# Patient Record
Sex: Female | Born: 1935 | Race: Black or African American | Hispanic: No | Smoking: Never smoker
Health system: Southern US, Community
[De-identification: ages and names within clinical notes are randomized; demographics above are authoritative.]

## PROBLEM LIST (undated history)

## (undated) DIAGNOSIS — R05 Cough: Secondary | ICD-10-CM

## (undated) DIAGNOSIS — R053 Chronic cough: Secondary | ICD-10-CM

## (undated) DIAGNOSIS — G459 Transient cerebral ischemic attack, unspecified: Secondary | ICD-10-CM

## (undated) DIAGNOSIS — I10 Essential (primary) hypertension: Secondary | ICD-10-CM

## (undated) DIAGNOSIS — E785 Hyperlipidemia, unspecified: Secondary | ICD-10-CM

## (undated) DIAGNOSIS — I4510 Unspecified right bundle-branch block: Secondary | ICD-10-CM

## (undated) DIAGNOSIS — N185 Chronic kidney disease, stage 5: Secondary | ICD-10-CM

## (undated) DIAGNOSIS — D649 Anemia, unspecified: Secondary | ICD-10-CM

## (undated) DIAGNOSIS — I480 Paroxysmal atrial fibrillation: Secondary | ICD-10-CM

## (undated) DIAGNOSIS — N186 End stage renal disease: Secondary | ICD-10-CM

## (undated) DIAGNOSIS — M1990 Unspecified osteoarthritis, unspecified site: Secondary | ICD-10-CM

## (undated) DIAGNOSIS — I272 Pulmonary hypertension, unspecified: Secondary | ICD-10-CM

## (undated) DIAGNOSIS — D303 Benign neoplasm of bladder: Secondary | ICD-10-CM

## (undated) DIAGNOSIS — I951 Orthostatic hypotension: Secondary | ICD-10-CM

## (undated) DIAGNOSIS — G4733 Obstructive sleep apnea (adult) (pediatric): Secondary | ICD-10-CM

## (undated) DIAGNOSIS — J189 Pneumonia, unspecified organism: Secondary | ICD-10-CM

## (undated) DIAGNOSIS — I4891 Unspecified atrial fibrillation: Secondary | ICD-10-CM

## (undated) DIAGNOSIS — Z992 Dependence on renal dialysis: Secondary | ICD-10-CM

## (undated) DIAGNOSIS — I639 Cerebral infarction, unspecified: Secondary | ICD-10-CM

## (undated) DIAGNOSIS — N3289 Other specified disorders of bladder: Secondary | ICD-10-CM

## (undated) DIAGNOSIS — I350 Nonrheumatic aortic (valve) stenosis: Secondary | ICD-10-CM

## (undated) HISTORY — DX: Hyperlipidemia, unspecified: E78.5

## (undated) HISTORY — PX: TOTAL ABDOMINAL HYSTERECTOMY: SHX209

## (undated) HISTORY — DX: Chronic cough: R05.3

## (undated) HISTORY — DX: Pulmonary hypertension, unspecified: I27.20

## (undated) HISTORY — DX: Other specified disorders of bladder: N32.89

## (undated) HISTORY — PX: NEPHRECTOMY: SHX65

## (undated) HISTORY — DX: Transient cerebral ischemic attack, unspecified: G45.9

## (undated) HISTORY — DX: Chronic kidney disease, stage 5: N18.5

## (undated) HISTORY — DX: End stage renal disease: N18.6

## (undated) HISTORY — DX: Essential (primary) hypertension: I10

## (undated) HISTORY — DX: Nonrheumatic aortic (valve) stenosis: I35.0

## (undated) HISTORY — DX: Unspecified atrial fibrillation: I48.91

## (undated) HISTORY — DX: Paroxysmal atrial fibrillation: I48.0

## (undated) HISTORY — DX: Obstructive sleep apnea (adult) (pediatric): G47.33

## (undated) HISTORY — DX: Anemia, unspecified: D64.9

## (undated) HISTORY — PX: OTHER SURGICAL HISTORY: SHX169

## (undated) HISTORY — DX: Unspecified right bundle-branch block: I45.10

## (undated) HISTORY — DX: Cough: R05

## (undated) HISTORY — DX: End stage renal disease: Z99.2

## (undated) HISTORY — DX: Cerebral infarction, unspecified: I63.9

## (undated) HISTORY — DX: Benign neoplasm of bladder: D30.3

## (undated) HISTORY — PX: BONE MARROW BIOPSY: SHX199

## (undated) HISTORY — DX: Unspecified osteoarthritis, unspecified site: M19.90

## (undated) HISTORY — PX: COLONOSCOPY W/ BIOPSIES AND POLYPECTOMY: SHX1376

## (undated) HISTORY — PX: CATARACT EXTRACTION W/ INTRAOCULAR LENS  IMPLANT, BILATERAL: SHX1307

---

## 1998-03-22 ENCOUNTER — Encounter: Payer: Self-pay | Admitting: Emergency Medicine

## 1998-03-22 ENCOUNTER — Emergency Department (HOSPITAL_COMMUNITY): Admission: EM | Admit: 1998-03-22 | Discharge: 1998-03-22 | Payer: Self-pay | Admitting: Emergency Medicine

## 1998-04-12 ENCOUNTER — Encounter: Payer: Self-pay | Admitting: Internal Medicine

## 1998-04-12 ENCOUNTER — Inpatient Hospital Stay (HOSPITAL_COMMUNITY): Admission: EM | Admit: 1998-04-12 | Discharge: 1998-04-17 | Payer: Self-pay | Admitting: Emergency Medicine

## 1998-04-13 ENCOUNTER — Encounter: Payer: Self-pay | Admitting: Emergency Medicine

## 1998-04-13 ENCOUNTER — Encounter: Payer: Self-pay | Admitting: Internal Medicine

## 1998-04-15 ENCOUNTER — Encounter: Payer: Self-pay | Admitting: Urology

## 1998-04-16 ENCOUNTER — Encounter: Payer: Self-pay | Admitting: Internal Medicine

## 1999-04-13 ENCOUNTER — Other Ambulatory Visit: Admission: RE | Admit: 1999-04-13 | Discharge: 1999-04-13 | Payer: Self-pay | Admitting: Internal Medicine

## 1999-10-01 ENCOUNTER — Encounter: Payer: Self-pay | Admitting: Internal Medicine

## 1999-10-01 ENCOUNTER — Encounter: Admission: RE | Admit: 1999-10-01 | Discharge: 1999-10-01 | Payer: Self-pay | Admitting: Internal Medicine

## 2000-11-01 ENCOUNTER — Encounter: Payer: Self-pay | Admitting: Internal Medicine

## 2000-11-01 ENCOUNTER — Encounter: Admission: RE | Admit: 2000-11-01 | Discharge: 2000-11-01 | Payer: Self-pay | Admitting: Internal Medicine

## 2001-04-16 ENCOUNTER — Emergency Department (HOSPITAL_COMMUNITY): Admission: EM | Admit: 2001-04-16 | Discharge: 2001-04-16 | Payer: Self-pay

## 2001-11-02 ENCOUNTER — Encounter: Payer: Self-pay | Admitting: Internal Medicine

## 2001-11-02 ENCOUNTER — Encounter: Admission: RE | Admit: 2001-11-02 | Discharge: 2001-11-02 | Payer: Self-pay | Admitting: Internal Medicine

## 2002-01-16 ENCOUNTER — Encounter: Admission: RE | Admit: 2002-01-16 | Discharge: 2002-01-16 | Payer: Self-pay | Admitting: Internal Medicine

## 2002-01-16 ENCOUNTER — Encounter: Payer: Self-pay | Admitting: Internal Medicine

## 2002-04-29 ENCOUNTER — Other Ambulatory Visit: Admission: RE | Admit: 2002-04-29 | Discharge: 2002-04-29 | Payer: Self-pay | Admitting: Internal Medicine

## 2002-05-16 ENCOUNTER — Encounter: Payer: Self-pay | Admitting: Internal Medicine

## 2002-05-16 ENCOUNTER — Encounter: Admission: RE | Admit: 2002-05-16 | Discharge: 2002-05-16 | Payer: Self-pay | Admitting: Internal Medicine

## 2002-07-03 ENCOUNTER — Ambulatory Visit (HOSPITAL_COMMUNITY): Admission: RE | Admit: 2002-07-03 | Discharge: 2002-07-03 | Payer: Self-pay | Admitting: Urology

## 2002-07-03 ENCOUNTER — Encounter: Payer: Self-pay | Admitting: Urology

## 2002-07-11 ENCOUNTER — Encounter (INDEPENDENT_AMBULATORY_CARE_PROVIDER_SITE_OTHER): Payer: Self-pay | Admitting: Specialist

## 2002-07-11 ENCOUNTER — Ambulatory Visit (HOSPITAL_BASED_OUTPATIENT_CLINIC_OR_DEPARTMENT_OTHER): Admission: RE | Admit: 2002-07-11 | Discharge: 2002-07-11 | Payer: Self-pay | Admitting: Urology

## 2003-08-12 ENCOUNTER — Encounter: Admission: RE | Admit: 2003-08-12 | Discharge: 2003-08-12 | Payer: Self-pay | Admitting: Internal Medicine

## 2004-12-13 ENCOUNTER — Encounter: Admission: RE | Admit: 2004-12-13 | Discharge: 2004-12-13 | Payer: Self-pay | Admitting: Internal Medicine

## 2004-12-13 ENCOUNTER — Ambulatory Visit: Payer: Self-pay

## 2004-12-15 ENCOUNTER — Encounter (INDEPENDENT_AMBULATORY_CARE_PROVIDER_SITE_OTHER): Payer: Self-pay | Admitting: *Deleted

## 2004-12-15 ENCOUNTER — Inpatient Hospital Stay (HOSPITAL_COMMUNITY): Admission: EM | Admit: 2004-12-15 | Discharge: 2004-12-25 | Payer: Self-pay | Admitting: Emergency Medicine

## 2004-12-23 ENCOUNTER — Ambulatory Visit: Payer: Self-pay | Admitting: Oncology

## 2004-12-23 ENCOUNTER — Encounter: Payer: Self-pay | Admitting: Cardiology

## 2004-12-23 ENCOUNTER — Ambulatory Visit: Payer: Self-pay | Admitting: Cardiology

## 2005-02-04 ENCOUNTER — Encounter (INDEPENDENT_AMBULATORY_CARE_PROVIDER_SITE_OTHER): Payer: Self-pay | Admitting: Specialist

## 2005-02-04 ENCOUNTER — Ambulatory Visit: Payer: Self-pay | Admitting: Oncology

## 2005-02-04 ENCOUNTER — Ambulatory Visit (HOSPITAL_COMMUNITY): Admission: RE | Admit: 2005-02-04 | Discharge: 2005-02-04 | Payer: Self-pay | Admitting: Oncology

## 2005-02-11 ENCOUNTER — Ambulatory Visit: Payer: Self-pay | Admitting: Oncology

## 2005-02-11 ENCOUNTER — Ambulatory Visit (HOSPITAL_COMMUNITY): Admission: RE | Admit: 2005-02-11 | Discharge: 2005-02-11 | Payer: Self-pay | Admitting: Vascular Surgery

## 2005-03-14 ENCOUNTER — Ambulatory Visit: Payer: Self-pay | Admitting: Cardiology

## 2005-03-28 ENCOUNTER — Ambulatory Visit: Payer: Self-pay

## 2005-04-04 ENCOUNTER — Ambulatory Visit: Payer: Self-pay

## 2005-05-11 ENCOUNTER — Ambulatory Visit: Payer: Self-pay | Admitting: Oncology

## 2005-05-23 ENCOUNTER — Ambulatory Visit (HOSPITAL_COMMUNITY): Admission: RE | Admit: 2005-05-23 | Discharge: 2005-05-23 | Payer: Self-pay | Admitting: Nephrology

## 2005-06-10 ENCOUNTER — Ambulatory Visit (HOSPITAL_COMMUNITY): Admission: RE | Admit: 2005-06-10 | Discharge: 2005-06-10 | Payer: Self-pay | Admitting: Vascular Surgery

## 2005-08-05 ENCOUNTER — Ambulatory Visit: Payer: Self-pay | Admitting: Oncology

## 2005-09-07 ENCOUNTER — Encounter: Payer: Self-pay | Admitting: Internal Medicine

## 2005-09-19 ENCOUNTER — Ambulatory Visit (HOSPITAL_COMMUNITY): Admission: RE | Admit: 2005-09-19 | Discharge: 2005-09-19 | Payer: Self-pay | Admitting: Nephrology

## 2005-10-10 ENCOUNTER — Ambulatory Visit (HOSPITAL_COMMUNITY): Admission: RE | Admit: 2005-10-10 | Discharge: 2005-10-10 | Payer: Self-pay | Admitting: Vascular Surgery

## 2005-11-02 ENCOUNTER — Ambulatory Visit: Payer: Self-pay | Admitting: Oncology

## 2006-01-23 ENCOUNTER — Encounter: Admission: RE | Admit: 2006-01-23 | Discharge: 2006-01-23 | Payer: Self-pay | Admitting: Nephrology

## 2006-03-03 ENCOUNTER — Ambulatory Visit: Payer: Self-pay | Admitting: Pulmonary Disease

## 2006-03-06 ENCOUNTER — Ambulatory Visit: Payer: Self-pay | Admitting: Pulmonary Disease

## 2006-04-03 ENCOUNTER — Ambulatory Visit: Payer: Self-pay | Admitting: Pulmonary Disease

## 2006-10-11 ENCOUNTER — Emergency Department (HOSPITAL_COMMUNITY): Admission: EM | Admit: 2006-10-11 | Discharge: 2006-10-11 | Payer: Self-pay | Admitting: Emergency Medicine

## 2006-10-13 ENCOUNTER — Ambulatory Visit: Payer: Self-pay | Admitting: Cardiology

## 2006-10-16 ENCOUNTER — Ambulatory Visit (HOSPITAL_COMMUNITY): Admission: RE | Admit: 2006-10-16 | Discharge: 2006-10-16 | Payer: Self-pay | Admitting: Nephrology

## 2006-10-31 ENCOUNTER — Encounter: Payer: Self-pay | Admitting: Cardiology

## 2006-10-31 ENCOUNTER — Ambulatory Visit: Payer: Self-pay

## 2006-10-31 ENCOUNTER — Ambulatory Visit: Payer: Self-pay | Admitting: Cardiology

## 2006-11-03 ENCOUNTER — Ambulatory Visit (HOSPITAL_COMMUNITY): Admission: RE | Admit: 2006-11-03 | Discharge: 2006-11-03 | Payer: Self-pay | Admitting: *Deleted

## 2006-11-03 ENCOUNTER — Ambulatory Visit: Payer: Self-pay | Admitting: *Deleted

## 2006-11-29 ENCOUNTER — Ambulatory Visit (HOSPITAL_COMMUNITY): Admission: RE | Admit: 2006-11-29 | Discharge: 2006-11-29 | Payer: Self-pay | Admitting: Nephrology

## 2006-12-05 ENCOUNTER — Emergency Department (HOSPITAL_COMMUNITY): Admission: EM | Admit: 2006-12-05 | Discharge: 2006-12-05 | Payer: Self-pay | Admitting: Emergency Medicine

## 2006-12-13 ENCOUNTER — Ambulatory Visit: Payer: Self-pay | Admitting: Vascular Surgery

## 2006-12-13 ENCOUNTER — Ambulatory Visit (HOSPITAL_COMMUNITY): Admission: RE | Admit: 2006-12-13 | Discharge: 2006-12-13 | Payer: Self-pay | Admitting: Vascular Surgery

## 2007-03-02 ENCOUNTER — Ambulatory Visit (HOSPITAL_COMMUNITY): Admission: RE | Admit: 2007-03-02 | Discharge: 2007-03-02 | Payer: Self-pay | Admitting: Nephrology

## 2007-03-23 ENCOUNTER — Encounter: Admission: RE | Admit: 2007-03-23 | Discharge: 2007-03-23 | Payer: Self-pay | Admitting: Internal Medicine

## 2007-07-21 ENCOUNTER — Emergency Department (HOSPITAL_COMMUNITY): Admission: EM | Admit: 2007-07-21 | Discharge: 2007-07-21 | Payer: Self-pay | Admitting: Family Medicine

## 2007-09-10 ENCOUNTER — Ambulatory Visit (HOSPITAL_COMMUNITY): Admission: RE | Admit: 2007-09-10 | Discharge: 2007-09-10 | Payer: Self-pay | Admitting: Nephrology

## 2007-10-06 ENCOUNTER — Ambulatory Visit (HOSPITAL_COMMUNITY): Admission: RE | Admit: 2007-10-06 | Discharge: 2007-10-06 | Payer: Self-pay | Admitting: Vascular Surgery

## 2007-10-06 ENCOUNTER — Ambulatory Visit: Payer: Self-pay | Admitting: Vascular Surgery

## 2007-10-17 ENCOUNTER — Encounter: Admission: RE | Admit: 2007-10-17 | Discharge: 2007-10-17 | Payer: Self-pay | Admitting: Nephrology

## 2008-02-06 ENCOUNTER — Ambulatory Visit: Payer: Self-pay | Admitting: Pulmonary Disease

## 2008-02-06 DIAGNOSIS — R011 Cardiac murmur, unspecified: Secondary | ICD-10-CM

## 2008-02-06 DIAGNOSIS — R05 Cough: Secondary | ICD-10-CM

## 2008-02-07 DIAGNOSIS — G459 Transient cerebral ischemic attack, unspecified: Secondary | ICD-10-CM | POA: Insufficient documentation

## 2008-02-07 DIAGNOSIS — D638 Anemia in other chronic diseases classified elsewhere: Secondary | ICD-10-CM

## 2008-02-07 DIAGNOSIS — E785 Hyperlipidemia, unspecified: Secondary | ICD-10-CM

## 2008-02-07 DIAGNOSIS — N189 Chronic kidney disease, unspecified: Secondary | ICD-10-CM | POA: Insufficient documentation

## 2008-02-15 ENCOUNTER — Encounter: Payer: Self-pay | Admitting: Pulmonary Disease

## 2008-02-15 ENCOUNTER — Ambulatory Visit: Payer: Self-pay

## 2008-02-20 ENCOUNTER — Ambulatory Visit: Payer: Self-pay | Admitting: Pulmonary Disease

## 2008-02-20 ENCOUNTER — Telehealth (INDEPENDENT_AMBULATORY_CARE_PROVIDER_SITE_OTHER): Payer: Self-pay

## 2008-02-25 ENCOUNTER — Encounter: Payer: Self-pay | Admitting: Pulmonary Disease

## 2008-03-02 ENCOUNTER — Encounter: Payer: Self-pay | Admitting: Pulmonary Disease

## 2008-03-02 ENCOUNTER — Ambulatory Visit (HOSPITAL_BASED_OUTPATIENT_CLINIC_OR_DEPARTMENT_OTHER): Admission: RE | Admit: 2008-03-02 | Discharge: 2008-03-02 | Payer: Self-pay | Admitting: Pulmonary Disease

## 2008-03-05 ENCOUNTER — Ambulatory Visit: Payer: Self-pay | Admitting: Pulmonary Disease

## 2008-03-13 ENCOUNTER — Ambulatory Visit: Payer: Self-pay | Admitting: Pulmonary Disease

## 2008-03-19 ENCOUNTER — Ambulatory Visit: Payer: Self-pay | Admitting: Pulmonary Disease

## 2008-03-19 DIAGNOSIS — G4733 Obstructive sleep apnea (adult) (pediatric): Secondary | ICD-10-CM

## 2008-03-24 ENCOUNTER — Encounter: Admission: RE | Admit: 2008-03-24 | Discharge: 2008-03-24 | Payer: Self-pay | Admitting: Internal Medicine

## 2008-03-26 ENCOUNTER — Ambulatory Visit (HOSPITAL_COMMUNITY): Admission: RE | Admit: 2008-03-26 | Discharge: 2008-03-26 | Payer: Self-pay | Admitting: Nephrology

## 2008-03-30 ENCOUNTER — Ambulatory Visit: Payer: Self-pay | Admitting: Surgery

## 2008-03-30 ENCOUNTER — Emergency Department (HOSPITAL_COMMUNITY): Admission: EM | Admit: 2008-03-30 | Discharge: 2008-03-30 | Payer: Self-pay | Admitting: Emergency Medicine

## 2008-04-21 ENCOUNTER — Ambulatory Visit: Payer: Self-pay | Admitting: Surgery

## 2008-04-25 ENCOUNTER — Ambulatory Visit (HOSPITAL_COMMUNITY): Admission: RE | Admit: 2008-04-25 | Discharge: 2008-04-25 | Payer: Self-pay | Admitting: Surgery

## 2008-06-09 ENCOUNTER — Ambulatory Visit: Payer: Self-pay | Admitting: Surgery

## 2008-07-14 ENCOUNTER — Ambulatory Visit: Payer: Self-pay | Admitting: Surgery

## 2008-07-16 ENCOUNTER — Ambulatory Visit (HOSPITAL_COMMUNITY): Admission: RE | Admit: 2008-07-16 | Discharge: 2008-07-16 | Payer: Self-pay | Admitting: Surgery

## 2008-07-16 ENCOUNTER — Ambulatory Visit: Payer: Self-pay | Admitting: Surgery

## 2008-08-18 ENCOUNTER — Ambulatory Visit: Payer: Self-pay | Admitting: Surgery

## 2008-08-22 ENCOUNTER — Ambulatory Visit: Payer: Self-pay | Admitting: Surgery

## 2008-08-22 ENCOUNTER — Ambulatory Visit (HOSPITAL_COMMUNITY): Admission: RE | Admit: 2008-08-22 | Discharge: 2008-08-22 | Payer: Self-pay | Admitting: Surgery

## 2008-09-15 ENCOUNTER — Ambulatory Visit: Payer: Self-pay | Admitting: Surgery

## 2008-10-13 ENCOUNTER — Ambulatory Visit: Payer: Self-pay | Admitting: Surgery

## 2008-10-27 IMAGING — CT CT HEAD W/O CM
1 series · 16 of 30 positions shown, 20 images · IV contrast (agent unspecified)
Comparison: None.

CLINICAL DATA: Left frontal headache status post fall yesterday.
 HEAD CT WITHOUT CONTRAST:
TECHNIQUE: Contiguous axial images were obtained from the base of the skull through the vertex according to standard protocol without contrast.

[Series 2: head_seq 4.5 h45s st · axial · 0.43mm/px · z∈[-147,-21]mm · 16 of 32 slices shown, 20 images]
[im 2/32  brain]
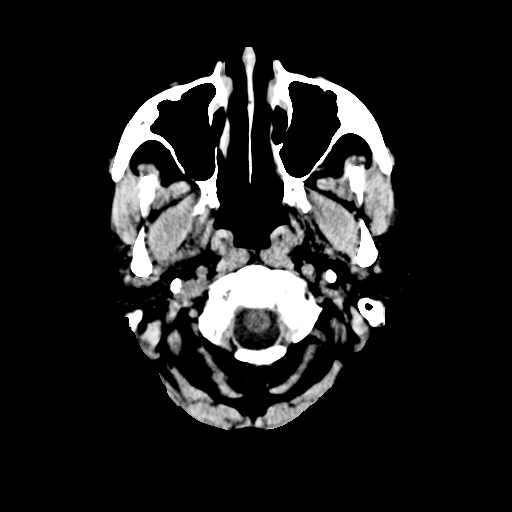
[im 2/32  bone]
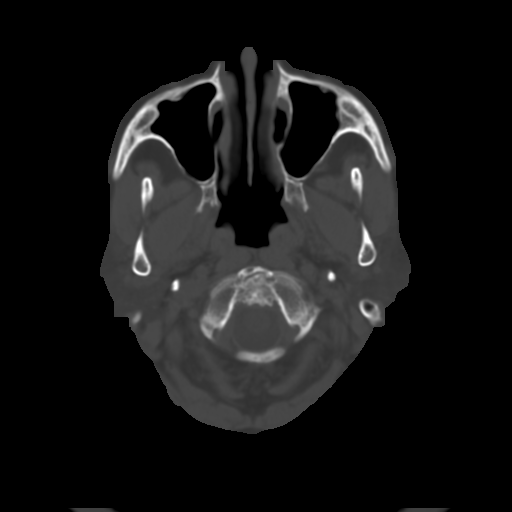
[im 4/32  brain]
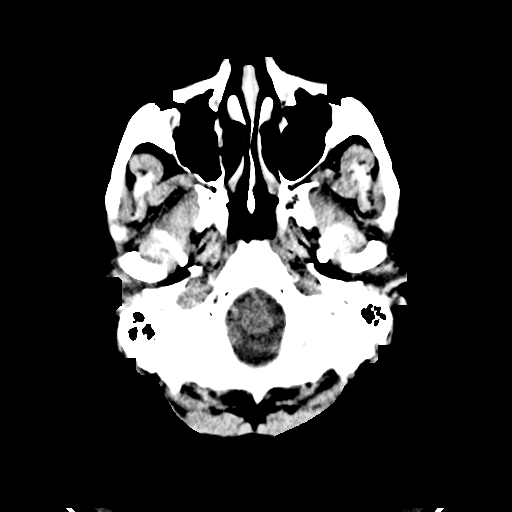
[im 6/32  brain]
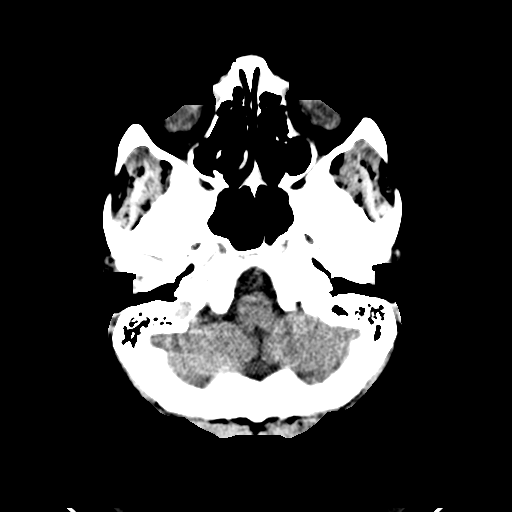
[im 8/32  brain]
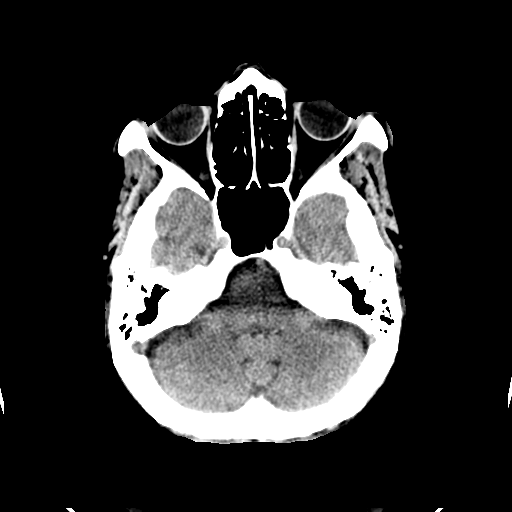
[im 9/32  brain]
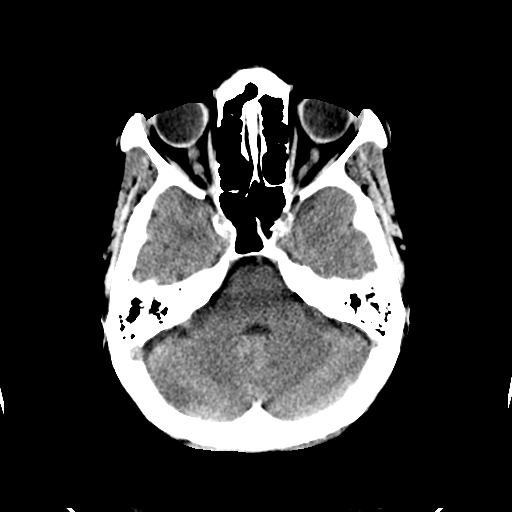
[im 9/32  bone]
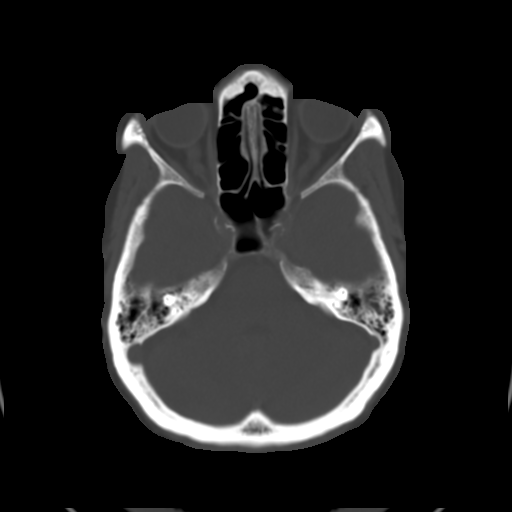
[im 11/32  brain]
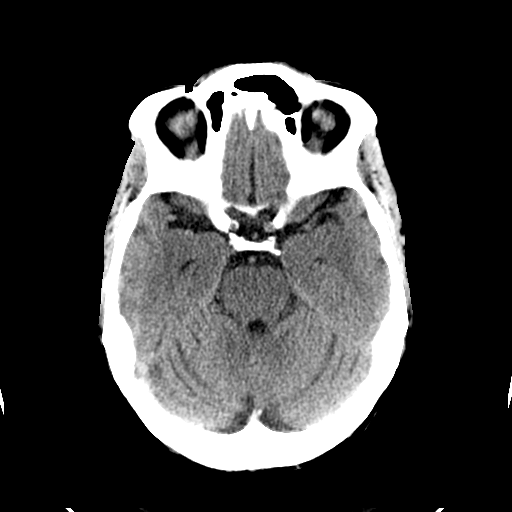
[im 13/32  brain]
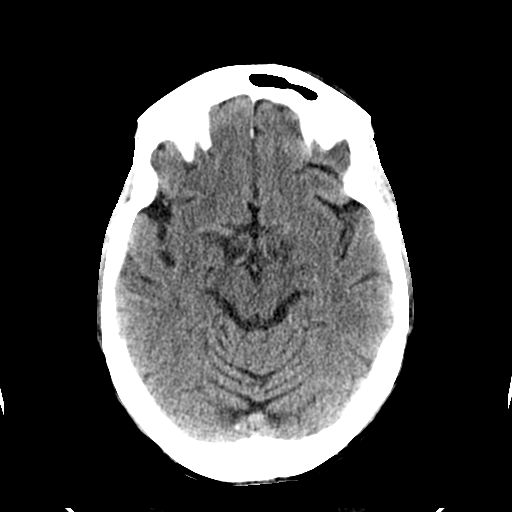
[im 15/32  brain]
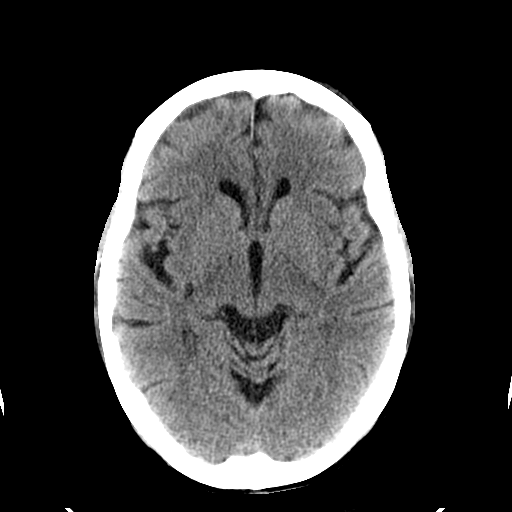
[im 17/32  brain]
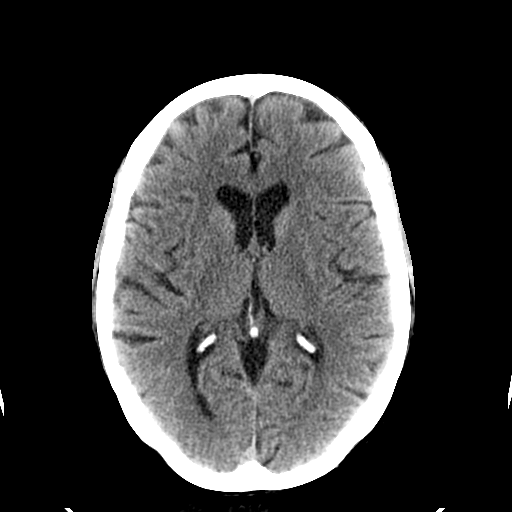
[im 17/32  bone]
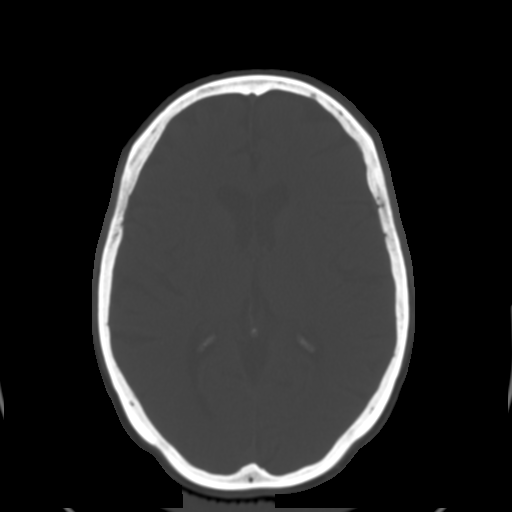
[im 19/32  brain]
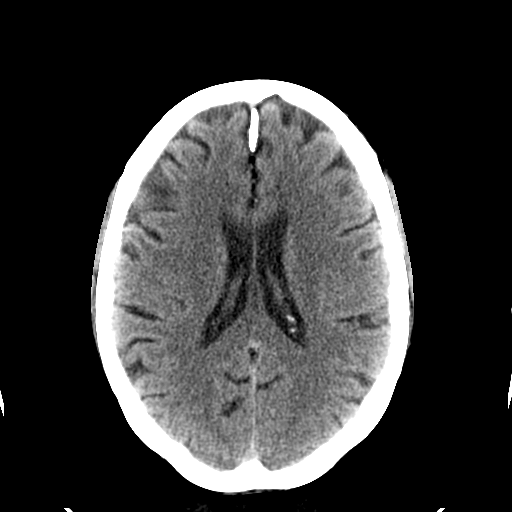
[im 21/32  brain]
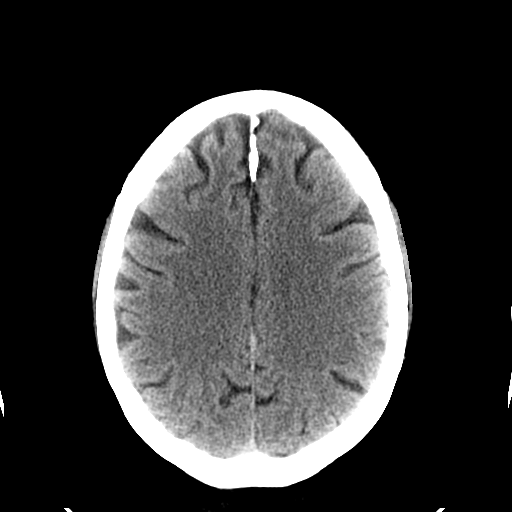
[im 23/32  brain]
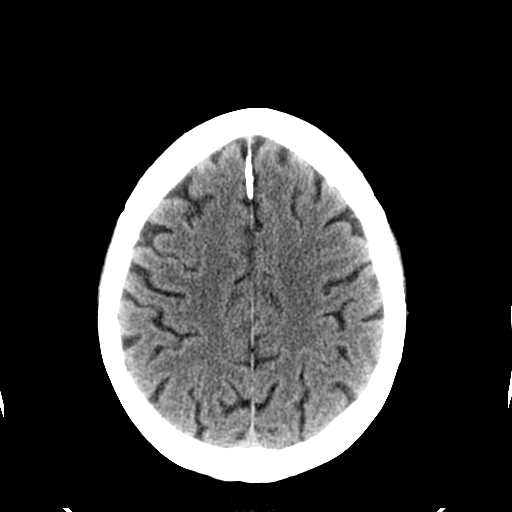
[im 24/32  brain]
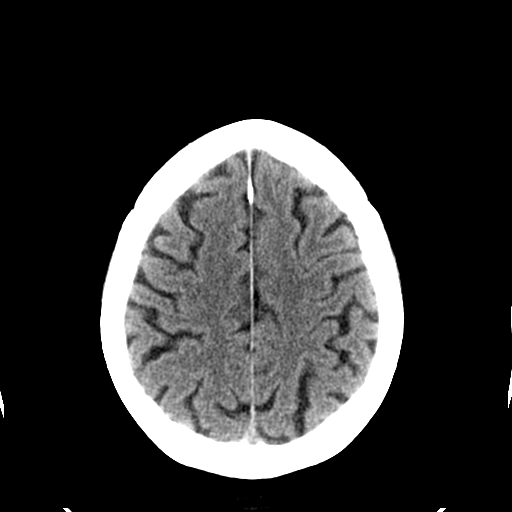
[im 24/32  bone]
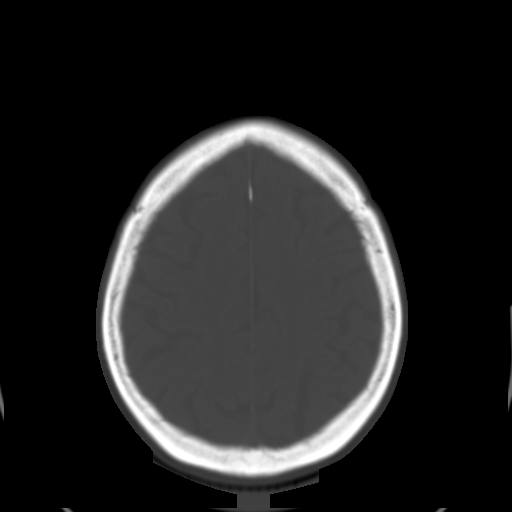
[im 26/32  brain]
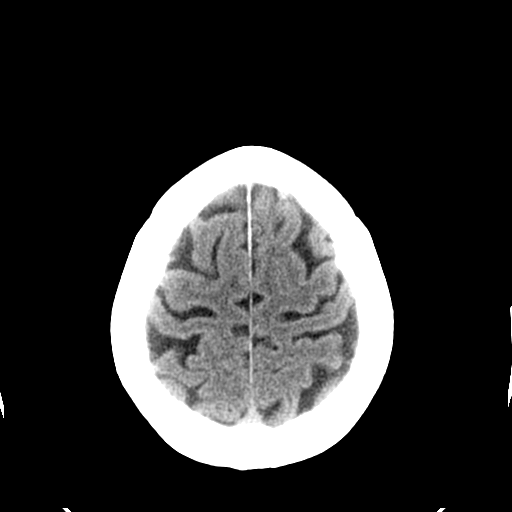
[im 28/32  brain]
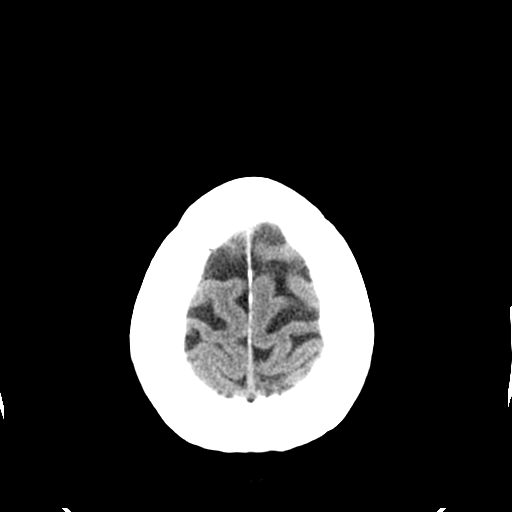
[im 30/32  brain]
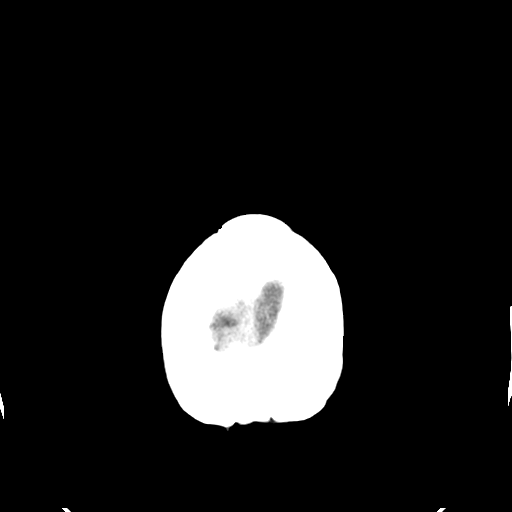

[16 of 30 positions shown; findings below may reference images not displayed]

FINDINGS: There appears to be some soft tissue swelling in the left frontotemporal scalp.  No acute intracranial hemorrhage, mass effect, or extraaxial fluid collections present.  There is mild generalized atrophy.  There is a possible small lacune or perivascular space in the right external capsule. No cortical stroke is demonstrated.  The calvarium is intact and the visualized paranasal sinuses are clear.
IMPRESSION: 1.  No evidence of acute intracranial or calvarial injury.
 2.  Left scalp soft tissue swelling.

## 2008-11-01 IMAGING — XA IR VENTRICULOPERITONEALSHUNT EVAL/INJ
1 series · 13 of 24 positions shown · non-contrast
Comparison: none

CLINICAL DATA: Decreased access flows.
LEFT UPPER EXTREMITY FISTULOGRAM:
PTA PROCEDURE:
Medications:  None. 
Procedure:  Informed consent was obtained.  The left upper extremity fistula was found to be patent by palpation.  It was accessed using 18-gauge angiocatheter towards the central venous structures.  Venogram images were obtained.

[Series 1: run · 13 of 57 slices shown]
[im 1/57]
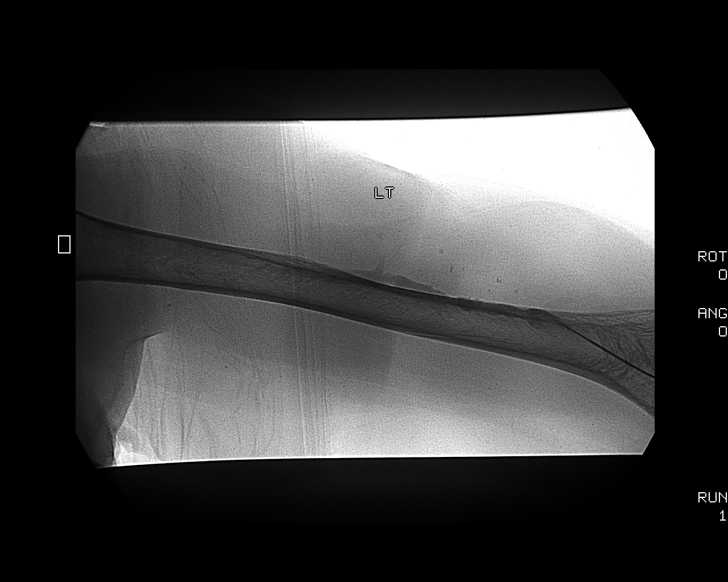
[im 5/57]
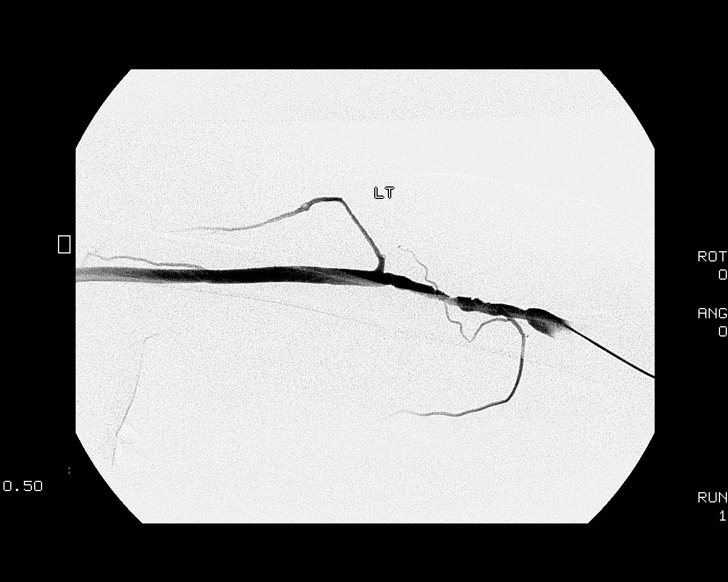
[im 10/57]
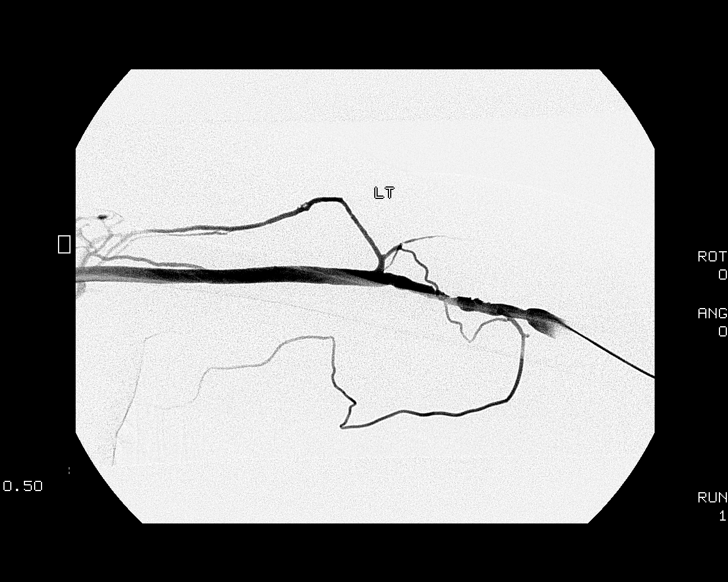
[im 15/57]
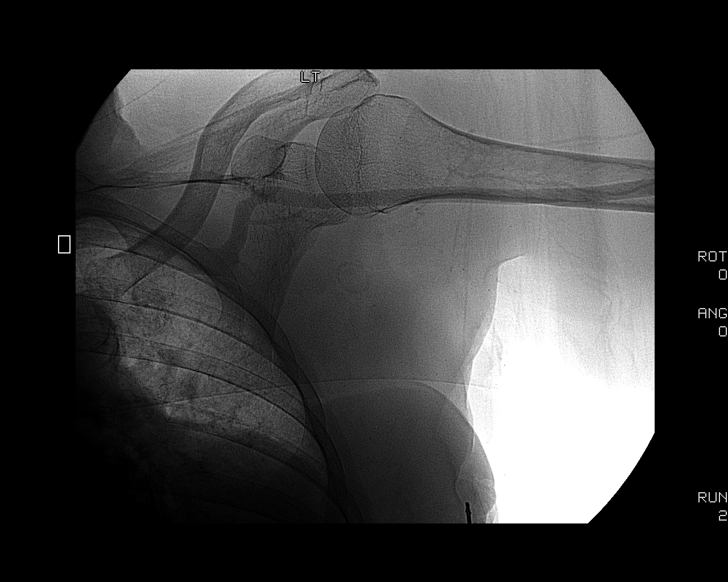
[im 20/57]
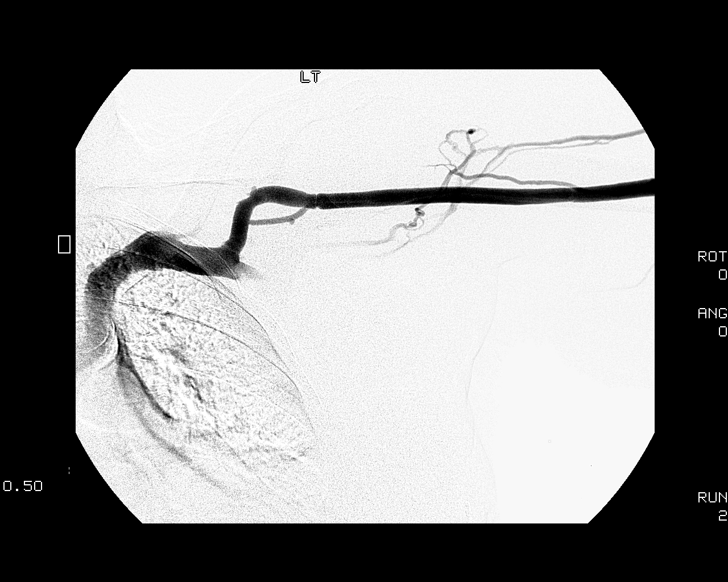
[im 25/57]
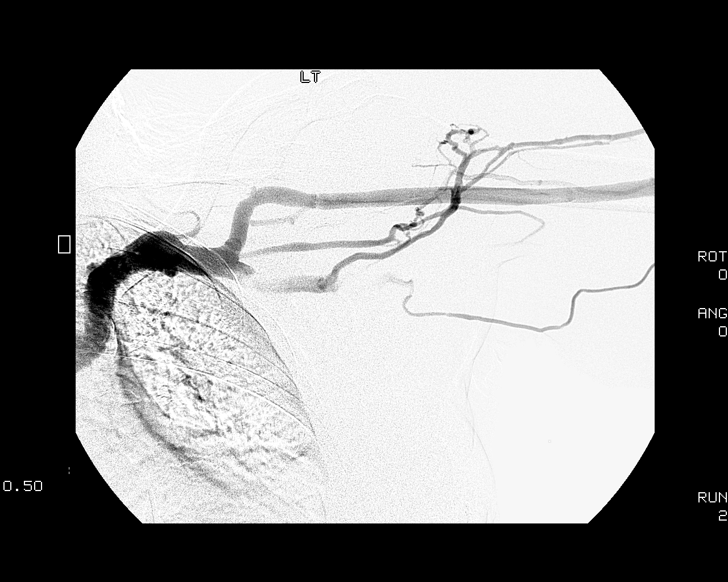
[im 30/57]
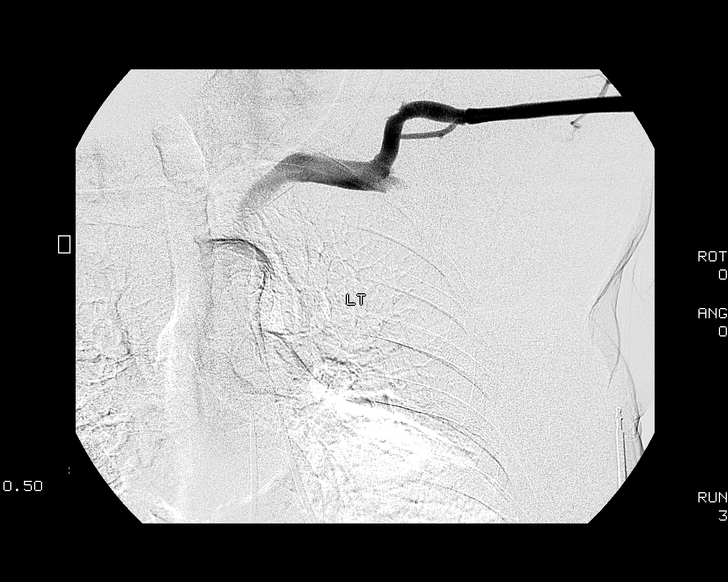
[im 32/57]
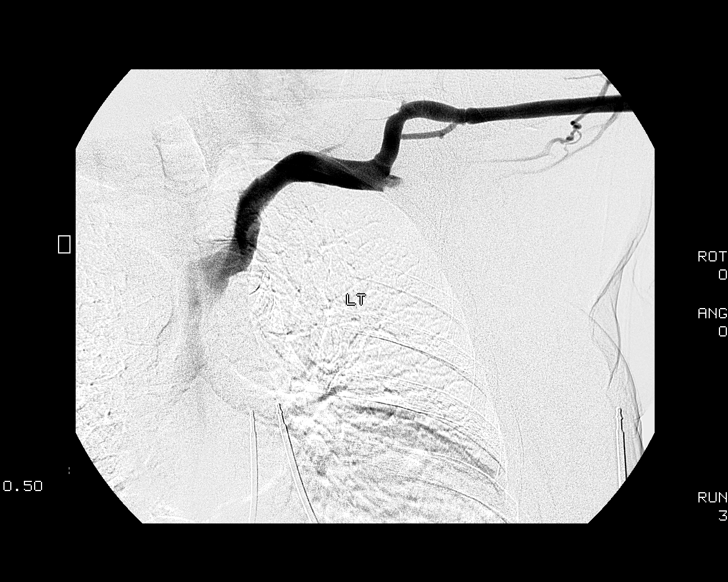
[im 37/57]
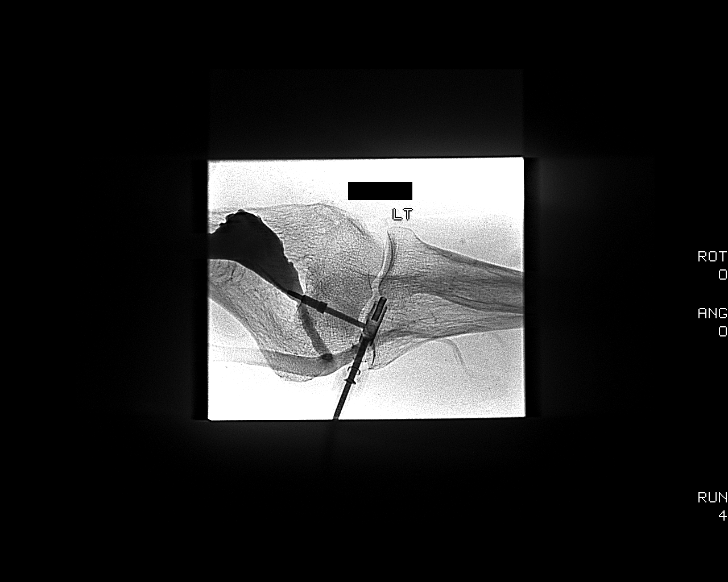
[im 42/57]
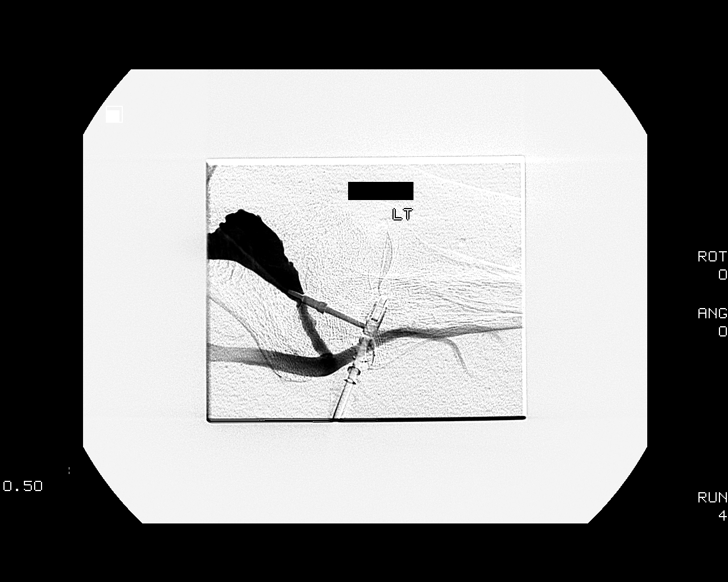
[im 47/57]
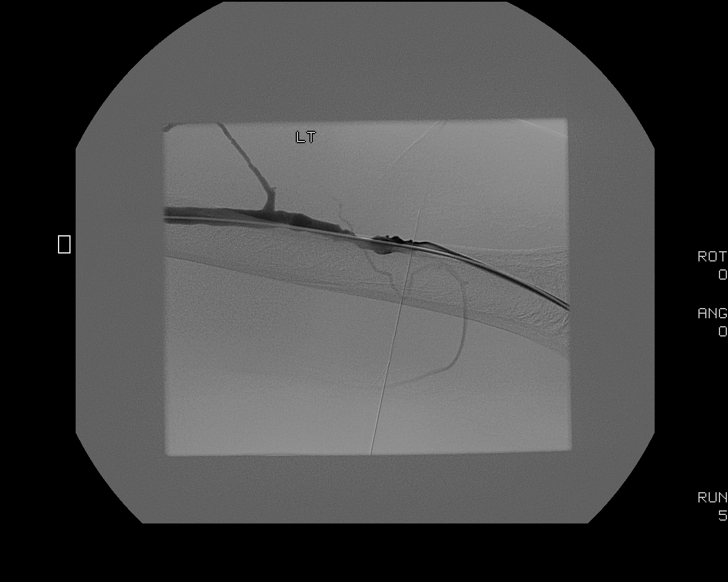
[im 52/57]
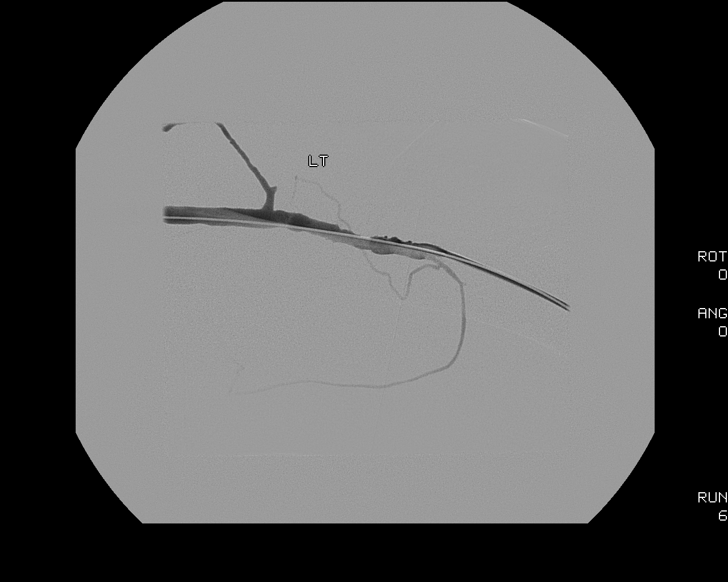
[im 57/57]
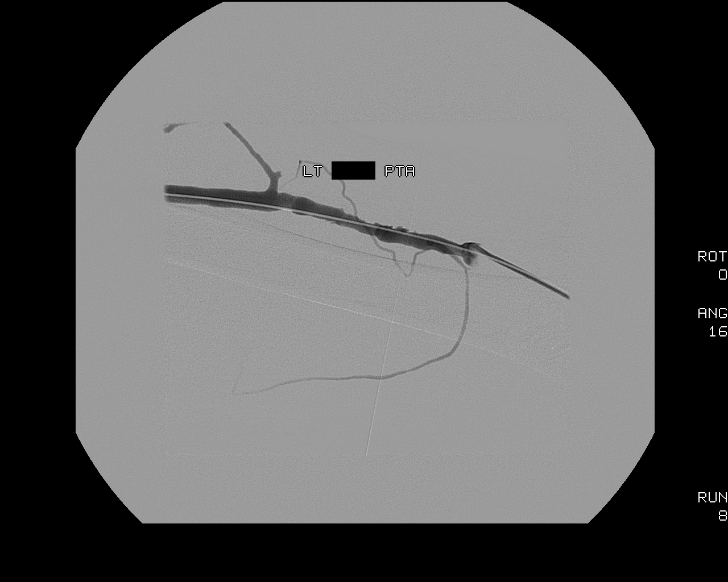

[13 of 24 positions shown; findings below may reference images not displayed]

FINDINGS: The left cephalic vein was patent, but there was an area of irregularity in the mid arm.  This was thought to be a mild to moderate stenosis, but due to the patient's poor access flow, this was thought to be a source of the dialysis problems.  The central venous structures were otherwise patent.  The fistula appeared to be widely patent.  There was a small aneurysmal formation a few centimeters beyond the anastomosis.  The initial access was upsized to a 5-French vascular sheath, and the area of irregularity and narrowing was angioplastied using a 5-mm Dorado balloon.  Follow-up images demonstrated improved flow through this vein.  The vascular sheath was removed using manual compression and a V+Pad.
IMPRESSION: Moderate narrowing and irregularity involving the cephalic vein in the mid arm.  This area was successfully treated with a 5-mm angioplasty balloon, and there was improved flow at the end of the procedure.

## 2008-11-26 ENCOUNTER — Ambulatory Visit: Payer: Self-pay | Admitting: Vascular Surgery

## 2008-12-05 ENCOUNTER — Ambulatory Visit (HOSPITAL_COMMUNITY): Admission: RE | Admit: 2008-12-05 | Discharge: 2008-12-05 | Payer: Self-pay | Admitting: Vascular Surgery

## 2008-12-05 ENCOUNTER — Ambulatory Visit: Payer: Self-pay | Admitting: Vascular Surgery

## 2008-12-22 ENCOUNTER — Ambulatory Visit (HOSPITAL_COMMUNITY): Admission: RE | Admit: 2008-12-22 | Discharge: 2008-12-22 | Payer: Self-pay | Admitting: Vascular Surgery

## 2009-02-02 ENCOUNTER — Ambulatory Visit: Payer: Self-pay | Admitting: Vascular Surgery

## 2009-02-02 ENCOUNTER — Ambulatory Visit (HOSPITAL_COMMUNITY): Admission: RE | Admit: 2009-02-02 | Discharge: 2009-02-02 | Payer: Self-pay | Admitting: Vascular Surgery

## 2009-02-10 ENCOUNTER — Ambulatory Visit (HOSPITAL_COMMUNITY): Admission: RE | Admit: 2009-02-10 | Discharge: 2009-02-10 | Payer: Self-pay | Admitting: Vascular Surgery

## 2009-02-11 ENCOUNTER — Ambulatory Visit (HOSPITAL_COMMUNITY): Admission: RE | Admit: 2009-02-11 | Discharge: 2009-02-11 | Payer: Self-pay | Admitting: Vascular Surgery

## 2009-04-20 ENCOUNTER — Encounter: Admission: RE | Admit: 2009-04-20 | Discharge: 2009-04-20 | Payer: Self-pay | Admitting: Internal Medicine

## 2009-05-04 ENCOUNTER — Encounter: Admission: RE | Admit: 2009-05-04 | Discharge: 2009-05-04 | Payer: Self-pay | Admitting: Internal Medicine

## 2010-02-17 ENCOUNTER — Ambulatory Visit (HOSPITAL_COMMUNITY): Admission: RE | Admit: 2010-02-17 | Discharge: 2010-02-17 | Payer: Self-pay | Admitting: Nephrology

## 2010-03-05 ENCOUNTER — Ambulatory Visit: Payer: Self-pay | Admitting: Internal Medicine

## 2010-03-26 ENCOUNTER — Ambulatory Visit: Payer: Self-pay | Admitting: Pulmonary Disease

## 2010-03-26 ENCOUNTER — Encounter: Payer: Self-pay | Admitting: Pulmonary Disease

## 2010-03-30 ENCOUNTER — Encounter: Payer: Self-pay | Admitting: Pulmonary Disease

## 2010-05-30 ENCOUNTER — Encounter: Payer: Self-pay | Admitting: Nephrology

## 2010-05-30 ENCOUNTER — Encounter: Payer: Self-pay | Admitting: Internal Medicine

## 2010-06-08 NOTE — Assessment & Plan Note (Signed)
Summary: rov/ mbw   Copy to:  Dr. Donato Heinz Primary Provider/Referring Provider:  Dr. [name]  CC:  followup on pfts, pt c/o dry cough no production  , no sob, and Back Pain.  History of Present Illness: 77 yo female with chronic cough.  She has persistence of her cough.  She tried flovent, but stopped.  She did not feel like this helped.    She feels the cough comes from her throat.  She does not have much sputum, nad denies hemoptysis.  She denies wheeze, fever, globus, sinus congestion, or GERD.  She does get some mucus in her throat at night.  PFT 03/26/10: FEV1 1.28(65%), FEV1% 71, positive bronchodilator response, TLC 3.79 (69%), DLCO 63%.  CXR  Procedure date:  03/05/2010  Findings:      CHEST - 2 VIEW   Comparison: [facility] [address] x-ray 10/17/2007, [facility] portable chest x-ray 03/30/2008 and two-view chest x-ray 04/25/2008.   Findings: Interval dialysis catheter removed from right IJ.  Stable slight cardiomegaly with left ventricular predominant configuration is seen.  No change in lateral left basilar linear scarring.  Lungs are otherwise clear.  Calcified thoracic aorta of normal caliber stable.  No change in degenerative findings dorsal spine. Mediastinum, hila, pleura are stable and unremarkable.   IMPRESSION:   1.  Interval dialysis catheter removal from right IJ since 04/25/2008. 2.  Stable slight cardiomegaly with left ventricular predominant configuration. 3.  No active cardiopulmonary disease.    Current Medications (verified): 1)  Lipitor 10 Mg Tabs (Atorvastatin Calcium) .... One Tablet At Bedtime 2)  Adult Aspirin Ec Low Strength 81 Mg Tbec (Aspirin) .... Take One Tablet By Mouth Daily. 3)  Proair Hfa 108 (90 Base) Mcg/act  Aers (Albuterol Sulfate) .Marland Kitchen.. 1-2 Puffs Every 4-6 Hours As Needed 4)  Flovent Hfa 110 Mcg/act Aero (Fluticasone Propionate  Hfa) .... Two Puffs Two Times A Day 5)  Multivitamins   Tabs (Multiple Vitamin) .... Take 1 Tablet By Mouth Once A Day 6)  Phoslo 667 Mg Caps (Calcium Acetate (Phos Binder)) .... Take 2  Tablet By Mouth Two Times A Day  Allergies: 1)  ! Sulfa  Past History:  Past Medical History: HTN ESRD on HD Mild Aortic Stenosis Mild Mitral Regurgitation Dyslipidemia TIA Gout Benign bladder mass DJD Anemia Mild OSA, PSG 03/02/08 AHI 6.4 Chronic cough      - PFT 03/26/10: FEV1 1.28 (65%), FEV1% 71, TLC 3.79 (69%), DLCO 63%  Past Surgical History: Reviewed history from 02/06/2008 and no changes required. left nephrectomy TAH Left arm dialysis graft Bone marrow biopsy  Vital Signs:  Patient profile:   77 year old female Height:      68 inches Weight:      158 pounds BMI:     24.11 O2 Sat:      95 % on Room air Temp:     98.3 degrees F oral Pulse rate:   82 / minute BP sitting:   140 / 56  (right arm) Cuff size:   regular  Vitals Entered By: [place] (March 26, 2010 3:56 PM)  O2 Flow:  Room air CC: followup on pfts, pt c/o dry cough no production  , no sob, Back Pain Comments med reviewed  pharmacy verfied    Physical Exam  General:  normal appearance and thin.   Nose:  clear nasal discharge, prominent turbinates Mouth:  Melampatti Class IV, scalloped tongue borders, decreased AP diameter of oropharynx.  Neck:  no masses, thyromegaly, or abnormal cervical nodes Lungs:  clear bilaterally to auscultation and percussion Heart:  regular rhythm, normal rate,  with 2/6 systolic Extremities:  no clubbing, cyanosis, edema, or deformity noted Neurologic:  normal CN II-XII and strength normal.   Cervical Nodes:  no significant adenopathy Psych:  alert and cooperative; normal mood and affect; normal attention span and concentration   Impression & Recommendations:  Problem # 1:  COUGH (ICD-786.2) She has persistent cough.  She has restrictive defect on pulmonary function test, but her recent chest xray was unremarkable for  interstitial lung disease.  She has pulmonary function test findings consistent with airflow obstruction and bronchodilator response.  I will try her on symbicort and continue as needed albuterol.  I have advised her to try using salt water gargles.  She can also try tramadol as a cough suppressant as needed.  Medications Added to Medication List This Visit: 1)  Phoslo 667 Mg Caps (Calcium acetate (phos binder)) .... Take 2  tablet by mouth two times a day 2)  Symbicort 160-4.5 Mcg/act Aero (Budesonide-formoterol fumarate) .... Two puffs two times a day 3)  Tramadol Hcl 50 Mg Tabs (Tramadol hcl) .... One by mouth two times a day as needed  Complete Medication List: 1)  Lipitor 10 Mg Tabs (Atorvastatin calcium) .... One tablet at bedtime 2)  Adult Aspirin Ec Low Strength 81 Mg Tbec (Aspirin) .... Take one tablet by mouth daily. 3)  Proair Hfa 108 (90 Base) Mcg/act Aers (Albuterol sulfate) .Marland Kitchen.. 1-2 puffs every 4-6 hours as needed 4)  Multivitamins Tabs (Multiple vitamin) .... Take 1 tablet by mouth once a day 5)  Phoslo 667 Mg Caps (Calcium acetate (phos binder)) .... Take 2  tablet by mouth two times a day 6)  Symbicort 160-4.5 Mcg/act Aero (Budesonide-formoterol fumarate) .... Two puffs two times a day 7)  Tramadol Hcl 50 Mg Tabs (Tramadol hcl) .... One by mouth two times a day as needed  Other Orders: Est. Patient Level IV [id number])  Patient Instructions: 1)  Stop flovent 2)  Symbicort two puffs two times a day, rinse mouth after using 3)  Proair two puffs up to four times per day as needed 4)  Tramadol 50 mg two times a day as needed for cough 5)  Salt water gargle two times a day as needed  6)  Follow up in 3 to 4 weeks Prescriptions: TRAMADOL HCL 50 MG TABS (TRAMADOL HCL) one by mouth two times a day as needed  #60 x 1   Entered and Authorized by:   Chesley Mires MD   Signed by:   Chesley Mires MD on 03/26/2010   Method used:   Electronically to        [facility] (retail)        [address], [place]  [id number]       Ph: [phone]       Fax: [phone]   RxID:   [id number] SYMBICORT 160-4.5 MCG/ACT AERO (BUDESONIDE-FORMOTEROL FUMARATE) two puffs two times a day  #1 x 3   Entered and Authorized by:   Chesley Mires MD   Signed by:   Chesley Mires MD on 03/26/2010   Method used:   Electronically to        [facility] (retail)       [address], [place]  [id number]  Ph: [phone]       Fax: [phone]   RxIDXB:[id number]

## 2010-06-08 NOTE — Assessment & Plan Note (Signed)
Summary: cough///kp   Visit Type:  Follow-up Copy to:  [name] Primary Provider/Referring Provider:  Dr. Zada Porter  CC:  [name] pt and her for acute visit for dry cough that has gotten worse over last few  months. .  History of Present Illness:   77 year old female - ESRD on Dialysis but still very functional (driving).  Per records, last seen [name] Nov 2009 foru cough that was considered due to asthma and postnasal drainage (see pft Oct 2009 in [facility] - ? restriction iwht mild reduction in DLCO). Started her on flovent and nasonex. After that she has nto returned here for followup for 2 years for no clear reason. States that she has been taking inhalers on and off only but past 1 month taking flovent daily. Here today becuse still having cough. Past 3 months increased cough intensity and duration. Cough is dry during day. She feels it is coming from throat. At night brings out white mucus. Unclear what precipiates cough. Not related to dialysis.  No relieving factors. Rates cough as severe.  Occ assoicated with wheeze. No associated hemoptysis. Denies associated dyspnea, chest pain, edema, fever, weight loss, chills or baselinechange in sleeping in recliner.   Preventive Screening-Counseling & Management  Alcohol-Tobacco     Smoking Status: never  Current Medications (verified): 1)  Lipitor 10 Mg Tabs (Atorvastatin Calcium) .... One Tablet At Bedtime 2)  Adult Aspirin Ec Low Strength 81 Mg Tbec (Aspirin) .... Take One Tablet By Mouth Daily. 3)  Proair Hfa 108 (90 Base) Mcg/act  Aers (Albuterol Sulfate) .Marland Kitchen.. 1-2 Puffs Every 4-6 Hours As Needed 4)  Flovent Hfa 110 Mcg/act Aero (Fluticasone Propionate  Hfa) .... Two Puffs Two Times A Day 5)  Multivitamins  Tabs (Multiple Vitamin) .... Take 1 Tablet By Mouth Once A Day 6)  Phoslo 667 Mg Caps (Calcium Acetate (Phos Binder)) .... Take 1 Tablet By Mouth Once A Day  Allergies (verified): 1)  ! Sulfa  Past History:  Past  medical, surgical, family and social histories (including risk factors) reviewed, and no changes noted (except as noted below).  Past Medical History: HTN ESRD on HD Mild Aortic Stenosis Mild Mitral Regurgitation Dyslipidemia TIA Gout Benign bladder mass DJD Anemia Mild OSA, PSG 03/02/08 AHI 6.4 PFTs - Fev1 1.43L/71%, Ratio 77, TLC 93%, DLCO 73%  Past Surgical History: Reviewed history from 02/06/2008 and no changes required. left nephrectomy TAH Left arm dialysis graft Bone marrow biopsy  Family History: Reviewed history from 02/06/2008 and no changes required. Mother - HTN, DM  Social History: Reviewed history from 02/06/2008 and no changes required. Widowed  Retired Chartered certified accountant. Patient never smoked.   Review of Systems       The patient complains of non-productive cough and joint stiffness or pain.  The patient denies shortness of breath with activity, shortness of breath at rest, productive cough, coughing up blood, chest pain, irregular heartbeats, acid heartburn, indigestion, loss of appetite, weight change, abdominal pain, difficulty swallowing, sore throat, tooth/dental problems, headaches, nasal congestion/difficulty breathing through nose, sneezing, itching, ear ache, anxiety, depression, hand/feet swelling, rash, change in color of mucus, and fever.    Vital Signs:  Patient profile:   77 year old female Height:      68 inches Weight:      159 pounds BMI:     24.26 O2 Sat:      94 % on Room air Temp:     98.1 degrees F oral Pulse  rate:   72 / minute BP sitting:   118 / 70  (right arm) Cuff size:   regular  Vitals Entered By: [name] (March 05, 2010 2:14 PM)  O2 Flow:  Room air CC: [name] pt, her for acute visit for dry cough that has gotten worse over last few  months.  Comments Medications reviewed with patient [name]  March 05, 2010 2:22 PM Daytime phone number verified with patient.    Physical  Exam  General:  normal appearance and thin.   Eyes:  PERRLA and EOMI.   Ears:  canal impacted w/cerumen.   Nose:  clear nasal discharge, prominent turbinates Mouth:  Melampatti Class IV, scalloped tongue borders, decreased AP diameter of oropharynx. Neck:  no masses, thyromegaly, or abnormal cervical nodes Lungs:  clear bilaterally to auscultation and percussion Heart:  regular rhythm, normal rate,  with 2/6 systolic Abdomen:  bowel sounds positive; abdomen soft and non-tender without masses, or organomegaly Pulses:  pulses normal Extremities:  no clubbing, cyanosis, edema, or deformity noted Neurologic:  normal strength, no focal deficits   CXR  Procedure date:  04/25/2008  Findings:      looks clear  Impression & Recommendations:  Problem # 1:  COUGH (ICD-786.2) Assessment Deteriorated unclear if she has really asthma. PFTs ? restriction. Also, no improvement with flovent. In fact worse though compliance is questionable. Unclear what true etioogy is. Denies post nasal drainage or GERD. Will get CXR and PFT and have her return and see [name]. She wants a quick fix. Explained that she/we need to be patient in approach to managment of cough Orders: T-2 View CXR ([id number]) Pulmonary Referral (Pulmonary) Est. Patient Level IV [id number])  Medications Added to Medication List This Visit: 1)  Multivitamins Tabs (Multiple vitamin) .... Take 1 tablet by mouth once a day 2)  Phoslo 667 Mg Caps (Calcium acetate (phos binder)) .... Take 1 tablet by mouth once a day  Patient Instructions: 1)  please hvae cxr 2)  please do full pft 3)  after that return to see [name]   Immunization History:  Influenza Immunization History:    Influenza:  historical (02/09/2009)  Pneumovax Immunization History:    Pneumovax:  historical (02/11/2008)

## 2010-06-08 NOTE — Miscellaneous (Signed)
Summary: Orders Update pft charges  Clinical Lists Changes  Orders: Added new Service order of Carbon Monoxide diffusing w/capacity (94720) - Signed Added new Service order of Lung Volumes (94240) - Signed Added new Service order of Spirometry (Pre & Post) (94060) - Signed 

## 2010-06-08 NOTE — Miscellaneous (Signed)
Summary: Pulmonary function test   Pulmonary Function Test Date: 03/26/2010 Height (in.): 68 Gender: Female  Pre-Spirometry FVC    Value: 1.55 L/min   Pred: 2.90 L/min     % Pred: 53 % FEV1    Value: 1.14 L     Pred: 1.97 L     % Pred: 58 % FEV1/FVC  Value: 74 %     Pred: 68 %     % Pred: . % FEF 25-75  Value: 0.78 L/min   Pred: 2.05 L/min     % Pred: 38 %  Post-Spirometry FVC    Value: 1.79 L/min   Pred: 2.90 L/min     % Pred: 62 % FEV1    Value: 1.28 L     Pred: 1.97 L     % Pred: 65 % FEV1/FVC  Value: 71 %     Pred: 68 %     % Pred: . % FEF 25-75  Value: 0.55 L/min   Pred: 2.05 L/min     % Pred: 27 %  Lung Volumes TLC    Value: 3.79 L   % Pred: 69 % RV    Value: 2.21 L   % Pred: 92 % DLCO    Value: 12.0 %   % Pred: 63 % DLCO/VA  Value: 5.14 %   % Pred: 162 %  Comments: Mild obstruction.  Positive bronchodilator response.  Moderate restriction.  Moderate diffusion defect. Clinical Lists Changes  Observations: Added new observation of PFT COMMENTS: Mild obstruction.  Positive bronchodilator response.  Moderate restriction.  Moderate diffusion defect. (03/26/2010 9:07) Added new observation of DLCO/VA%EXP: 162 % (03/26/2010 9:07) Added new observation of DLCO/VA: 5.14 % (03/26/2010 9:07) Added new observation of DLCO % EXPEC: 63 % (03/26/2010 9:07) Added new observation of DLCO: 12.0 % (03/26/2010 9:07) Added new observation of RV % EXPECT: 92 % (03/26/2010 9:07) Added new observation of RV: 2.21 L (03/26/2010 9:07) Added new observation of TLC % EXPECT: 69 % (03/26/2010 9:07) Added new observation of TLC: 3.79 L (03/26/2010 9:07) Added new observation of FEF2575%EXPS: 27 % (03/26/2010 9:07) Added new observation of PSTFEF25/75P: 2.05  (03/26/2010 9:07) Added new observation of PSTFEF25/75%: 0.55 L/min (03/26/2010 9:07) Added new observation of PSTFEV1/FCV%: . % (03/26/2010 9:07) Added new observation of FEV1FVCPRDPS: 68 % (03/26/2010 9:07) Added new observation of  PSTFEV1/FVC: 71 % (03/26/2010 9:07) Added new observation of POSTFEV1%PRD: 65 % (03/26/2010 9:07) Added new observation of FEV1PRDPST: 1.97 L (03/26/2010 9:07) Added new observation of POST FEV1: 1.28 L/min (03/26/2010 9:07) Added new observation of POST FVC%EXP: 62 % (03/26/2010 9:07) Added new observation of FVCPRDPST: 2.90 L/min (03/26/2010 9:07) Added new observation of POST FVC: 1.79 L (03/26/2010 9:07) Added new observation of FEF % EXPEC: 38 % (03/26/2010 9:07) Added new observation of FEF25-75%PRE: 2.05 L/min (03/26/2010 9:07) Added new observation of FEF 25-75%: 0.78 L/min (03/26/2010 9:07) Added new observation of FEV1/FVC%EXP: . % (03/26/2010 9:07) Added new observation of FEV1/FVC PRE: 68 % (03/26/2010 9:07) Added new observation of FEV1/FVC: 74 % (03/26/2010 9:07) Added new observation of FEV1 % EXP: 58 % (03/26/2010 9:07) Added new observation of FEV1 PREDICT: 1.97 L (03/26/2010 9:07) Added new observation of FEV1: 1.14 L (03/26/2010 9:07) Added new observation of FVC % EXPECT: 53 % (03/26/2010 9:07) Added new observation of FVC PREDICT: 2.90 L (03/26/2010 9:07) Added new observation of FVC: 1.55 L (03/26/2010 9:07) Added new observation of PFT HEIGHT: 68  (03/26/2010 9:07) Added new observation of PFT DATE:  03/26/2010  (03/26/2010 9:07) 

## 2010-07-21 ENCOUNTER — Encounter: Payer: Self-pay | Admitting: Pulmonary Disease

## 2010-07-28 ENCOUNTER — Ambulatory Visit: Payer: Self-pay | Admitting: Internal Medicine

## 2010-07-30 ENCOUNTER — Other Ambulatory Visit: Payer: Medicare Other

## 2010-07-30 ENCOUNTER — Other Ambulatory Visit: Payer: Self-pay | Admitting: Pulmonary Disease

## 2010-07-30 ENCOUNTER — Encounter: Payer: Self-pay | Admitting: Pulmonary Disease

## 2010-07-30 ENCOUNTER — Ambulatory Visit (INDEPENDENT_AMBULATORY_CARE_PROVIDER_SITE_OTHER): Payer: Medicare Other | Admitting: Pulmonary Disease

## 2010-07-30 DIAGNOSIS — J310 Chronic rhinitis: Secondary | ICD-10-CM

## 2010-07-30 DIAGNOSIS — R05 Cough: Secondary | ICD-10-CM

## 2010-07-30 DIAGNOSIS — R059 Cough, unspecified: Secondary | ICD-10-CM

## 2010-07-30 MED ORDER — BENZONATATE 100 MG PO CAPS: ORAL_CAPSULE | ORAL | Status: DC

## 2010-07-30 MED ORDER — FLUTICASONE PROPIONATE 50 MCG/ACT NA SUSP: 2.0000 | Freq: Every day | NASAL | Status: DC

## 2010-07-30 NOTE — Progress Notes (Signed)
Subjective:    Patient ID: [name], female    DOB: 02/26/1922, 77 y.o.   MRN: [id number]  HPI 77 yo female with chronic cough.  She continues to have cough with clear sputum.  She has post-nasal drip, and thinks she has allergies also.  She denies fever, wheeze, chest tightness, or palpitations.    Past Medical History  Diagnosis Date  . HTN (hypertension)   . ESRD on hemodialysis   . Mild aortic stenosis   . Dyslipidemia   . TIA (transient ischemic attack)   . Gout   . Benign bladder mass   . DJD (degenerative joint disease)   . Anemia   . Obstructive sleep apnea     mild  . Chronic cough      Family History  Problem Relation Age of Onset  . Hypertension Mother   . Diabetes Mother      History   Social History  . Marital Status: Widowed    Spouse Name: N/A    Number of Children: N/A  . Years of Education: N/A   Occupational History  . Retired     Secretary/administrator   Social History Main Topics  . Smoking status: Never Smoker   . Smokeless tobacco: Not on file  . Alcohol Use: Not on file  . Drug Use: Not on file  . Sexually Active: Not on file   Other Topics Concern  . Not on file   Social History Narrative  . No narrative on file     Allergies  Allergen Reactions  . Sulfa Antibiotics   . Sulfonamide Derivatives      Outpatient Prescriptions Prior to Visit  Medication Sig Dispense Refill  . albuterol (PROAIR HFA) 108 (90 BASE) MCG/ACT inhaler 1-2 puffs every 4-6 hours as needed       . aspirin 81 MG tablet Take 81 mg by mouth daily.        Marland Kitchen atorvastatin (LIPITOR) 10 MG tablet Take 10 mg by mouth at bedtime.        . budesonide-formoterol (SYMBICORT) 160-4.5 MCG/ACT inhaler Inhale 2 puffs into the lungs 2 (two) times daily.        . calcium acetate (PHOSLO) 667 MG capsule Take 2 capsules by mouth two times a day       . Multiple Vitamin (MULTIVITAMIN) tablet Take 1 tablet by mouth daily.        . traMADol (ULTRAM) 50 MG tablet One by mouth two  times a day as needed             Review of Systems     Objective:   Physical Exam  Constitutional: She is oriented to person, place, and time. She appears well-developed and well-nourished.  HENT:  Mouth/Throat: Oropharynx is clear and moist. No oropharyngeal exudate.       Clear nasal discharge  Eyes: Left eye exhibits no discharge. No scleral icterus.  Neck: No JVD present.  Cardiovascular: Normal rate and regular rhythm.   No murmur heard. Pulmonary/Chest: Effort normal and breath sounds normal. She has no wheezes. She has no rales.  Musculoskeletal: She exhibits no edema.  Lymphadenopathy:    She has no cervical adenopathy.  Neurological: She is alert and oriented to person, place, and time. No cranial nerve deficit.  Skin: Skin is warm and dry.          Assessment & Plan:   Cough Likely secondary to asthma and rhinitis with post-nasal drip.  She is  to continue symbicort, and advised she can use proair more often.  Rhinitis Will have her use flonase two sprays daily and check RAST and IgE.    Updated Medication List Outpatient Encounter Prescriptions as of 07/30/2010  Medication Sig Dispense Refill  . albuterol (PROAIR HFA) 108 (90 BASE) MCG/ACT inhaler 1-2 puffs every 4-6 hours as needed       . aspirin 81 MG tablet Take 81 mg by mouth daily.        Marland Kitchen atorvastatin (LIPITOR) 10 MG tablet Take 10 mg by mouth at bedtime.        . budesonide-formoterol (SYMBICORT) 160-4.5 MCG/ACT inhaler Inhale 2 puffs into the lungs 2 (two) times daily.        . calcium acetate (PHOSLO) 667 MG capsule Take 2 capsules by mouth two times a day       . Multiple Vitamin (MULTIVITAMIN) tablet Take 1 tablet by mouth daily.        . benzonatate (TESSALON) 100 MG capsule One twice per day as needed for cough  30 capsule  1  . fluticasone (FLONASE) 50 MCG/ACT nasal spray 2 sprays by Nasal route daily.  16 g  12  . traMADol (ULTRAM) 50 MG tablet One by mouth two times a day as needed

## 2010-07-30 NOTE — Assessment & Plan Note (Signed)
Likely secondary to asthma and rhinitis with post-nasal drip.  She is to continue symbicort, and advised she can use proair more often.

## 2010-07-30 NOTE — Patient Instructions (Signed)
Flonase two sprays daily Symbicort two puffs twice per day Proair two puffs four times per day as needed for cough, wheeze, congestion, or shortness of breath Benzonatate 100 mg twice per day as needed for cough Lab test today

## 2010-07-30 NOTE — Assessment & Plan Note (Signed)
Will have her use flonase two sprays daily and check RAST and IgE.

## 2010-08-02 ENCOUNTER — Other Ambulatory Visit: Payer: Self-pay | Admitting: Internal Medicine

## 2010-08-04 ENCOUNTER — Other Ambulatory Visit: Payer: Self-pay | Admitting: Internal Medicine

## 2010-08-04 DIAGNOSIS — Z1231 Encounter for screening mammogram for malignant neoplasm of breast: Secondary | ICD-10-CM

## 2010-08-08 LAB — ALLERGY FULL PROFILE
Allergen, D pternoyssinus,d7: <0.35 kU/L (ref <0.35)
Allergen,Goose feathers, e70: <0.35 kU/L (ref <0.35)
Bahia Grass: <0.35 kU/L (ref <0.35)
Box Elder IgE: <0.35 kU/L (ref <0.35)
Common Ragweed: <0.35 kU/L (ref <0.35)
G005 Rye, Perennial: <0.35 kU/L (ref <0.35)
House Dust Hollister: <0.35 kU/L (ref <0.35)
IgE (Immunoglobulin E), Serum: 13.9 IU/mL (ref 0.0–180.0)
Oak: <0.35 kU/L (ref <0.35)
Stemphylium Botryosum: <0.35 kU/L (ref <0.35)
Timothy Grass: <0.35 kU/L (ref <0.35)

## 2010-08-12 LAB — POCT I-STAT 4, (NA,K, GLUC, HGB,HCT)
Glucose, Bld: 80 mg/dL (ref 70–99)
Glucose, Bld: 85 mg/dL (ref 70–99)
HCT: 43 % (ref 36.0–46.0)
Hemoglobin: 13.3 g/dL (ref 12.0–15.0)
Potassium: 6.1 mEq/L — ABNORMAL HIGH (ref 3.5–5.1)
Potassium: 6.1 mEq/L — ABNORMAL HIGH (ref 3.5–5.1)
Sodium: 135 mEq/L (ref 135–145)
Sodium: 135 mEq/L (ref 135–145)

## 2010-08-12 LAB — RENAL FUNCTION PANEL
Albumin: 3.8 g/dL (ref 3.5–5.2)
CO2: 24 mEq/L (ref 19–32)
Chloride: 101 mEq/L (ref 96–112)
GFR calc Af Amer: 4 mL/min — ABNORMAL LOW (ref >60)
GFR calc non Af Amer: 3 mL/min — ABNORMAL LOW (ref >60)
Sodium: 140 mEq/L (ref 135–145)

## 2010-08-14 LAB — POCT I-STAT 4, (NA,K, GLUC, HGB,HCT)
Glucose, Bld: 85 mg/dL (ref 70–99)
HCT: 42 % (ref 36.0–46.0)
Sodium: 135 mEq/L (ref 135–145)

## 2010-08-15 LAB — POCT I-STAT, CHEM 8
Calcium, Ion: 1.11 mmol/L — ABNORMAL LOW (ref 1.12–1.32)
Chloride: 102 mEq/L (ref 96–112)
Creatinine, Ser: 7 mg/dL — ABNORMAL HIGH (ref 0.4–1.2)
Glucose, Bld: 91 mg/dL (ref 70–99)
HCT: 42 % (ref 36.0–46.0)

## 2010-08-18 ENCOUNTER — Ambulatory Visit
Admission: RE | Admit: 2010-08-18 | Discharge: 2010-08-18 | Disposition: A | Discharge status: Home / Self Care | Payer: Medicare Other | Source: Ambulatory Visit | Attending: Internal Medicine | Admitting: Internal Medicine

## 2010-08-18 DIAGNOSIS — Z1231 Encounter for screening mammogram for malignant neoplasm of breast: Secondary | ICD-10-CM

## 2010-08-18 LAB — POCT I-STAT 4, (NA,K, GLUC, HGB,HCT): HCT: 41 % (ref 36.0–46.0)

## 2010-08-19 LAB — POCT I-STAT, CHEM 8
Chloride: 103 mEq/L (ref 96–112)
Glucose, Bld: 84 mg/dL (ref 70–99)
HCT: 38 % (ref 36.0–46.0)
Hemoglobin: 12.9 g/dL (ref 12.0–15.0)
Potassium: 4.5 mEq/L (ref 3.5–5.1)
Sodium: 140 mEq/L (ref 135–145)

## 2010-09-10 ENCOUNTER — Other Ambulatory Visit: Payer: Self-pay | Admitting: Internal Medicine

## 2010-09-10 DIAGNOSIS — Z1231 Encounter for screening mammogram for malignant neoplasm of breast: Secondary | ICD-10-CM

## 2010-09-21 ENCOUNTER — Encounter: Payer: Self-pay | Admitting: Pulmonary Disease

## 2010-09-21 NOTE — Op Note (Signed)
NAME:  [name], [name]             ACCOUNT NO.:  [id number]   MEDICAL RECORD NO.:  [id number]          PATIENT TYPE:  AMB   LOCATION:  SDS                          FACILITY:  [facility]   PHYSICIAN:  Rosetta Posner, M.D.    DATE OF BIRTH:  Oct 09, 1921   DATE OF PROCEDURE:  DATE OF DISCHARGE:                               OPERATIVE REPORT   PREOPERATIVE DIAGNOSIS:  End-stage renal disease with occluded left  upper arm arteriovenous fistula.   POSTOPERATIVE DIAGNOSIS:  End-stage renal disease with occluded left  upper arm arteriovenous fistula.   PROCEDURE:  Thrombectomy and revision with a small interposition graft  at the arteriovenous anastomosis of her left upper arm AV fistula.   SURGEON:  Rosetta Posner, M.D.   ASSISTANT:  Nurse.   ANESTHESIA:  MAC.   COMPLICATIONS:  None.   DISPOSITION:  To recovery room, stable.   PROCEDURE IN DETAIL:  The patient was taken to the operating room,  placed in supine position.  The area of the left arm was prepped and  draped in a sterile fashion.  Incision was made over the antecubital  space, carried down to isolate the arteriovenous anastomosis.  The  patient had had a recent shuntogram on Sep 15, 2007, and this revealed  widely patent fistula with a stenotic vein at the arteriovenous  anastomosis.  The vein was exposed further proximally and was of good  caliber.  The vein was opened through the sclerotic segment and was  thrombectomized.  The sclerotic area was excised.  The vein itself was  quite good above this with a good backbleeding.  This was flushed with  heparinized saline and re-occluded.  Brachial artery was occluded  proximally and distally with an 11-blade sewn with Potts scissors.  The  old arteriovenous anastomosis was excised.  A new interposition graft of  6-mm thin wall graft was brought onto the field, was spatulated and sewn  end-to-end to the vein with a running 6-0 Prolene suture and end-to-side  to the brachial artery  through running 6-0 Prolene suture.  Clamps  removed and an excellent thrill was noted.  The wounds were irrigated  with saline, hemostasis achieved, and electrocautery wounds were closed  with 3-0 Vicryl in the subcutaneous subcuticular tissue.  Benzoin and  Steri-Strips were applied.      Rosetta Posner, M.D.  Electronically Signed     TFE/MEDQ  D:  10/06/2007  T:  10/07/2007  Job:  [id number]

## 2010-09-21 NOTE — Procedures (Signed)
CEPHALIC VEIN MAPPING   INDICATION:  Preop exam for AV fistula placement.   HISTORY:  End-stage renal disease.   EXAM:  The right cephalic vein is compressible.   Diameter measurements range from 0.28 to 0.36 cm.   See attached worksheet for all measurements.   IMPRESSION:  Patent right cephalic vein with diameter measurements as  described above and on the attached worksheet.   ___________________________________________  V. Leia Alf, MD   CH/MEDQ  D:  04/21/2008  T:  04/21/2008  Job:  [id number]

## 2010-09-21 NOTE — Assessment & Plan Note (Signed)
[facility]                            CARDIOLOGY OFFICE NOTE   CHARNY, HASLIP                    MRN:          [id number]  DATE:10/13/2006                            DOB:          [birth year]    REFERRING PHYSICIAN:  Lance Muss, M.D.   REASON FOR PRESENTATION:  Evaluate patient with syncope.   HISTORY OF PRESENT ILLNESS:  Patient is now an 77 year old African  American female.  I saw her in November 2006 in the hospital with some  atypical chest discomfort.  She had negative stress perfusion study, and  was not scheduled for followup.  She had done relatively well.  She is  on dialysis.  She has had problems with hypotension.  She says that  particularly after dialysis, when she gets home, she has been  lightheaded.  She says she has had 3 syncopal episodes.  She has had  Norvasc discontinued, and is currently not on any antihypertensives.  Her most recent syncopal episode was this Tuesday.  She got home.  She  went to the bathroom.  She was walking back from the bathroom when she  felt lightheaded, and things going gray.  She then collapsed to the  floor.  She did have loss of bowel.  She broke her arm, and injured her  eye.  When she woke up, she knew exactly where she was.  She did go to  the emergency room.  EKG and cardiac enzymes were negative.  Patient is  now referred for further evaluation.  She is not describing orthostatic  symptoms.  She says her blood pressure is particularly low the days  after dialysis.  She sometimes has them stop dialysis when the systolic  goes below [id number], because she knows she will get lightheaded.  She is not  feeling any palpitations.  She is not having any chest discomfort, neck  discomfort, arm discomfort, activity-induced nausea, vomiting, or  excessive diaphoresis.  She could not wear compression stockings  recently when these were prescribed.   PAST MEDICAL HISTORY:  1. Mild aortic sclerosis  on echocardiogram 2006.  2. Chronic renal insufficiency, the patient believes secondary to      hypertension, dialysis dependent.  3. Hyperlipidemia.  4. History of TIA.  5. History of gout.  6. Benign bladder mass.  7. 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  8. Anemia of chronic disease.   PAST SURGICAL HISTORY:  1. Left nephrectomy in the 1960s.  2. Partial hysterectomy.   ALLERGIES:  SULFA.   MEDICATIONS:  1. Lipitor 10 mg daily.  2. PhosLo.  3. Aspirin 81 mg daily.  4. Stool softener.  5. Dialysis.  6. Vitamin.  7. Hectorol.   SOCIAL HISTORY:  The patient lives alone.  She has 3 children.  She is a  retired Secretary/administrator.  She has several grandchildren and great  grandchildren.   FAMILY HISTORY:  Contributory for her mother dying from complications of  hypertension and diabetes at age 72.   REVIEW OF SYSTEMS:  As stated in the HPI, and otherwise negative for all  other systems.  PHYSICAL EXAMINATION:  The patient is in no acute distress.  Blood pressure 110/64.  Heart rate 95 and regular.  No orthostatic blood  pressure drop.  HEENT:  Eyes unremarkable.  Pupils equal, round, and reactive to light.  Fundi not visualized secondary to small pupils on lens opacification.  Oral mucosa unremarkable.  NECK:  No jugular venous distention at 45 degrees.  Carotid upstroke  brisk and symmetric.  No bruits.  No thyromegaly.  LYMPHATICS:  No cervical, axillary, or inguinal adenopathy.  LUNGS:  Clear to auscultation bilaterally.  BACK:  No costovertebral angle tenderness.  CHEST:  Unremarkable.  HEART:  PMI not displaced or sustained.  S1 and S2 within normal limits.  No S3.  No S4.  No clicks.  No rubs.  No murmurs.  ABDOMEN:  Flat.  Positive bowel sounds.  Normal in frequency and pitch.  No bruits.  No guarding.  No midline pulsatile mass.  No hepatomegaly.  No splenomegaly.  SKIN:  No rashes.  No nodules.  EXTREMITIES:  Two plus pulses.  No cyanosis or clubbing.   No edema.  NEUROLOGIC:  Oriented to person, place, and time.  Cranial nerves 2  through 12 grossly intact.  Motor grossly intact throughout.   EKG:  Sinus rhythm, rate 92, axis within normal limits, poor anterior R  wave progression suggestive of an old anteroseptal myocardial  infarction, no acute ST-T wave changes.   ASSESSMENT AND PLAN:  1. Syncope.  The patient's syncope is almost definitely related to      drop in blood pressure.  I think there is a small possibility of an      arrhythmia.  She had some mild septal hypertrophy and aortic      sclerosis in the past.  I do not hear overt murmurs consistent with      worsening of this.  However, the next step in cardiac evaluation      will be a 48-hour Holter monitor that she will need to wear on a      dialysis day.  In addition, I will get an echocardiogram.      Unfortunately, she cannot wear compression stockings.  She is      already off all of her blood pressure medicines.  I can discuss      with Dr. Marval Regal any change to her dialysis to try to avoid      this.  Finally, we could consider adding fludrocortisone going      forward.  2. Followup.  I would like to see her back in about a month, or      sooner, based on the results of the above.     Minus Breeding, MD, [facility]  Electronically Signed    JH/MedQ  DD: 10/13/2006  DT: 10/13/2006  Job #: [id number]   cc:   Donato Heinz, M.D.

## 2010-09-21 NOTE — Assessment & Plan Note (Signed)
OFFICE VISIT   [name], [name]  DOB:  [birth year]                                       06/09/2008  [id number]   REASON FOR VISIT:  Followup.   HISTORY:  This is an 77 year old female with history of left upper arm  AV fistula which recently thrombosed requiring placement of Diatek  catheter.  She underwent right wrist Cimino fistula placement on 12/18.  She comes in today for evaluation.   On examination there is a good thrill within her fistula up to the  antecubital crease.  Her vein appears to be of adequate size.  I think  this will likely be able to serve as a good conduit for her to receive  dialysis.  I would probably delay accessing it for other month.   Eldridge Abrahams, MD  Electronically Signed   VWB/MEDQ  D:  06/09/2008  T:  06/11/2008  Job:  [id number]   cc:   Donato Heinz, M.D.

## 2010-09-21 NOTE — Op Note (Signed)
[name], [name]             ACCOUNT NO.:  [id number]   MEDICAL RECORD NO.:  [id number]          PATIENT TYPE:  AMB   LOCATION:  SDS                          FACILITY:  [facility]   PHYSICIAN:  Jessy Oto. Fields, MD  DATE OF BIRTH:  August 02, 1921   DATE OF PROCEDURE:  12/05/2008  DATE OF DISCHARGE:  12/05/2008                               OPERATIVE REPORT   PROCEDURE:  Right upper extremity fistulogram.   PREOPERATIVE DIAGNOSIS:  Non-maturing right upper arm arteriovenous  fistula.   POSTOPERATIVE DIAGNOSIS:  Non-maturing right upper arm arteriovenous  fistula.   ANESTHESIA:  Local.   OPERATIVE DETAIL:  After obtaining informed consent, the patient was  taken to the [facility] lab.  The patient was placed in supine position on the  angio table.  The patient's entire right upper extremity was prepped and  draped in usual sterile fashion.  Local anesthesia was infiltrated over  a preexisting right brachiocephalic AV fistula.  A micropuncture set was  used to cannulate the right brachiocephalic fistula approximately 5 cm  from the anastomosis.  The micropuncture wire was placed within the  fistula without difficulty.  The sheath and dilator were then placed  over the guidewire and the dilator and guidewire removed.  A fistulogram  was then obtained.  This showed nearly occlusive inflow from the sheath  itself.  The outflow of the fistula showed a tandem lesion in the  midupper arm of approximately 50% stenosis, most likely consistent with  a hypertrophied valve.  The remainder of the cephalic vein was normal in  appearance as well as the insertion of the cephalic vein into the  subclavian vein.  The right subclavian vein and innominate vein are  patent.  There is some collateralization of the cephalic vein with  branches in the midarm, eventually communicating to the right subclavian  vein.   It was decided to intervene on the tandem stenosis in the upper arm.  At  this point, a 0.035  Versacore wire was placed through the micropuncture  sheath.  This was exchanged for a 5-French short sheath.  However, at  this point, it was noticed that the 5-French short sheath was totally  occlusive of the fistula and I was unable to aspirate blood back through  this.  The patient was given 2000 units of heparin prior to the sheath  exchange.  At this point, I decided that the best option would be to  remove the sheath completely and obtain hemostasis since this was  totally occlusive.  After hemostasis had been obtained with direct  pressure for approximately 20 minutes, the fistula was again cannulated  closer to the anastomosis approximately 3 cm below the original stick  site.  At this point, the fistula was found to be diffusely small, less  than 1 mm in diameter.  She was decided at this point that the fistula  was not going to mature due to the small proximal portion of the vein  and attempts to angioplasty of the fistula were abandoned.  The  micropuncture sheath was removed and hemostasis again obtained with  direct  pressure.  The patient tolerated the procedure well and there  were no complications.  There was still flow in AV fistula at the  conclusion of the procedure.  Findings were discussed with the patient  and she will be scheduled on December 22, 2008, for placement of a right  arm AV graft.   OPERATIVE FINDINGS:  Diffuse narrowing of proximal aspect of right  brachiocephalic AV fistula with tandem stenosis of cephalic vein and  upper third of right arm and non-salvageable AV fistula which will need  revision to an AV graft.      Jessy Oto. Fields, MD  Electronically Signed     CEF/MEDQ  D:  12/08/2008  T:  12/09/2008  Job:  [id number]

## 2010-09-21 NOTE — Op Note (Signed)
[name], [name]             ACCOUNT NO.:  [id number]   MEDICAL RECORD NO.:  [id number]          PATIENT TYPE:  AMB   LOCATION:  SDS                          FACILITY:  [facility]   PHYSICIAN:  Theotis Burrow IV, MDDATE OF BIRTH:  1921/10/24   DATE OF PROCEDURE:  07/16/2008  DATE OF DISCHARGE:  07/16/2008                               OPERATIVE REPORT   PREOPERATIVE DIAGNOSIS:  Non-maturing right arm fistula.   POSTOPERATIVE DIAGNOSIS:  Non-maturing right arm fistula.   PROCEDURE PERFORMED:  1. Ultrasound access of right arm fistula.  2. Right arm fistulogram.   INDICATIONS:  This is an 77 year old gentleman status post right  radiocephalic fistula that is non-maturing.  Ultrasound found a stenosis  proximally.  She comes in for fistulogram with a possible intervention.   PROCEDURE:  The patient was identified in the holding and taken to room  8 and placed supine on the table.  The right arm was prepped and draped  in standard sterile fashion.  Time-out was called.  Ultrasound was used  to evaluate the cephalic vein which was patent and compressible.  A  micropuncture needle was used to access the cephalic vein under  ultrasound guidance.  Over an 0.018 mandrill wire, a micropuncture  sheath was placed.  The sheath contrast injections were performed.   FINDINGS:  The arteriovenous anastomosis is widely patent.  The vein is  sclerotic and stenotic just proximal to the anastomosis.  The vein then  dilates for approximately 3-4 cm and then becomes stenotic and sclerotic  again.  The cephalic vein is patent throughout the upper arm; however,  it is small in caliber.  There is no evidence of central stenosis.   After the above images were obtained, decision was made not to intervene  as this did not appear to be a salvageable fistula.  The patient will be  scheduled for a left-sided graft as she is right handed.   IMPRESSION:  Sclerotic and stenotic cephalic vein, not amendable  to  fistula maturation.           ______________________________  V. Leia Alf, MD  Electronically Signed     VWB/MEDQ  D:  07/16/2008  T:  07/16/2008  Job:  [id number]

## 2010-09-21 NOTE — Assessment & Plan Note (Signed)
OFFICE VISIT   Barbara, Porter  DOB:  1921/08/01                                       04/21/2008  [id number]   REASON FOR VISIT:  Evaluate for dialysis access.   HISTORY:  This is an 77 year old female who had been dialyzing through a  left upper arm AV fistula that has now thrombosed.  She recently had a  Diatek catheter placed.  She comes in today for evaluation of additional  access.  She is right-handed.   PHYSICAL EXAMINATION:  She is afebrile.  Blood pressure is 168/76 with  heart rate 77.   She has a palpable right radial pulse.  She is in no distress.  The left  upper arm graft is thrombosed.   DIAGNOSTIC STUDIES:  Vein mapping was performed today which shows her to  have adequate cephalic vein at the wrist.   ASSESSMENT/PLAN:  End-stage renal disease needing permanent access.   PLAN:  The patient will be scheduled for a right wrist Cimino fistula  this Friday, December 18th.  The risks and benefits were discussed with  the patient, including risk of non-maturity and risk of steal syndrome.  Again, she will be scheduled for a right wrist fistula this Friday.   Eldridge Abrahams, MD  Electronically Signed   VWB/MEDQ  D:  04/21/2008  T:  04/22/2008  Job:  [id number]   cc:   Donato Heinz, M.D.

## 2010-09-21 NOTE — Assessment & Plan Note (Signed)
OFFICE VISIT   Barbara, Porter  DOB:  [birth year]                                       08/18/2008  [id number]   REASON FOR VISIT:  Non-maturing fistula.   HISTORY:  This is an 77 year old female who underwent right Cimino  fistula that did not mature.  Fistulogram was performed which showed  sclerotic diminutive vein.  She was not a candidate to have this  revised.  She comes back in for further discussions on dialysis access.   After reviewing the patient's fistulogram, I feel it is reasonable to  try to proceed with a right upper arm AV fistula.  In the event that I  feel her vein is not adequate, I would proceed with a left forearm AV  Gore-Tex graft in the same setting.  The patient understands this.  Her  operation has been scheduled for August 22, 2008.   Eldridge Abrahams, MD  Electronically Signed   VWB/MEDQ  D:  08/18/2008  T:  08/19/2008  Job:  [id number]

## 2010-09-21 NOTE — Procedures (Signed)
VASCULAR LAB EXAM   INDICATION:  Evaluate poorly functioning right wrist AV fistula  originally placed on April 25, 2008, by Dr. Trula Slade.   HISTORY:  Diabetes:  No.  Cardiac:  No.  Hypertension:  Yes, smoking.   EXAM:  Duplex of right cephalic vein fistula.   IMPRESSION:  1. The inflow artery, the right radial artery is patent.  2. The cephalic vein fistula is patent.  3. Proximal to the fistula 2.25 cm, in the cephalic vein, the peak      systolic velocity increases from 150 cm/s to greater than 500 cm/s.      The cephalic vein is 4.1 cm in diameter proximal and distal to the      stenosis.  The stenosis is approximately 2.8 cm long.   ___________________________________________  V. Leia Alf, MD   MC/MEDQ  D:  07/14/2008  T:  07/15/2008  Job:  [id number]

## 2010-09-21 NOTE — Procedures (Signed)
NAME:  [name], [name] NO.:  [id number]   MEDICAL RECORD NO.:  [id number]          PATIENT TYPE:  OUT   LOCATION:  SLEEP CENTER                 FACILITY:  [facility]   PHYSICIAN:  Chesley Mires, MD        DATE OF BIRTH:  09/22/1921   DATE OF STUDY:  03/02/2008                            NOCTURNAL POLYSOMNOGRAM   REFERRING PHYSICIAN:   INDICATION FOR STUDY:  [name] is an 77 year old female who has a  history of cardiovascular disease and end-stage renal disease as well as  chronic cough.  She also complains of sleep disruption and excessive  daytime sleepiness.  She is referred to the sleep lab for evaluation of  sleep disordered breathing.   Height is 5 feet 9 inches.  Weight is 163 pounds.  BMI is 24.  Neck size  is 14 inches.  Blood pressure is 142/72.   EPWORTH SLEEPINESS SCORE:  Epworth score is 3.   MEDICATIONS:  Aspirin, lisinopril, and Mucinex.   SLEEP ARCHITECTURE:  Total recording time was 3:90 minutes.  Total sleep  time was 188 minutes.  Sleep efficiency was 48%.  Sleep latency is 130  minutes.  REM latency is 64 minutes.  The study was not looked for lack  of slow-wave sleep, and the patient slept exclusively in the nonsupine  position.   RESPIRATORY DATA:  The average respiratory rate was 12.  Moderate  snoring was noted by the technician.  The overall apnea-hypopnea index  was 6.4.  The events were exclusively obstructive in nature.  The REM  apnea-hypopnea index was 28.  The non REM apnea-hypopnea index was 0.4.   OXYGEN DATA:  The baseline oxygenation was 92%.  The oxygen saturation  nadir was 85%.  The patient spent a total of 1 minute with an oxygen  saturation less than 90% and 0.1 minute with an oxygen saturation less  than 88%.   CARDIAC DATA:  The average heart rate was 70 and the rhythm strip showed  no sinus rhythm with occasional PACs and PVCs.   MOVEMENT-PARASOMNIA:  The periodic limb movement index was 0.  The  patient had 0  rest room trip.   IMPRESSIONS-RECOMMENDATIONS:  This study shows evidence for mild  obstructive sleep apnea as her apnea-hypopnea index was 6.4 with an  oxygen saturation of 85%.  She did have a significant REM predominant  sleep disordered breathing.  Of note, is that she did not sleep in the  supine position and therefore, the severity of the sleep apnea may  actually be underestimated.  In addition to diet, exercise, and weight  reduction, further therapeutic  options could include CPAP therapy, oral appliance, or surgical  intervention.  Clinical correlation would be necessary to determine what  are the additional therapies may be needed.      Chesley Mires, MD  Diplomat, American Board of Sleep Medicine  Electronically Signed     VS/MEDQ  D:  03/13/2008 07:44:36  T:  03/13/2008 21:31:49  Job:  [id number]

## 2010-09-21 NOTE — Op Note (Signed)
[name], [name]             ACCOUNT NO.:  [id number]   MEDICAL RECORD NO.:  [id number]          PATIENT TYPE:  AMB   LOCATION:  SDS                          FACILITY:  [facility]   PHYSICIAN:  Jessy Oto. Fields, MD  DATE OF BIRTH:  09-14-1921   DATE OF PROCEDURE:  12/22/2008  DATE OF DISCHARGE:  12/22/2008                               OPERATIVE REPORT   PROCEDURE:  Right forearm arteriovenous graft.   PREOPERATIVE DIAGNOSES:  1. Nonmaturing arteriovenous fistula.  2. End-stage renal disease.   POSTOPERATIVE DIAGNOSES:  1. Nonmaturing arteriovenous fistula.  2. End-stage renal disease.   ANESTHESIA:  Local with IV sedation.   SURGEON:  Jessy Oto. Fields, MD   ASSISTANT:  Jacinta Shoe, PA-C   OPERATIVE FINDINGS:  A 4-7-mm tapered PTFE graft to cephalic vein.   OPERATIVE DETAILS:  After obtaining informed consent, the patient was  taken to the operating room.  The patient was placed in supine position  on the operating table.  After adequate sedation, the patient's entire  right upper extremity was prepped and draped in the usual sterile  fashion.  Local anesthesia was infiltrated at a preexisting scar near  the antecubital crease.  Incision was carried down through the  subcutaneous tissues down the level of preexisting brachiocephalic AV  fistula.  The vein was dissected free circumferentially, it was  approximately 5 mm in diameter.  The brachial artery was then dissected  free proximal and distal to the level of the anastomosis.  A  subcutaneous tunnel was then created in a loop configuration down to the  forearm and a 4-7-mm PTFE graft was brought through the subcutaneous  tunnel with 4-mm end of the ulnar aspect of the arm.  A transverse  incision was made in the distal forearm for assistance in tunneling.  The patient was then given 5000 units of intravenous heparin.  Vessel  loops were used to control the artery proximally and distally.  A fine  bulldog clamp  was used to control the vein.  The previous brachial to  cephalic vein anastomosis was taken down.  The cephalic vein was probed  and found to be easily except three coronary dilators for its full  length.  A #4 Fogarty catheter was passed up the vein and also I was  able to advance this all the way into the central veins without  difficulty.  There was good backbleeding from the vein.   At this point, the old anastomosis to the artery was debrided away.  A 4-  mm end of the graft was slightly beveled.  This was then sewn end of  graft to the side of artery using a running 6-0 Prolene suture.  Just  prior to completion of the anastomosis, it was forebled, backbled, and  thoroughly flushed.  Anastomosis was secured.  Clamps were released.  There was pulsatile flow in the graft immediately.  The graft was then  clamped on its venous end, trimmed to length and then beveled.  The  cephalic vein was slightly spatulated.  Vein was then sewn end-to-end to  the vein using  a running 6-0 Prolene suture.  Just prior to completion  of the anastomosis, it was forebled, backbled, and thoroughly flushed.  Anastomosis was secured.  Clamps were released.  There was palpable  thrill in the graft immediately.  Next, hemostasis was obtained by  administering 50 mg of protamine as well as thrombin Gelfoam.  After  hemostasis had been obtained, both incisions were closed with a running  3-0 Vicryl subcutaneous stitch.  Skin was closed with a 4-0 Vicryl  subcuticular stitch.  Dermabond was applied to both skin wounds.  The  patient tolerated the procedure well and there were no complications.  Instrument, sponge, and needle counts were correct at the end of the  case.  The patient was taken to the recovery room in stable condition.  The patient had an audible radial Doppler signal with minimal change in  augmentation with compression of the graft.      Jessy Oto. Fields, MD  Electronically Signed      CEF/MEDQ  D:  12/22/2008  T:  12/23/2008  Job:  [id number]

## 2010-09-21 NOTE — Assessment & Plan Note (Signed)
OFFICE VISIT   [name], [name]  DOB:  02-20-1922                                       10/13/2008  [id number]   REASON FOR VISIT:  Followup.   HISTORY:  This is an 77 year old female who underwent a right upper arm  AV fistula done on 08/22/2008.  She comes back for followup.  There is  excellent thrill within her fistula, throughout her upper arm.  I would  recommend waiting another month before accessing this fistula but at  that point I think it will provide good access for her.  I am not going  to schedule her to come back to see me.  Please contact me if there are  any issues with access.   Eldridge Abrahams, MD  Electronically Signed   VWB/MEDQ  D:  10/13/2008  T:  10/15/2008  Job:  [id number]   cc:   Donato Heinz, M.D.

## 2010-09-21 NOTE — Op Note (Signed)
[name], [name]             ACCOUNT NO.:  [id number]   MEDICAL RECORD NO.:  [id number]          PATIENT TYPE:  AMB   LOCATION:  SDS                          FACILITY:  [facility]   PHYSICIAN:  Dorothea Glassman, M.D.    DATE OF BIRTH:  1922/04/10   DATE OF PROCEDURE:  11/03/2006  DATE OF DISCHARGE:                               OPERATIVE REPORT   SURGEON:  Dorothea Glassman, M.D.   ANESTHESIA:  Local with MAC.   ANESTHESIOLOGIST:  Ala Dach, M.D.   PREOPERATIVE DIAGNOSIS:  1. End stage renal failure.  2. Infiltration left upper arm arteriovenous fistula.   POSTOPERATIVE DIAGNOSIS:  1. End stage renal failure.  2. Infiltration left upper arm arteriovenous fistula.   PROCEDURE:  Ultrasound guided right internal jugular Diatek catheter.   OPERATIVE PROCEDURE:  The patient was brought to the operating room in  stable condition.  She was placed in a supine position.  Ultrasound of  the right neck was carried out.  A large right internal jugular vein  identified.  This appeared widely patent with normal compressibility and  respiratory variation.  The right neck and chest were prepped and draped  in a sterile fashion.   The skin and subcutaneous tissues were instilled with 1% Xylocaine.  A  needle was introduced into the right internal jugular vein.  A 0.035 J-  wire was passed through the needle into the superior vena cava.  The  site opened with an 11 blade.  12, 14, and 16 dilators advanced over the  guidewire.  A 16 dilator and tearaway sheath were advanced over the  guidewire.  Te dilator and guidewire were removed.  The catheter was  placed through the sheath to the superior vena cava right atrial  junction.  The tearaway sheath was removed.  A subcutaneous tunnel was  created, the catheter brought through the tunnel.  The divided hub  mechanism assembled.  Flushed with heparin saline solution and capped  with heparin.  Insertion site closed with interrupted 3-0 nylon  suture.  The catheter affixed to the skin with interrupted 2-0 silk suture.  A  sterile dressing was applied.   The patient tolerated the procedure well.  There were no apparent  complications.  She was transferred to the recovery room in stable  condition.     Dorothea Glassman, M.D.  Electronically Signed    PGH/MEDQ  D:  11/03/2006  T:  11/04/2006  Job:  [id number]

## 2010-09-21 NOTE — Op Note (Signed)
[name], [name]             ACCOUNT NO.:  [id number]   MEDICAL RECORD NO.:  [id number]          PATIENT TYPE:  AMB   LOCATION:  SDS                          FACILITY:  [facility]   PHYSICIAN:  Theotis Burrow IV, MDDATE OF BIRTH:  06/06/1921   DATE OF PROCEDURE:  08/22/2008  DATE OF DISCHARGE:  08/22/2008                               OPERATIVE REPORT   PREOPERATIVE DIAGNOSIS:  End-stage renal disease.   POSTOPERATIVE DIAGNOSIS:  End-stage renal disease.   PROCEDURE PERFORMED:  Right upper arm AV fistula.   SURGEON:  1. Annamarie Major IV, MD.   ANESTHESIA:  General.   COMPLICATIONS:  None.   BLOOD LOSS:  Minimal.   FINDINGS:  Vein accepted a 4.5 dilator.   INDICATIONS:  This is an 77 year old female with an end-stage renal  disease.  She has had multiple access procedures on the left arm.  She  recently had a right radiocephalic fistula placed, which did not mature.  Fistulogram was performed, which showed occlusion of the cephalic vein  on the lower arm.  There was a marginal cephalic vein of the upper arm.  She presents today for exploration of possible fistula and possible left  arm graft.   PROCEDURE:  The patient was identified in the holding area and taken to  room 8.  She was placed supine on the table.  General endotracheal  anesthesia was administered.  The patient was prepped and draped in  standard sterile fashion.  Time-out was called.  Antibiotics were given.  Ultrasound was used to identify the course of the cephalic vein of the  upper arm.  Transverse incision was made at the level of antecubital  crease.  The cephalic vein was identified within the incision and  circled at the vessel loop.  The vein appeared to be of adequate  diameter.  It was mobilized proximally and distally.  The incision was  then extended and the brachial artery was identified.  It was mobilized  proximally and distally.  At this point in time, the patient was given  systemic  heparinization.  The vein was marked for orientation and then  ligated at the distal end.  The vein was then flushed with heparinized  saline.  It easily accepted a 4.5-mm coronary dilator.  Next, the  brachial artery was occluded with vascular clamps.  A #11 blade was used  to make an arteriotomy.  This was extended with Potts scissors.  The  vein was then spatulated.  An end-to-side anastomosis was created with a  running 6-0 Prolene.  Prior to completion anastomosis, a graft of the  artery was flushed in an antegrade and retrograde fashion and  anastomosis was then secured.  I then confirmed that there were no kinks  within the course of the cephalic vein.  There was good flow within the  fistula.  The patient's  heparin was reversed with 25 mg of protamine.  The deep tissue was  reapproximated with 3-0 Vicryl and subcutaneous tissue was closed with 4-  0 Vicryl.  The patient tolerated the procedure well.  She had a palpable  radial pulse.      Eldridge Abrahams, MD  Electronically Signed     VWB/MEDQ  D:  08/22/2008  T:  08/23/2008  Job:  [id number]

## 2010-09-21 NOTE — Assessment & Plan Note (Signed)
OFFICE VISIT   PIA, NANEZ  DOB:  Nov 27, 1921                                       07/14/2008  [id number]   REASON FOR VISIT:  Nonmaturing fistula.   HISTORY:  This is an 77 year old female who underwent right wrist Cimino  fistula on 04/25/2008.  This has not matured as we would like.  She  comes in today for further evaluation.  She is currently dialyzing  through a catheter.   Blood pressure 161/74, pulse 69.  There is a thrill proximally however  this dissipates as you go up the forearm.   DIAGNOSTIC STUDIES:  Ultrasound reveals patent inflow.  However, there  is a stenosis approximately 2.5 cm proximal to the fistula.  Velocities  increase from 150 to greater than 500 in this region which is about 2 cm  long.  Cephalic vein measures approximately 4.1 cm in diameter proximal  and distal to the stenosis.   ASSESSMENT:  Nonmaturing fistula.   PLAN:  The patient will be scheduled for a right arm fistulogram.  I  will plan on accessing from the antecubital crease down towards the  arterial anastomosis and potentially intervening on the stenosis.  Again, this is scheduled for Wednesday, March 10.   Eldridge Abrahams, MD  Electronically Signed   VWB/MEDQ  D:  07/14/2008  T:  07/16/2008  Job:  [id number]   cc:   Donato Heinz, M.D.

## 2010-09-21 NOTE — Op Note (Signed)
[name], STRAKA             ACCOUNT NO.:  [id number]   MEDICAL RECORD NO.:  [id number]          PATIENT TYPE:  AMB   LOCATION:  SDS                          FACILITY:  [facility]   PHYSICIAN:  Theotis Burrow IV, MDDATE OF BIRTH:  04-Sep-1921   DATE OF PROCEDURE:  04/25/2008  DATE OF DISCHARGE:  04/25/2008                               OPERATIVE REPORT   PREOPERATIVE DIAGNOSES:  End-stage renal disease.   POSTOPERATIVE DIAGNOSIS:  End-stage renal disease.   PROCEDURE PERFORMED:  Right wrist (Cimino) fistula.   TYPE OF ANESTHESIA:  MAC.   COMPLICATIONS:  None.   FINDINGS:  Small artery - 2-mm vein dilated up to a 4.5 dilator.   PROCEDURE IN DETAIL:  The patient was identified in the holding area and  taken to room 6.  She was placed supine on the table.  The right arm was  prepped and draped in standard sterile fashion.  Time-out was called.  Antibiotics were given.  Lidocaine 1% was used for local anesthesia.  The course of cephalic vein was mapped with ultrasound.  The vein  appeared to be of adequate diameter throughout its course.  A transverse  incision was made over the anticipated course of the cephalic vein and  the radial artery.  Cephalic vein was visualized first.  It was  circumferentially dissected free and encircled with a vessel loop.  It  was then mobilized proximally and distally and side branches were  ligated with 3-0 ties.  The vein was marked with an ink pen to ensure  proper orientation.  Next, the radial artery was identified within the  wound.  It was mobilized for approximately 2 cm.  The radial artery was  very small, approximately 1.5 mm.  At this point, I elected to proceed  with a wrist fistula.  The patient was given systemic heparinization.  The distal end of the vein was ligated.  The cut end was tied off with a  2-0 silk tie.  Next, the vein was dilated with coronary dilators  beginning with a 1.5 up to a 4.5 dilator.  Vein accommodated the  dilators without significant resistance.  Next, the radial artery was  occluded with bulldog clamps.  A longitudinal arteriotomy was made with  #11 blade and Potts scissors were used to open the artery.  Next, the  vein was cut to the appropriate length and then spatulated to fit the  size of the arteriotomy.  An end-to-side anastomosis was created using a  running 7-0 Prolene.  Prior to completion of anastomosis, the artery was  flushed in antegrade and retrograde fashion.  Next, the anastomosis was  secured.  Clamps were released.  There was a good flow by Doppler up to  the elbow.  There was a signal in the radial artery distal.  I inspected  to make sure that there were no kinks within the cephalic vein.  At this  point, I elected to close.  The patient was given 25 mg of protamine.  The deep tissue was closed with 3-0 Vicryl and skin was closed with 4-0  Vicryl.  Dermabond was placed.  The patient tolerated the procedure well  and was taken to the recovery room in stable condition.          ______________________________  V. Leia Alf, MD  Electronically Signed    VWB/MEDQ  D:  04/25/2008  T:  04/26/2008  Job:  [id number]

## 2010-09-21 NOTE — Procedures (Signed)
VASCULAR LAB EXAM   INDICATION:  Right upper arm AV fistula created on 08/22/2008 has failed  to mature.  The patient currently dialyzes via right subclavian  catheter.   HISTORY:  Diabetes:  No.  Cardiac:  No.  Hypertension:  Yes.   EXAM:  Duplex of nonmaturing right upper arm AV fistula.   IMPRESSION:  1. Velocities suggest greater than 75% stenosis of the right cephalic      vein at the axilla level.  Peak systolic velocities increase from      153 cm/s at the proximal upper arm to 567 cm/s at the area of      questionable stenosis.  2. The diameter of the right cephalic vein at the level of      questionable stenosis is 0.56 cm.  3. Patent right arm AV fistula.   ___________________________________________  Jessy Oto. Fields, MD   MC/MEDQ  D:  11/26/2008  T:  11/26/2008  Job:  [id number]

## 2010-09-21 NOTE — Assessment & Plan Note (Signed)
OFFICE VISIT   [name], [name]  DOB:  Apr 03, 1922                                       11/26/2008  [id number]   The patient is an 77 year old female referred for a non-maturing AV  fistula.  Right brachiocephalic AV fistula was placed by Dr. Trula Slade in  April 2010.  They have been unable to cannulate the fistula secondary to  non-maturation.  She currently dialyzes on Tuesday, Thursday and  Saturday.   On physical exam today, blood pressure is 148/67 in the left arm, pulse  is 74 and regular.  On exam, she has a palpable thrill in the fistula in  her right arm.  However, this is fairly small in the mid upper arm.  She  had a duplex ultrasound today which suggested a high-grade stenosis of  the distal right cephalic vein.  Of note, the diameter of the vein at  this level was approximately 5-mm.  Fistula was patent.   I believe the best option for the patient at this point would be a  fistulogram, possible angioplasty of this area if it is, indeed,  narrowed.  Hopefully, this will assist with fistula maturation.  Risks,  benefits, possible complications and procedure details were explained to  the patient today including, but not limited to, bleeding, infection,  vessel injury.  She understands and agrees to proceed.  Her fistulogram  is scheduled for December 05, 2008.   Jessy Oto. Fields, MD  Electronically Signed   CEF/MEDQ  D:  11/27/2008  T:  11/27/2008  Job:  [id number]   cc:   Donato Heinz, M.D.  [place]

## 2010-09-21 NOTE — Op Note (Signed)
[name], [name]             ACCOUNT NO.:  [id number]   MEDICAL RECORD NO.:  [id number]          PATIENT TYPE:  OBV   LOCATION:  2550                         FACILITY:  [facility]   PHYSICIAN:  Theotis Burrow IV, MDDATE OF BIRTH:  December 01, 1921   DATE OF PROCEDURE:  03/30/2008  DATE OF DISCHARGE:                               OPERATIVE REPORT   PREOPERATIVE DIAGNOSIS:  End-stage renal disease.   POSTOPERATIVE DIAGNOSIS:  End-stage renal disease.   PROCEDURE PERFORMED:  Diatek catheter placement with ultrasound  guidance.   ANESTHESIA:  MAC.   BLOOD LOSS:  Minimal.   COMPLICATIONS:  None.   PROCEDURE:  The patient was identified in the holding area and taken to  the room 6.  She was placed supine on table.  She was prepped and draped  in a standard sterile fashion.  A time-out was called.  Antibiotics were  given.  The right internal jugular vein was evaluated, found to be  easily compressible and widely patent.  After 1% lidocaine was used for  local anesthesia, the vein was accessed under ultrasound guidance with  an 18-gauge needle.  A 3.5 wire was advanced in the inferior vena cava  under fluoroscopic visualization.  The track was sequentially dilated  and peel-away sheath was placed.  A 28-cm Diatek catheter was advanced  through the peel-away sheath, which was then removed.  The site was  selected for the skin exit site.  A #11 blade was used to make a skin  incision.  The subcutaneous tunnel was created and then dilated.  The  catheters pulled through the tunnel.  Cuff was placed through the skin  exit site.  Fluoroscopy was used to confirm the catheter to the  cavoatrial junction.  Both ports flushed and aspirated without  difficulty.  The catheter was sewn in place with 3-0 nylon.  Skin  incision and the neck was closed with a 4-0 Vicryl.  Dermabond was  placed.  Catheter was filled with proper volumes of heparin.  The  patient tolerated the procedure well.        ______________________________  V. Leia Alf, MD  Electronically Signed     VWB/MEDQ  D:  03/30/2008  T:  03/30/2008  Job:  [id number]

## 2010-09-21 NOTE — Assessment & Plan Note (Signed)
OFFICE VISIT   RAYVN, STARK  DOB:  11-19-1921                                       09/15/2008  [id number]   REASON FOR VISIT:  Postop followup.   This is an 77 year old female who underwent a right wrist fistula that  did not mature.  Fistulogram showed a sclerotic, diminutive vein.  This  was not revised.  She then underwent a right upper arm fistula.  This  was performed on August 22, 2008.  She comes back in today for  evaluation.  There is a good thrill within her fistula.  I am optimistic  this will mature,  I am going to see her back in 1 month for a repeat  evaluation.   Eldridge Abrahams, MD  Electronically Signed   VWB/MEDQ  D:  09/15/2008  T:  09/16/2008  Job:  [id number]   cc:   Donato Heinz, M.D.

## 2010-09-24 NOTE — Op Note (Signed)
   NAME:  [name], [name]                       ACCOUNT NO.:  [id number]   MEDICAL RECORD NO.:  [id number]                   PATIENT TYPE:  AMB   LOCATION:  [facility]                                 FACILITY:  [facility]   PHYSICIAN:  Sigmund I. Gaynelle Arabian, M.D.         DATE OF BIRTH:  [birth year]   DATE OF PROCEDURE:  07/11/2002  DATE OF DISCHARGE:                                 OPERATIVE REPORT   PREPARATION:  After appropriate preanesthesia, the patient was brought to  the operating room, placed on the operating table in the dorsal supine  position where general LMA anesthesia was introduced. She was replaced in  dorsal lithotomy position and the pubis was prepped with Betadine solution  and draped in the usual fashion.   HISTORY:  The patient is many years status post left nephrectomy. Cystoscopy  has revealed that she has a left ureterocele, which may be obstructing. CT  shows a left posterior bladder mass, but there was no mucosal mass on  cystoscopy. She is now for unroofing of ureterocele. She has chronic  recurrent urinary tract infections.   DESCRIPTION OF PROCEDURE:  Cystoscopy was accomplished, and showed the left  ureterocele. The ureterocele was incised with the Collings knife, but is a  very thick mass. This was therefore resected in two bites, yielding a very  large and obstructed left ureteral orifice, which drained whitish mucosa,  and multiple black grains of stone material. The bladder was irrigated, and  25 cm areas of apparent inflammatory nodules are biopsied and sent to the  laboratory. No bleeding was noted. It was elected not to leave a Foley  catheter.   It is proven that the patient had obstructing ureterocele, and has a history  of nephrectomy, and probably has a longstanding infected ureteral segment.                                               Sigmund I. Gaynelle Arabian, M.D.    SIT/MEDQ  D:  07/11/2002  T:  07/11/2002  Job:  [id number]

## 2010-09-24 NOTE — Consult Note (Signed)
[name], [name] NO.:  [id number]   MEDICAL RECORD NO.:  [id number]          PATIENT TYPE:  INP   LOCATION:  5524                         FACILITY:  [facility]   PHYSICIAN:  Donato Heinz, M.D.DATE OF BIRTH:  [birth year]   DATE OF CONSULTATION:  12/15/2004  DATE OF DISCHARGE:                                   CONSULTATION   REASON FOR CONSULTATION:  Renal failure.   CONSULTING PHYSICIAN:  Cyril Mourning, D.O.   HISTORY OF PRESENT ILLNESS:  Ms. Kritzer is an 77 year old female with a  past medical history significant for solitary kidney status post left  nephrectomy in the 1970s according to the patient due to blood pressure as  well as longstanding hypertension and chronic kidney disease with a  creatinine of 3.7 in 1/06.  She was admitted due a rising creatinine over  the last several months as well as a three-month history of feeling weak,  tired, noticing an increased lower extremity edema over the last three  weeks, and an episode of nausea and vomiting on Sunday.  The trend in her  BUN and creatinine are as follows:  In 1/06, her BUN was 44, creatinine 3.7.  In 3/06, BUN 46, creatinine 4.  On 12/07/04, BUN 84, creatinine 10.8, December 13, 2004, BUN 85, creatinine 11.  Of note, her estimated creatinine clearance  with a serum creatinine of 4.7 was only 12 ml per minute.  With a creatinine  of 4, it was 11 mm per minute; and with 10 and above, it was 4 mm per  minute.  The patient was instructed to stop her Diovan on 12/11/04 with a  repeat yesterday and the above results.  She was then instructed to come to  the [facility] for further evaluation and treatment.  She denies  any nonsteroidal anti-inflammatory drug use or any recent antibiotics,  rashes, or new over-the-counter medications.  She does report feeling weak  and tired but no anorexia or change in her appetite.  She did have one  episode of nausea and vomiting on Sunday.   ALLERGIES:  Sulfa  causes hives.   PAST MEDICAL HISTORY:  1.  Hypertension for 40 years.  2. Status post left nephrectomy in the      19 70s, unclear reason.  3. Chronic kidney disease, stage IV, with a      baseline creatinine of 3.7 to 4 according to old written records.  4.      Hypercholesterolemia.  5. History of a TIA in 1990s.  6. History of      colon polyps.  7. Gout.   MEDICATIONS:  The patient does not know all of her medications and does not  have a list according to records available, and included:  1. Caduet  5/unknown dose of Lipitor.  2. Atenolol 100 mg at bedtime.  3. Cardura 4 mg  a day.  3. Imdur 30 mg a day.  4. Lasix 40 mg a day.   FAMILY HISTORY:  Mother died at age 66 from hypertension.  Father died at  age 56 from unknown causes.  She has four sisters.  All have hypertension.   SOCIAL HISTORY:  She is divorced, has three children, two sons, and one  daughter all in good health.  No tobacco and no alcohol.  She used to work  as a Secretary/administrator and she is originally from [place].   REVIEW OF SYSTEMS:  In general, she has had malaise and weakness for the  last three months.  Ophthalmic:  No blurred vision, photophobia.  Cardiac:  No chest pain, palpitations, orthopnea, PND.  Pulmonary:  No shortness of  breath, hemoptysis, productive cough.  GI:  She had one episode of nausea  and vomiting on Sunday, no hematochezia, melena, or bright red blood per  rectum.  GU:  No dysuria, pyuria, hematuria, urgency, frequency, or  retention, but does have a nocturia x 4.  She also notes some increasing  lower extremity edema over the last three weeks.  Rheumatologic:  No  arthralgias or myalgias.  Dermatologic:  No rashes, lumps, or bumps.  Hematologic:  No focal or asymmetric ataxia, weakness, and no loss of  consciousness.  All other systems negative.   PHYSICAL EXAMINATION:  This is a well-developed elderly female in no  apparent distress.  Blood pressure was 143/69, pulse 66, respiratory  rate  20, temperature 98.4.  HEENT:  Normocephalic and atraumatic.  Head:  Extraocular muscles intact.  No icterus.  Oropharynx was without lesions.  Neck was supple with full range of motion.  Lungs were clear to auscultation  with decreased breath sounds at the bases.  Cardiac exam regular rate and  rhythm __________. Abdominal exam:  Normal active bowel sounds, soft,  nontender, nondistended.  No guarding, no rebound, but she did have an  epigastric bruit.  Extremities:  She has 1+ pretibial edema bilaterally.  She had 1+ pedal pulses bilaterally.  No embolic changes.  No cyanosis.  Labs were pending.   ASSESSMENT AND PLAN:  1.  Acute-on-chronic renal failure.  Chronic kidney disease versus an acute      worsening from another etiology such as renal artery stenosis, acute      interstitial nephritis or glomerulonephritis.  Will check urinalysis,      urine sodium, creatinine, and urine eosinophils.  Will also check an      SPEP and UPEP to rule out myeloma given her anemia and progressive loss      of renal function.  May also want to proceed with an MRA to rule out      renal artery stenosis in the solitary kidney.  I discussed the severity      of her chronic kidney disease and informed her that, even with a      creatinine of 3.7 back in January, her GFR was less than 15 ml per      minute at that time consistent with severe advanced chronic kidney      disease.  __________ will have patient watch educational videos about      different modalities of renal replacement therapy and will continue with      our workup.  Will also save her left arm for a vascular access if she      decides to proceed with hemodialysis.  2. Hypertension.  Blood pressure      is stable.  Continue with her current regimen and continue to hold the      Diovan.  3. Anemia.  Her hemoglobin was 8.4.  will initiate Aranesp     therapy, check  iron studies, and guaiac stools.  4. Health care      maintenance.   Will screen for other complications of chronic kidney      disease including secondary hyperparathyroidism.  Will continue to      follow closely.  Thank you for this consultation.       JC/MEDQ  D:  12/15/2004  T:  12/16/2004  Job:  [id number]

## 2010-09-24 NOTE — Consult Note (Signed)
[name], [name] NO.:  [id number]   MEDICAL RECORD NO.:  [id number]          PATIENT TYPE:  INP   LOCATION:  [id number]                         FACILITY:  [facility]   PHYSICIAN:  Minus Breeding, M.D.   DATE OF BIRTH:  Feb 20, 1922   DATE OF CONSULTATION:  12/23/2004  DATE OF DISCHARGE:                                   CONSULTATION   CONSULTING SERVICE:  [facility].   PRIMARY CARE PHYSICIAN:  Dr. Levin Erp.   REASON FOR CONSULTATION:  Evaluate patient with chest pain and slightly  elevated cardiac enzymes.   HISTORY OF PRESENT ILLNESS:  The patient is a lovely 76 year old African  American female without prior cardiac history.  She has had chronic renal  insufficiency.  Recently, she has had lower extremity swelling.  She has  been found to have progressive renal failure, now requiring dialysis.  She  did have an outpatient echocardiogram which she reports is negative for any  abnormalities.  She has never had any other cardiac workup or history.   In the hospital, she was started on dialysis.  She was found to have  monoclonal IgA kappa paraprotein in the serum suspicious for IgA myeloma.  She is being followed by Dr. Jana Hakim now and is planned to have an  outpatient bone marrow biopsy.  She has tolerated dialysis well with  resolution of her lower extremity edema.   However, the patient did have left upper epigastric discomfort yesterday.  She points under her left lower ribs.  She said it was sharp.  It lasted for  a couple of minutes.  She said it was 6/10 in intensity.  There was no  radiation.  There was no associated nausea, vomiting, diaphoresis or  shortness of breath.  She got Maalox with resolution.  She has had no  recurrence of this.  She has never had symptoms similar to this.   The patient prior to this hospitalization was active.  She does all of her  chores of daily living including vacuuming.  With this, she does not get any  chest  discomfort, neck discomfort, arm discomfort.  She has no baseline  shortness of breath and denies PND or orthopnea.  She has no palpitations,  presyncope or syncope.   PAST MEDICAL HISTORY:  1.  Chronic renal insufficiency, now dialysis dependent (left nephrectomy in      the 1960s).  2.  Hypertension.  3.  Hyperlipidemia.  4.  History of TIA.  5.  History of gout.  6.  Benign bladder mass.  7.  Degenerative joint disease in the C-spine.  8.  Anemia of chronic disease.   PAST SURGICAL HISTORY:  Partial hysterectomy.   ALLERGIES:  SULFA.   MEDICATIONS:  1.  Aspirin 81 mg daily.  2.  Senna.  3.  Imdur 30 mg daily.  4.  Calcium carbonate.  5.  Nephro-Vite.  6.  Enoxaparin 30 mg q.24 Porter.  7.  Aranesp 100 mcg.  8.  Hectorol 4 mcg Monday, Wednesday and Friday.  9.  Norvasc 5 mg daily.  10. Atenolol 100 mg  daily.  11. Colchicine 0.6 mg b.i.d.  12. Indocin 50 mg b.i.d.  13. Protonix 40 mg daily.  14. IV iron.  15. Imdur 30 mg daily.   SOCIAL HISTORY:  The patient lives alone.  She has 3 children.  She is a  retired Secretary/administrator.  She has several grandchildren and great-grandchildren.   FAMILY HISTORY:  Family history is contributory for her mother dying with  complication of hypertension and diabetes at age 62.   REVIEW OF SYSTEMS:  Review of systems positive for fevers, positive for  recent nausea and vomiting, positive for weight loss with decreased  appetite.  Negative for other systems.   PHYSICAL EXAMINATION:  GENERAL:  The patient is pleasant and in no distress.  VITAL SIGNS:  Heart rate 58 and regular, respiratory rate 18, blood pressure  144/73, temperature 97.9, saturation 94% on room air.  HEENT:  Eyes unremarkable, pupils are equal, round and reactive to light,  fundi are visualized; oral mucosa unremarkable.  NECK:  No jugular venous distention, wave form within normal limits, carotid  upstroke brisk and symmetric, no bruits, no thyromegaly.  LYMPHATICS:  No  cervical, axillary or inguinal adenopathy.  LUNGS:  Clear to auscultation bilaterally.  BACK:  No costovertebral angle tenderness.  CHEST:  Unremarkable.  HEART:  PMI not displaced or sustained, S1 and S2 within normal limits, no  S3, no S4, no murmurs.  ABDOMEN:  Flat; positive bowel sounds, normal in frequency and pitch; no  bruits, no rebound, no guarding, no midline pulsatile mass, no hepatomegaly,  no splenomegaly.  SKIN:  No rashes, no nodules.  EXTREMITIES:  Pulses 2+ throughout, no edema, no cyanosis, no clubbing.  NEUROLOGIC:  Oriented to person, place and time; cranial nerves II-XII  grossly intact; motor grossly intact.   LABORATORY AND ACCESSORY CLINICAL DATA:  EKG:  Sinus arrhythmia, axis within  normal limits, intervals within normal limits, poor anterior R wave  progression, no acute ST-T wave changes, nonspecific inferior T wave  flattening.   CK-MB negative x3, troponin 0.05, 0.05, 0.06.  BUN 22, creatinine 5.0,  sodium 140, potassium 4.2; hemoglobin 9.8, WBC 4.5, platelets 179,000.   ASSESSMENT AND PLAN:  1.  The patient's chest discomfort is very atypical for angina.  She has no      history consistent with this.  She had no diagnostic EKG changes.  She      has nonspecific troponin elevation and complicated (made less specific)      by the presence of her renal failure.  At this time, the pretest      probability of obstructive coronary disease is low.  I do not think      catheterization is warranted.  We could consider an outpatient perfusion      study, but will defer to the oncologist and let them complete their      workup.  At this point, I would plan to see her back in the office.  She      can continue the medications currently listed, as they are good      antihypertensives and antianginals.  2.  Hypertension:  As above.  3.  Followup:  I will see her in the office after her discharge and make     further plans for any cardiology workup as  needed.           ______________________________  Minus Breeding, M.D.     JH/MEDQ  D:  12/23/2004  T:  12/24/2004  Job:  [id number]   cc:   Lance Muss, M.D.  [address] [place]  [place] [address]  Fax: [phone]

## 2010-09-24 NOTE — Op Note (Signed)
[name], MEREDITH NO.:  [id number]   MEDICAL RECORD NO.:  [id number]          PATIENT TYPE:  INP   LOCATION:  5524                         FACILITY:  [facility]   PHYSICIAN:  Rosetta Posner, M.D.    DATE OF BIRTH:  05/23/1921   DATE OF PROCEDURE:  12/17/2004  DATE OF DISCHARGE:                                 OPERATIVE REPORT   PREOPERATIVE DIAGNOSIS:  End-stage renal disease.   POSTOPERATIVE DIAGNOSIS:  End-stage renal disease.   PROCEDURE:  1.  Left wrist Cimino arteriovenous fistula.  2.  Right internal jugular Diatek placement with ultrasound guidance.   SURGEON:  Rosetta Posner, M.D.   ASSISTANT:  Doroteo Bradford, PA-C.   ANESTHESIA:  MAC.   COMPLICATIONS:  None.   DISPOSITION:  To PACU, stable.   PROCEDURE IN DETAIL:  The patient was taken the operating room and placed in  supine position, where the area of the left arm and left wrist were prepped  and draped in usual sterile fashion.  Using local anesthesia, an incision  was made from the level of the cephalic vein to the radial artery at the  wrist.  The cephalic vein was mobilized and was ligated and divided  distally.  This was mobilized the level of the radial artery.  The radial  artery was occluded proximally and distally and was opened with an 11 blade  and extended longitudinally with Potts scissors.  The vein was cut to the  appropriate, was spatulated and sewn end-to-side to the artery with a  running 6-0 Prolene suture.  Clamps were removed from the artery and  excellent thrill was noted.  The wound was irrigated with saline and  hemostased with electrocautery.  Wounds were closed with 3-0 Vicryl in the  subcutaneous and subcuticular tissue and Benzoin and Steri-Strips were  applied.  Next, the left and right neck were imaged with ultrasound,  revealing widely patent jugular veins bilaterally.  The patient was placed  into Trendelenburg position.  Using local anesthesia and a finder  needle,  the right internal jugular vein identified.  Next, using Seldinger  technique, guidewire was passed down to the level of the right atrium.  A  dilator and peel-away sheath were passed over the guidewire and the dilator  and guidewire were removed.  The 28-cm Diatek catheter was positioned down  to the level of the right atrium.  The peel-away sheath was then removed.  A  separate stab incision was made at the level of the clavicle and the  catheter was brought through a subcutaneous tunnel.  The 2 lumen ports were  attached and both lumens flushed and aspirated  easily ___________ mL heparin.  The catheter was secured to the skin with a  3-0 nylon stitch and the entry site was closed with a 4-0 subcuticular  Vicryl stitch.  A sterile dressing was applied and the patient was taken to  the recovery room, where chest x-ray was obtained.      Rosetta Posner, M.D.  Electronically Signed     TFE/MEDQ  D:  12/17/2004  T:  12/18/2004  Job:  [id number]

## 2010-09-24 NOTE — Discharge Summary (Signed)
[name], [name]             ACCOUNT NO.:  [id number]   MEDICAL RECORD NO.:  [id number]          PATIENT TYPE:  INP   LOCATION:  5524                         FACILITY:  [facility]   PHYSICIAN:  Mobolaji B. Bakare, M.D.DATE OF BIRTH:  1921-10-12   DATE OF ADMISSION:  12/15/2004  DATE OF DISCHARGE:  12/25/2004                                 DISCHARGE SUMMARY   PRIMARY CARE PHYSICIAN:  Lance Muss, M.D.   CONSULTS:  1.  Nephrology consult, Donato Heinz, M.D.  2.  Cardiology consult, Minus Breeding, M.D.  3.  Hematology consult, Virgie Dad. Magrinat, M.D.  4.  Surgical consult, Rosetta Posner, M.D.   PROCEDURES:  1.  Left wrist arteriovenous fistula.  2.  Right internal jugular Diatek catheter placement by Rosetta Posner, M.D.  3.  2-D echocardiogram showed normal left ventricular systolic function with      ejection fraction of 55-65%, features consistent with diastolic      dysfunction, mild MR.  Left atrium was mildly to moderately dilated.      Small circumferential pericardial effusion.  4.  Ultrasound of the kidneys showed markedly echogenic right kidney      compatible with advanced medicorenal disease.  There was no      hydronephrosis.  She is status post left nephrectomy.  5.  Radiological bone survey.  Negative plain film for multiple myeloma.      Left atrial enlargement.  Severe degenerative disease of the cervical      spine.  6.  X-ray of the left foot showed no bony abnormality.  There was soft      tissue swelling.  7.  Chest x-ray showed cardiomegaly without pulmonary edema, small left      pleural effusion.   FINAL DIAGNOSES:  1.  End-stage renal disease.  She was started on hemodialysis.  2.  Secondary hyperparathyroidism.  3.  Monoclonal IgA paraproteinemia.  4.  Chest pain.  5.  Gout.  6.  Normocytic anemia.   SECONDARY DIAGNOSIS:  1.  Hypertension.  2.  Hyperlipidemia.  3.  Status post left nephrectomy.   BRIEF HISTORY:  Ms. Marhefka is a  pleasant 77 year old African-American  female who is status post left nephrectomy in the 1970's.  There is  longstanding hypertension and chronic kidney disease.  She was admitted on  December 15, 2004, secondary to grossly elevated creatinine of 11.  This was  done as an outpatient, and the patient was sent to the hospital for further  evaluation.  BUN was 88.  Potassium was 4.8.  She does have chronic  longstanding hypertension.  In addition, the patient has been on Diovan for  renal protection.  She was hence admitted for further evaluation.   PERTINENT PHYSICAL FINDINGS:  VITAL SIGNS:  Initial vitals on admission were  blood pressure 156/77, temperature 98.3, heart rate 70, respiratory rate 18,  O2 saturation 94%.  GENERAL:  She was oriented to time, place, and person.  NECK:  Supple without any mass or thyromegaly.  LUNGS:  Some crackles in the left base.  There was no wheeze or rhonchi.  CARDIAC:  S1 and S2 with occasional ectopic beat.  ABDOMEN:  Unremarkable.  EXTREMITIES:  Bipedal edema.  Pulses were symmetrically palpable.  NEUROLOGIC:  There were no neurological deficits.   HOSPITAL COURSE:  End-stage renal disease.  In January, 2006, the patient's  creatinine was 3.7.  She was seen in consultation by Donato Heinz, M.D.  Appropriate investigations were ordered, and discussion was made with the  patient regarding options.  She agreed to hemodialysis.  Renal ultrasound  did not show any hydronephrosis.  The patient received sessions of  hemodialysis after a Diatek catheter was placed, and at the same time, she  had left wrist A-V fistula placed.   Workup revealed presence of monoclonal IgA kappa protein on serum  immunofixation;  hence, hematology consult was obtained.  She was seen by  Virgie Dad. Magrinat, M.D.  The patient had a bone survey which was negative;  however, the thought is that she probably has IgA myeloma.  She is scheduled  for an outpatient bone marrow  biopsy.  It was also felt that even if the  paraproteinemia was treated and a good control was achieved, there may not  be any significant improvement in the renal function or anemia.  The patient  will need to continue with hemodialysis.  She was started on Aranesp for  anemia.  She did receive one unit of packed red blood cells for a hemoglobin  of 7.4.  She had severe secondary hyperparathyroidism with a PTH of 1107,  phosphate of 6.7, and she was started on Tums and Hectorol.   Gout.  She developed gout during the course of hospitalization, and this  responded to Colchicine and Indomethacin.  Colchicine was discontinued when  the patient developed nausea and vomiting, and these symptoms subsided on  discontinuation of the Colchicine.  Hence, she was placed on allopurinol.  Acute gout resolved prior to discharge.   Chest pain.  She developed atypical chest pain during the course of  hemodialysis; however, given the history and risk factor, cardiology consult  was obtained for a possible stress test.  She was seen by Minus Breeding,  M.D.  The patient had a slightly elevated troponin, and decision was made to  follow up with Dr. Percival Spanish as an outpatient for possible stress test.   Hypertension.  This was fairly controlled during the hospitalization.  At  the time of discharge, blood pressure was in the range of 110-120/50-65.   DISCHARGE MEDICATIONS:  1.  Lipitor 10 mg p.o. daily.  2.  Allopurinol 100 mg once a day.  3.  Norvasc 5 mg once at bedtime.  4.  Atenolol 50 mg once a day.  5.  Aspirin 81 mg once a day.  6.  Calcium carbonate 2 tablets each meal three times a day.  7.  Hectorol 4 mcg IV on Tuesday, Thursday, and Saturday.  8.  InFed 100 mg IV once a week during hemodialysis.  9.  Imdur 30 mg once at bedtime.  10. Nephrovite 1 tablet p.o. daily.  11. The patient was instructed to stop Lasix, Diovan, and Cardura.   FOLLOW UP: 1.  Dialysis on Tuesday, Thursday, and  Saturday at [facility] at noon ([phone]      [phone]).  2.  Virgie Dad. Magrinat, M.D. at [facility] for bone marrow biopsy      on January 06, 2005 at 7 a.m.  3.  Minus Breeding, M.D., [facility], on January 20, 2005 at 45  a.m.  4.  Appointment with Virgie Dad. Magrinat, M.D. on February 01, 2005 at 9:30      a.m.  5.  Lance Muss, M.D. in 1-2 weeks.   WOUND CARE:  Catheter dressing to be changed at each dialysis visit.   PERTINENT LABORATORY FINDINGS:  Renal function on discharge:  Sodium 134,  potassium 3.8, chloride 98, CO2 25, glucose 163, BUN 55, creatinine 0.4,  albumin 2.7, chloride 8.7, phosphorus 3.6.  Fasting lipid profile:  Cholesterol 171, triglyceride 137, HDL 34, LDL 110.  Cardiac panel:  Troponin 0.06 x2 with normal CK-MB and CK.  Magnesium was 2.7, LDH 243  normal, uric acid 4.4.  Urine protein electrophoresis:  Total protein 3670.  There was high free kappa light chain with free kappa antibody of 80.5.  Free lambda light chain was elevated at 3.11, reference range of 0.082-1.01.  Urine immunofixation shows monoclonal IgA heavy chain with associated kappa  light chain and excess monoclonal free kappa light chains.  Serum IgA 539,  normal range 68-378.  Serum IgG immunoglobulin 557, low.  IgM 39, low.  Serum immunofixation showed monoclonal IgA kappa protein present.  Ferritin  142, iron 43, total iron binding capacity 202, saturation 21.  Urinalysis  showed protein greater than 300, small blood, trace leukocyte.  Microscopy  was unremarkable.      Mobolaji B. Maia Petties, M.D.  Electronically Signed     MBB/MEDQ  D:  01/02/2005  T:  01/02/2005  Job:  [id number]   cc:   Donato Heinz, M.D.  [address]  [place]  [place] [address]  Fax: [phone]   Virgie Dad. Magrinat, M.D.  [address] [place] [address]  Fax: [phone]   Lance Muss, M.D.  [address]  [place]  [place] [address]  Fax: [phone]   Minus Breeding, M.D.   [address]  [address]  [place]  [place] [address]   Rosetta Posner, M.D.  [address]  [place]  [place] [address]

## 2010-09-24 NOTE — Consult Note (Signed)
[name], [name]             ACCOUNT NO.:  [id number]   MEDICAL RECORD NO.:  [id number]          PATIENT TYPE:  INP   LOCATION:  5524                         FACILITY:  [facility]   PHYSICIAN:  Virgie Dad. Magrinat, M.D.DATE OF BIRTH:  Dec 27, 1921   DATE OF CONSULTATION:  12/21/2004  DATE OF DISCHARGE:                                   CONSULTATION   [name] is an 77 year old [place] woman with a history of remote  left nephrectomy now with worsening renal function requiring hemodialysis.  As part of her workup for her worsening renal function, she was found to  have a monoclonal IgA kappa paraprotein in the serum. The total IgA was 539,  the total IgG 557 and the total IgM of 39. She also had a urine paraprotein  with a kappa light chain accounting for 80 mg daily of the 3.7 grams of  protein excretion daily. We were consulted for further evaluation and  treatment of possible myeloma.   The patient has already had a bone survey which was negative. Her other  medical problems in a full data base are dictated separately. I believe Ms.  Boyan very likely does have IgA myeloma despite the negative bone survey  but she will need a bone marrow biopsy for definitive diagnosis. This is  going to be performed in patient this hospitalization or as an outpatient if  her discharge is imminent. Once the results of that biopsy are available,  then we can consider therapy probably to at least include thalidomide plus  or minus other drugs.   I do not believe even if we can achieve good paraprotein control in this  patient she will have significant improvement in her renal function or  anemia and therefore I agree with the renal plan for hemodialysis, Aranesp  and  of course their electrolyte evaluation and management as already planned. I  will arrange for the patient's bone marrow biopsy and outpatient follow-up  as far as the myeloma is concerned. All this has been discussed with  the  patient this evening.      Virgie Dad. Magrinat, M.D.  Electronically Signed     GCM/MEDQ  D:  12/21/2004  T:  12/21/2004  Job:  [id number]   cc:   [facility]   Sherril Croon, M.D.  [place]  [place] [address]  Fax: [phone]   Lance Muss, M.D.  [address] [place]  [place] [address]  Fax: [phone]

## 2010-09-24 NOTE — H&P (Signed)
NAMETAMITRA, BRAY NO.:  [id number]   MEDICAL RECORD NO.:  [id number]          PATIENT TYPE:  INP   LOCATION:  5524                         FACILITY:  [facility]   PHYSICIAN:  Cyril Mourning, D.O.    DATE OF BIRTH:  1921/05/30   DATE OF ADMISSION:  12/15/2004  DATE OF DISCHARGE:                                HISTORY & PHYSICAL   PRIMARY CARE PHYSICIAN:  Lance Muss, M.D.   CHIEF COMPLAINT:  Abnormal labs.   HISTORY OF PRESENTING ILLNESS:  [name] is a pleasant,  independently-living, 77 year old, African American female with longstanding  history of chronic renal insufficiency, status post nephrectomy back in the  1960s for reasons I am not absolutely certain of and the family is unable to  provide me full details; however, records from primary care physician's  office, Dr. Levin Porter, reveal that she has had creatinine levels as high  as 3.7 back in January 2006.  She has developed some edema over the past  week, and she has undergone attempts at outpatient diuresis and she is noted  to have on lab work today, through [facility], a creatinine of 11.0.  Her  potassium was 4.8, and she is noted to be hemodynamically stable and  currently in no acute respiratory distress at this time.  She is being  admitted for further evaluation management and nephrology consultation as  per primary M.D. request.   PAST MEDICAL HISTORY:  1.  Longstanding hypertension.  2.  Chronic renal insufficiency.  3.  Status post left nephrectomy.  4.  She has had a recent ultrasound.  These results are available at the end      of this dictation.  5.  History of TIA.  6.  History of gout.  7.  Hypercholesterolemia.  8.  She has had colonoscopy, in May 2004, with the next colonoscopy      scheduled for May 2009.  9.  She had a benign bladder mass in July 2004.  10. She has undergone evaluation by [facility] recently and      according to the patient, she was  reported no problems.  I do not have      the documentation from the visit with Dr. Dannielle Porter.  However, she has seen      him in the past.   SURGICAL HISTORY:  1.  She retains her gallbladder.  2.  She states that she has had a nephrectomy.  3.  She has also had a partial hysterectomy.  4.  Otherwise she reports no other surgical procedures.   MEDICATIONS:  As obtained by calling her [facility] on [address].  They reported that she was taking:  1.  Lipitor 10 mg daily.  2.  Diovan 160 mg daily.  3.  Lasix 40 mg daily.  4.  Cardura 8 mg daily.  5.  Atenolol 100 mg daily.  6.  Norvasc 5 mg daily.   ALLERGIES:  She reports an allergy to SULFA.   SOCIAL HISTORY:  She formerly worked in [employer].  She is separated  from  her husband.  She currently lives alone and her son is quite attentive to  her needs and checks in on her from time to time.  His name is [name].  He is reachable at phone number [phone].  She denied any alcohol  or tobacco.  She has three children.   FAMILY HISTORY:  Her mother died at age 22 with hypertension and diabetes.  Father died at age 40, she is unable to describe the cause.   REVIEW OF SYSTEMS:  She has had some nausea, vomiting.  No hematemesis or  hematochezia.  She is anorexic.  She has felt unwell and has been having  swelling of her lower extremities and generalized edema that has been  refractory to outpatient management.  She denies any chest pain.  Otherwise  she reports some loose stools though further review of systems is  unremarkable.   PHYSICAL EXAMINATION:  VITAL SIGNS:  In the emergency department she was  evaluated and she had an initial blood pressure of 156/77, temperature 98.3,  heart rate 70, respirations 18, O2 saturation 94%.  GENERAL:  The patient is alert.  She is oriented, though she is quite a poor  historian.  She is answering questions appropriately.  HEENT:  Head is normocephalic/atraumatic.   Extraocular muscles intact.  NECK:  Supple, nontender.  No palpable thyromegaly or mass.  CARDIOVASCULAR:  Reveals a normal S1 S2 with occasional ectopic beat.  PULMONARY:  Reveals slight crackles at the left base.  She exhibits normal  respiratory effort.  There is no dullness to percussion.  ABDOMEN:  Soft, nontender.  There is no rebound.  No suprapubic or  costovertebral angle tenderness.  EXTREMITIES:  Lower extremities reveal bipedal edema.  Peripheral pulses are  symmetrical and palpable throughout.  NEUROLOGIC:  As noted above, the patient is euthymic.   LABORATORY DATA:  These are all provided from primary care physician's  office.  There are no lab data available for review from [facility]  system at this time.  Her hemoglobin was 8.3, WBC was 4.1, platelet count  was 169.  Sodium was 137, potassium 4.8, chloride 103, CO2 20, BUN 88,  creatinine 11.0, calcium 7.3.  She did have a renal ultrasound that revealed  markedly echogenic right kidney compatible with advanced medical renal  disease.  There is no hydronephrosis and there is status post left  nephrectomy evident.   ASSESSMENT:  1.  Acute on chronic renal insufficiency with a recent discontinuation of      Diovan without improvement.  2.  Status post left nephrectomy.  3.  Hypertension.  4.  Anasarca.  5.  Hypercholesterolemia.   PLAN:  1.  At this time we are going to admit [name] to medical/surgical      floor.  2.  Follow her course clinically.  3.  Attempt gentle IV diuresis.  4.  Follow her renal function with avoidance of nephrotoxic agents.  5.  We will ask [facility] to see her as well.      Cyril Mourning, D.O.  Electronically Signed     ESS/MEDQ  D:  12/15/2004  T:  12/15/2004  Job:  [id number]   cc:   Lance Muss, M.D.  [address] [place]  [place] [address]  Fax: [phone]

## 2010-09-24 NOTE — Consult Note (Signed)
[name], [name]             ACCOUNT NO.:  [id number]   MEDICAL RECORD NO.:  [id number]          PATIENT TYPE:  INP   LOCATION:  5524                         FACILITY:  [facility]   PHYSICIAN:  Judeth Cornfield. Scot Dock, M.D.DATE OF BIRTH:  [birth year]   DATE OF CONSULTATION:  12/16/2004  DATE OF DISCHARGE:                                   CONSULTATION   REASON FOR CONSULTATION:  Need for hemodialysis access.   HISTORY:  This is a pleasant 77 year old right-handed woman with a long  history of chronic renal insufficiency. She was admitted with abnormal labs  and was felt that she would need dialysis during this admission and vascular  surgery was consulted for hemodialysis access. She was otherwise  asymptomatic with exception having some edema over the last few weeks.   PAST MEDICAL HISTORY:  1.  Significant for chronic renal insufficiency.  2.  Hypertension.  3.  Gout.  4.  Hypercholesterolemia.   PAST SURGICAL HISTORY:  1.  Significant for status post left nephrectomy.  2.  Status post colonoscopy.  3.  Status post partial hysterectomy.   Medications and review of systems are documented on admission history and  physical. On social history, she does not use tobacco. She has three  children.   PHYSICAL EXAMINATION:  Blood pressure is 145/78, temperature is 98.2, heart  rate 67. Lungs are clear bilaterally to auscultation. She does not have a  pacemaker or clavicle fractures. She has a palpable brachial, radial pulse  bilaterally. She appears to have a usable forearm cephalic vein.   I have recommend we place a Diatek catheter so dialysis can be begun  tomorrow. We will also place a AV fistula or AV graft. I have explained the  forearm vein is not adequate. She could potentially have an upper arm  fistula if needed or adequately we would place an AV graft. I have discussed  the indications for surgery and the potential complications including but  not limited to graft  thrombosis, graft infection, steel syndrome, wound  healing problems, and edema. All her questions were answered and she is  agreeable to proceed. Her surgery has been scheduled for tomorrow.      CSD/MEDQ  D:  12/16/2004  T:  12/17/2004  Job:  [id number]

## 2010-09-24 NOTE — Assessment & Plan Note (Signed)
[facility]                               PULMONARY OFFICE NOTE   NAME:BURNETTMarim, Porter                    MRN:          [id number]  DATE:04/03/2006                            DOB:          1921/10/29    I saw [name] in followup today for her chronic cough.  Since her last  visit, she had undergone pulmonary function tests on October 29th, which  showed suggestion of a mild restricted defect on her spirometry, no  bronchodilator response and mild decrease in her diffusion capacity.  She  says that she had started using Prilosec and feels that this has improved  her symptoms to some degree, although she is still having episodes of  coughing but not nearly as bad as before.  She said she will usually get a  cough sometime in the morning around 9 or 9:30 and then again in the evening  at about 6:30 or 7:00.  Interestingly enough, she says that she usually eats  breakfast at about 9:00 and dinner at about 6 or 6:30.  Therefore, I am  concerned if there is maybe relationship to the timing of her meals and her  cough symptoms.  She says that she is also taking her Prilosec in the  evening time.  She has not had any other significant changes in her health  and her medications since her last visit.   PHYSICAL EXAMINATION:  VITAL SIGNS:  Temperature 97.9, blood pressure  146/80, heart rate 71, oxygen saturation 95% on room air.  HEENT:  There is no sinus tenderness.  No nasal discharge.  No oral lesions.  NECK:  No lymphadenopathy.  HEART:  S1 and S2.  Regular rhythm.  CHEST:  Clear to auscultation.  ABDOMEN:  Soft, nontender, positive bowel sounds.  EXTREMITIES:  No edema.   IMPRESSION:  Chronic cough, which may be related to reflux disease.   At this time, I have advised her that she should use her Prilosec, about 30-  45 minutes prior to her first meal of the day.  She is to continue on her  dietary modifications.  I will then plan on following  up with her in  approximately 2-3 months.  At that time, if she is still having persistent  symptoms of cough, then consideration may need to be given to having her  evaluated by gastroenterology to determine if further interventions will  need to be done with regards to her reflux symptoms.     Chesley Mires, MD  Electronically Signed    VS/MedQ  DD: 04/03/2006  DT: 04/03/2006  Job #: [id number]   cc:   [name], M.D.

## 2010-09-24 NOTE — Consult Note (Signed)
[name], [name]             ACCOUNT NO.:  [id number]   MEDICAL RECORD NO.:  [id number]          PATIENT TYPE:  INP   LOCATION:  5524                         FACILITY:  [facility]   PHYSICIAN:  Virgie Dad. Magrinat, M.D.DATE OF BIRTH:  [birth year]   DATE OF CONSULTATION:  12/21/2004  DATE OF DISCHARGE:                                   CONSULTATION   CONSULTING PHYSICIAN:  Dr. Jana Hakim   REASON FOR CONSULTATION:  Monocular abnormality.   REFERRING PHYSICIAN:  Dr. Marval Regal   HISTORY OF PRESENT ILLNESS:  Ms. Onorato is a pleasant 77 year old  [place] woman with a history of ESRD status post nephrectomy in 1960s  admitted on December 15, 2004 with elevated creatinine levels from Dr. Zella Ball office for evaluation and management.  Her renal ultrasound was  positive for advanced renal disease but no hydronephrosis was seen.  She  then underwent a renal evaluation, began dialysis December 20, 2004.  UA was  noticed to have elevated protein count, 300.  She also was found to have a  monocular IgA kappa paraprotein in the serum.  The total IgA was 539 and the  total IgG was 557 with a total IgM of 39.  She also had a urine paraprotein  with kappa light chain accounting for 80 mg daily of the 3.7 g protein  excretion daily.  We were consulted for further evaluation and treatment,  rule out myeloma.  Of note, she underwent a bone survey today which is  negative for any myeloma findings.   PAST MEDICAL HISTORY:  1.  ESRD status post left nephrectomy as above.  2.  Hypertension.  3.  History of TIA.  4.  History of gout.  5.  Hypercholesterolemia.  6.  Benign bladder mass since July of 2004.  7.  Anemia of chronic disease.  8.  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   SURGERY:  1.  Status post left wrist AVF/right IJ Diatek, Dr. Donnetta Hutching December 17, 2004.  2.  Status post left nephrectomy.  3.  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 Gaynelle Arabian 2004.  4.  Status post  partial hysterectomy, remote.   ALLERGIES:  SULFA.   CURRENT MEDICATIONS:  1.  Norvasc 5 mg q.h.s.  2.  Aspirin 81 mg daily.  3.  Atenolol 100 mg q.h.s.  4.  Calcium carbonate two p.o. t.i.d.  5.  Colchicine 0.6 mg b.i.d.  6.  Imdur 30 mg daily.  7.  Aranesp 100 mcg every Friday, first dose on December 17, 2004.  8.  Cardura 4 mg every night.  9.  Hectorol 4 mcg every Monday, Wednesday, and Friday.  10. Lovenox 30 mg subcutaneous q.24h.  11. Indocin 50 mg b.i.d. and 50 mg t.i.d.  12. InFeD 100 mg as directed during the hospitalization.  13. Senokot p.r.n.  14. Protonix 40 mg daily.  15. Nephro-Vite daily.  16. Zofran p.r.n.  17. Percocet p.r.n.   REVIEW OF SYSTEMS:  Remarkable for acute fever, fatigue, weight loss.  She  cannot state how much weight she did lose.  No headaches or shortness  of  breath.  No dyspnea on exertion.  She does have some yellow sputum during  cough.  Mild nausea.  No vomiting.  She had loose stools during the  admission.  No dysuria.  She has noticed edema over the last few months.  She also experienced head trauma on August 11 at the dialysis center with no  neurologic deficits.   FAMILY HISTORY:  Mother died with hypertension and diabetes at 15.  Father  died at 63, unknown cause.  She has three sisters in their 16s with no  cancer, alive and well.   SOCIAL HISTORY:  The patient is divorced.  She has one son, Truddie Crumble, main  caretaker as well as two other children in good health.  She is a retired  Secretary/administrator.  She lives in [place].  No alcohol or tobacco history.  She  has high school education.  She lived in [place] most of her life.  Her  last colonoscopy was in May of 2004, screening negative.  Her last mammogram  was in April 2005, negative.  Last transfusion on December 16, 2004 for  hemoglobin of 7.4.   PHYSICAL EXAMINATION:  GENERAL:  This is an 77 year old African-American  female in no acute distress, alert and oriented x3.  VITAL  SIGNS:  Blood pressure 124/48, pulse 61, respirations 18, temperature  98.8, pulse oximetry 96% room air, weight 152 pounds, height 71 inches.  HEENT:  Remarkable for a right forehead area of trauma, raised which  occurred on August 11 at the dialysis center.  No thrush.  No lesions in her  oral cavity.  NECK:  Supple.  No cervical or supraclavicular masses.  LUNGS:  Trace of crackles bilaterally, otherwise essentially unremarkable.  No axillary masses.  CARDIOVASCULAR:  Regular rate and rhythm without murmurs, rubs, or gallops.  ABDOMEN:  Soft, nontender.  Bowel sounds x4.  No palpable spleen or liver.  GENITOURINARY:  Deferred.  RECTAL:  Deferred.  EXTREMITIES:  No clubbing.  No cyanosis.  Mild edema at the right ankle and  the left knee.  Right IJ catheter is seen with no visible infection around.  NEUROLOGIC:  Essentially nonfocal.   LABORATORIES:  Hemoglobin 11.1, hematocrit 32.7.  On admission it was 8.3,  her hemoglobin.  White count 4.4, platelets 170, MCV 87.3.  PT 15.1, PTT 38,  INR 1.2.  Sodium 140, potassium 4, BUN 25, on admission was 88, creatinine  5.5, was 11 on admission.  No LFTs are available at this time.  Calcium 8.7,  magnesium 2.1, phosphate 2.4.  Hepatitis B surface negative.  PTH 1107.3.   ASSESSMENT/PLAN:  Dr. Jana Hakim has seen and evaluated the patient and the  chart has been reviewed.  According to Dr. Jana Hakim, this is very likely  that despite a negative bone survey she may have IgA myeloma but she will  need a bone marrow biopsy for definite diagnosis.  Once the results of the  biopsy are available therapy may be considered including thalidomide plus or  minus other drugs.  Please refer to Dr. Virgie Dad note on December 21, 2004  for further details on her plan after she is discharged.  Thank you very  much for allowing [place] the opportunity to participate in Ms. Hemenway's care.      Sharene Butters, P.A.     Virgie Dad. Magrinat, M.D.  Electronically  Signed    SW/MEDQ  D:  12/22/2004  T:  12/22/2004  Job:  [id number]   cc:  Lance Muss, M.D.  [address] [place]  [place] [address]  Fax: [phone]   Sherril Croon, M.D.  [place]  [place] [address]  Fax: [phone]   Rosetta Posner, M.D.  [place]  [place] [address]   Pierre Bali I. Gaynelle Arabian, M.D.  [place]. [address] [place]  [place] [address]  Fax: [phone]

## 2010-09-24 NOTE — Assessment & Plan Note (Signed)
[facility]                               PULMONARY OFFICE NOTE   RALPHINE, CAMMER                    MRN:          [id number]  DATE:03/03/2006                            DOB:          [birth year]    I met Ms. Knab today for evaluation of her cough. She says that she has  had this for approximately the last two months. She does not recall having  anything happen to her two months that would have initiated it.  Specifically, she denied having any exposures, or any infections at that  time. She says that she will get a cough during the day, as well as night.  It is occasionally productive of clear sputum, although she said two days  ago in dialysis she had some slight streaking of blood in the sputum. She  denies having any headaches, fevers, chills, sweats or weight loss. She  denies having any symptoms of allergies, although she is on Clarinex. She  also denies having symptoms of nasal congestion or postnasal drip. She says  she does occasionally get a bitter taste in her mouth, and she was told by  one of the dialysis nurses that she may have had some wheezing at one point.  She did not have a globus sensation and she denies having the need to clear  her throat. She is not currently taking any medicines for her stomach. She  does not have any difficulty as far as getting short of breath with exertion  and she denies any history of eczema.  She apparently was on a blood  pressure medicine approximately two years ago which caused her to have a  cough, but that was discontinued at that time. She has tried several cough  medicines, as well as antihistamines without much relief of her symptoms of  coughing. She does not have any significant exposures to animals or birds.  She does not have any tobacco exposure. She used to work as a Secretary/administrator,  but does not have any recent any occupational exposures.  She had a chest x-  ray done on January 23, 2006, which was essentially normal.  She has not  had pulmonary function tests done previously.   PAST MEDICAL HISTORY:  Significant for hypertension, and end-stage renal  disease for which she is on dialysis   PAST SURGICAL HISTORY:  Significant for a left nephrectomy and hysterectomy.   MEDICATIONS:  1. Lipitor 10 mg daily.  2. Aspirin 81 mg daily.  3. Dialyvite 800 mg daily.  4. A stool softener once daily.  5. PhosLo 667 mg t.i.d.  6. Robitussin.  7. Benzonatate.  8. Clarinex.  9. Benadryl.  10.Tylenol.  11.Endocet.   ALLERGIES:  SULFA which causes her to develop hives and a rash.   SOCIAL HISTORY:  She is divorced. She is a retired Secretary/administrator. There is no  history of tobacco or alcohol use. She currently lives alone.   FAMILY HISTORY:  Noncontributory.   REVIEW OF SYSTEMS:  Essentially negative except for stated above.   PHYSICAL EXAMINATION:  VITAL SIGNS: Height 5'4, weight  159, temperature  98.2, blood pressure 130/72, heart rate 75, oxygen saturation 94%.  HEENT: Pupils reactive.  Extraocular muscles are intact.  There is no sinus  tenderness.  She has mildly prominent nasal turbinates  but no nasal  discharge. She has a Mallampati 2 airway with scalloped borders of her  tongue, and a slight overbite.  NECK: There is no lymphadenopathy or thyromegaly.  HEART: S1, S2.  CHEST: Clear at auscultation.  ABDOMEN: Soft, nontender, positive bowel sounds.  EXTREMITIES: No edema cyanosis or clubbing.  NEUROLOGIC: No focal deficits were appreciated.   IMPRESSION:  Chronic cough.  She has an essentially normal chest x-ray and  no history of tobacco exposure.  In this setting, the three likely  possibilities would be postnasal drip, asthma or reflux.  She does not have  much along the symptoms of postnasal drip, although again, she is on  Clarinex but does not seem to have gained any benefit from this. She did  apparently have some episodes of wheezing in addition to  her coughing  previously and therefore, the possibility of asthma could be a concern.  Additionally, she did describe having a bitter taste in her mouth and this  could be significant for reflux disease. She also did relate that she was  having difficulty with her sleep and she does have physical findings in her  upper airway anatomy which would be consistent with possible sleep apnea as  well. This would be particularly germane as it relates to the possibility of  reflux disease.   PLAN:  At this point, what I will do is make arrangements for her to undergo  pulmonary function tests to further evaluate the possibility of asthma. I  will also start her on Prilosec 20 mg daily to see if this can help with  regards to possible reflux symptoms. I have also discussed with her dietary  modifications, and I have also discussed that she may at some point need to  undergo further evaluation for possible obstructive sleep apnea. Depending  on her response to this, I would determine if she would need to have any  further treatment interventions.  In the meantime, I have also discussed  with her the use of using a sugarless candy to keep her mouth moistened, as  well as changing her cough suppressant to Delsym for the next few days as  well.  I will have her followup with me in about three to four weeks to  assess her response to the above measures.     Chesley Mires, MD    VS/MedQ  DD: 03/03/2006  DT: 03/06/2006  Job #: [id number]   cc:   Donato Heinz, M.D.

## 2010-09-24 NOTE — Op Note (Signed)
[name], [name]             ACCOUNT NO.:  [id number]   MEDICAL RECORD NO.:  [id number]          PATIENT TYPE:  AMB   LOCATION:  SDS                          FACILITY:  [facility]   PHYSICIAN:  Rosetta Posner, M.D.    DATE OF BIRTH:  1921-08-11   DATE OF PROCEDURE:  02/11/2005  DATE OF DISCHARGE:                                 OPERATIVE REPORT   PREOPERATIVE DIAGNOSIS:  End-stage renal disease.   POSTOPERATIVE DIAGNOSIS:  End-stage renal disease.   PROCEDURE:  Creation of left upper arm arteriovenous fistula.   SURGEON:  Rosetta Posner, MD   ASSISTANT:  Jadene Pierini, PA-C.   ANESTHESIA:  MAC.   COMPLICATIONS:  None.   DISPOSITION:  To recovery room, stable.   PROCEDURE IN DETAIL:  The patient was taken to the operating room and placed  in supine position, where the area of the left arm was prepped and draped in  usual sterile fashion.  Using local anesthesia, an incision was made over  the antecubital space and carried down to isolate the cephalic vein and the  brachial artery.  The cephalic vein was of excellent caliber when it was  dilated up with heparinized saline.  The vein was ligated distally and  divided, and was mobilized to the level of the brachial artery.  The  brachial artery was occluded proximally and distally and was opened with an  11 blade and extended longitudinally with Potts scissors.  The vein was cut  to the appropriate length and was spatulated and sewn end-to-side to the  artery with a running 6-0 Prolene suture.  Clamps were removed and a good  thrill was noted.  The wound was irrigated with saline and hemostased with  electrocautery.  The wounds were closed with 3-0 Vicryl in the subcutaneous  and subcuticular tissue and Benzoin and Steri-Strips were applied.      Rosetta Posner, M.D.  Electronically Signed     TFE/MEDQ  D:  02/11/2005  T:  02/11/2005  Job:  [id number]

## 2010-09-24 NOTE — Op Note (Signed)
[name], [name]             ACCOUNT NO.:  [id number]   MEDICAL RECORD NO.:  [id number]          PATIENT TYPE:  AMB   LOCATION:  SDS                          FACILITY:  [facility]   PHYSICIAN:  Judeth Cornfield. Scot Dock, M.D.DATE OF BIRTH:  [birth year]   DATE OF PROCEDURE:  06/10/2005  DATE OF DISCHARGE:                                 OPERATIVE REPORT   PREOPERATIVE DIAGNOSIS:  Poorly functioning left upper arm AV fistula.   POSTOPERATIVE DIAGNOSIS:  Poorly functioning left upper arm AV fistula.   PROCEDURE:  Revision of left AV fistula by ligating a competing branch.   SURGEON:  Judeth Cornfield. Scot Dock, M.D.   ASSISTANT:  Faylene Million Dominick, PA.   ANESTHESIA:  Local with sedation.   TECHNIQUE:  The patient was taken to the operating room and sedated by  anesthesia. The left upper extremity was prepped and draped in the usual  sterile fashion. After the skin was anesthetized with 1% lidocaine, a small  transverse incision was made over the branch which could be easily  identified on exam. This was dissected out and divided between two 2-0 silk  ties. Hemostasis was obtained in the wound and the wound was closed with a  deep layer of 3-0 Vicryl, the skin closed with 4-0 Vicryl. A sterile  dressing was applied and the patient tolerated the procedure well and was  transferred to the recovery room in satisfactory condition. All needle and  sponge counts were correct.      Judeth Cornfield. Scot Dock, M.D.  Electronically Signed     CSD/MEDQ  D:  06/10/2005  T:  06/10/2005  Job:  [id number]

## 2010-09-29 ENCOUNTER — Encounter: Payer: Self-pay | Admitting: Pulmonary Disease

## 2010-09-29 ENCOUNTER — Ambulatory Visit (INDEPENDENT_AMBULATORY_CARE_PROVIDER_SITE_OTHER): Payer: Medicare Other | Admitting: Pulmonary Disease

## 2010-09-29 ENCOUNTER — Ambulatory Visit
Admission: RE | Admit: 2010-09-29 | Discharge: 2010-09-29 | Disposition: A | Discharge status: Home / Self Care | Payer: Medicare Other | Source: Ambulatory Visit | Attending: Internal Medicine | Admitting: Internal Medicine

## 2010-09-29 VITALS — BP 126/66 | HR 80 | Temp 98.2°F | Ht 66.0 in | Wt 154.8 lb

## 2010-09-29 DIAGNOSIS — Z1231 Encounter for screening mammogram for malignant neoplasm of breast: Secondary | ICD-10-CM

## 2010-09-29 DIAGNOSIS — R05 Cough: Secondary | ICD-10-CM

## 2010-09-29 MED ORDER — BENZONATATE 100 MG PO CAPS: ORAL_CAPSULE | ORAL | Status: DC

## 2010-09-29 NOTE — Assessment & Plan Note (Addendum)
She has not responded to aggressive asthma therapy, sinus therapy, or anti-reflux therapy.  She is quite distress about what is causing her cough.  To further assess will arrange for CT sinus and chest.  Depending on results she may need bronchoscopy.  She may also need methacholine challenge to further assess asthma.  In meantime will have her stop symbicort and flonase.  She can continue proair and benzonatate prn.

## 2010-09-29 NOTE — Progress Notes (Signed)
Subjective:    Patient ID: [name], female    DOB: 1922/02/26, 77 y.o.   MRN: [id number]  HPI [id number] Coladonato, [name]  77 yo female never smoker with chronic cough.  Her cough is the same.  She has a dry cough usually.  She will get a cough during the night with some sputum, that is clear.  She is not having sinus congestion or reflux symptoms.  She is not wheezing.  She does not think that symbicort or flonase have helped.  Her RAST from March was negative.  She does feel like benzonatate helps some.  Past Medical History  Diagnosis Date  . HTN (hypertension)   . ESRD on hemodialysis   . Mild aortic stenosis   . Dyslipidemia   . TIA (transient ischemic attack)   . Gout   . Benign bladder mass   . DJD (degenerative joint disease)   . Anemia   . Obstructive sleep apnea     mild  . Chronic cough      Family History  Problem Relation Age of Onset  . Hypertension Mother   . Diabetes Mother      History   Social History  . Marital Status: Widowed    Spouse Name: N/A    Number of Children: N/A  . Years of Education: N/A   Occupational History  . Retired     Secretary/administrator   Social History Main Topics  . Smoking status: Never Smoker   . Smokeless tobacco: Never Used  . Alcohol Use: Not on file  . Drug Use: Not on file  . Sexually Active: Not on file   Other Topics Concern  . Not on file   Social History Narrative  . No narrative on file     Allergies  Allergen Reactions  . Sulfa Antibiotics   . Sulfonamide Derivatives      Outpatient Prescriptions Prior to Visit  Medication Sig Dispense Refill  . albuterol (PROAIR HFA) 108 (90 BASE) MCG/ACT inhaler 1-2 puffs every 4-6 hours as needed       . aspirin 81 MG tablet Take 81 mg by mouth daily.        Marland Kitchen atorvastatin (LIPITOR) 10 MG tablet Take 10 mg by mouth at bedtime.        . benzonatate (TESSALON) 100 MG capsule One twice per day as needed for cough  30 capsule  1  . budesonide-formoterol  (SYMBICORT) 160-4.5 MCG/ACT inhaler Inhale 2 puffs into the lungs 2 (two) times daily.        . calcium acetate (PHOSLO) 667 MG capsule 4 capsules daily      . fluticasone (FLONASE) 50 MCG/ACT nasal spray 2 sprays by Nasal route daily.  16 g  12  . Multiple Vitamin (MULTIVITAMIN) tablet Take 1 tablet by mouth daily.        . traMADol (ULTRAM) 50 MG tablet One by mouth two times a day as needed        Review of Systems    Objective:   Physical Exam Filed Vitals:   09/29/10 1438  BP: 126/66  Pulse: 80  Temp: 98.2 F (36.8 C)  TempSrc: Oral  Height: 5\' 6"  (1.676 m)  Weight: 154 lb 12.8 oz (70.217 kg)  SpO2: 96%   Constitutional: She is oriented to person, place, and time. She appears well-developed and well-nourished.  Mouth/Throat: Oropharynx is clear and moist. No oropharyngeal exudate.  Clear nasal discharge Eyes: Left eye exhibits no discharge.  No scleral icterus.  Neck: No JVD present.  Cardiovascular: Normal rate and regular rhythm. No murmur heard.  Pulmonary/Chest: Effort normal and breath sounds normal. She has no wheezes. She has no rales.  Musculoskeletal: She exhibits no edema.  Lymphadenopathy: She has no cervical adenopathy.  Neurological: She is alert and oriented to person, place, and time. No cranial nerve deficit.  Skin: Skin is warm and dry.     Assessment & Plan:   Cough She has not responded to aggressive asthma therapy, sinus therapy, or anti-reflux therapy.  She is quite distress about what is causing her cough.  To further assess will arrange for CT sinus and chest.  Depending on results she may need bronchoscopy.  She may also need methacholine challenge to further assess asthma.  In meantime will have her stop symbicort and flonase.  She can continue proair and benzonatate prn.    Updated Medication List Outpatient Encounter Prescriptions as of 09/29/2010  Medication Sig Dispense Refill  . albuterol (PROAIR HFA) 108 (90 BASE) MCG/ACT inhaler 1-2 puffs  every 4-6 hours as needed       . aspirin 81 MG tablet Take 81 mg by mouth daily.        Marland Kitchen atorvastatin (LIPITOR) 10 MG tablet Take 10 mg by mouth at bedtime.        . benzonatate (TESSALON) 100 MG capsule One twice per day as needed for cough  30 capsule  2  . calcium acetate (PHOSLO) 667 MG capsule 4 capsules daily      . Multiple Vitamin (MULTIVITAMIN) tablet Take 1 tablet by mouth daily.        . traMADol (ULTRAM) 50 MG tablet One by mouth two times a day as needed       . DISCONTD: benzonatate (TESSALON) 100 MG capsule One twice per day as needed for cough  30 capsule  1  . DISCONTD: budesonide-formoterol (SYMBICORT) 160-4.5 MCG/ACT inhaler Inhale 2 puffs into the lungs 2 (two) times daily.        Marland Kitchen DISCONTD: fluticasone (FLONASE) 50 MCG/ACT nasal spray 2 sprays by Nasal route daily.  16 g  12

## 2010-09-29 NOTE — Patient Instructions (Signed)
Stop flonase and symbicort Continue proair two puffs as needed Will schedule CT sinus and chest Follow up in 4 weeks

## 2010-10-06 ENCOUNTER — Ambulatory Visit (INDEPENDENT_AMBULATORY_CARE_PROVIDER_SITE_OTHER)
Admission: RE | Admit: 2010-10-06 | Discharge: 2010-10-06 | Disposition: A | Discharge status: Home / Self Care | Payer: Medicare Other | Source: Ambulatory Visit | Attending: Pulmonary Disease | Admitting: Pulmonary Disease

## 2010-10-06 DIAGNOSIS — R05 Cough: Secondary | ICD-10-CM

## 2010-10-14 ENCOUNTER — Telehealth: Payer: Self-pay | Admitting: Pulmonary Disease

## 2010-10-14 NOTE — Telephone Encounter (Signed)
Attempted to call pt, but no answer.  Will have my nurse inform pt that CT chest and sinus were unremarkable.  Specifically, nothing seen to explain her cough.  Also allergy test was normal.  Will discuss further at next visit, but no change to current treatment plan.

## 2010-10-15 NOTE — Telephone Encounter (Signed)
[id number] x1 

## 2010-10-25 ENCOUNTER — Other Ambulatory Visit (HOSPITAL_COMMUNITY): Payer: Self-pay | Admitting: Nephrology

## 2010-10-25 DIAGNOSIS — N186 End stage renal disease: Secondary | ICD-10-CM

## 2010-10-29 ENCOUNTER — Other Ambulatory Visit (HOSPITAL_COMMUNITY): Payer: Self-pay | Admitting: Nephrology

## 2010-10-29 ENCOUNTER — Ambulatory Visit (HOSPITAL_COMMUNITY)
Admission: RE | Admit: 2010-10-29 | Discharge: 2010-10-29 | Disposition: A | Discharge status: Home / Self Care | Payer: Medicare Other | Source: Ambulatory Visit | Attending: Nephrology | Admitting: Nephrology

## 2010-10-29 DIAGNOSIS — N186 End stage renal disease: Secondary | ICD-10-CM

## 2010-10-29 DIAGNOSIS — T82898A Other specified complication of vascular prosthetic devices, implants and grafts, initial encounter: Secondary | ICD-10-CM | POA: Insufficient documentation

## 2010-10-29 DIAGNOSIS — Y832 Surgical operation with anastomosis, bypass or graft as the cause of abnormal reaction of the patient, or of later complication, without mention of misadventure at the time of the procedure: Secondary | ICD-10-CM | POA: Insufficient documentation

## 2010-10-29 MED ORDER — IOHEXOL 300 MG/ML  SOLN
100.0000 mL | Freq: Once | INTRAMUSCULAR | Status: AC | PRN
  Administered 2010-10-29: 50 mL via INTRAVENOUS

## 2010-11-01 NOTE — Telephone Encounter (Signed)
Pt aware of CT chest and sinus results per Dr. Halford Chessman.

## 2010-12-22 ENCOUNTER — Encounter: Payer: Self-pay | Admitting: Pulmonary Disease

## 2010-12-22 ENCOUNTER — Ambulatory Visit (INDEPENDENT_AMBULATORY_CARE_PROVIDER_SITE_OTHER): Payer: Medicare Other | Admitting: Pulmonary Disease

## 2010-12-22 VITALS — BP 132/64 | HR 83 | Temp 98.0°F | Ht 65.0 in | Wt 155.2 lb

## 2010-12-22 DIAGNOSIS — R05 Cough: Secondary | ICD-10-CM

## 2010-12-22 MED ORDER — CHLORPHENIRAMINE MALEATE 4 MG PO TABS: ORAL_TABLET | ORAL | Status: DC

## 2010-12-22 NOTE — Assessment & Plan Note (Signed)
Improved.  She can continue her current regimen.  Can also use chlorpheniramine as needed at night if her cough gets worse.

## 2010-12-22 NOTE — Progress Notes (Signed)
  Subjective:    Patient ID: [name], female    DOB: Oct 02, 1921, 77 y.o.   MRN: [id number]  HPI [id number] Coladonato  77 yo female never smoker with chronic cough.  Better cough since June, still has sputum clear, more at night. Denies fever, chest pain/tightness, wheeze.  Past Medical History  Diagnosis Date  . HTN (hypertension)   . ESRD on hemodialysis   . Mild aortic stenosis   . Dyslipidemia   . TIA (transient ischemic attack)   . Gout   . Benign bladder mass   . DJD (degenerative joint disease)   . Anemia   . Obstructive sleep apnea     mild  . Chronic cough     Review of Systems     Objective:   Physical Exam BP 132/64  Pulse 83  Temp(Src) 98 F (36.7 C) (Oral)  Ht 5\' 5"  (1.651 m)  Wt 155 lb 3.2 oz (70.398 kg)  BMI 25.83 kg/m2  SpO2 95%  Constitutional: She is oriented to person, place, and time. She appears well-developed and well-nourished.  Mouth/Throat: Oropharynx is clear and moist. No oropharyngeal exudate. Clear nasal discharge  Eyes: Left eye exhibits no discharge. No scleral icterus.  Neck: No JVD present.  Cardiovascular: Normal rate and regular rhythm. No murmur heard.  Pulmonary/Chest: Effort normal and breath sounds normal. She has no wheezes. She has no rales.  Musculoskeletal: She exhibits no edema.  Lymphadenopathy: She has no cervical adenopathy.  Neurological: She is alert and oriented to person, place, and time. No cranial nerve deficit.  Skin: Skin is warm and dry.      Assessment & Plan:   Cough Improved.  She can continue her current regimen.  Can also use chlorpheniramine as needed at night if her cough gets worse.    Updated Medication List Outpatient Encounter Prescriptions as of 12/22/2010  Medication Sig Dispense Refill  . albuterol (PROAIR HFA) 108 (90 BASE) MCG/ACT inhaler 1-2 puffs every 4-6 hours as needed       . aspirin 81 MG tablet Take 81 mg by mouth daily.        Marland Kitchen atorvastatin (LIPITOR) 10 MG tablet Take  10 mg by mouth at bedtime.        . benzonatate (TESSALON) 100 MG capsule One twice per day as needed for cough  30 capsule  2  . calcium acetate (PHOSLO) 667 MG capsule 4 capsules daily      . Multiple Vitamin (MULTIVITAMIN) tablet Take 1 tablet by mouth daily.        . traMADol (ULTRAM) 50 MG tablet One by mouth two times a day as needed       . chlorpheniramine (CHLOR-TRIMETON) 4 MG tablet One pill at night as needed for cough  30 tablet  0

## 2010-12-22 NOTE — Patient Instructions (Signed)
Can use chlorpheniramine 4 mg as needed at night if cough gets worse Follow up in 6 months

## 2010-12-27 ENCOUNTER — Ambulatory Visit
Admission: RE | Admit: 2010-12-27 | Discharge: 2010-12-27 | Disposition: A | Discharge status: Home / Self Care | Payer: Medicare Other | Source: Ambulatory Visit | Attending: Nephrology | Admitting: Nephrology

## 2010-12-27 ENCOUNTER — Other Ambulatory Visit: Payer: Self-pay | Admitting: Nephrology

## 2010-12-27 DIAGNOSIS — M79603 Pain in arm, unspecified: Secondary | ICD-10-CM

## 2010-12-27 DIAGNOSIS — M542 Cervicalgia: Secondary | ICD-10-CM

## 2011-02-02 LAB — POCT I-STAT 4, (NA,K, GLUC, HGB,HCT)
Glucose, Bld: 91
Operator id: 136761
Potassium: 5
Sodium: 132 — ABNORMAL LOW

## 2011-02-09 LAB — POCT I-STAT, CHEM 8
BUN: 68 — ABNORMAL HIGH
Calcium, Ion: 1.01 — ABNORMAL LOW
Chloride: 107
Creatinine, Ser: 12.5 — ABNORMAL HIGH
Glucose, Bld: 86
TCO2: 25

## 2011-02-09 LAB — POTASSIUM: Potassium: 5.2 — ABNORMAL HIGH

## 2011-02-11 LAB — POCT I-STAT 4, (NA,K, GLUC, HGB,HCT)
HCT: 43 % (ref 36.0–46.0)
Hemoglobin: 14.6 g/dL (ref 12.0–15.0)

## 2011-02-21 LAB — ABO/RH: ABO/RH(D): A POS

## 2011-02-21 LAB — TYPE AND SCREEN

## 2011-02-21 LAB — CBC
HCT: 34 — ABNORMAL LOW
Hemoglobin: 11.1 — ABNORMAL LOW
RBC: 3.48 — ABNORMAL LOW
RDW: 15.9 — ABNORMAL HIGH
WBC: 5.3

## 2011-02-21 LAB — APTT: aPTT: >200

## 2011-02-23 LAB — POCT I-STAT 4, (NA,K, GLUC, HGB,HCT)
Glucose, Bld: 88
Operator id: 274481
Potassium: 4.2

## 2011-02-24 LAB — BASIC METABOLIC PANEL
CO2: 31
Chloride: 95 — ABNORMAL LOW
Creatinine, Ser: 8.19 — ABNORMAL HIGH
GFR calc Af Amer: 6 — ABNORMAL LOW
Glucose, Bld: 87

## 2011-02-24 LAB — DIFFERENTIAL
Basophils Absolute: 0
Basophils Relative: 1
Eosinophils Absolute: 0
Monocytes Absolute: 0.4
Monocytes Relative: 9

## 2011-02-24 LAB — CBC
Hemoglobin: 12.3
MCHC: 32.8
MCV: 96.3
RBC: 3.91
RDW: 15.6 — ABNORMAL HIGH

## 2011-02-28 ENCOUNTER — Ambulatory Visit
Admission: RE | Admit: 2011-02-28 | Discharge: 2011-02-28 | Disposition: A | Discharge status: Home / Self Care | Payer: Medicare Other | Source: Ambulatory Visit | Attending: Nephrology | Admitting: Nephrology

## 2011-02-28 ENCOUNTER — Other Ambulatory Visit: Payer: Self-pay | Admitting: Nephrology

## 2011-02-28 DIAGNOSIS — R05 Cough: Secondary | ICD-10-CM

## 2011-02-28 DIAGNOSIS — R509 Fever, unspecified: Secondary | ICD-10-CM

## 2011-05-04 ENCOUNTER — Other Ambulatory Visit (HOSPITAL_COMMUNITY): Payer: Self-pay | Admitting: Nephrology

## 2011-05-04 DIAGNOSIS — Z992 Dependence on renal dialysis: Secondary | ICD-10-CM

## 2011-05-11 ENCOUNTER — Other Ambulatory Visit (HOSPITAL_COMMUNITY): Payer: Self-pay | Admitting: Nephrology

## 2011-05-11 ENCOUNTER — Ambulatory Visit (HOSPITAL_COMMUNITY)
Admission: RE | Admit: 2011-05-11 | Discharge: 2011-05-11 | Disposition: A | Discharge status: Home / Self Care | Payer: Medicare Other | Source: Ambulatory Visit | Attending: Nephrology | Admitting: Nephrology

## 2011-05-11 DIAGNOSIS — N186 End stage renal disease: Secondary | ICD-10-CM | POA: Insufficient documentation

## 2011-05-11 DIAGNOSIS — G4733 Obstructive sleep apnea (adult) (pediatric): Secondary | ICD-10-CM | POA: Insufficient documentation

## 2011-05-11 DIAGNOSIS — T82898A Other specified complication of vascular prosthetic devices, implants and grafts, initial encounter: Secondary | ICD-10-CM | POA: Insufficient documentation

## 2011-05-11 DIAGNOSIS — I120 Hypertensive chronic kidney disease with stage 5 chronic kidney disease or end stage renal disease: Secondary | ICD-10-CM | POA: Insufficient documentation

## 2011-05-11 DIAGNOSIS — Y832 Surgical operation with anastomosis, bypass or graft as the cause of abnormal reaction of the patient, or of later complication, without mention of misadventure at the time of the procedure: Secondary | ICD-10-CM | POA: Insufficient documentation

## 2011-05-11 DIAGNOSIS — I871 Compression of vein: Secondary | ICD-10-CM | POA: Insufficient documentation

## 2011-05-11 MED ORDER — MIDAZOLAM HCL 2 MG/2ML IJ SOLN
INTRAMUSCULAR | Status: AC
  Filled 2011-05-11: qty 4

## 2011-05-11 MED ORDER — IOHEXOL 300 MG/ML  SOLN
100.0000 mL | Freq: Once | INTRAMUSCULAR | Status: AC | PRN
  Administered 2011-05-11: 50 mL via INTRAVENOUS

## 2011-05-11 MED ORDER — MIDAZOLAM HCL 5 MG/5ML IJ SOLN
INTRAMUSCULAR | Status: AC | PRN
  Administered 2011-05-11 (×2): 1 mg via INTRAVENOUS

## 2011-05-11 MED ORDER — FENTANYL CITRATE 0.05 MG/ML IJ SOLN
INTRAMUSCULAR | Status: AC
  Filled 2011-05-11: qty 4

## 2011-05-11 MED ORDER — FENTANYL CITRATE 0.05 MG/ML IJ SOLN
INTRAMUSCULAR | Status: AC | PRN
  Administered 2011-05-11: 25 ug via INTRAVENOUS
  Administered 2011-05-11: 50 ug via INTRAVENOUS

## 2011-05-11 NOTE — Procedures (Signed)
Right arm shuntogram demonstrated severe outflow stenosis in right upper arm.  Successful balloon angioplasty of outflow stenosis without immediate complication.

## 2011-05-11 NOTE — ED Notes (Signed)
Pt's son here as caregiver.  Discharge instructions reviewed with both the pt and her son.

## 2011-05-11 NOTE — H&P (Signed)
[name] is an 78 y.o. female.   Chief Complaint: decreased access flows in right arm dialysis graft [id number] with ESRD and stenotic right arm AVGG presents today for angioplasty/possible stenting of AVGG.  Past Medical History  Diagnosis Date  . HTN (hypertension)   . ESRD on hemodialysis   . Mild aortic stenosis   . Dyslipidemia   . TIA (transient ischemic attack)   . Gout   . Benign bladder mass   . DJD (degenerative joint disease)   . Anemia   . Obstructive sleep apnea     mild  . Chronic cough     Past Surgical History  Procedure Date  . Nephrectomy     left  . Total abdominal hysterectomy   . Bone marrow biopsy   . Left arm dialysis graft     Family History  Problem Relation Age of Onset  . Hypertension Mother   . Diabetes Mother    Social History:  reports that she has never smoked. She has never used smokeless tobacco. Her alcohol and drug histories not on file.  Allergies:  Allergies  Allergen Reactions  . Sulfa Antibiotics   . Sulfonamide Derivatives   HYPAFIX TAPE  Medications Prior to Admission  Medication Sig Dispense Refill  . albuterol (PROAIR HFA) 108 (90 BASE) MCG/ACT inhaler 1-2 puffs every 4-6 hours as needed       . aspirin 81 MG tablet Take 81 mg by mouth daily.        Marland Kitchen atorvastatin (LIPITOR) 10 MG tablet Take 10 mg by mouth at bedtime.        . benzonatate (TESSALON) 100 MG capsule One twice per day as needed for cough  30 capsule  2  . calcium acetate (PHOSLO) 667 MG capsule 4 capsules daily      . chlorpheniramine (CHLOR-TRIMETON) 4 MG tablet One pill at night as needed for cough  30 tablet  0  . Multiple Vitamin (MULTIVITAMIN) tablet Take 1 tablet by mouth daily.        . traMADol (ULTRAM) 50 MG tablet One by mouth two times a day as needed        No current facility-administered medications on file as of 05/11/2011.    No results found for this or any previous visit (from the past 48 hour(s)). No results found.  Review  of Systems  Constitutional: Negative for fever and chills.  Respiratory: Negative for cough and shortness of breath.   Cardiovascular: Negative for chest pain.  Gastrointestinal: Negative for nausea, vomiting and abdominal pain.  Neurological: Negative for headaches.  Endo/Heme/Allergies: Does not bruise/bleed easily.   VITALS: BP 153/65  HR 67  R 15  O2 SATS 100% 2 LITERS N/C Physical Exam  Constitutional: She is oriented to person, place, and time. She appears well-developed and well-nourished.  Cardiovascular: Normal rate and regular rhythm.   Respiratory: Effort normal and breath sounds normal.  GI: Soft. Bowel sounds are normal. She exhibits no distension. There is no tenderness.  Musculoskeletal: Normal range of motion.  Neurological: She is alert and oriented to person, place, and time.     Assessment/Plan Patient with ESRD and stenotic right arm AVGG; plan is for angioplasty/possible stenting of AVGG.  Coury Grieger,D KEVIN 05/11/2011, 2:33 PM

## 2011-05-13 NOTE — H&P (Signed)
Agree with PA note. 

## 2011-07-20 ENCOUNTER — Encounter: Payer: Self-pay | Admitting: Pulmonary Disease

## 2011-07-20 ENCOUNTER — Ambulatory Visit (INDEPENDENT_AMBULATORY_CARE_PROVIDER_SITE_OTHER): Payer: Medicare Other | Admitting: Pulmonary Disease

## 2011-07-20 VITALS — BP 142/64 | HR 84 | Temp 98.4°F | Ht 65.0 in | Wt 156.4 lb

## 2011-07-20 DIAGNOSIS — R059 Cough, unspecified: Secondary | ICD-10-CM

## 2011-07-20 DIAGNOSIS — R05 Cough: Secondary | ICD-10-CM

## 2011-07-20 DIAGNOSIS — J310 Chronic rhinitis: Secondary | ICD-10-CM

## 2011-07-20 NOTE — Patient Instructions (Signed)
Stop flovent Continue symbicort two puffs twice per day Proair two puffs as needed for cough, wheeze, or chest congestion Follow up in 6 months

## 2011-07-20 NOTE — Assessment & Plan Note (Signed)
Not currently an active issue.

## 2011-07-20 NOTE — Progress Notes (Signed)
Chief Complaint  Patient presents with  . Follow-up    6 month followup. Patient states "much better" since last visit. Cough is better. Denies sob, chest tightness, chest pain, and wheezing.    [id number] Coladonato  History of Present Illness: Bernardette LANIESHA Porter is a 78 y.o. female never smoker with chronic cough.  Her cough is much better.  She does not have wheezing anymore.  She is not having any trouble with her sinuses.  She has symbicort and flovent.  She has not needed to use proair much.  She is not using benzonatate much anymore.   Past Medical History  Diagnosis Date  . HTN (hypertension)   . ESRD on hemodialysis   . Mild aortic stenosis   . Dyslipidemia   . TIA (transient ischemic attack)   . Gout   . Benign bladder mass   . DJD (degenerative joint disease)   . Anemia   . Obstructive sleep apnea     mild  . Chronic cough     Past Surgical History  Procedure Date  . Nephrectomy     left  . Total abdominal hysterectomy   . Bone marrow biopsy   . Left arm dialysis graft     Allergies  Allergen Reactions  . Sulfa Antibiotics   . Sulfonamide Derivatives     Physical Exam:  Blood pressure 142/64, pulse 84, temperature 98.4 F (36.9 C), temperature source Oral, height 5\' 5"  (1.651 m), weight 156 lb 6.4 oz (70.943 kg), SpO2 95.00%. Body mass index is 26.03 kg/(m^2). Wt Readings from Last 2 Encounters:  07/20/11 156 lb 6.4 oz (70.943 kg)  12/22/10 155 lb 3.2 oz (70.398 kg)    General - no distress  HEENT - no sinus tenderness, no oral exudate, no LAN Cardiac - s1s2 regular Chest - no wheeze/rales Abdomen - soft, nontender Extremities - no edema Skin - no rashes Neurologic - normal strength Psychiatric - normal mood, behavior   Assessment/Plan:  Outpatient Encounter Prescriptions as of 07/20/2011  Medication Sig Dispense Refill  . aspirin 81 MG tablet Take 81 mg by mouth daily.        Marland Kitchen atorvastatin (LIPITOR) 10 MG tablet Take 10 mg by mouth at  bedtime.        . benzonatate (TESSALON) 100 MG capsule One twice per day as needed for cough  30 capsule  2  . calcium acetate (PHOSLO) 667 MG capsule 4 capsules daily      . Multiple Vitamin (MULTIVITAMIN) tablet Take 1 tablet by mouth daily.        Marland Kitchen albuterol (PROAIR HFA) 108 (90 BASE) MCG/ACT inhaler 1-2 puffs every 4-6 hours as needed       . budesonide-formoterol (SYMBICORT) 160-4.5 MCG/ACT inhaler Inhale 2 puffs into the lungs 2 (two) times daily.      . fluticasone (FLOVENT HFA) 110 MCG/ACT inhaler Inhale 1 puff into the lungs 2 (two) times daily.      Marland Kitchen DISCONTD: chlorpheniramine (CHLOR-TRIMETON) 4 MG tablet One pill at night as needed for cough  30 tablet  0  . DISCONTD: SENSIPAR 30 MG tablet       . DISCONTD: traMADol (ULTRAM) 50 MG tablet One by mouth two times a day as needed         Ohanna Gassert Pager:  [phone] 07/20/2011, 2:13 PM

## 2011-07-20 NOTE — Assessment & Plan Note (Signed)
Likely related to asthma and post-nasal drip.  She has been doing well with symbicort and prn proair.  She can continue benzonatate as needed.  Advised her not to use flovent while using symbicort.

## 2011-09-22 ENCOUNTER — Other Ambulatory Visit (HOSPITAL_COMMUNITY): Payer: Self-pay | Admitting: Nephrology

## 2011-09-22 DIAGNOSIS — N186 End stage renal disease: Secondary | ICD-10-CM

## 2011-09-26 ENCOUNTER — Ambulatory Visit (HOSPITAL_COMMUNITY): Admission: RE | Admit: 2011-09-26 | Payer: Medicare Other | Source: Ambulatory Visit

## 2011-09-28 ENCOUNTER — Telehealth (HOSPITAL_COMMUNITY): Payer: Self-pay | Admitting: *Deleted

## 2011-10-03 ENCOUNTER — Ambulatory Visit (HOSPITAL_COMMUNITY): Payer: Medicare Other

## 2011-10-07 ENCOUNTER — Ambulatory Visit (HOSPITAL_COMMUNITY)
Admission: RE | Admit: 2011-10-07 | Discharge: 2011-10-07 | Disposition: A | Discharge status: Home / Self Care | Payer: Medicare Other | Source: Ambulatory Visit | Attending: Nephrology | Admitting: Nephrology

## 2011-10-07 ENCOUNTER — Other Ambulatory Visit (HOSPITAL_COMMUNITY): Payer: Self-pay | Admitting: Nephrology

## 2011-10-07 VITALS — BP 147/50 | HR 64 | Resp 20

## 2011-10-07 DIAGNOSIS — T82898A Other specified complication of vascular prosthetic devices, implants and grafts, initial encounter: Secondary | ICD-10-CM | POA: Insufficient documentation

## 2011-10-07 DIAGNOSIS — Z992 Dependence on renal dialysis: Secondary | ICD-10-CM | POA: Insufficient documentation

## 2011-10-07 DIAGNOSIS — N186 End stage renal disease: Secondary | ICD-10-CM | POA: Insufficient documentation

## 2011-10-07 DIAGNOSIS — I359 Nonrheumatic aortic valve disorder, unspecified: Secondary | ICD-10-CM | POA: Insufficient documentation

## 2011-10-07 DIAGNOSIS — I120 Hypertensive chronic kidney disease with stage 5 chronic kidney disease or end stage renal disease: Secondary | ICD-10-CM | POA: Insufficient documentation

## 2011-10-07 DIAGNOSIS — Y849 Medical procedure, unspecified as the cause of abnormal reaction of the patient, or of later complication, without mention of misadventure at the time of the procedure: Secondary | ICD-10-CM | POA: Insufficient documentation

## 2011-10-07 MED ORDER — IOHEXOL 300 MG/ML  SOLN
100.0000 mL | Freq: Once | INTRAMUSCULAR | Status: AC | PRN
  Administered 2011-10-07: 50 mL via INTRAVENOUS

## 2011-10-07 MED ORDER — FENTANYL CITRATE 0.05 MG/ML IJ SOLN
INTRAMUSCULAR | Status: AC
  Filled 2011-10-07: qty 4

## 2011-10-07 MED ORDER — MIDAZOLAM HCL 5 MG/5ML IJ SOLN
INTRAMUSCULAR | Status: AC | PRN
  Administered 2011-10-07: 1 mg via INTRAVENOUS

## 2011-10-07 MED ORDER — MIDAZOLAM HCL 2 MG/2ML IJ SOLN
INTRAMUSCULAR | Status: AC
  Filled 2011-10-07: qty 4

## 2011-10-07 MED ORDER — FENTANYL CITRATE 0.05 MG/ML IJ SOLN
INTRAMUSCULAR | Status: AC | PRN
  Administered 2011-10-07: 50 ug via INTRAVENOUS

## 2011-10-07 NOTE — Discharge Instructions (Signed)
Moderate Sedation, Adult Moderate sedation is given to help you relax or even sleep through a procedure. You may remain sleepy, be clumsy, or have poor balance for several hours following this procedure. Arrange for a responsible adult, family member, or friend to take you home. A responsible adult should stay with you for at least 24 hours or until the medicines have worn off.  Do not participate in any activities where you could become injured for the next 24 hours, or until you feel normal again. Do not:   Drive.   Swim.   Ride a bicycle.   Operate heavy machinery.   Cook.   Use power tools.   Climb ladders.   Work at heights.   Do not make important decisions or sign legal documents until you are improved.   Vomiting may occur if you eat too soon. When you can drink without vomiting, try water, juice, or soup. Try solid foods if you feel little or no nausea.   Only take over-the-counter or prescription medications for pain, discomfort, or fever as directed by your caregiver.If pain medications have been prescribed for you, ask your caregiver how soon it is safe to take them.   Make sure you and your family fully understands everything about the medication given to you. Make sure you understand what side effects may occur.   You should not drink alcohol, take sleeping pills, or medications that cause drowsiness for at least 24 hours.   If you smoke, do not smoke alone.   If you are feeling better, you may resume normal activities 24 hours after receiving sedation.   Keep all appointments as scheduled. Follow all instructions.   Ask questions if you do not understand.  SEEK MEDICAL CARE IF:   Your skin is pale or bluish in color.   You continue to feel sick to your stomach (nauseous) or throw up (vomit).   Your pain is getting worse and not helped by medication.   You have bleeding or swelling.   You are still sleepy or feeling clumsy after 24 hours.  SEEK IMMEDIATE  MEDICAL CARE IF:   You develop a rash.   You have difficulty breathing.   You develop any type of allergic problem.   You have a fever.  Document Released: 01/18/2001 Document Revised: 04/14/2011 Document Reviewed: 06/11/2007 [employer] Patient Information 2012 [employer]. 

## 2011-10-07 NOTE — H&P (Signed)
[name] is an 78 y.o. female.   Chief Complaint: here for shunt evaluation HPI: esrd, decreased access flow rates  Past Medical History  Diagnosis Date  . HTN (hypertension)   . ESRD on hemodialysis   . Mild aortic stenosis   . Dyslipidemia   . TIA (transient ischemic attack)   . Gout   . Benign bladder mass   . DJD (degenerative joint disease)   . Anemia   . Obstructive sleep apnea     mild  . Chronic cough     Past Surgical History  Procedure Date  . Nephrectomy     left  . Total abdominal hysterectomy   . Bone marrow biopsy   . Left arm dialysis graft     Family History  Problem Relation Age of Onset  . Hypertension Mother   . Diabetes Mother    Social History:  reports that she has never smoked. She has never used smokeless tobacco. Her alcohol and drug histories not on file.  Allergies:  Allergies  Allergen Reactions  . Sulfa Antibiotics   . Sulfonamide Derivatives      (Not in a hospital admission)  No results found for this or any previous visit (from the past 48 hour(s)). No results found.  Review of Systems  Constitutional: Negative for fever and chills.  Respiratory: Negative for cough.   Cardiovascular: Negative for chest pain and palpitations.  Gastrointestinal: Negative for heartburn and nausea.  Neurological: Negative for headaches.    There were no vitals taken for this visit. Physical Exam  Constitutional: She is oriented to person, place, and time. She appears well-developed and well-nourished. No distress.  Cardiovascular: Normal rate and regular rhythm.   Respiratory: Effort normal and breath sounds normal. No respiratory distress.  GI: Soft. Bowel sounds are normal.  Neurological: She is alert and oriented to person, place, and time.  Skin: Skin is warm and dry.  Psychiatric: She has a normal mood and affect.     Assessment/Plan shuntogram with angioplasty and or stent placement  Monico Sudduth T. 10/07/2011, 8:39  AM

## 2011-10-07 NOTE — Procedures (Signed)
Successful RUE AVG shunt 5 and 39mm pta No comp Stable

## 2011-10-10 ENCOUNTER — Telehealth (HOSPITAL_COMMUNITY): Payer: Self-pay | Admitting: *Deleted

## 2011-10-10 NOTE — Telephone Encounter (Signed)
Radiology post procedure call. Patient states doing fine, no questions or concerns.

## 2012-01-20 ENCOUNTER — Ambulatory Visit (INDEPENDENT_AMBULATORY_CARE_PROVIDER_SITE_OTHER): Payer: Medicare Other | Admitting: Pulmonary Disease

## 2012-01-20 ENCOUNTER — Ambulatory Visit: Payer: Medicare Other | Admitting: Pulmonary Disease

## 2012-01-20 ENCOUNTER — Encounter: Payer: Self-pay | Admitting: Pulmonary Disease

## 2012-01-20 ENCOUNTER — Ambulatory Visit (INDEPENDENT_AMBULATORY_CARE_PROVIDER_SITE_OTHER)
Admission: RE | Admit: 2012-01-20 | Discharge: 2012-01-20 | Disposition: A | Discharge status: Home / Self Care | Payer: Medicare Other | Source: Ambulatory Visit | Attending: Pulmonary Disease | Admitting: Pulmonary Disease

## 2012-01-20 VITALS — BP 130/70 | HR 70 | Temp 98.0°F | Ht 64.0 in | Wt 157.8 lb

## 2012-01-20 DIAGNOSIS — R05 Cough: Secondary | ICD-10-CM

## 2012-01-20 DIAGNOSIS — R059 Cough, unspecified: Secondary | ICD-10-CM

## 2012-01-20 DIAGNOSIS — R042 Hemoptysis: Secondary | ICD-10-CM

## 2012-01-20 MED ORDER — PREDNISONE 5 MG PO TABS: ORAL_TABLET | ORAL | Status: DC

## 2012-01-20 MED ORDER — AZITHROMYCIN 250 MG PO TABS: ORAL_TABLET | ORAL | Status: AC

## 2012-01-20 MED ORDER — BENZONATATE 100 MG PO CAPS: ORAL_CAPSULE | ORAL | Status: DC

## 2012-01-20 NOTE — Progress Notes (Signed)
Chief Complaint  Patient presents with  . Follow-up    c/o cough w/ white phlem-worse from last visit-occasionally her nurse will hear her wheezing. breathing has been fine.   . Medication Refill    tessalon pearls    [id number] [place]  History of Present Illness: [name] is a 78 y.o. female never smoker with chronic cough.  She has noticed her cough getting worse over the past few months.  She is bringing up clear to yellow green sputum.  She has noticed some episodes of blood in her sputum.  She was told that she gets wheezing by her dialysis nurse.  She has been using symbicort.  She uses proair several times per week, but is not sure how much this helps.  She ran out of her tessalon.  She denies sinus congestion, post-nasal drip, epistaxis, fever, nausea, vomiting, abdominal pain, or reflux.   Past Medical History  Diagnosis Date  . HTN (hypertension)   . ESRD on hemodialysis   . Mild aortic stenosis   . Dyslipidemia   . TIA (transient ischemic attack)   . Gout   . Benign bladder mass   . DJD (degenerative joint disease)   . Anemia   . Obstructive sleep apnea     mild  . Chronic cough     Past Surgical History  Procedure Date  . Nephrectomy     left  . Total abdominal hysterectomy   . Bone marrow biopsy   . Left arm dialysis graft     Allergies  Allergen Reactions  . Sulfa Antibiotics   . Sulfonamide Derivatives     Physical Exam:  Blood pressure 130/70, pulse 70, temperature 98 F (36.7 C), temperature source Oral, height 5\' 4"  (1.626 m), weight 157 lb 12.8 oz (71.578 kg), SpO2 96.00%.  Body mass index is 27.09 kg/(m^2). Wt Readings from Last 2 Encounters:  01/20/12 157 lb 12.8 oz (71.578 kg)  07/20/11 156 lb 6.4 oz (70.943 kg)    General - no distress  HEENT - no sinus tenderness, no oral exudate, no LAN Cardiac - s1s2 regular Chest - no wheeze/rales Abdomen - soft, nontender Extremities - no edema Skin - no rashes Neurologic -  normal strength Psychiatric - normal mood, behavior   Dg Chest 2 View  01/20/2012  *RADIOLOGY REPORT*  Clinical Data: Chronic cough, hemoptysis  CHEST - 2 VIEW  Comparison: 02/18/2011; 03/05/2010; chest CT - 10/06/2010  Findings: Grossly unchanged enlarged cardiac silhouette and mediastinal contours with mild tortuosity of the descending thoracic aorta.  The lungs remain mildly hyperexpanded.  There is persistent mild elevation of the right hemidiaphragm.  Grossly unchanged bibasilar linear heterogeneous opacities favored to represent atelectasis or scar.  No new focal airspace opacity.  No pleural effusion or pneumothorax.  Unchanged bones including multilevel thoracic spine degenerative change with slightly accentuated thoracic kyphosis.  IMPRESSION: Unchanged mildly hyperexpanded lungs without acute cardiopulmonary disease.   Original Report Authenticated By: Rachel Moulds, M.D.     Exhaled nitric oxide 01/20/12>>18 ppb  Spirometry 01/20/12>>1.23 (83%), FEV1% 73, poor test effort  Assessment/Plan:  Outpatient Encounter Prescriptions as of 01/20/2012  Medication Sig Dispense Refill  . albuterol (PROAIR HFA) 108 (90 BASE) MCG/ACT inhaler 1-2 puffs every 4-6 hours as needed       . aspirin 81 MG tablet Take 81 mg by mouth daily.        Marland Kitchen atorvastatin (LIPITOR) 10 MG tablet Take 10 mg by mouth at bedtime.        Marland Kitchen  budesonide-formoterol (SYMBICORT) 160-4.5 MCG/ACT inhaler Inhale 2 puffs into the lungs 2 (two) times daily.      . calcium acetate (PHOSLO) 667 MG capsule 4 capsules daily      . Multiple Vitamin (MULTIVITAMIN) tablet Take 1 tablet by mouth daily.        Marland Kitchen azithromycin (ZITHROMAX Z-PAK) 250 MG tablet Take 2 tablets (500 mg) on  Day 1,  followed by 1 tablet (250 mg) once daily on Days 2 through 5.  6 each  0  . benzonatate (TESSALON) 100 MG capsule One twice per day as needed for cough  30 capsule  2  . predniSONE (DELTASONE) 5 MG tablet 3 pills for 2 days, then 2 pills for 2 days,  then 1 pill for 2 days  12 tablet  0  . DISCONTD: benzonatate (TESSALON) 100 MG capsule One twice per day as needed for cough  30 capsule  2    Shadow Stiggers Pager:  [phone] 01/20/2012, 10:36 AM

## 2012-01-20 NOTE — Patient Instructions (Signed)
Prednisone 5 mg pill>>3 pills for 2 days, then 2 pills for 2 days, then 1 pill for 2 days Zithromax 250 mg pill>>2 pills on day one, then one pill daily for next four days Call if no better in one week Follow up in 6 months

## 2012-01-20 NOTE — Assessment & Plan Note (Signed)
Will give her course of prednisone and zithromax.  She is to continue symbicort and albuterol.  Refilled her tessalon.  Advised her to call if no better in one week.

## 2012-01-30 ENCOUNTER — Encounter: Payer: Self-pay | Admitting: Pulmonary Disease

## 2012-02-08 ENCOUNTER — Other Ambulatory Visit (HOSPITAL_COMMUNITY): Payer: Self-pay | Admitting: Nephrology

## 2012-02-08 DIAGNOSIS — N186 End stage renal disease: Secondary | ICD-10-CM

## 2012-02-13 ENCOUNTER — Ambulatory Visit (HOSPITAL_COMMUNITY)
Admission: RE | Admit: 2012-02-13 | Discharge: 2012-02-13 | Disposition: A | Discharge status: Home / Self Care | Payer: Medicare Other | Source: Ambulatory Visit | Attending: Nephrology | Admitting: Nephrology

## 2012-02-13 ENCOUNTER — Other Ambulatory Visit (HOSPITAL_COMMUNITY): Payer: Self-pay | Admitting: Nephrology

## 2012-02-13 ENCOUNTER — Ambulatory Visit (HOSPITAL_COMMUNITY): Payer: Medicare Other

## 2012-02-13 DIAGNOSIS — N186 End stage renal disease: Secondary | ICD-10-CM

## 2012-02-13 DIAGNOSIS — I871 Compression of vein: Secondary | ICD-10-CM | POA: Insufficient documentation

## 2012-02-13 DIAGNOSIS — Z992 Dependence on renal dialysis: Secondary | ICD-10-CM | POA: Insufficient documentation

## 2012-02-13 DIAGNOSIS — I120 Hypertensive chronic kidney disease with stage 5 chronic kidney disease or end stage renal disease: Secondary | ICD-10-CM | POA: Insufficient documentation

## 2012-02-13 DIAGNOSIS — Y832 Surgical operation with anastomosis, bypass or graft as the cause of abnormal reaction of the patient, or of later complication, without mention of misadventure at the time of the procedure: Secondary | ICD-10-CM | POA: Insufficient documentation

## 2012-02-13 DIAGNOSIS — T82898A Other specified complication of vascular prosthetic devices, implants and grafts, initial encounter: Secondary | ICD-10-CM | POA: Insufficient documentation

## 2012-02-13 MED ORDER — IOHEXOL 300 MG/ML  SOLN
100.0000 mL | Freq: Once | INTRAMUSCULAR | Status: AC | PRN
  Administered 2012-02-13: 60 mL via INTRAVENOUS

## 2012-02-13 NOTE — H&P (Signed)
[name] is an 78 y.o. female.   Chief Complaint: decreased flows in right arm dialysis graft HPI: Pt with ESRD and recurrent outflow brachial vein stenosis presents today for repeat angioplasty/possible stenting.  Past Medical History  Diagnosis Date  . HTN (hypertension)   . ESRD on hemodialysis   . Mild aortic stenosis   . Dyslipidemia   . TIA (transient ischemic attack)   . Gout   . Benign bladder mass   . DJD (degenerative joint disease)   . Anemia   . Obstructive sleep apnea     mild  . Chronic cough     Past Surgical History  Procedure Date  . Nephrectomy     left  . Total abdominal hysterectomy   . Bone marrow biopsy   . Left arm dialysis graft     Family History  Problem Relation Age of Onset  . Hypertension Mother   . Diabetes Mother    Social History:  reports that she has never smoked. She has never used smokeless tobacco. Her alcohol and drug histories not on file.  Allergies:  Allergies  Allergen Reactions  . Sulfa Antibiotics   . Sulfonamide Derivatives     Current outpatient prescriptions:albuterol (PROAIR HFA) 108 (90 BASE) MCG/ACT inhaler, 1-2 puffs every 4-6 hours as needed , Disp: , Rfl: ;  aspirin 81 MG tablet, Take 81 mg by mouth daily.  , Disp: , Rfl: ;  atorvastatin (LIPITOR) 10 MG tablet, Take 10 mg by mouth at bedtime.  , Disp: , Rfl: ;  benzonatate (TESSALON) 100 MG capsule, One twice per day as needed for cough, Disp: 30 capsule, Rfl: 2 budesonide-formoterol (SYMBICORT) 160-4.5 MCG/ACT inhaler, Inhale 2 puffs into the lungs 2 (two) times daily., Disp: , Rfl: ;  calcium acetate (PHOSLO) 667 MG capsule, 4 capsules daily, Disp: , Rfl: ;  Multiple Vitamin (MULTIVITAMIN) tablet, Take 1 tablet by mouth daily.  , Disp: , Rfl: ;  predniSONE (DELTASONE) 5 MG tablet, 3 pills for 2 days, then 2 pills for 2 days, then 1 pill for 2 days, Disp: 12 tablet, Rfl: 0 DISCONTD: chlorpheniramine (CHLOR-TRIMETON) 4 MG tablet, One pill at night as needed for  cough, Disp: 30 tablet, Rfl: 0  No results found for this or any previous visit (from the past 48 hour(s)). No results found.  Review of Systems  Constitutional: Negative for fever and chills.  Respiratory: Positive for cough. Negative for shortness of breath.   Cardiovascular: Negative for chest pain.  Gastrointestinal: Negative for nausea, vomiting and abdominal pain.  Musculoskeletal: Negative for back pain.  Neurological: Negative for headaches.  Endo/Heme/Allergies: Does not bruise/bleed easily.  There were no vitals filed for this visit.   Physical Exam  Constitutional: She is oriented to person, place, and time. She appears well-developed and well-nourished.  Cardiovascular: Normal rate and regular rhythm.   Respiratory: Effort normal.       Sl dim BS rt base  GI: Soft. Bowel sounds are normal. There is no tenderness.  Musculoskeletal: Normal range of motion. She exhibits edema.  Neurological: She is alert and oriented to person, place, and time.     Assessment/Plan Pt with ESRD , right arm AVGG and  recurrent outflow brachial venous stenosis; plan is for angioplasty/possible stenting of stenosis. Details/risks of above d/w pt with her understanding and consent.  ALLRED,D KEVIN 02/13/2012, 1:25 PM

## 2012-02-13 NOTE — H&P (Signed)
Agree 

## 2012-02-14 ENCOUNTER — Telehealth (HOSPITAL_COMMUNITY): Payer: Self-pay | Admitting: *Deleted

## 2012-02-14 NOTE — Telephone Encounter (Signed)
Post procedure follow up call.  Pt not available, in dialysis this am.  Spoke with pt's daughter in law [place].  Says pt complained of quite a bit of arm pain.  Pt took tylenol with relief.  She will have pt return call with any additional concerns or questions

## 2012-03-19 ENCOUNTER — Other Ambulatory Visit: Payer: Self-pay | Admitting: Internal Medicine

## 2012-03-19 DIAGNOSIS — N19 Unspecified kidney failure: Secondary | ICD-10-CM

## 2012-03-26 ENCOUNTER — Ambulatory Visit
Admission: RE | Admit: 2012-03-26 | Discharge: 2012-03-26 | Disposition: A | Discharge status: Home / Self Care | Payer: Medicare Other | Source: Ambulatory Visit | Attending: Internal Medicine | Admitting: Internal Medicine

## 2012-03-26 ENCOUNTER — Other Ambulatory Visit: Payer: Self-pay | Admitting: Internal Medicine

## 2012-03-26 DIAGNOSIS — N19 Unspecified kidney failure: Secondary | ICD-10-CM

## 2012-03-26 MED ORDER — IOHEXOL 300 MG/ML  SOLN
100.0000 mL | Freq: Once | INTRAMUSCULAR | Status: AC | PRN
  Administered 2012-03-26: 100 mL via INTRAVENOUS

## 2012-08-01 ENCOUNTER — Telehealth: Payer: Self-pay | Admitting: Pulmonary Disease

## 2012-08-01 NOTE — Telephone Encounter (Signed)
Error.  Sent to Med records.  Satira Anis

## 2012-10-02 ENCOUNTER — Telehealth: Payer: Self-pay | Admitting: Pulmonary Disease

## 2012-10-02 NOTE — Telephone Encounter (Signed)
Spoke to pt. She is having a terrible cough and wants to be seen. Normally sees VS for any problem she has. Next available with him is not until 11/07/12. Pt doesn't want to wait this long. Offered her an appointment with TP and she accepted. Her appointment has been moved to 10/08/12 at 9:15am. Nothing further was needed.

## 2012-10-03 ENCOUNTER — Ambulatory Visit: Payer: Medicare Other | Admitting: Internal Medicine

## 2012-10-08 ENCOUNTER — Encounter: Payer: Self-pay | Admitting: Adult Health

## 2012-10-08 ENCOUNTER — Ambulatory Visit (INDEPENDENT_AMBULATORY_CARE_PROVIDER_SITE_OTHER): Payer: Medicare Other | Admitting: Adult Health

## 2012-10-08 VITALS — BP 150/70 | HR 76 | Temp 97.7°F | Ht 63.0 in | Wt 155.4 lb

## 2012-10-08 DIAGNOSIS — R059 Cough, unspecified: Secondary | ICD-10-CM

## 2012-10-08 DIAGNOSIS — R05 Cough: Secondary | ICD-10-CM

## 2012-10-08 MED ORDER — PREDNISONE 10 MG PO TABS: ORAL_TABLET | ORAL | Status: DC

## 2012-10-08 NOTE — Progress Notes (Signed)
  Subjective:    Patient ID: [name], female    DOB: 12-07-1921, 79 y.o.   MRN: [id number]  HPI 79 yo female with known hx of ESRD, chronic cough, asthma  And HTN   10/08/2012 Acute OV  Complains of increased prod cough with clear mucus, SOB w/ coughing for 2 weeks.  denies wheezing, chest tightness, f/c/s.   Seen by Allergist last month ([place]) . Informs that her skin testing was neg. Started Allegra daily . She is not taking this now. Restarted on Omeprazole - ran out 1 week ago.  Has tickle in throat , nasal drip.  No discolored mucus, fever, chest pain , dyspnea, wt loss, edema or orthopnea.  Has constant sensation in throat that something is stuck.      Review of Systems Constitutional:   No  weight loss, night sweats,  Fevers, chills, fatigue, or  lassitude.  HEENT:   No headaches,  Difficulty swallowing,  Tooth/dental problems, or  Sore throat,                No sneezing, itching, ear ache,  +nasal congestion, post nasal drip,   CV:  No chest pain,  Orthopnea, PND, swelling in lower extremities, anasarca, dizziness, palpitations, syncope.   GI  No heartburn, indigestion, abdominal pain, nausea, vomiting, diarrhea, change in bowel habits, loss of appetite, bloody stools.   Resp: No shortness of breath with exertion or at rest.  No excess mucus, no productive cough,     No coughing up of blood.  No change in color of mucus.  No wheezing.  No chest wall deformity  Skin: no rash or lesions.  GU: no dysuria, change in color of urine, no urgency or frequency.  No flank pain, no hematuria   MS:  No joint pain or swelling.  No decreased range of motion.  No back pain.  Psych:  No change in mood or affect. No depression or anxiety.  No memory loss.         Objective:   Physical Exam GEN: A/Ox3; pleasant , NAD, elderly   HEENT:  [place]/AT,  EACs-clear, TMs-wnl, NOSE-clear drainage  THROAT-clear, no lesions, no postnasal drip or exudate noted.   NECK:  Supple w/ fair  ROM; no JVD; normal carotid impulses w/o bruits; no thyromegaly or nodules palpated; no lymphadenopathy.  RESP  Few exp wheezes no accessory muscle use, no dullness to percussion  CARD:  RRR, no m/r/g  , no peripheral edema, pulses intact, no cyanosis or clubbing.  GI:   Soft & nt; nml bowel sounds; no organomegaly or masses detected.  Musco: Warm bil, no deformities or joint swelling noted.   Neuro: alert, no focal deficits noted.    Skin: Warm, no lesions or rashes         Assessment & Plan:

## 2012-10-08 NOTE — Progress Notes (Signed)
Reviewed and agree with assessment/plan. 

## 2012-10-08 NOTE — Patient Instructions (Addendum)
Prednisone taper over next week.  Delsym cough syrup 2 tsp Twice daily  As needed  Cough.  Restart Allegra 180mg  daily  Begin Chlortrimeton 4mg  At bedtime   Restart Prilosec (Omeprazole) 20mg  daily  follow up Dr. Halford Chessman  In 4 weeks and As needed   Please contact office for sooner follow up if symptoms do not improve or worsen or seek emergency care

## 2012-10-08 NOTE — Assessment & Plan Note (Addendum)
Flare with rhinitis +/- silent GERD  Consider ENT referral if not improving on return   Plan  Prednisone taper over next week.  Delsym cough syrup 2 tsp Twice daily  As needed  Cough.  Restart Allegra 180mg  daily  Begin Chlortrimeton 4mg  At bedtime   Restart Prilosec (Omeprazole) 20mg  daily  follow up Dr. Halford Chessman  In 4 weeks and As needed   Please contact office for sooner follow up if symptoms do not improve or worsen or seek emergency care

## 2012-11-07 ENCOUNTER — Ambulatory Visit: Payer: Medicare Other | Admitting: Pulmonary Disease

## 2012-11-12 ENCOUNTER — Ambulatory Visit (INDEPENDENT_AMBULATORY_CARE_PROVIDER_SITE_OTHER): Payer: Medicare Other | Admitting: Pulmonary Disease

## 2012-11-12 ENCOUNTER — Encounter: Payer: Self-pay | Admitting: Pulmonary Disease

## 2012-11-12 VITALS — BP 124/64 | HR 82 | Temp 98.1°F | Ht 65.0 in | Wt 154.0 lb

## 2012-11-12 DIAGNOSIS — R05 Cough: Secondary | ICD-10-CM

## 2012-11-12 DIAGNOSIS — J310 Chronic rhinitis: Secondary | ICD-10-CM

## 2012-11-12 MED ORDER — FLUTICASONE PROPIONATE 50 MCG/ACT NA SUSP: 2.0000 | Freq: Every day | NASAL | Status: DC

## 2012-11-12 NOTE — Assessment & Plan Note (Signed)
If not better at next visit, then consider ENT evaluation.

## 2012-11-12 NOTE — Progress Notes (Signed)
Chief Complaint  Patient presents with  . Follow-up    Cough has improved since last OV. States that the medication TP gave her helped. Feels as though the cough may be coming back.    [id number] [place]  History of Present Illness: [name] is a 79 y.o. female never smoker with chronic cough.  Her cough was much better after recent visit with Tammy Parrett.  She felt prednisone helped the most.  She is now getting nasal congestion, post-nasal drip, and cough >> not as bad as before.  She is still using delsym, allergra, and chlortrimeton.  She does not feel chest congestion.  She does not think prilosec has helped.  TESTS: PFT 03/26/10 >> FEV1 1.28(65%), FEV1% 71, positive bronchodilator response, TLC 3.79 (69%), DLCO 63%. RAST 07/22/10 >>  negative, IgE 13.9 Exhaled nitric oxide 01/20/12 >> 18 ppb Spirometry 01/20/12 >> 1.23 (83%), FEV1% 73, poor test effort CXR 01/20/12 >> Unchanged mildly hyperexpanded lungs without acute cardiopulmonary disease.   She  has a past medical history of HTN (hypertension); ESRD on hemodialysis; Mild aortic stenosis; Dyslipidemia; TIA (transient ischemic attack); Gout; Benign bladder mass; DJD (degenerative joint disease); Anemia; Obstructive sleep apnea; and Chronic cough.  She  has past surgical history that includes Nephrectomy; Total abdominal hysterectomy; Bone marrow biopsy; and left arm dialysis graft.  Current Outpatient Prescriptions on File Prior to Visit  Medication Sig Dispense Refill  . albuterol (PROAIR HFA) 108 (90 BASE) MCG/ACT inhaler 1-2 puffs every 4-6 hours as needed       . amLODipine (NORVASC) 10 MG tablet Take 10 mg by mouth daily.      Marland Kitchen aspirin 81 MG tablet Take 81 mg by mouth daily.        Marland Kitchen atorvastatin (LIPITOR) 10 MG tablet Take 10 mg by mouth at bedtime.        . calcium acetate (PHOSLO) 667 MG capsule 4 capsules daily      . fexofenadine (ALLEGRA) 180 MG tablet Take 180 mg by mouth daily as needed.      .  Multiple Vitamin (MULTIVITAMIN) tablet Take 1 tablet by mouth daily.        Marland Kitchen omeprazole (PRILOSEC) 40 MG capsule Take 1 capsule by mouth daily.      . budesonide-formoterol (SYMBICORT) 160-4.5 MCG/ACT inhaler Inhale 2 puffs into the lungs 2 (two) times daily.      . [DISCONTINUED] chlorpheniramine (CHLOR-TRIMETON) 4 MG tablet One pill at night as needed for cough  30 tablet  0   No current facility-administered medications on file prior to visit.    Allergies  Allergen Reactions  . Sulfa Antibiotics   . Sulfonamide Derivatives     Physical Exam:  General - no distress  HEENT - no sinus tenderness, no oral exudate, no LAN Cardiac - s1s2 regular Chest - no wheeze/rales Abdomen - soft, nontender Extremities - no edema Skin - no rashes Neurologic - normal strength Psychiatric - normal mood, behavior   Assessment/Plan:  Chesley Mires, MD [place] 11/12/2012, 2:07 PM Pager:  [phone] After 3pm call: [phone]

## 2012-11-12 NOTE — Patient Instructions (Addendum)
Stop prilosec Flonase two sprays each nostril daily Delsym cough syrup 2 tsp Twice daily As needed Cough.  Allegra 180mg  daily  Chlortrimeton 4mg  At bedtime Follow up in 2 months

## 2012-11-12 NOTE — Assessment & Plan Note (Signed)
Likely related to post-nasal drip and asthma.  Will try adding flonase.  Continue allegra, prn delsym, allegra, and chlortrimeton.  She is to continue symbicort and prn albuterol for now.  Reflux not a prominent issue >> will stop prilosec.

## 2012-11-20 ENCOUNTER — Encounter (HOSPITAL_COMMUNITY): Payer: Self-pay | Admitting: Internal Medicine

## 2012-11-20 ENCOUNTER — Inpatient Hospital Stay (HOSPITAL_COMMUNITY)
Admission: EM | Admit: 2012-11-20 | Discharge: 2012-11-24 | DRG: 871 | Disposition: A | Discharge status: Home / Self Care | Payer: Medicare Other | Attending: Internal Medicine | Admitting: Internal Medicine

## 2012-11-20 ENCOUNTER — Emergency Department (HOSPITAL_COMMUNITY): Payer: Medicare Other

## 2012-11-20 DIAGNOSIS — D649 Anemia, unspecified: Secondary | ICD-10-CM

## 2012-11-20 DIAGNOSIS — A419 Sepsis, unspecified organism: Secondary | ICD-10-CM | POA: Diagnosis present

## 2012-11-20 DIAGNOSIS — I120 Hypertensive chronic kidney disease with stage 5 chronic kidney disease or end stage renal disease: Secondary | ICD-10-CM | POA: Diagnosis present

## 2012-11-20 DIAGNOSIS — N039 Chronic nephritic syndrome with unspecified morphologic changes: Secondary | ICD-10-CM | POA: Diagnosis present

## 2012-11-20 DIAGNOSIS — J9600 Acute respiratory failure, unspecified whether with hypoxia or hypercapnia: Secondary | ICD-10-CM

## 2012-11-20 DIAGNOSIS — J189 Pneumonia, unspecified organism: Secondary | ICD-10-CM

## 2012-11-20 DIAGNOSIS — N2581 Secondary hyperparathyroidism of renal origin: Secondary | ICD-10-CM

## 2012-11-20 DIAGNOSIS — R059 Cough, unspecified: Secondary | ICD-10-CM

## 2012-11-20 DIAGNOSIS — E785 Hyperlipidemia, unspecified: Secondary | ICD-10-CM | POA: Diagnosis present

## 2012-11-20 DIAGNOSIS — D696 Thrombocytopenia, unspecified: Secondary | ICD-10-CM

## 2012-11-20 DIAGNOSIS — A413 Sepsis due to Hemophilus influenzae: Principal | ICD-10-CM | POA: Diagnosis present

## 2012-11-20 DIAGNOSIS — I4510 Unspecified right bundle-branch block: Secondary | ICD-10-CM | POA: Diagnosis present

## 2012-11-20 DIAGNOSIS — D638 Anemia in other chronic diseases classified elsewhere: Secondary | ICD-10-CM | POA: Diagnosis present

## 2012-11-20 DIAGNOSIS — N189 Chronic kidney disease, unspecified: Secondary | ICD-10-CM | POA: Diagnosis present

## 2012-11-20 DIAGNOSIS — D631 Anemia in chronic kidney disease: Secondary | ICD-10-CM

## 2012-11-20 DIAGNOSIS — Z8673 Personal history of transient ischemic attack (TIA), and cerebral infarction without residual deficits: Secondary | ICD-10-CM

## 2012-11-20 DIAGNOSIS — M899 Disorder of bone, unspecified: Secondary | ICD-10-CM | POA: Diagnosis present

## 2012-11-20 DIAGNOSIS — R509 Fever, unspecified: Secondary | ICD-10-CM | POA: Diagnosis present

## 2012-11-20 DIAGNOSIS — J45909 Unspecified asthma, uncomplicated: Secondary | ICD-10-CM | POA: Diagnosis present

## 2012-11-20 DIAGNOSIS — M949 Disorder of cartilage, unspecified: Secondary | ICD-10-CM | POA: Diagnosis present

## 2012-11-20 DIAGNOSIS — N186 End stage renal disease: Secondary | ICD-10-CM

## 2012-11-20 DIAGNOSIS — R7881 Bacteremia: Secondary | ICD-10-CM

## 2012-11-20 DIAGNOSIS — M109 Gout, unspecified: Secondary | ICD-10-CM | POA: Diagnosis present

## 2012-11-20 DIAGNOSIS — Z992 Dependence on renal dialysis: Secondary | ICD-10-CM | POA: Diagnosis present

## 2012-11-20 DIAGNOSIS — R05 Cough: Secondary | ICD-10-CM

## 2012-11-20 DIAGNOSIS — G4733 Obstructive sleep apnea (adult) (pediatric): Secondary | ICD-10-CM | POA: Diagnosis present

## 2012-11-20 DIAGNOSIS — I359 Nonrheumatic aortic valve disorder, unspecified: Secondary | ICD-10-CM | POA: Diagnosis present

## 2012-11-20 DIAGNOSIS — G459 Transient cerebral ischemic attack, unspecified: Secondary | ICD-10-CM | POA: Diagnosis present

## 2012-11-20 LAB — CBC WITH DIFFERENTIAL/PLATELET
Basophils Absolute: 0 10*3/uL (ref 0.0–0.1)
Basophils Relative: 0 % (ref 0–1)
Eosinophils Absolute: 0 10*3/uL (ref 0.0–0.7)
Eosinophils Relative: 0 % (ref 0–5)
HCT: 29.4 % — ABNORMAL LOW (ref 36.0–46.0)
Lymphocytes Relative: 11 % — ABNORMAL LOW (ref 12–46)
MCH: 32.7 pg (ref 26.0–34.0)
MCHC: 33.3 g/dL (ref 30.0–36.0)
MCV: 98 fL (ref 78.0–100.0)
Monocytes Absolute: 0.6 10*3/uL (ref 0.1–1.0)
RDW: 13.4 % (ref 11.5–15.5)

## 2012-11-20 LAB — CBC
HCT: 27.2 % — ABNORMAL LOW (ref 36.0–46.0)
MCH: 32.5 pg (ref 26.0–34.0)
MCHC: 33.5 g/dL (ref 30.0–36.0)
MCV: 97.1 fL (ref 78.0–100.0)
RDW: 13.5 % (ref 11.5–15.5)
WBC: 9.5 10*3/uL (ref 4.0–10.5)

## 2012-11-20 LAB — BASIC METABOLIC PANEL
BUN: 50 mg/dL — ABNORMAL HIGH (ref 6–23)
CO2: 28 mEq/L (ref 19–32)
Calcium: 9.3 mg/dL (ref 8.4–10.5)
Chloride: 92 mEq/L — ABNORMAL LOW (ref 96–112)
Creatinine, Ser: 9.61 mg/dL — ABNORMAL HIGH (ref 0.50–1.10)
Glucose, Bld: 133 mg/dL — ABNORMAL HIGH (ref 70–99)

## 2012-11-20 LAB — COMPREHENSIVE METABOLIC PANEL
AST: 16 U/L (ref 0–37)
CO2: 30 mEq/L (ref 19–32)
Calcium: 9.4 mg/dL (ref 8.4–10.5)
Creatinine, Ser: 8.84 mg/dL — ABNORMAL HIGH (ref 0.50–1.10)
GFR calc non Af Amer: 3 mL/min — ABNORMAL LOW (ref >90)
Total Protein: 6.5 g/dL (ref 6.0–8.3)

## 2012-11-20 MED ORDER — DOXERCALCIFEROL 4 MCG/2ML IV SOLN
1.0000 ug | INTRAVENOUS | Status: DC
  Administered 2012-11-20 – 2012-11-24 (×3): 1 ug via INTRAVENOUS
  Filled 2012-11-20 (×3): qty 2

## 2012-11-20 MED ORDER — BUDESONIDE-FORMOTEROL FUMARATE 160-4.5 MCG/ACT IN AERO
2.0000 | INHALATION_SPRAY | Freq: Two times a day (BID) | RESPIRATORY_TRACT | Status: DC
  Administered 2012-11-20 – 2012-11-23 (×8): 2 via RESPIRATORY_TRACT
  Filled 2012-11-20: qty 6

## 2012-11-20 MED ORDER — IPRATROPIUM BROMIDE 0.02 % IN SOLN
0.5000 mg | RESPIRATORY_TRACT | Status: DC
  Administered 2012-11-20 (×2): 0.5 mg via RESPIRATORY_TRACT
  Filled 2012-11-20 (×2): qty 2.5

## 2012-11-20 MED ORDER — ALBUTEROL SULFATE (5 MG/ML) 0.5% IN NEBU: 2.5000 mg | INHALATION_SOLUTION | RESPIRATORY_TRACT | Status: AC | PRN

## 2012-11-20 MED ORDER — LORATADINE 10 MG PO TABS
10.0000 mg | ORAL_TABLET | Freq: Every day | ORAL | Status: DC
  Administered 2012-11-21 – 2012-11-23 (×3): 10 mg via ORAL
  Filled 2012-11-20 (×4): qty 1

## 2012-11-20 MED ORDER — DEXTROSE 5 % IV SOLN: 1.0000 g | Freq: Three times a day (TID) | INTRAVENOUS | Status: DC

## 2012-11-20 MED ORDER — DEXTROSE 5 % IV SOLN
1.0000 g | INTRAVENOUS | Status: DC
  Filled 2012-11-20: qty 1

## 2012-11-20 MED ORDER — CINACALCET HCL 30 MG PO TABS
30.0000 mg | ORAL_TABLET | Freq: Every day | ORAL | Status: DC
  Administered 2012-11-20 – 2012-11-23 (×4): 30 mg via ORAL
  Filled 2012-11-20 (×6): qty 1

## 2012-11-20 MED ORDER — VANCOMYCIN HCL IN DEXTROSE 750-5 MG/150ML-% IV SOLN
750.0000 mg | INTRAVENOUS | Status: DC
  Administered 2012-11-22 – 2012-11-24 (×2): 750 mg via INTRAVENOUS
  Filled 2012-11-20 (×4): qty 150

## 2012-11-20 MED ORDER — IPRATROPIUM BROMIDE 0.02 % IN SOLN
0.5000 mg | Freq: Once | RESPIRATORY_TRACT | Status: AC
  Administered 2012-11-20: 0.5 mg via RESPIRATORY_TRACT
  Filled 2012-11-20: qty 2.5

## 2012-11-20 MED ORDER — DARBEPOETIN ALFA-POLYSORBATE 40 MCG/0.4ML IJ SOLN
40.0000 ug | INTRAMUSCULAR | Status: DC
  Administered 2012-11-20: 40 ug via INTRAVENOUS
  Filled 2012-11-20: qty 0.4

## 2012-11-20 MED ORDER — ACETAMINOPHEN 325 MG PO TABS
650.0000 mg | ORAL_TABLET | Freq: Once | ORAL | Status: AC
  Administered 2012-11-20: 650 mg via ORAL
  Filled 2012-11-20: qty 2

## 2012-11-20 MED ORDER — VANCOMYCIN HCL IN DEXTROSE 750-5 MG/150ML-% IV SOLN
750.0000 mg | INTRAVENOUS | Status: DC
  Filled 2012-11-20: qty 150

## 2012-11-20 MED ORDER — ADULT MULTIVITAMIN W/MINERALS CH: 1.0000 | ORAL_TABLET | Freq: Every day | ORAL | Status: DC

## 2012-11-20 MED ORDER — DEXTROSE 5 % IV SOLN
2.0000 g | INTRAVENOUS | Status: DC | PRN
  Filled 2012-11-20: qty 2

## 2012-11-20 MED ORDER — ASPIRIN 81 MG PO CHEW
81.0000 mg | CHEWABLE_TABLET | Freq: Every day | ORAL | Status: DC
  Administered 2012-11-21 – 2012-11-23 (×3): 81 mg via ORAL
  Filled 2012-11-20 (×4): qty 1

## 2012-11-20 MED ORDER — ATORVASTATIN CALCIUM 10 MG PO TABS
10.0000 mg | ORAL_TABLET | Freq: Every day | ORAL | Status: DC
  Administered 2012-11-20 – 2012-11-23 (×4): 10 mg via ORAL
  Filled 2012-11-20 (×6): qty 1

## 2012-11-20 MED ORDER — ONDANSETRON HCL 4 MG/2ML IJ SOLN
4.0000 mg | Freq: Four times a day (QID) | INTRAMUSCULAR | Status: DC | PRN
  Administered 2012-11-20: 4 mg via INTRAVENOUS
  Filled 2012-11-20: qty 2

## 2012-11-20 MED ORDER — CALCIUM ACETATE 667 MG PO CAPS: 667.0000 mg | ORAL_CAPSULE | Freq: Two times a day (BID) | ORAL | Status: DC

## 2012-11-20 MED ORDER — SODIUM CHLORIDE 0.9 % IV SOLN
INTRAVENOUS | Status: AC
  Administered 2012-11-20: 17:00:00 via INTRAVENOUS

## 2012-11-20 MED ORDER — VANCOMYCIN HCL 10 G IV SOLR
1500.0000 mg | INTRAVENOUS | Status: DC
  Filled 2012-11-20: qty 1500

## 2012-11-20 MED ORDER — DEXTROSE 5 % IV SOLN
1.0000 g | INTRAVENOUS | Status: DC
  Administered 2012-11-20: 1 g via INTRAVENOUS
  Filled 2012-11-20: qty 10

## 2012-11-20 MED ORDER — ALBUTEROL SULFATE (5 MG/ML) 0.5% IN NEBU
2.5000 mg | INHALATION_SOLUTION | Freq: Two times a day (BID) | RESPIRATORY_TRACT | Status: DC
  Administered 2012-11-20 – 2012-11-22 (×4): 2.5 mg via RESPIRATORY_TRACT
  Filled 2012-11-20 (×4): qty 0.5

## 2012-11-20 MED ORDER — ALBUTEROL SULFATE HFA 108 (90 BASE) MCG/ACT IN AERS: 1.0000 | INHALATION_SPRAY | RESPIRATORY_TRACT | Status: DC | PRN

## 2012-11-20 MED ORDER — CALCIUM ACETATE 667 MG PO CAPS
2001.0000 mg | ORAL_CAPSULE | Freq: Three times a day (TID) | ORAL | Status: DC
  Administered 2012-11-20 – 2012-11-23 (×8): 2001 mg via ORAL
  Filled 2012-11-20 (×11): qty 3
  Filled 2012-11-20: qty 1
  Filled 2012-11-20: qty 3

## 2012-11-20 MED ORDER — ALBUTEROL SULFATE (5 MG/ML) 0.5% IN NEBU
2.5000 mg | INHALATION_SOLUTION | Freq: Once | RESPIRATORY_TRACT | Status: AC
  Administered 2012-11-20: 2.5 mg via RESPIRATORY_TRACT
  Filled 2012-11-20: qty 0.5

## 2012-11-20 MED ORDER — ALBUTEROL SULFATE (5 MG/ML) 0.5% IN NEBU
2.5000 mg | INHALATION_SOLUTION | RESPIRATORY_TRACT | Status: DC
  Administered 2012-11-20 (×2): 2.5 mg via RESPIRATORY_TRACT
  Filled 2012-11-20 (×2): qty 0.5

## 2012-11-20 MED ORDER — AZITHROMYCIN 500 MG IV SOLR
500.0000 mg | Freq: Once | INTRAVENOUS | Status: AC
  Administered 2012-11-20: 500 mg via INTRAVENOUS
  Filled 2012-11-20: qty 500

## 2012-11-20 MED ORDER — RENA-VITE PO TABS
1.0000 | ORAL_TABLET | Freq: Every day | ORAL | Status: DC
  Administered 2012-11-21 – 2012-11-23 (×3): 1 via ORAL
  Filled 2012-11-20 (×5): qty 1

## 2012-11-20 MED ORDER — AMLODIPINE BESYLATE 10 MG PO TABS
10.0000 mg | ORAL_TABLET | Freq: Every day | ORAL | Status: DC
  Filled 2012-11-20 (×2): qty 1

## 2012-11-20 MED ORDER — VANCOMYCIN HCL 1000 MG IV SOLR
1500.0000 mg | INTRAVENOUS | Status: AC
  Administered 2012-11-20: 1500 mg via INTRAVENOUS
  Filled 2012-11-20: qty 1500

## 2012-11-20 MED ORDER — DEXTROSE 5 % IV SOLN
2.0000 g | INTRAVENOUS | Status: DC
  Administered 2012-11-20 – 2012-11-22 (×2): 2 g via INTRAVENOUS
  Filled 2012-11-20 (×2): qty 2

## 2012-11-20 MED ORDER — HEPARIN SODIUM (PORCINE) 5000 UNIT/ML IJ SOLN
5000.0000 [IU] | Freq: Three times a day (TID) | INTRAMUSCULAR | Status: DC
Start: 2012-11-20 — End: 2012-11-24
  Administered 2012-11-20 – 2012-11-24 (×12): 5000 [IU] via SUBCUTANEOUS
  Filled 2012-11-20 (×17): qty 1

## 2012-11-20 MED ORDER — IPRATROPIUM BROMIDE 0.02 % IN SOLN
0.5000 mg | Freq: Two times a day (BID) | RESPIRATORY_TRACT | Status: DC
  Administered 2012-11-20 – 2012-11-22 (×4): 0.5 mg via RESPIRATORY_TRACT
  Filled 2012-11-20 (×4): qty 2.5

## 2012-11-20 NOTE — ED Provider Notes (Signed)
History    CSN: [id number] Arrival date & time 11/20/12  0142  First MD Initiated Contact with Patient 11/20/12 0201     Chief Complaint  Patient presents with  . Weakness  . Cough   (Consider location/radiation/quality/duration/timing/severity/associated sxs/prior Treatment) Patient is a 79 y.o. female presenting with weakness and cough. The history is provided by the patient.  Weakness  Cough She started feeling weak yesterday. She's had a cough for the past 4 days productive of clear to yellowish sputum. She denies dyspnea. There is some associated nausea which is worse after coughing paroxysms. She denies vomiting and diarrhea. She has end-stage renal disease and does not urinate. She denies fever chills or sweats. Last dialysis was July 12. Past Medical History  Diagnosis Date  . HTN (hypertension)   . ESRD on hemodialysis   . Mild aortic stenosis   . Dyslipidemia   . TIA (transient ischemic attack)   . Gout   . Benign bladder mass   . DJD (degenerative joint disease)   . Anemia   . Obstructive sleep apnea     mild  . Chronic cough    Past Surgical History  Procedure Laterality Date  . Nephrectomy      left  . Total abdominal hysterectomy    . Bone marrow biopsy    . Left arm dialysis graft     Family History  Problem Relation Age of Onset  . Hypertension Mother   . Diabetes Mother    History  Substance Use Topics  . Smoking status: Never Smoker   . Smokeless tobacco: Never Used  . Alcohol Use: Not on file   OB History   Grav Para Term Preterm Abortions TAB SAB Ect Mult Living                 Review of Systems  Respiratory: Positive for cough.   Neurological: Positive for weakness.  All other systems reviewed and are negative.    Allergies  Sulfa antibiotics and Sulfonamide derivatives  Home Medications   Current Outpatient Rx  Name  Route  Sig  Dispense  Refill  . albuterol (PROAIR HFA) 108 (90 BASE) MCG/ACT inhaler      1-2 puffs every  4-6 hours as needed          . amLODipine (NORVASC) 10 MG tablet   Oral   Take 10 mg by mouth daily.         Marland Kitchen aspirin 81 MG tablet   Oral   Take 81 mg by mouth daily.           Marland Kitchen atorvastatin (LIPITOR) 10 MG tablet   Oral   Take 10 mg by mouth at bedtime.           . budesonide-formoterol (SYMBICORT) 160-4.5 MCG/ACT inhaler   Inhalation   Inhale 2 puffs into the lungs 2 (two) times daily.         . calcium acetate (PHOSLO) 667 MG capsule      4 capsules daily         . fexofenadine (ALLEGRA) 180 MG tablet   Oral   Take 180 mg by mouth daily as needed.         . fluticasone (FLONASE) 50 MCG/ACT nasal spray   Nasal   Place 2 sprays into the nose daily.   16 g   2   . Multiple Vitamin (MULTIVITAMIN) tablet   Oral   Take 1 tablet by mouth daily.  BP 136/56  Pulse 92  Temp(Src) 102.5 F (39.2 C) (Oral)  Resp 18  SpO2 94% Physical Exam  Nursing note and vitals reviewed.  79 year old female, resting comfortably and in no acute distress. Vital signs are significant for fever with temperature 102.5. Oxygen saturation is 94%, which is normal. Head is normocephalic and atraumatic. PERRLA, EOMI. Oropharynx is clear. Neck is nontender and supple without adenopathy or JVD. Back is nontender and there is no CVA tenderness. Lungs have rales at both bases and scattered wheezes throughout. Chest is nontender. Heart has regular rate and rhythm without murmur. Abdomen is soft, flat, nontender without masses or hepatosplenomegaly and peristalsis is normoactive. Extremities have no cyanosis or edema, full range of motion is present. AV fistula is present in the right forearm with strong thrill present. Skin is warm and dry without rash. Neurologic: Mental status is normal, cranial nerves are intact, there are no motor or sensory deficits.  ED Course  Procedures (including critical care time) Results for orders placed during the hospital encounter of  11/20/12  CBC WITH DIFFERENTIAL      Result Value Range   WBC 8.2  4.0 - 10.5 K/uL   RBC 3.00 (*) 3.87 - 5.11 MIL/uL   Hemoglobin 9.8 (*) 12.0 - 15.0 g/dL   HCT 29.4 (*) 36.0 - 46.0 %   MCV 98.0  78.0 - 100.0 fL   MCH 32.7  26.0 - 34.0 pg   MCHC 33.3  30.0 - 36.0 g/dL   RDW 13.4  11.5 - 15.5 %   Platelets 126 (*) 150 - 400 K/uL   Neutrophils Relative % 82 (*) 43 - 77 %   Neutro Abs 6.7  1.7 - 7.7 K/uL   Lymphocytes Relative 11 (*) 12 - 46 %   Lymphs Abs 0.9  0.7 - 4.0 K/uL   Monocytes Relative 7  3 - 12 %   Monocytes Absolute 0.6  0.1 - 1.0 K/uL   Eosinophils Relative 0  0 - 5 %   Eosinophils Absolute 0.0  0.0 - 0.7 K/uL   Basophils Relative 0  0 - 1 %   Basophils Absolute 0.0  0.0 - 0.1 K/uL  COMPREHENSIVE METABOLIC PANEL      Result Value Range   Sodium 138  135 - 145 mEq/L   Potassium 4.7  3.5 - 5.1 mEq/L   Chloride 93 (*) 96 - 112 mEq/L   CO2 30  19 - 32 mEq/L   Glucose, Bld 113 (*) 70 - 99 mg/dL   BUN 47 (*) 6 - 23 mg/dL   Creatinine, Ser 8.84 (*) 0.50 - 1.10 mg/dL   Calcium 9.4  8.4 - 10.5 mg/dL   Total Protein 6.5  6.0 - 8.3 g/dL   Albumin 3.3 (*) 3.5 - 5.2 g/dL   AST 16  0 - 37 U/L   ALT 14  0 - 35 U/L   Alkaline Phosphatase 114  39 - 117 U/L   Total Bilirubin 0.4  0.3 - 1.2 mg/dL   GFR calc non Af Amer 3 (*) >90 mL/min   GFR calc Af Amer 4 (*) >90 mL/min   Dg Chest 2 View  11/20/2012   *RADIOLOGY REPORT*  Clinical Data: Weakness and cough.  CHEST - 2 VIEW  Comparison: Chest radiograph performed 01/20/2012  Findings: The lungs are well-aerated.  There is focal rounded opacity at the left lower lobe, overlying the left hilum on the frontal view.  Though this could reflect  pneumonia, malignancy cannot be excluded.  Would correlate with the patient's symptoms, and consider CT for further evaluation.  There is no evidence of pleural effusion or pneumothorax. Additional atelectasis or scarring is noted at the left lung base.  The heart is normal in size; the mediastinal  contour is within normal limits.  No acute osseous abnormalities are seen.  IMPRESSION: Focal rounded opacity at the left lower lung lobe, overlying the left hilum on the frontal view.  Though this could reflect pneumonia, malignancy cannot be excluded.  Would correlate with the patient's symptoms, and consider CT for further evaluation.  If a CT is not performed, a follow-up chest radiograph would be indicated after treatment for pneumonia.   Original Report Authenticated By: Santa Lighter, M.D.    Images viewed by me.   Date: 11/20/2012  Rate: 89  Rhythm: normal sinus rhythm  QRS Axis: normal  Intervals: normal  ST/T Wave abnormalities: normal  Conduction Disutrbances:right bundle branch block  Narrative Interpretation: Right bundle branch block. No prior ECG available for comparison.  Old EKG Reviewed: none available   1. Community acquired pneumonia   2. End stage renal disease on dialysis   3. Anemia   4. Thrombocytopenia     MDM  Fever and weakness with respiratory infection. Chest x-ray will be obtained to rule out pneumonia. Old records are reviewed and she has been evaluated by pulmonology and apparently has had a chronic cough which has responded well to steroids. She will be given an albuterol with Atrovent nebulizer treatment. Acetaminophen was given for fever.  Chest x-ray shows what appears to be an area of pneumonia which puts her clinical presentation. She is started on antibiotics for community-acquired pneumonia of ceftriaxone and azithromycin. Wheezing is slightly improved after albuterol with ipratropium. Case is discussed with Dr. Reece Levy of [facility] who agrees to admit the patient.   Delora Fuel, MD [id number]

## 2012-11-20 NOTE — ED Notes (Signed)
Pt states she has been feeling weak for 2 days, and did not want to come to the hospital, because she thought she would get better.

## 2012-11-20 NOTE — ED Notes (Signed)
Pink armband placed to right arm, dilaysis Tues Thurs Sat

## 2012-11-20 NOTE — Procedures (Signed)
I was present at this dialysis session. I have reviewed the session itself and made appropriate changes.   Kelly Splinter  MD Pager [phone]    Cell  [phone] 11/20/2012, 3:56 PM

## 2012-11-20 NOTE — Progress Notes (Addendum)
ANTIBIOTIC CONSULT NOTE - INITIAL  Pharmacy Consult for Vancomycin Indication: rule out pneumonia  Allergies  Allergen Reactions  . Sulfa Antibiotics   . Sulfonamide Derivatives     Patient Measurements: Height: 5\' 4"  (162.6 cm) Weight: 154 lb 1.6 oz (69.9 kg) IBW/kg (Calculated) : 54.7  Vital Signs: Temp: 99.2 F (37.3 C) (07/15 0635) Temp src: Oral (07/15 0635) BP: 146/79 mmHg (07/15 0635) Pulse Rate: 108 (07/15 0635) Intake/Output from previous day:   Intake/Output from this shift:    Labs:  Recent Labs  11/20/12 0244  WBC 8.2  HGB 9.8*  PLT 126*  CREATININE 8.84*   Estimated Creatinine Clearance: 4.1 ml/min (by C-G formula based on Cr of 8.84). No results found for this basename: VANCOTROUGH, VANCOPEAK, VANCORANDOM, GENTTROUGH, GENTPEAK, GENTRANDOM, TOBRATROUGH, TOBRAPEAK, TOBRARND, AMIKACINPEAK, AMIKACINTROU, AMIKACIN,  in the last 72 hours   Microbiology: No results found for this or any previous visit (from the past 720 hour(s)).  Medical History: Past Medical History  Diagnosis Date  . HTN (hypertension)   . ESRD on hemodialysis   . Mild aortic stenosis   . Dyslipidemia   . TIA (transient ischemic attack)   . Gout   . Benign bladder mass   . DJD (degenerative joint disease)   . Anemia   . Obstructive sleep apnea     mild  . Chronic cough     Assessment: 79 y/o f with ESRD who presented with weakness, fever (102.5), and cough. Sputum changed from clear to green/yellow. X-ray showed opacity in left lower lobe so patient admitted for management of pneumonia. Pharmacy consulted to start vancomycin along with cefepime per MD for empiric HCAP coverage. Patient receives HD T/Th/Sat Last HD on 7/12 -- due for HD today, renal yet to see today.  Patient received azithromycin and ceftriaxone in the ED. Cefepime was ordered but patient never received.      Goal of Therapy:  Pre-HD Vancomycin level of 15-25 mcg/ml  Plan:  1. Vancomycin 1500mg  IV x 1 to  load 2. Vancomycin 750mg  IV post HD - T/Th/Sat 3. Cefepime 1g IV x 1 now  4. Cefepime 2g IV post HD - T/Th/Sat 5. Monitor during HD sessions. Check vanc trough level prior to HD when appropriate. Monitor temp   [name] 11/20/2012,9:26 AM  --------------------------------------------------------------------------------------------- Addendum:   This patient went to hemodialysis prior to receiving the Vancomycin LD and Cefepime 1g dose that were scheduled to be given pre-HD. Will go ahead and d/c these orders and adjust the antibiotic doses accordingly to start after HD today.  Plan:  1. Vancomycin 1500 mg IV x 1 post HD today 2. Vancomycin 750 mg post HD on T/Th/Sat (next dose due on 7/17) 3. D/c Cefepime 1g IV x 1 and adjust to Cefepime 2g IV post HD today  4. Cefepime 2g IV post HD on T/Th/Sat (next dose due on 7/17) 5. Will continue to follow HD schedule/duration, culture results, LOT, and antibiotic de-escalation plans   Alycia Rossetti, PharmD, BCPS Clinical Pharmacist Pager: [phone] 11/20/2012 12:56 PM

## 2012-11-20 NOTE — ED Notes (Signed)
EMS-pt reports productive cough x 4 days, green sputum, with with generalize weakness and poor PO intake

## 2012-11-20 NOTE — Progress Notes (Signed)
PT Cancellation Note  Patient Details Name: BENTLIE DORAZIO MRN: [id number] DOB: November 16, 1921   Cancelled Treatment:    Reason Eval/Treat Not Completed: Medical issues which prohibited therapy .  Pt in HD Will see as able 7/16. 11/20/2012  Donnella Sham, PT [phone]  (pager)   Aniylah Avans, Tessie Fass 11/20/2012, 4:46 PM

## 2012-11-20 NOTE — Consult Note (Signed)
[facility] Renal Consultation Note    Indication for Consultation:  Management of ESRD/hemodialysis; anemia, hypertension/volume and secondary hyperparathyroidism  HPI: [name] is a pleasant, 79 y.o. female ESRD patient with a history of hypertension, mild aortic stenosis and TIA who presented to the ED via EMS complaining of cough, subjective fever, and two days of progressive weakness.  She has had a chronic cough for several years and has had a full work-up by pulmonary (Dr. Halford Chessman) with some symptom improvement per office visit note as recently as 11/12/12. Her cough had become more productive over the past four days, but she felt things would get better if given time.  The weakness is her most bothersome symptom and what ultimately prompted her to come to the hospital. Her chest x-ray is remarkable for an opacity in the left lower lung. At the time of this encounter, she states that this is the "worst I've ever felt".  She denies pain, SOB, wheezing but feels that her productive cough has improved with the antibiotics she has received here.   [name] dialyzes on the TTS schedule at the [facility]. She had a declot of her AV graft on 7/11 and dialyzed on schedule on 7/12 without incident.  Past Medical History  Diagnosis Date  . HTN (hypertension)   . ESRD on hemodialysis   . Mild aortic stenosis   . Dyslipidemia   . TIA (transient ischemic attack)   . Gout   . Benign bladder mass   . DJD (degenerative joint disease)   . Anemia   . Obstructive sleep apnea     mild  . Chronic cough    Past Surgical History  Procedure Laterality Date  . Nephrectomy      left  . Total abdominal hysterectomy    . Bone marrow biopsy    . Left arm dialysis graft     Family History  Problem Relation Age of Onset  . Hypertension Mother   . Diabetes Mother    Social History:  reports that she has never smoked. She has never used smokeless tobacco. She  reports that she does not drink alcohol. Her drug history is not on file. Allergies  Allergen Reactions  . Sulfa Antibiotics   . Sulfonamide Derivatives    Prior to Admission medications   Medication Sig Start Date End Date Taking? Authorizing Provider  albuterol (PROAIR HFA) 108 (90 BASE) MCG/ACT inhaler 1-2 puffs every 4-6 hours as needed    Yes Historical Provider, MD  amLODipine (NORVASC) 10 MG tablet Take 10 mg by mouth daily.   Yes Historical Provider, MD  aspirin 81 MG tablet Take 81 mg by mouth daily.     Yes Historical Provider, MD  atorvastatin (LIPITOR) 10 MG tablet Take 10 mg by mouth at bedtime.     Yes Historical Provider, MD  budesonide-formoterol (SYMBICORT) 160-4.5 MCG/ACT inhaler Inhale 2 puffs into the lungs 2 (two) times daily.   Yes Historical Provider, MD  calcium acetate (PHOSLO) 667 MG capsule Take 2,001 mg by mouth 3 (three) times daily with meals.   Yes Historical Provider, MD  fexofenadine (ALLEGRA) 180 MG tablet Take 180 mg by mouth daily as needed.   Yes Historical Provider, MD  Multiple Vitamin (MULTIVITAMIN) tablet Take 1 tablet by mouth daily.     Yes Historical Provider, MD   Current Facility-Administered Medications  Medication Dose Route Frequency Provider Last Rate Last Dose  . 0.9 %  sodium chloride infusion  Intravenous Continuous Bynum Bellows, MD      . albuterol (PROVENTIL HFA;VENTOLIN HFA) 108 (90 BASE) MCG/ACT inhaler 1-2 puff  1-2 puff Inhalation Q4H PRN Bynum Bellows, MD      . albuterol (PROVENTIL) (5 MG/ML) 0.5% nebulizer solution 2.5 mg  2.5 mg Nebulization [id number] PRN Delora Fuel, MD      . ipratropium (ATROVENT) nebulizer solution 0.5 mg  0.5 mg Nebulization Q4H Bynum Bellows, MD   0.5 mg at 11/20/12 0548   And  . albuterol (PROVENTIL) (5 MG/ML) 0.5% nebulizer solution 2.5 mg  2.5 mg Nebulization Q4H Bynum Bellows, MD   2.5 mg at 11/20/12 0548  . amLODipine (NORVASC) tablet 10 mg  10 mg Oral Daily Bynum Bellows, MD      . aspirin chewable  tablet 81 mg  81 mg Oral Daily Bynum Bellows, MD      . atorvastatin (LIPITOR) tablet 10 mg  10 mg Oral q1800 Bynum Bellows, MD      . budesonide-formoterol (SYMBICORT) 160-4.5 MCG/ACT inhaler 2 puff  2 puff Inhalation BID Bynum Bellows, MD      . calcium acetate (PHOSLO) capsule 2,001 mg  2,001 mg Oral TID WC Lauren Bajbus, RPH      . ceFEPIme (MAXIPIME) 1 g in dextrose 5 % 50 mL IVPB  1 g Intravenous STAT Jonathan A Binz, RPH      . ceFEPIme (MAXIPIME) 2 g in dextrose 5 % 50 mL IVPB  2 g Intravenous Q T,Th,Sat-1800 Ace Gins, RPH      . cinacalcet (SENSIPAR) tablet 30 mg  30 mg Oral Q supper Alvia Grove, PA-C      . darbepoetin [facility]) injection 40 mcg  40 mcg Intravenous Q Tue-HD Alvia Grove, PA-C      . doxercalciferol (HECTOROL) injection 1 mcg  1 mcg Intravenous Q T,Th,Sa-HD Alvia Grove, PA-C      . heparin injection 5,000 Units  5,000 Units Subcutaneous Q8H Srikar Janna Arch, MD      . loratadine (CLARITIN) tablet 10 mg  10 mg Oral Daily Bynum Bellows, MD      . multivitamin with minerals tablet 1 tablet  1 tablet Oral QHS Bynum Bellows, MD      . ondansetron [facility]) injection 4 mg  4 mg Intravenous Q6H PRN Jeryl Columbia, NP   4 mg at 11/20/12 0807  . vancomycin (VANCOCIN) 1,500 mg in sodium chloride 0.9 % 500 mL IVPB  1,500 mg Intravenous STAT Ace Gins, RPH      . vancomycin (VANCOCIN) IVPB 750 mg/150 ml premix  750 mg Intravenous Q T,Th,Sa-HD Ace Gins, [facility]       Labs: Basic Metabolic Panel:  Recent Labs Lab 11/20/12 0244 11/20/12 0900  NA 138 137  K 4.7 4.4  CL 93* 92*  CO2 30 28  GLUCOSE 113* 133*  BUN 47* 50*  CREATININE 8.84* 9.61*  CALCIUM 9.4 9.3   Liver Function Tests:  Recent Labs Lab 11/20/12 0244  AST 16  ALT 14  ALKPHOS 114  BILITOT 0.4  PROT 6.5  ALBUMIN 3.3*   CBC:  Recent Labs Lab 11/20/12 0244  WBC 8.2  NEUTROABS 6.7  HGB 9.8*  HCT 29.4*  MCV 98.0  PLT 126*   Studies/Results: Dg Chest 2  View  11/20/2012   *RADIOLOGY REPORT*  Clinical Data: Weakness and cough.  CHEST - 2 VIEW  Comparison: Chest radiograph  performed 01/20/2012  Findings: The lungs are well-aerated.  There is focal rounded opacity at the left lower lobe, overlying the left hilum on the frontal view.  Though this could reflect pneumonia, malignancy cannot be excluded.  Would correlate with the patient's symptoms, and consider CT for further evaluation.  There is no evidence of pleural effusion or pneumothorax. Additional atelectasis or scarring is noted at the left lung base.  The heart is normal in size; the mediastinal contour is within normal limits.  No acute osseous abnormalities are seen.  IMPRESSION: Focal rounded opacity at the left lower lung lobe, overlying the left hilum on the frontal view.  Though this could reflect pneumonia, malignancy cannot be excluded.  Would correlate with the patient's symptoms, and consider CT for further evaluation.  If a CT is not performed, a follow-up chest radiograph would be indicated after treatment for pneumonia.   Original Report Authenticated By: Santa Lighter, M.D.    ROS: Weakness, productive cough. All other systems negative except as described above   Physical Exam: Filed Vitals:   11/20/12 0530 11/20/12 0548 11/20/12 0635 11/20/12 0700  BP: 130/57  146/79   Pulse: 91 92 108   Temp:   99.2 F (37.3 C)   TempSrc:   Oral   Resp:  18 22   Height:    5\' 4"  (1.626 m)  Weight:    69.9 kg (154 lb 1.6 oz)  SpO2: 91% 93% 100%      General: Elderly, well nourished, in no acute distress. Head: Normocephalic, atraumatic, sclera non-icteric, mucus membranes are moist Neck: Supple. JVD not elevated. Lungs: Basilar crackles, Breathing is unlabored. Heart: RRR with S1 S2. No murmurs, rubs, or gallops appreciated. Abdomen: Soft, non-tender, non-distended with normoactive bowel sounds. No rebound/guarding. No obvious abdominal masses. M-S:  Strength and tone appear normal for  age. Lower extremities: Trace LE edema.  No ischemic changes or open wounds  Neuro: Alert and oriented X 3. Moves all extremities spontaneously. Psych:  Responds to questions appropriately with a normal affect. Dialysis Access: RFA AVG + bruit, trace bruising  Dialysis Orders [facility]  TTS) 3.5hrs   EDW 68 kg    2Ca     Heparin 4000    RFA AVG    Profile 4 Hectorol 1 mcg      Epogen 1000      Venofer  None June Labs: Hgb 10.7, P 4.3, Tsat 56%. PTH 543.2 in April  Assessment/Plan: 1. Acute respiratory failure due to HCAP - Per admit. LLL opacity on CXR. Blood cultures pending. On vanc and cefepime.  2. ESRD -  TTS, K+ 4.4. HD today on schedule 3. Hypertension/volume  - SBPs 130s-140s on amlodipine 10. Makes EDW as an op. Up ~2 kg here.  Try for UF 2L as tolerated. 4. Anemia  - Hgb 9.8 on weekly Epo. Aranesp 40 q Tues for here. Last Tsat 56%. No IV Fe 5. Metabolic bone disease -  Ca 9.4 (9.9 corrected) Phos controlled op on Phoslo 3 qac. Last PTH 543 in April. Continue Sensipar,  hectorol and op low Ca bath. 6. Nutrition - Alb 3.3. renal diet, multivitamin  Sonnie Alamo, PA-C [facility] Pager [phone] 11/20/2012, 11:26 AM   Patient seen and examined.  Agree with assessment and plan as above. Kelly Splinter  MD Pager [phone]    Cell  [phone] 11/20/2012, 4:03 PM

## 2012-11-20 NOTE — H&P (Signed)
Patient's PCP: Criselda Peaches, MD Patient's nephrologist: Dr. Marval Regal Patient's pulmonologist: Dr. Halford Chessman  Chief Complaint: Weakness, fever and cough  History of Present Illness: [name] is a 79 y.o. African American female with history of hypertension, end-stage renal disease on hemodialysis, mild aortic stenosis, hyperlipidemia, history of TIA, gout, degenerative joint disease, mild obstructive sleep apnea, and chronic cough who presents with the above complaints.  Patient reports that she has had chronic cough for the last 4-5 years and has seen both a pulmonologist and allergy immunologist.  Patient has seen her pulmonologist, Dr. Halford Chessman, on 11/12/2012 for chronic cough, it was thought that cough was due to postnasal drip and asthma, conservative management was recommended however if no improvement in symptoms was in the process of referring to ENT.  Yesterday at she noted that she was feeling weak and noted change in her cough from clear to yellow/green sputum.  She was also feeling feverish at home.  She was feeling nauseated but has not vomited.  She presented to the emergency department for further evaluation.  She was found to be febrile with a temperature of 102.5.  Chest x-ray shows a focal rounded opacity in the left lower lobe, hospitalist service was asked to admit the patient for management of pneumonia.  Patient denies any chest pain, does admit to being short of breath.  Denies any abdominal pain, diarrhea, headaches or vision changes.  Review of Systems: All systems reviewed with the patient and positive as per history of present illness, otherwise all other systems are negative.  Past Medical History  Diagnosis Date  . HTN (hypertension)   . ESRD on hemodialysis   . Mild aortic stenosis   . Dyslipidemia   . TIA (transient ischemic attack)   . Gout   . Benign bladder mass   . DJD (degenerative joint disease)   . Anemia   . Obstructive sleep apnea     mild  .  Chronic cough    Past Surgical History  Procedure Laterality Date  . Nephrectomy      left  . Total abdominal hysterectomy    . Bone marrow biopsy    . Left arm dialysis graft     Family History  Problem Relation Age of Onset  . Hypertension Mother   . Diabetes Mother    History   Social History  . Marital Status: Widowed    Spouse Name: N/A    Number of Children: N/A  . Years of Education: N/A   Occupational History  . Retired     Secretary/administrator   Social History Main Topics  . Smoking status: Never Smoker   . Smokeless tobacco: Never Used  . Alcohol Use: No  . Drug Use: Not on file  . Sexually Active: Not on file   Other Topics Concern  . Not on file   Social History Narrative  . No narrative on file   Allergies: Sulfa antibiotics and Sulfonamide derivatives  Home Meds: Prior to Admission medications   Medication Sig Start Date End Date Taking? Authorizing Provider  albuterol (PROAIR HFA) 108 (90 BASE) MCG/ACT inhaler 1-2 puffs every 4-6 hours as needed    Yes Historical Provider, MD  amLODipine (NORVASC) 10 MG tablet Take 10 mg by mouth daily.   Yes Historical Provider, MD  aspirin 81 MG tablet Take 81 mg by mouth daily.     Yes Historical Provider, MD  atorvastatin (LIPITOR) 10 MG tablet Take 10 mg by mouth at bedtime.  Yes Historical Provider, MD  budesonide-formoterol (SYMBICORT) 160-4.5 MCG/ACT inhaler Inhale 2 puffs into the lungs 2 (two) times daily.   Yes Historical Provider, MD  calcium acetate (PHOSLO) 667 MG capsule Take 667 mg by mouth 2 (two) times daily. 4 capsules daily   Yes Historical Provider, MD  fexofenadine (ALLEGRA) 180 MG tablet Take 180 mg by mouth daily as needed.   Yes Historical Provider, MD  Multiple Vitamin (MULTIVITAMIN) tablet Take 1 tablet by mouth daily.     Yes Historical Provider, MD    Physical Exam: Blood pressure 122/56, pulse 108, temperature 100.2 F (37.9 C), temperature source Oral, resp. rate 19, SpO2  92.00%. General: Awake, Oriented x3, No acute distress. HEENT: EOMI, Moist mucous membranes Neck: Supple CV: S1 and S2 Lungs: Crackles at bases bilaterally, expiratory wheezing at bases bilaterally. Abdomen: Soft, Nontender, Nondistended, +bowel sounds. Ext: Good pulses. Trace edema. No clubbing or cyanosis noted. Neuro: Cranial Nerves II-XII grossly intact. Has 5/5 motor strength in upper and lower extremities.  Lab results:  Recent Labs  11/20/12 0244  NA 138  K 4.7  CL 93*  CO2 30  GLUCOSE 113*  BUN 47*  CREATININE 8.84*  CALCIUM 9.4    Recent Labs  11/20/12 0244  AST 16  ALT 14  ALKPHOS 114  BILITOT 0.4  PROT 6.5  ALBUMIN 3.3*   No results found for this basename: LIPASE, AMYLASE,  in the last 72 hours  Recent Labs  11/20/12 0244  WBC 8.2  NEUTROABS 6.7  HGB 9.8*  HCT 29.4*  MCV 98.0  PLT 126*   No results found for this basename: CKTOTAL, CKMB, CKMBINDEX, TROPONINI,  in the last 72 hours No components found with this basename: POCBNP,  No results found for this basename: DDIMER,  in the last 72 hours No results found for this basename: HGBA1C,  in the last 72 hours No results found for this basename: CHOL, HDL, LDLCALC, TRIG, CHOLHDL, LDLDIRECT,  in the last 72 hours No results found for this basename: TSH, T4TOTAL, FREET3, T3FREE, THYROIDAB,  in the last 72 hours No results found for this basename: VITAMINB12, FOLATE, FERRITIN, TIBC, IRON, RETICCTPCT,  in the last 72 hours Imaging results:  Dg Chest 2 View  11/20/2012   *RADIOLOGY REPORT*  Clinical Data: Weakness and cough.  CHEST - 2 VIEW  Comparison: Chest radiograph performed 01/20/2012  Findings: The lungs are well-aerated.  There is focal rounded opacity at the left lower lobe, overlying the left hilum on the frontal view.  Though this could reflect pneumonia, malignancy cannot be excluded.  Would correlate with the patient's symptoms, and consider CT for further evaluation.  There is no evidence of  pleural effusion or pneumothorax. Additional atelectasis or scarring is noted at the left lung base.  The heart is normal in size; the mediastinal contour is within normal limits.  No acute osseous abnormalities are seen.  IMPRESSION: Focal rounded opacity at the left lower lung lobe, overlying the left hilum on the frontal view.  Though this could reflect pneumonia, malignancy cannot be excluded.  Would correlate with the patient's symptoms, and consider CT for further evaluation.  If a CT is not performed, a follow-up chest radiograph would be indicated after treatment for pneumonia.   Original Report Authenticated By: Santa Lighter, M.D.   Other results: EKG: Right bundle branch block, no prior EKG for comparison.  Assessment & Plan by Problem: Acute respiratory failure due to healthcare associated pneumonia (chronic dialysis patient) Start vancomycin  and cefepime.  Patient had blood cultures drawn in the emergency department and was given azithromycin and ceftriaxone.  Continue scheduled nebulizer treatments.  Patient will need a followup chest x-ray to indicate resolution after being treated for pneumonia.  Fever/early sepsis/SIRS Due to pneumonia.  Management as stated above.  Chronic cough Acute exacerbation due to pneumonia.  Continue to followup with pulmonary after discharge.  End stage renal disease Hemodialysis on Tuesday, Thursday, and Saturday.  Next dialysis due today.  Will need to contact renal in the morning for dialysis.  Anemia Likely due to chronic kidney disease.  Continue to monitor.  Hyperlipidemia Continue statin.  Hypertension Continue amlodipine.  Stable.  Reactive airway disease Likely due to pneumonia.  Continue breathing treatments.  Continue Symbicort.  History of TIA Continue aspirin.  Prophylaxis Subcutaneous heparin.  CODE STATUS Full code.  This was discussed with the patient on admission.  Disposition Admit the patient to MedSurg as  inpatient.  Time spent on admission, talking to the patient, and coordinating care was: 60 mins.  Auria Mckinlay A, MD 11/20/2012, 4:47 AM

## 2012-11-20 NOTE — Progress Notes (Signed)
Patient seen and examined by me.  Admitted early this AM by Dr. Reece Levy.  Paged renal for HD today.  K+ stable- not requiring O2 currently  Eulogio Bear DO

## 2012-11-21 DIAGNOSIS — R059 Cough, unspecified: Secondary | ICD-10-CM

## 2012-11-21 DIAGNOSIS — R05 Cough: Secondary | ICD-10-CM

## 2012-11-21 DIAGNOSIS — D631 Anemia in chronic kidney disease: Secondary | ICD-10-CM | POA: Diagnosis present

## 2012-11-21 DIAGNOSIS — N2581 Secondary hyperparathyroidism of renal origin: Secondary | ICD-10-CM | POA: Diagnosis present

## 2012-11-21 LAB — CBC
HCT: 29.1 % — ABNORMAL LOW (ref 36.0–46.0)
MCHC: 32.3 g/dL (ref 30.0–36.0)
Platelets: 101 10*3/uL — ABNORMAL LOW (ref 150–400)
RDW: 13.7 % (ref 11.5–15.5)

## 2012-11-21 LAB — BASIC METABOLIC PANEL
BUN: 27 mg/dL — ABNORMAL HIGH (ref 6–23)
Calcium: 9 mg/dL (ref 8.4–10.5)
Chloride: 99 mEq/L (ref 96–112)
Creatinine, Ser: 5.58 mg/dL — ABNORMAL HIGH (ref 0.50–1.10)
GFR calc Af Amer: 7 mL/min — ABNORMAL LOW (ref >90)

## 2012-11-21 MED ORDER — DARBEPOETIN ALFA-POLYSORBATE 100 MCG/0.5ML IJ SOLN: 100.0000 ug | INTRAMUSCULAR | Status: DC

## 2012-11-21 MED ORDER — AMLODIPINE BESYLATE 5 MG PO TABS
5.0000 mg | ORAL_TABLET | Freq: Every day | ORAL | Status: DC
  Filled 2012-11-21: qty 1

## 2012-11-21 NOTE — Progress Notes (Signed)
[facility] Progress Note  Subjective:   Sitting up on side of bed. Feels better but still with prod cough. Weakness subsiding. "I want to get up on my feet"  Objective Filed Vitals:   11/20/12 2140 11/21/12 0503 11/21/12 0837 11/21/12 0906  BP: 108/49 121/51    Pulse: 83   104  Temp: 99.2 F (37.3 C) 98.7 F (37.1 C)    TempSrc: Oral Oral    Resp: 20 18    Height:      Weight:      SpO2: 94% 92% 93% 92%   Physical Exam General: Elderly, alert, cooperative, NAD Heart: RRR, no murmur or rub cont sys & dia M @ ULSB ,goes down with comp of avg.  Gr 2/6 holosys M @ apex Lungs: Faint crackles left base, no wheezes, rales, rhonchi Liver down 4 cm Abdomen: Soft, NT, normal BS Extremities: Trace LE edema Dialysis Access: RFA AVF + bruit  Dialysis Orders [facility] TTS)  3.5hrs EDW 68 kg 2Ca Heparin 4000 RFA AVG Profile 4  Hectorol 1 mcg Epogen 1000 Venofer None  June Labs: Hgb 10.7, P 4.3, Tsat 56%. PTH 543.2 in April  Assessment/Plan: 1. Acute respiratory failure due to HCAP - Per admit. LLL opacity on CXR. Blood cultures neg thus far. On vanc and cefepime. May continue dosing abx with outpatient HD if warranted, but will need alternative to cefepime as it is not available. 2. ESRD - TTS, K+ 4.3. HD tomorrow 3. Hypertension/volume - SBPs 110s-120s on amlodipine 10. Makes EDW as an op. Up ~1 kg her per wgts. Try for UF 2L as tolerated. Lower med with better vol control & bedrest 4. Anemia - Hgb 9.4 < 9.8 on weekly Epo. Aranesp 40 q Tues here. Last Tsat 56%. No IV Fe. Follow CBC, will likely need ^Epo dose upon d/c. 5. Metabolic bone disease - Ca 9.0 (9.5 corrected) Phos controlled op on Phoslo 3 qac. Last PTH 543 in April. Continue Sensipar, hectorol and op low Ca bath. 6. Nutrition - Alb 3.3. renal diet, multivitamin 7. Dispo - Per primary. Follow-up CXR outpatient for resolution of HCAP.  Collene Leyden. Cletus Gash, PA-C [facility] Pager [phone] 11/21/2012,10:17  AM  LOS: 1 day  I have seen and examined this patient and agree with the plan of care seen , eval,examined and patient counseled. For HD in am. Can D/C home from our standpoint. Clinically doing very well. .  Opha Mcghee L 11/21/2012, 10:54 AM   Additional Objective Labs: Basic Metabolic Panel:  Recent Labs Lab 11/20/12 0244 11/20/12 0900 11/21/12 0545  NA 138 137 140  K 4.7 4.4 4.3  CL 93* 92* 99  CO2 30 28 31   GLUCOSE 113* 133* 105*  BUN 47* 50* 27*  CREATININE 8.84* 9.61* 5.58*  CALCIUM 9.4 9.3 9.0   Liver Function Tests:  Recent Labs Lab 11/20/12 0244  AST 16  ALT 14  ALKPHOS 114  BILITOT 0.4  PROT 6.5  ALBUMIN 3.3*   CBC:  Recent Labs Lab 11/20/12 0244 11/20/12 1030 11/21/12 0545  WBC 8.2 9.5 6.4  NEUTROABS 6.7  --   --   HGB 9.8* 9.1* 9.4*  HCT 29.4* 27.2* 29.1*  MCV 98.0 97.1 100.7*  PLT 126* 119* 101*   Blood Culture    Component Value Date/Time   SDES BLOOD RIGHT HAND 11/20/2012 0245   SPECREQUEST BOTTLES DRAWN AEROBIC AND ANAEROBIC 10CC BLUE,5CC RED 11/20/2012 0245   CULT  BLOOD CULTURE RECEIVED NO GROWTH TO DATE CULTURE WILL BE HELD FOR 5 DAYS BEFORE ISSUING A FINAL NEGATIVE REPORT 11/20/2012 0245   REPTSTATUS PENDING 11/20/2012 0245    Studies/Results: Dg Chest 2 View  11/20/2012   *RADIOLOGY REPORT*  Clinical Data: Weakness and cough.  CHEST - 2 VIEW  Comparison: Chest radiograph performed 01/20/2012  Findings: The lungs are well-aerated.  There is focal rounded opacity at the left lower lobe, overlying the left hilum on the frontal view.  Though this could reflect pneumonia, malignancy cannot be excluded.  Would correlate with the patient's symptoms, and consider CT for further evaluation.  There is no evidence of pleural effusion or pneumothorax. Additional atelectasis or scarring is noted at the left lung base.  The heart is normal in size; the mediastinal contour is within normal limits.  No acute osseous abnormalities are seen.   IMPRESSION: Focal rounded opacity at the left lower lung lobe, overlying the left hilum on the frontal view.  Though this could reflect pneumonia, malignancy cannot be excluded.  Would correlate with the patient's symptoms, and consider CT for further evaluation.  If a CT is not performed, a follow-up chest radiograph would be indicated after treatment for pneumonia.   Original Report Authenticated By: Santa Lighter, M.D.   Medications:   . ipratropium  0.5 mg Nebulization BID   And  . albuterol  2.5 mg Nebulization BID  . amLODipine  10 mg Oral Daily  . aspirin  81 mg Oral Daily  . atorvastatin  10 mg Oral q1800  . budesonide-formoterol  2 puff Inhalation BID  . calcium acetate  2,001 mg Oral TID WC  . ceFEPime (MAXIPIME) IV  2 g Intravenous Q T,Th,Sat-1800  . cinacalcet  30 mg Oral Q supper  . darbepoetin (ARANESP) injection - DIALYSIS  40 mcg Intravenous Q Tue-HD  . doxercalciferol  1 mcg Intravenous Q T,Th,Sa-HD  . heparin  5,000 Units Subcutaneous Q8H  . loratadine  10 mg Oral Daily  . multivitamin  1 tablet Oral Daily  . [START ON 11/22/2012] vancomycin  750 mg Intravenous Q T,Th,Sa-HD

## 2012-11-21 NOTE — Evaluation (Signed)
Physical Therapy Evaluation Patient Details Name: [name] MRN: [id number] DOB: Apr 19, 1922 Today's Date: 11/21/2012 Time: [id number] PT Time Calculation (min): 22 min  PT Assessment / Plan / Recommendation History of Present Illness  [name] is a 79 y.o. African American female with history of hypertension, end-stage renal disease on hemodialysis, mild aortic stenosis, hyperlipidemia, history of TIA, gout, degenerative joint disease, mild obstructive sleep apnea, and chronic cough who presents with the above complaints.  Patient reports that she has had chronic cough for the last 4-5 years and has seen both a pulmonologist and allergy immunologist.  Patient has seen her pulmonologist, Dr. Halford Chessman, on 11/12/2012 for chronic cough, it was thought that cough was due to postnasal drip and asthma, conservative management was recommended however if no improvement in symptoms was in the process of referring to ENT.  Clinical Impression  Pt admitted with weakness and SOB.  Now improved, but with decr act tolerance.   Pt currently with functional limitations due to the deficits listed below (see PT Problem List).   Pt will benefit from skilled PT to increase their independence and safety with mobility to allow discharge home alone.     PT Assessment  Patient needs continued PT services    Follow Up Recommendations  No PT follow up    Does the patient have the potential to tolerate intense rehabilitation      Barriers to Discharge        Equipment Recommendations  None recommended by PT    Recommendations for Other Services     Frequency Min 2X/week    Precautions / Restrictions Precautions Precautions: None   Pertinent Vitals/Pain       Mobility  Bed Mobility Bed Mobility: Not assessed Transfers Transfers: Sit to Stand;Stand to Sit Sit to Stand: 6: Modified independent (Device/Increase time);From chair/3-in-1 Stand to Sit: 6: Modified independent (Device/Increase  time);To chair/3-in-1 Details for Transfer Assistance: safe mobility Ambulation/Gait Ambulation/Gait Assistance: 5: Supervision Ambulation Distance (Feet): 500 Feet Assistive device: None Ambulation/Gait Assistance Details: steady gait Gait Pattern: Within Functional Limits Gait velocity: slower Stairs: Yes Stair Management Technique: One rail Right;Alternating pattern;Forwards Number of Stairs: 5 Wheelchair Mobility Wheelchair Mobility: No    Exercises     PT Diagnosis: Other (comment) (decr activity tolerance)  PT Problem List: Decreased activity tolerance PT Treatment Interventions: Gait training;Functional mobility training;Patient/family education     PT Goals(Current goals can be found in the care plan section) Acute Rehab PT Goals Patient Stated Goal: get back home by myself PT Goal Formulation: With patient Time For Goal Achievement: 11/21/12 Potential to Achieve Goals: Good  Visit Information  Last PT Received On: 11/21/12 Assistance Needed: +1 History of Present Illness: [name] is a 79 y.o. African American female with history of hypertension, end-stage renal disease on hemodialysis, mild aortic stenosis, hyperlipidemia, history of TIA, gout, degenerative joint disease, mild obstructive sleep apnea, and chronic cough who presents with the above complaints.  Patient reports that she has had chronic cough for the last 4-5 years and has seen both a pulmonologist and allergy immunologist.  Patient has seen her pulmonologist, Dr. Halford Chessman, on 11/12/2012 for chronic cough, it was thought that cough was due to postnasal drip and asthma, conservative management was recommended however if no improvement in symptoms was in the process of referring to ENT.       Prior [place] expects to be discharged to:: Private residence Living Arrangements: Alone Available Help at Discharge: Family;Available  PRN/intermittently Type of Home: House Home  Access: Stairs to enter Entrance Stairs-Number of Steps: 5 Entrance Stairs-Rails: Right Home Layout: One level Home Equipment: Shower seat - built in Prior Function Level of Independence: Independent Communication Communication: No difficulties    Cognition  Cognition Arousal/Alertness: Awake/alert Behavior During Therapy: WFL for tasks assessed/performed Overall Cognitive Status: Within Functional Limits for tasks assessed    Extremity/Trunk Assessment Upper Extremity Assessment Upper Extremity Assessment: Overall WFL for tasks assessed Lower Extremity Assessment Lower Extremity Assessment: Overall WFL for tasks assessed Cervical / Trunk Assessment Cervical / Trunk Assessment: Normal   Balance Balance Balance Assessed: No  End of Session PT - End of Session Activity Tolerance: Patient tolerated treatment well Patient left: in chair;with call bell/phone within reach Nurse Communication: Mobility status  GP     Beyonka Pitney, Tessie Fass 11/21/2012, 11:41 AM 11/21/2012  Donnella Sham, [place] [phone]  (pager)

## 2012-11-21 NOTE — Progress Notes (Signed)
[facility] PROGRESS NOTE  [name] [id number] DOB: 12/11/1921 DOA: 11/20/2012 PCP: Criselda Peaches, MD  Assessment/Plan: Acute respiratory failure due to healthcare associated pneumonia (chronic dialysis patient)  Start vancomycin and cefepime. Patient had blood cultures drawn in the emergency department and was given azithromycin and ceftriaxone. Continue scheduled nebulizer treatments. Patient will need a followup chest x-ray to indicate resolution after being treated for pneumonia.   Fever/early sepsis/SIRS  Due to pneumonia. Management as stated above.   Chronic cough  Acute exacerbation due to pneumonia. Continue to followup with pulmonary after discharge.   End stage renal disease  Hemodialysis on Tuesday, Thursday, and Saturday.   Anemia  Likely due to chronic kidney disease. Continue to monitor.   Hyperlipidemia  Continue statin.   Hypertension  Continue amlodipine. Stable.   Reactive airway disease  Likely due to pneumonia. Continue breathing treatments. Continue Symbicort.   History of TIA  Continue aspirin.   Code Status: full Family Communication: patient at bedside Disposition Plan: home- ?lives alone- PT eval   Consultants:  Renal- HD  Procedures:  HD- T/Th/Sat  Antibiotics:    HPI/Subjective: Feeling better- not as weak or SOB  Objective: Filed Vitals:   11/20/12 2140 11/21/12 0503 11/21/12 0837 11/21/12 0906  BP: 108/49 121/51    Pulse: 83   104  Temp: 99.2 F (37.3 C) 98.7 F (37.1 C)    TempSrc: Oral Oral    Resp: 20 18    Height:      Weight:      SpO2: 94% 92% 93% 92%    Intake/Output Summary (Last 24 hours) at 11/21/12 0925 Last data filed at 11/21/12 0600  Gross per 24 hour  Intake     50 ml  Output   1950 ml  Net  -1900 ml   Filed Weights   11/20/12 0700 11/20/12 1225 11/20/12 1636  Weight: 69.9 kg (154 lb 1.6 oz) 70 kg (154 lb 5.2 oz) 69 kg (152 lb 1.9 oz)    Exam:  General: Awake, Oriented  x3, No acute distress.  HEENT: EOMI, Moist mucous membranes  Neck: Supple  CV: S1 and S2  Lungs: clear, no wheezing  Abdomen: Soft, Nontender, Nondistended, +bowel sounds.  Ext: Good pulses. Trace edema. No clubbing or cyanosis noted.  Neuro: Cranial Nerves II-XII grossly intact. Has 5/5 motor strength in upper and lower extremities    Data Reviewed: Basic Metabolic Panel:  Recent Labs Lab 11/20/12 0244 11/20/12 0900 11/21/12 0545  NA 138 137 140  K 4.7 4.4 4.3  CL 93* 92* 99  CO2 30 28 31   GLUCOSE 113* 133* 105*  BUN 47* 50* 27*  CREATININE 8.84* 9.61* 5.58*  CALCIUM 9.4 9.3 9.0   Liver Function Tests:  Recent Labs Lab 11/20/12 0244  AST 16  ALT 14  ALKPHOS 114  BILITOT 0.4  PROT 6.5  ALBUMIN 3.3*   No results found for this basename: LIPASE, AMYLASE,  in the last 168 hours No results found for this basename: AMMONIA,  in the last 168 hours CBC:  Recent Labs Lab 11/20/12 0244 11/20/12 1030 11/21/12 0545  WBC 8.2 9.5 6.4  NEUTROABS 6.7  --   --   HGB 9.8* 9.1* 9.4*  HCT 29.4* 27.2* 29.1*  MCV 98.0 97.1 100.7*  PLT 126* 119* 101*   Cardiac Enzymes: No results found for this basename: CKTOTAL, CKMB, CKMBINDEX, TROPONINI,  in the last 168 hours BNP (last 3 results) No results found for this basename:  PROBNP,  in the last 8760 hours CBG: No results found for this basename: GLUCAP,  in the last 168 hours  Recent Results (from the past 240 hour(s))  CULTURE, BLOOD (ROUTINE X 2)     Status: None   Collection Time    11/20/12  2:30 AM      Result Value Range Status   Specimen Description BLOOD RIGHT ARM   Final   Special Requests BOTTLES DRAWN AEROBIC ONLY 10CC   Final   Culture  Setup Time 11/20/2012 09:18   Final   Culture     Final   Value:        BLOOD CULTURE RECEIVED NO GROWTH TO DATE CULTURE WILL BE HELD FOR 5 DAYS BEFORE ISSUING A FINAL NEGATIVE REPORT   Report Status PENDING   Incomplete  CULTURE, BLOOD (ROUTINE X 2)     Status: None    Collection Time    11/20/12  2:45 AM      Result Value Range Status   Specimen Description BLOOD RIGHT HAND   Final   Special Requests     Final   Value: BOTTLES DRAWN AEROBIC AND ANAEROBIC 10CC BLUE,5CC RED   Culture  Setup Time 11/20/2012 09:19   Final   Culture     Final   Value:        BLOOD CULTURE RECEIVED NO GROWTH TO DATE CULTURE WILL BE HELD FOR 5 DAYS BEFORE ISSUING A FINAL NEGATIVE REPORT   Report Status PENDING   Incomplete     Studies: Dg Chest 2 View  11/20/2012   *RADIOLOGY REPORT*  Clinical Data: Weakness and cough.  CHEST - 2 VIEW  Comparison: Chest radiograph performed 01/20/2012  Findings: The lungs are well-aerated.  There is focal rounded opacity at the left lower lobe, overlying the left hilum on the frontal view.  Though this could reflect pneumonia, malignancy cannot be excluded.  Would correlate with the patient's symptoms, and consider CT for further evaluation.  There is no evidence of pleural effusion or pneumothorax. Additional atelectasis or scarring is noted at the left lung base.  The heart is normal in size; the mediastinal contour is within normal limits.  No acute osseous abnormalities are seen.  IMPRESSION: Focal rounded opacity at the left lower lung lobe, overlying the left hilum on the frontal view.  Though this could reflect pneumonia, malignancy cannot be excluded.  Would correlate with the patient's symptoms, and consider CT for further evaluation.  If a CT is not performed, a follow-up chest radiograph would be indicated after treatment for pneumonia.   Original Report Authenticated By: Santa Lighter, M.D.    Scheduled Meds: . ipratropium  0.5 mg Nebulization BID   And  . albuterol  2.5 mg Nebulization BID  . amLODipine  10 mg Oral Daily  . aspirin  81 mg Oral Daily  . atorvastatin  10 mg Oral q1800  . budesonide-formoterol  2 puff Inhalation BID  . calcium acetate  2,001 mg Oral TID WC  . ceFEPime (MAXIPIME) IV  2 g Intravenous Q T,Th,Sat-1800  .  cinacalcet  30 mg Oral Q supper  . darbepoetin (ARANESP) injection - DIALYSIS  40 mcg Intravenous Q Tue-HD  . doxercalciferol  1 mcg Intravenous Q T,Th,Sa-HD  . heparin  5,000 Units Subcutaneous Q8H  . loratadine  10 mg Oral Daily  . multivitamin  1 tablet Oral Daily  . [START ON 11/22/2012] vancomycin  750 mg Intravenous Q T,Th,Sa-HD   Continuous Infusions:  Principal Problem:   Acute respiratory failure Active Problems:   Cough   ESRD (end stage renal disease)   Healthcare-associated pneumonia   Fever    Time spent: [place] [place] [facility] Pager [phone]. If 7PM-7AM, please contact night-coverage at [email or web address], password [facility] 11/21/2012, 9:25 AM  LOS: 1 day

## 2012-11-21 NOTE — Progress Notes (Signed)
Up in chair, no acute distress noted.  Call light in reach.

## 2012-11-22 DIAGNOSIS — N039 Chronic nephritic syndrome with unspecified morphologic changes: Secondary | ICD-10-CM

## 2012-11-22 DIAGNOSIS — N189 Chronic kidney disease, unspecified: Secondary | ICD-10-CM

## 2012-11-22 DIAGNOSIS — D631 Anemia in chronic kidney disease: Secondary | ICD-10-CM

## 2012-11-22 LAB — BASIC METABOLIC PANEL
BUN: 43 mg/dL — ABNORMAL HIGH (ref 6–23)
CO2: 27 mEq/L (ref 19–32)
Chloride: 97 mEq/L (ref 96–112)
Creatinine, Ser: 7.46 mg/dL — ABNORMAL HIGH (ref 0.50–1.10)
GFR calc Af Amer: 5 mL/min — ABNORMAL LOW (ref >90)
Potassium: 4.4 mEq/L (ref 3.5–5.1)

## 2012-11-22 LAB — CBC
HCT: 28 % — ABNORMAL LOW (ref 36.0–46.0)
Hemoglobin: 9.3 g/dL — ABNORMAL LOW (ref 12.0–15.0)
MCV: 98.9 fL (ref 78.0–100.0)
RBC: 2.83 MIL/uL — ABNORMAL LOW (ref 3.87–5.11)
WBC: 5.4 10*3/uL (ref 4.0–10.5)

## 2012-11-22 MED ORDER — AMLODIPINE BESYLATE 5 MG PO TABS
5.0000 mg | ORAL_TABLET | Freq: Every day | ORAL | Status: DC
  Administered 2012-11-22 – 2012-11-23 (×2): 5 mg via ORAL
  Filled 2012-11-22 (×3): qty 1

## 2012-11-22 NOTE — Progress Notes (Signed)
Patient ID: [name]  female  [id number]    DOB: 05-13-1921    DOA: 11/20/2012  PCP: Criselda Peaches, MD  Assessment/Plan:   Acute respiratory failure due to healthcare associated pneumonia (chronic dialysis patient)  -  Continue vancomycin and cefepime.  - Blood culture 1/2 growing gram-negative coccobacilli, follow sensitivities - Continue scheduled nebulizer treatments. - Patient will need a followup chest x-ray to indicate resolution after being treated for pneumonia.   Fever/early sepsis/SIRS  Due to pneumonia. Management as stated above.   Chronic cough  Acute exacerbation due to pneumonia. Continue to followup with pulmonary after discharge.   End stage renal disease  Hemodialysis on Tuesday, Thursday, and Saturday.   Anemia  Likely due to chronic kidney disease. Continue to monitor.   Hyperlipidemia  Continue statin.   Hypertension  Continue amlodipine. Stable.   History of TIA  Continue aspirin.  DVT Prophylaxis:  Code Status:  Disposition:  PT evaluation, lives at home    Subjective: Still somewhat coughing, hemodialysis today  Objective: Weight change:   Intake/Output Summary (Last 24 hours) at 11/22/12 1318 Last data filed at 11/22/12 0934  Gross per 24 hour  Intake    240 ml  Output      0 ml  Net    240 ml   Blood pressure 133/63, pulse 87, temperature 98.6 F (37 C), temperature source Oral, resp. rate 16, height 5\' 4"  (1.626 m), weight 69.9 kg (154 lb 1.6 oz), SpO2 95.00%.  Physical Exam: General: Alert and awake, oriented x3, not in any acute distress. CVS: S1-S2 clear, no murmur rubs or gallops Chest: Decreased breath sound at the bases  Abdomen: soft nontender, nondistended, normal bowel sounds  Extremities: no cyanosis, clubbing or edema noted bilaterally   Lab Results: Basic Metabolic Panel:  Recent Labs Lab 11/21/12 0545 11/22/12 0505  NA 140 137  K 4.3 4.4  CL 99 97  CO2 31 27  GLUCOSE 105* 102*  BUN 27*  43*  CREATININE 5.58* 7.46*  CALCIUM 9.0 9.2   Liver Function Tests:  Recent Labs Lab 11/20/12 0244  AST 16  ALT 14  ALKPHOS 114  BILITOT 0.4  PROT 6.5  ALBUMIN 3.3*   No results found for this basename: LIPASE, AMYLASE,  in the last 168 hours No results found for this basename: AMMONIA,  in the last 168 hours CBC:  Recent Labs Lab 11/20/12 0244  11/21/12 0545 11/22/12 0505  WBC 8.2  < > 6.4 5.4  NEUTROABS 6.7  --   --   --   HGB 9.8*  < > 9.4* 9.3*  HCT 29.4*  < > 29.1* 28.0*  MCV 98.0  < > 100.7* 98.9  PLT 126*  < > 101* 110*  < > = values in this interval not displayed. Cardiac Enzymes: No results found for this basename: CKTOTAL, CKMB, CKMBINDEX, TROPONINI,  in the last 168 hours BNP: No components found with this basename: POCBNP,  CBG: No results found for this basename: GLUCAP,  in the last 168 hours   Micro Results: Recent Results (from the past 240 hour(s))  CULTURE, BLOOD (ROUTINE X 2)     Status: None   Collection Time    11/20/12  2:30 AM      Result Value Range Status   Specimen Description BLOOD RIGHT ARM   Final   Special Requests BOTTLES DRAWN AEROBIC ONLY 10CC   Final   Culture  Setup Time 11/20/2012 09:18   Final  Culture     Final   Value:        BLOOD CULTURE RECEIVED NO GROWTH TO DATE CULTURE WILL BE HELD FOR 5 DAYS BEFORE ISSUING A FINAL NEGATIVE REPORT   Report Status PENDING   Incomplete  CULTURE, BLOOD (ROUTINE X 2)     Status: None   Collection Time    11/20/12  2:45 AM      Result Value Range Status   Specimen Description BLOOD RIGHT HAND   Final   Special Requests     Final   Value: BOTTLES DRAWN AEROBIC AND ANAEROBIC 10CC BLUE,5CC RED   Culture  Setup Time 11/20/2012 09:19   Final   Culture     Final   Value: GRAM NEGATIVE COCCOBACILLI     Note: Gram Stain Report Called to,Read Back By and Verified With: [facility] LAWSON @ 1351 11/21/12 BY KRAWS   Report Status PENDING   Incomplete    Studies/Results: Dg Chest 2  View  11/20/2012   *RADIOLOGY REPORT*  Clinical Data: Weakness and cough.  CHEST - 2 VIEW  Comparison: Chest radiograph performed 01/20/2012  Findings: The lungs are well-aerated.  There is focal rounded opacity at the left lower lobe, overlying the left hilum on the frontal view.  Though this could reflect pneumonia, malignancy cannot be excluded.  Would correlate with the patient's symptoms, and consider CT for further evaluation.  There is no evidence of pleural effusion or pneumothorax. Additional atelectasis or scarring is noted at the left lung base.  The heart is normal in size; the mediastinal contour is within normal limits.  No acute osseous abnormalities are seen.  IMPRESSION: Focal rounded opacity at the left lower lung lobe, overlying the left hilum on the frontal view.  Though this could reflect pneumonia, malignancy cannot be excluded.  Would correlate with the patient's symptoms, and consider CT for further evaluation.  If a CT is not performed, a follow-up chest radiograph would be indicated after treatment for pneumonia.   Original Report Authenticated By: Santa Lighter, M.D.    Medications: Scheduled Meds: . ipratropium  0.5 mg Nebulization BID   And  . albuterol  2.5 mg Nebulization BID  . amLODipine  5 mg Oral QHS  . aspirin  81 mg Oral Daily  . atorvastatin  10 mg Oral q1800  . budesonide-formoterol  2 puff Inhalation BID  . calcium acetate  2,001 mg Oral TID WC  . ceFEPime (MAXIPIME) IV  2 g Intravenous Q T,Th,Sat-1800  . cinacalcet  30 mg Oral Q supper  . [START ON 11/27/2012] darbepoetin (ARANESP) injection - DIALYSIS  100 mcg Intravenous Q Tue-HD  . doxercalciferol  1 mcg Intravenous Q T,Th,Sa-HD  . heparin  5,000 Units Subcutaneous Q8H  . loratadine  10 mg Oral Daily  . multivitamin  1 tablet Oral Daily  . vancomycin  750 mg Intravenous Q T,Th,Sa-HD      LOS: 2 days   Zera Markwardt M.D. [facility] 11/22/2012, 1:18 PM Pager: [phone]  If  7PM-7AM, please contact night-coverage [email or web address] Password [email or web address]

## 2012-11-22 NOTE — Progress Notes (Signed)
[facility] Progress Note  Subjective:   Getting stronger. Good PT session yesterday. No c/o's  Objective Filed Vitals:   11/21/12 2023 11/21/12 2201 11/22/12 0631 11/22/12 0914  BP:  119/48 135/58   Pulse:  101 87   Temp:  98.4 F (36.9 C) 98.6 F (37 C)   TempSrc:  Oral Oral   Resp:  18 16   Height:      Weight:      SpO2: 94% 93% 93% 96%   Physical Exam General: Elderly, alert, oriented, NAd Heart: RRR cont M dia goes away with comp of avg.  Gr 2/6 sem ULSB Lungs: Crackles left base, no wheezes, rales, rhonchi Abdomen: soft, NT, normal BS. Liver down 4 cm Extremities: No LE edema Dialysis Access: RFA AVG + bruit  Dialysis Orders [facility] TTS)  3.5hrs EDW 68 kg 2Ca Heparin 4000 RFA AVG Profile 4  Hectorol 1 mcg Epogen 1000 Venofer None  June Labs: Hgb 10.7, P 4.3, Tsat 56%. PTH 543.2 in April  Assessment/Plan: 1. Acute respiratory failure due to HCAP - Per admit. LLL opacity on CXR. Pos BC's with gram neg coccobacilli. On vanc and cefepime. May continue dosing abx with outpatient HD if warranted, but will need alternative to cefepime as it is not available.  2. ESRD - TTS, K+ 4.4. HD today 3. Hypertension/volume - SBPs 110s-130s on home dose amlodipine 5. Makes EDW as an op. Up ~1 kg her per wgts. Try for UF 2L 4. Anemia - Hgb 9.3 < 9.8. Aranesp 40 q Tues here. Last Tsat 56%. No IV Fe. Follow CBC, will likely need ^Epo dose upon d/c.  5. Metabolic bone disease - Ca 9.2 (9.7 corrected) Phos controlled op on Phoslo 3 qac. Last PTH 543 in April. Continue Sensipar, hectorol and op low Ca bath.  6. Nutrition - Alb 3.3. renal diet, multivitamin  7. Dispo - Per primary. Ok to d/c from renal standpoint. Follow-up CXR outpatient for resolution of HCAP  [name]. [name] [facility] Pager [phone] 11/22/2012,9:44 AM  LOS: 2 days   I have seen and examined this patient and agree with the plan of care seen,eval & examined. Discussed HD today  .  Wynell Halberg L 11/22/2012, 10:32 AM  Additional Objective Labs: Basic Metabolic Panel:  Recent Labs Lab 11/20/12 0900 11/21/12 0545 11/22/12 0505  NA 137 140 137  K 4.4 4.3 4.4  CL 92* 99 97  CO2 28 31 27   GLUCOSE 133* 105* 102*  BUN 50* 27* 43*  CREATININE 9.61* 5.58* 7.46*  CALCIUM 9.3 9.0 9.2   Liver Function Tests:  Recent Labs Lab 11/20/12 0244  AST 16  ALT 14  ALKPHOS 114  BILITOT 0.4  PROT 6.5  ALBUMIN 3.3*   CBC:  Recent Labs Lab 11/20/12 0244 11/20/12 1030 11/21/12 0545 11/22/12 0505  WBC 8.2 9.5 6.4 5.4  NEUTROABS 6.7  --   --   --   HGB 9.8* 9.1* 9.4* 9.3*  HCT 29.4* 27.2* 29.1* 28.0*  MCV 98.0 97.1 100.7* 98.9  PLT 126* 119* 101* 110*   Blood Culture    Component Value Date/Time   SDES BLOOD RIGHT HAND 11/20/2012 0245   SPECREQUEST BOTTLES DRAWN AEROBIC AND ANAEROBIC 10CC BLUE,5CC RED 11/20/2012 0245   CULT  Value: GRAM NEGATIVE COCCOBACILLI Note: Gram Stain Report Called to,Read Back By and Verified With: AMANDA LAWSON @ [id number] 11/21/12 BY KRAWS 11/20/2012 0245   REPTSTATUS PENDING 11/20/2012 0245    Studies/Results: No results  found. Medications:   . ipratropium  0.5 mg Nebulization BID   And  . albuterol  2.5 mg Nebulization BID  . amLODipine  5 mg Oral QHS  . aspirin  81 mg Oral Daily  . atorvastatin  10 mg Oral q1800  . budesonide-formoterol  2 puff Inhalation BID  . calcium acetate  2,001 mg Oral TID WC  . ceFEPime (MAXIPIME) IV  2 g Intravenous Q T,Th,Sat-1800  . cinacalcet  30 mg Oral Q supper  . [START ON 11/27/2012] darbepoetin (ARANESP) injection - DIALYSIS  100 mcg Intravenous Q Tue-HD  . doxercalciferol  1 mcg Intravenous Q T,Th,Sa-HD  . heparin  5,000 Units Subcutaneous Q8H  . loratadine  10 mg Oral Daily  . multivitamin  1 tablet Oral Daily  . vancomycin  750 mg Intravenous Q T,Th,Sa-HD

## 2012-11-22 NOTE — Progress Notes (Signed)
PT Cancellation Note  Patient Details Name: [name] MRN: [id number] DOB: 07-Jun-1921   Cancelled Treatment:    Reason Eval/Treat Not Completed: Patient declined, no reason specified 11/22/2012  Donnella Sham, PT [phone]  (pager)  Kazim Corrales, Tessie Fass 11/22/2012, 6:19 PM

## 2012-11-23 DIAGNOSIS — R7881 Bacteremia: Secondary | ICD-10-CM | POA: Diagnosis present

## 2012-11-23 DIAGNOSIS — N186 End stage renal disease: Secondary | ICD-10-CM

## 2012-11-23 DIAGNOSIS — N2581 Secondary hyperparathyroidism of renal origin: Secondary | ICD-10-CM

## 2012-11-23 DIAGNOSIS — N189 Chronic kidney disease, unspecified: Secondary | ICD-10-CM

## 2012-11-23 DIAGNOSIS — D631 Anemia in chronic kidney disease: Secondary | ICD-10-CM

## 2012-11-23 DIAGNOSIS — J189 Pneumonia, unspecified organism: Secondary | ICD-10-CM

## 2012-11-23 DIAGNOSIS — N039 Chronic nephritic syndrome with unspecified morphologic changes: Secondary | ICD-10-CM

## 2012-11-23 MED ORDER — DEXTROSE 5 % IV SOLN
1.0000 g | INTRAVENOUS | Status: DC
  Administered 2012-11-23: 1 g via INTRAVENOUS
  Filled 2012-11-23 (×3): qty 10

## 2012-11-23 NOTE — Progress Notes (Signed)
Patient ID: [name]  female  [id number]    DOB: [birth year]    DOA: 11/20/2012  PCP: Criselda Peaches, MD  Assessment/Plan:   Acute respiratory failure due to healthcare associated pneumonia (chronic dialysis patient)  -  Continue vancomycin and rocephin. DC'ed cefepime.  - Continue scheduled nebulizer treatments. - Patient will need a followup chest x-ray to indicate resolution after being treated for pneumonia.   H influenzae bacteremia: - await sensitivities, d/w Dr Megan Salon (ID), change to oral beta-lactam antibiotics if sensitive to complete the course of total 10 days.   Fever/early sepsis/SIRS  Due to pneumonia. Management as stated above.   Chronic cough  Acute exacerbation due to pneumonia. Continue to followup with pulmonary after discharge.   End stage renal disease  Hemodialysis on Tuesday, Thursday, and Saturday.   Anemia  Likely due to chronic kidney disease. Continue to monitor.   Hyperlipidemia  Continue statin.   Hypertension  Continue amlodipine. Stable.   History of TIA  Continue aspirin.  DVT Prophylaxis:  Code Status:  Disposition:  Patient declined PT. Hemodialysis tomorrow, will have sensitivities by then, DC tomorrow after HD   Subjective: Still somewhat coughing, feeling a whole lot better today  Objective: Weight change:   Intake/Output Summary (Last 24 hours) at 11/23/12 0904 Last data filed at 11/22/12 1536  Gross per 24 hour  Intake    240 ml  Output   1724 ml  Net  -1484 ml   Blood pressure 125/52, pulse 78, temperature 98.6 F (37 C), temperature source Oral, resp. rate 18, height 5\' 4"  (1.626 m), weight 68.2 kg (150 lb 5.7 oz), SpO2 94.00%.  Physical Exam: General: Alert and awake, oriented x3, not in any acute distress. CVS: S1-S2 clear, no murmur rubs or gallops Chest: Decreased breath sound at the bases  Abdomen: soft NT, ND, NBS  Extremities: no c/c/e bilaterally   Lab Results: Basic Metabolic  Panel:  Recent Labs Lab 11/21/12 0545 11/22/12 0505  NA 140 137  K 4.3 4.4  CL 99 97  CO2 31 27  GLUCOSE 105* 102*  BUN 27* 43*  CREATININE 5.58* 7.46*  CALCIUM 9.0 9.2   Liver Function Tests:  Recent Labs Lab 11/20/12 0244  AST 16  ALT 14  ALKPHOS 114  BILITOT 0.4  PROT 6.5  ALBUMIN 3.3*   No results found for this basename: LIPASE, AMYLASE,  in the last 168 hours No results found for this basename: AMMONIA,  in the last 168 hours CBC:  Recent Labs Lab 11/20/12 0244  11/21/12 0545 11/22/12 0505  WBC 8.2  < > 6.4 5.4  NEUTROABS 6.7  --   --   --   HGB 9.8*  < > 9.4* 9.3*  HCT 29.4*  < > 29.1* 28.0*  MCV 98.0  < > 100.7* 98.9  PLT 126*  < > 101* 110*  < > = values in this interval not displayed. Cardiac Enzymes: No results found for this basename: CKTOTAL, CKMB, CKMBINDEX, TROPONINI,  in the last 168 hours BNP: No components found with this basename: POCBNP,  CBG: No results found for this basename: GLUCAP,  in the last 168 hours   Micro Results: Recent Results (from the past 240 hour(s))  CULTURE, BLOOD (ROUTINE X 2)     Status: None   Collection Time    11/20/12  2:30 AM      Result Value Range Status   Specimen Description BLOOD RIGHT ARM   Final  Special Requests BOTTLES DRAWN AEROBIC ONLY 10CC   Final   Culture  Setup Time 11/20/2012 09:18   Final   Culture     Final   Value:        BLOOD CULTURE RECEIVED NO GROWTH TO DATE CULTURE WILL BE HELD FOR 5 DAYS BEFORE ISSUING A FINAL NEGATIVE REPORT   Report Status PENDING   Incomplete  CULTURE, BLOOD (ROUTINE X 2)     Status: None   Collection Time    11/20/12  2:45 AM      Result Value Range Status   Specimen Description BLOOD RIGHT HAND   Final   Special Requests     Final   Value: BOTTLES DRAWN AEROBIC AND ANAEROBIC 10CC BLUE,5CC RED   Culture  Setup Time 11/20/2012 09:19   Final   Culture     Final   Value: HAEMOPHILUS INFLUENZAE     Note: Gram Stain Report Called to,Read Back By and  Verified With: [place] @ 1351 11/21/12 BY KRAWS   Report Status PENDING   Incomplete    Studies/Results: Dg Chest 2 View  11/20/2012   *RADIOLOGY REPORT*  Clinical Data: Weakness and cough.  CHEST - 2 VIEW  Comparison: Chest radiograph performed 01/20/2012  Findings: The lungs are well-aerated.  There is focal rounded opacity at the left lower lobe, overlying the left hilum on the frontal view.  Though this could reflect pneumonia, malignancy cannot be excluded.  Would correlate with the patient's symptoms, and consider CT for further evaluation.  There is no evidence of pleural effusion or pneumothorax. Additional atelectasis or scarring is noted at the left lung base.  The heart is normal in size; the mediastinal contour is within normal limits.  No acute osseous abnormalities are seen.  IMPRESSION: Focal rounded opacity at the left lower lung lobe, overlying the left hilum on the frontal view.  Though this could reflect pneumonia, malignancy cannot be excluded.  Would correlate with the patient's symptoms, and consider CT for further evaluation.  If a CT is not performed, a follow-up chest radiograph would be indicated after treatment for pneumonia.   Original Report Authenticated By: Santa Lighter, M.D.    Medications: Scheduled Meds: . amLODipine  5 mg Oral QHS  . aspirin  81 mg Oral Daily  . atorvastatin  10 mg Oral q1800  . budesonide-formoterol  2 puff Inhalation BID  . calcium acetate  2,001 mg Oral TID WC  . cefTRIAXone (ROCEPHIN)  IV  1 g Intravenous Q24H  . cinacalcet  30 mg Oral Q supper  . [START ON 11/27/2012] darbepoetin (ARANESP) injection - DIALYSIS  100 mcg Intravenous Q Tue-HD  . doxercalciferol  1 mcg Intravenous Q T,Th,Sa-HD  . heparin  5,000 Units Subcutaneous Q8H  . loratadine  10 mg Oral Daily  . multivitamin  1 tablet Oral Daily  . vancomycin  750 mg Intravenous Q T,Th,Sa-HD      LOS: 3 days   Kelsie Kramp M.D. [facility] 11/23/2012, 9:04  AM Pager: [phone]  If 7PM-7AM, please contact night-coverage [email or web address] Password [email or web address]

## 2012-11-23 NOTE — Progress Notes (Signed)
ANTIBIOTIC CONSULT NOTE - FOLLOW UP  Pharmacy Consult for Vancomycin Indication: pneumonia  Allergies  Allergen Reactions  . Sulfa Antibiotics   . Sulfonamide Derivatives     Patient Measurements: Height: 5\' 4"  (162.6 cm) Weight: 150 lb 5.7 oz (68.2 kg) IBW/kg (Calculated) : 54.7 Adjusted Body Weight:   Vital Signs: Temp: 98.6 F (37 C) (07/18 0621) Temp src: Oral (07/18 0621) BP: 125/52 mmHg (07/18 0621) Pulse Rate: 78 (07/18 0621) Intake/Output from previous day: 07/17 0701 - 07/18 0700 In: 240 [P.O.:240] Out: 1724  Intake/Output from this shift:    Labs:  Recent Labs  11/20/12 0900 11/20/12 1030 11/21/12 0545 11/22/12 0505  WBC  --  9.5 6.4 5.4  HGB  --  9.1* 9.4* 9.3*  PLT  --  119* 101* 110*  CREATININE 9.61*  --  5.58* 7.46*   Estimated Creatinine Clearance: 4.9 ml/min (by C-G formula based on Cr of 7.46). No results found for this basename: VANCOTROUGH, Corlis Leak, VANCORANDOM, GENTTROUGH, GENTPEAK, GENTRANDOM, TOBRATROUGH, TOBRAPEAK, TOBRARND, AMIKACINPEAK, AMIKACINTROU, AMIKACIN,  in the last 72 hours   Microbiology: Recent Results (from the past 720 hour(s))  CULTURE, BLOOD (ROUTINE X 2)     Status: None   Collection Time    11/20/12  2:30 AM      Result Value Range Status   Specimen Description BLOOD RIGHT ARM   Final   Special Requests BOTTLES DRAWN AEROBIC ONLY 10CC   Final   Culture  Setup Time 11/20/2012 09:18   Final   Culture     Final   Value:        BLOOD CULTURE RECEIVED NO GROWTH TO DATE CULTURE WILL BE HELD FOR 5 DAYS BEFORE ISSUING A FINAL NEGATIVE REPORT   Report Status PENDING   Incomplete  CULTURE, BLOOD (ROUTINE X 2)     Status: None   Collection Time    11/20/12  2:45 AM      Result Value Range Status   Specimen Description BLOOD RIGHT HAND   Final   Special Requests     Final   Value: BOTTLES DRAWN AEROBIC AND ANAEROBIC 10CC BLUE,5CC RED   Culture  Setup Time 11/20/2012 09:19   Final   Culture     Final   Value:  HAEMOPHILUS INFLUENZAE     Note: Gram Stain Report Called to,Read Back By and Verified With: AMANDA LAWSON @ 1351 11/21/12 BY KRAWS   Report Status PENDING   Incomplete    Anti-infectives   Start     Dose/Rate Route Frequency Ordered Stop   11/22/12 1200  vancomycin (VANCOCIN) IVPB 750 mg/150 ml premix     750 mg 150 mL/hr over 60 Minutes Intravenous Every T-Th-Sa (Hemodialysis) 11/20/12 1301     11/20/12 2000  ceFEPIme (MAXIPIME) 2 g in dextrose 5 % 50 mL IVPB     2 g 100 mL/hr over 30 Minutes Intravenous Every T-Th-Sa (1800) 11/20/12 0951     11/20/12 1315  vancomycin (VANCOCIN) 1,500 mg in sodium chloride 0.9 % 250 mL IVPB     1,500 mg 250 mL/hr over 60 Minutes Intravenous Every Tue (Hemodialysis) 11/20/12 1301 11/20/12 1513   11/20/12 1200  ceFEPIme (MAXIPIME) 2 g in dextrose 5 % 50 mL IVPB  Status:  Discontinued     2 g 100 mL/hr over 30 Minutes Intravenous Every Hemodialysis 11/20/12 0902 11/20/12 0951   11/20/12 1200  vancomycin (VANCOCIN) IVPB 750 mg/150 ml premix  Status:  Discontinued     750 mg  150 mL/hr over 60 Minutes Intravenous Every T-Th-Sa (Hemodialysis) 11/20/12 0951 11/20/12 1300   11/20/12 1030  ceFEPIme (MAXIPIME) 1 g in dextrose 5 % 50 mL IVPB  Status:  Discontinued     1 g 100 mL/hr over 30 Minutes Intravenous STAT 11/20/12 0951 11/20/12 1300   11/20/12 1030  vancomycin (VANCOCIN) 1,500 mg in sodium chloride 0.9 % 500 mL IVPB  Status:  Discontinued     1,500 mg 250 mL/hr over 120 Minutes Intravenous STAT 11/20/12 0951 11/20/12 1300   11/20/12 0830  ceFEPIme (MAXIPIME) 1 g in dextrose 5 % 50 mL IVPB  Status:  Discontinued     1 g 100 mL/hr over 30 Minutes Intravenous 3 times per day 11/20/12 0822 11/20/12 0858   11/20/12 0345  cefTRIAXone (ROCEPHIN) 1 g in dextrose 5 % 50 mL IVPB  Status:  Discontinued     1 g 100 mL/hr over 30 Minutes Intravenous Every 24 hours 11/20/12 0340 11/20/12 0822   11/20/12 0345  azithromycin (ZITHROMAX) 500 mg in dextrose 5 % 250 mL  IVPB     500 mg 250 mL/hr over 60 Minutes Intravenous  Once 11/20/12 0340 11/20/12 0422      Assessment: 79 y/o f who presented with weakness, fever, and cough with yellow/green sputum. MD diagnosed patient with pneumonia.   Anticoagulation: None PTA. SQ heparin. CBC stable. Watch platelets.  Infectious Disease: Vanco/Cefepime for HCAP. Afebrile. WBC 5.4  Vanc 7/15 >> Cefepime 7/15 >>  7/15 BCX >> HAEMOPHILUS INFLUENZAE x 1 7/15 SX >> 7/15 GS >> 7/15 Influenza antigen >>  Cardiovascular: Hx HTN/dyslipidemia. 125/52, HR 78 Meds: Norvasc 5mg , ASA 81mg , Lipitor  Gastrointestinal/Nutrition: Renal diet, multivitamin  Neurology: Hx TIA  Nephrology: ESRD on HD every T/Th/Sat. Meds: Darbopoetin/Tuesday, doxercalciferol, cinacalcet, phoslo  Pulmonary: Hx obstructive sleep apnea. Symbicort, Claritin  Hematology/Oncology: Hx anemia. Hgb 9.3 and Plt 110 K (stable). darbopoetin.   Goal of Therapy:  Vanco level <20 post-HD prior to re-dosing  Plan:  1. Continue vancomycin 750mg  IV post HD - T/Th/Sat 2. Continue cefepime 2g IV post HD - T/Th/Sat 3. Recommend change abx to Zosyn or Unasyn for H.Influenza  [name] 11/23/2012,8:21 AM

## 2012-11-23 NOTE — Clinical Documentation Improvement (Signed)
THIS DOCUMENT IS NOT A PERMANENT PART OF THE MEDICAL RECORD  Please update your documentation with the medical record to reflect your response to this query. If you need help knowing how to do this please call [phone].  11/23/12  Dear Dr. Tana Coast,  In a better effort to capture your patient's severity of illness, reflect appropriate length of stay and utilization of resources, a review of the patient medical record has revealed: sepsis documented in 7/17 progress note.   Presents with weakness, fever and cough  V/S's on admission were: 99.5 oral, HR range 75-105, RR range 14-20, BP range 104/44, 88/50  Sats = 91-94%  WBC = 9.5  Being treated with Rocephin Q day, Maxipime T/Th/Sa (HD?), Vancomycin T/Th/Sa (HD?)  Admitted with pneumonia, acute respiratory failure, ESRD  Based on your clinical judgment, please clarify and document in a progress note and discharge summary if you feel:   Sepsis was:  Present or evolving on admission (POA)                        Acquired after admission             Other     In responding to this query please exercise your independent judgment.  The fact that a query is asked, does not imply that any particular answer is desired or expected.     Reviewed: reviewed, will addend dc summary     Thank You,  Zoila Shutter  Clinical Documentation Specialist: [place]

## 2012-11-23 NOTE — Progress Notes (Signed)
Subjective: Interval History: none.  Objective: Vital signs in last 24 hours: Temp:  [98.1 F (36.7 C)-98.8 F (37.1 C)] 98.6 F (37 C) (07/18 0621) Pulse Rate:  [78-89] 78 (07/18 0621) Resp:  [16-20] 18 (07/18 0621) BP: (114-133)/(45-78) 125/52 mmHg (07/18 0621) SpO2:  [93 %-97 %] 94 % (07/18 0833) Weight:  [68.2 kg (150 lb 5.7 oz)-69.9 kg (154 lb 1.6 oz)] 68.2 kg (150 lb 5.7 oz) (07/17 1536) Weight change:   Intake/Output from previous day: 07/17 0701 - 07/18 0700 In: 240 [P.O.:240] Out: 1724  Intake/Output this shift:    General appearance: alert, cooperative and no distress Resp: rales LLL Cardio: S1, S2 normal, systolic murmur: systolic ejection 2/6, decrescendo at 2nd left intercostal space and cont M @ ULSB goes down with avg comp GI: pos bs, liver down 2 cm Extremities: avg RFA  Lab Results:  Recent Labs  11/21/12 0545 11/22/12 0505  WBC 6.4 5.4  HGB 9.4* 9.3*  HCT 29.1* 28.0*  PLT 101* 110*   BMET:  Recent Labs  11/21/12 0545 11/22/12 0505  NA 140 137  K 4.3 4.4  CL 99 97  CO2 31 27  GLUCOSE 105* 102*  BUN 27* 43*  CREATININE 5.58* 7.46*  CALCIUM 9.0 9.2   No results found for this basename: PTH,  in the last 72 hours Iron Studies: No results found for this basename: IRON, TIBC, TRANSFERRIN, FERRITIN,  in the last 72 hours  Studies/Results: No results found.  I have reviewed the patient's current medications.  Assessment/Plan: 1 CRF stable HD TTS 2 Anemia stable 3 HPTH on med 4 Pneu stable can give meds at [facility] 5 HTN controlled P HD, epo, AB d/c when ok with primary    LOS: 3 days   Zohan Shiflet L 11/23/2012,9:40 AM

## 2012-11-23 NOTE — Progress Notes (Signed)
Physical Therapy Treatment Patient Details Name: [name] MRN: [id number] DOB: 1921/05/23 Today's Date: 11/23/2012 Time: [id number] PT Time Calculation (min): 23 min  PT Assessment / Plan / Recommendation  PT Comments   Patient making improvement with mobility and balance.  Gait velocity remains slow.  Should continue to improve with time.  Follow Up Recommendations  No PT follow up     Does the patient have the potential to tolerate intense rehabilitation     Barriers to Discharge        Equipment Recommendations  None recommended by PT    Recommendations for Other Services    Frequency Min 2X/week   Progress towards PT Goals Progress towards PT goals: Progressing toward goals  Plan Current plan remains appropriate    Precautions / Restrictions Precautions Precautions: None Restrictions Weight Bearing Restrictions: No   Pertinent Vitals/Pain     Mobility  Bed Mobility Bed Mobility: Supine to Sit;Sitting - Scoot to Edge of Bed Supine to Sit: 7: Independent Sitting - Scoot to Edge of Bed: 7: Independent Transfers Transfers: Sit to Stand;Stand to Sit Sit to Stand: 6: Modified independent (Device/Increase time);With upper extremity assist;From bed Stand to Sit: 6: Modified independent (Device/Increase time);With upper extremity assist;With armrests;To chair/3-in-1 Ambulation/Gait Ambulation/Gait Assistance: 5: Supervision Ambulation Distance (Feet): 300 Feet Assistive device: None Ambulation/Gait Assistance Details: Good gait pattern with good balance.  Slow gait speed. Gait Pattern: Within Functional Limits Gait velocity: Slow gait speed.    Exercises General Exercises - Lower Extremity Ankle Circles/Pumps: AROM;Both;15 reps;Seated Long Arc Quad: AROM;Both;15 reps;Seated Hip ABduction/ADduction: AROM;Both;15 reps;Seated Hip Flexion/Marching: AROM;Both;15 reps;Seated     PT Goals (current goals can now be found in the care plan section)    Visit  Information  Last PT Received On: 11/23/12 Assistance Needed: +1 History of Present Illness: [name] is a 79 y.o. African American female with history of hypertension, end-stage renal disease on hemodialysis, mild aortic stenosis, hyperlipidemia, history of TIA, gout, degenerative joint disease, mild obstructive sleep apnea, and chronic cough who presents with the above complaints.  Patient reports that she has had chronic cough for the last 4-5 years and has seen both a pulmonologist and allergy immunologist.  Patient has seen her pulmonologist, Dr. Halford Chessman, on 11/12/2012 for chronic cough, it was thought that cough was due to postnasal drip and asthma, conservative management was recommended however if no improvement in symptoms was in the process of referring to ENT.    Subjective Data  Subjective: "I think I'm going home tomorrow after dialysis"   Cognition  Cognition Arousal/Alertness: Awake/alert Behavior During Therapy: WFL for tasks assessed/performed Overall Cognitive Status: Within Functional Limits for tasks assessed    Balance  Balance Balance Assessed: Yes High Level Balance High Level Balance Activites: Direction changes;Turns;Sudden stops;Head turns High Level Balance Comments: No loss of balance with high level balance activities.  End of Session PT - End of Session Equipment Utilized During Treatment: Gait belt Activity Tolerance: Patient tolerated treatment well Patient left: in chair;with call bell/phone within reach Nurse Communication: Mobility status   GP     Despina Pole 11/23/2012, 4:13 PM Carita Pian. Sanjuana Kava, [place] Pager [phone]

## 2012-11-24 DIAGNOSIS — R7881 Bacteremia: Secondary | ICD-10-CM

## 2012-11-24 LAB — PHOSPHORUS: Phosphorus: 3 mg/dL (ref 2.3–4.6)

## 2012-11-24 LAB — CBC
HCT: 28.3 % — ABNORMAL LOW (ref 36.0–46.0)
Hemoglobin: 9.3 g/dL — ABNORMAL LOW (ref 12.0–15.0)
MCHC: 32.9 g/dL (ref 30.0–36.0)
MCV: 98.6 fL (ref 78.0–100.0)
RDW: 13.3 % (ref 11.5–15.5)
WBC: 4 10*3/uL (ref 4.0–10.5)

## 2012-11-24 LAB — RENAL FUNCTION PANEL
Albumin: 2.8 g/dL — ABNORMAL LOW (ref 3.5–5.2)
BUN: 27 mg/dL — ABNORMAL HIGH (ref 6–23)
Chloride: 95 mEq/L — ABNORMAL LOW (ref 96–112)
Creatinine, Ser: 6.57 mg/dL — ABNORMAL HIGH (ref 0.50–1.10)
Glucose, Bld: 96 mg/dL (ref 70–99)
Potassium: 4.1 mEq/L (ref 3.5–5.1)

## 2012-11-24 MED ORDER — NEPRO/CARBSTEADY PO LIQD
237.0000 mL | ORAL | Status: DC | PRN
  Filled 2012-11-24: qty 237

## 2012-11-24 MED ORDER — LIDOCAINE HCL (PF) 1 % IJ SOLN: 5.0000 mL | INTRAMUSCULAR | Status: DC | PRN

## 2012-11-24 MED ORDER — CEFUROXIME AXETIL 500 MG PO TABS
500.0000 mg | ORAL_TABLET | Freq: Every day | ORAL | Status: DC
Start: 2012-11-24 — End: 2012-11-24
  Filled 2012-11-24: qty 1

## 2012-11-24 MED ORDER — LIDOCAINE-PRILOCAINE 2.5-2.5 % EX CREA: 1.0000 "application " | TOPICAL_CREAM | CUTANEOUS | Status: DC | PRN

## 2012-11-24 MED ORDER — CEFUROXIME AXETIL 500 MG PO TABS: 500.0000 mg | ORAL_TABLET | Freq: Every day | ORAL | Status: DC

## 2012-11-24 MED ORDER — SODIUM CHLORIDE 0.9 % IV SOLN: 100.0000 mL | INTRAVENOUS | Status: DC | PRN

## 2012-11-24 MED ORDER — HEPARIN SODIUM (PORCINE) 1000 UNIT/ML DIALYSIS
100.0000 [IU]/kg | INTRAMUSCULAR | Status: DC | PRN
  Administered 2012-11-24: 6800 [IU] via INTRAVENOUS_CENTRAL
  Filled 2012-11-24: qty 7

## 2012-11-24 MED ORDER — PENTAFLUOROPROP-TETRAFLUOROETH EX AERO: 1.0000 "application " | INHALATION_SPRAY | CUTANEOUS | Status: DC | PRN

## 2012-11-24 MED ORDER — ALTEPLASE 2 MG IJ SOLR
2.0000 mg | Freq: Once | INTRAMUSCULAR | Status: DC | PRN
  Filled 2012-11-24: qty 2

## 2012-11-24 MED ORDER — HEPARIN SODIUM (PORCINE) 1000 UNIT/ML DIALYSIS: 1000.0000 [IU] | INTRAMUSCULAR | Status: DC | PRN

## 2012-11-24 NOTE — Procedures (Signed)
I was present at this session.  I have reviewed the session itself and made appropriate changes.  HD via RLA AVG. Vol ok, bp ok. Access press within limits.  [name] L 7/19/20148:50 AM

## 2012-11-24 NOTE — Discharge Summary (Addendum)
Physician Discharge Summary  Patient ID: [name] MRN: [id number] DOB/AGE: Aug 05, 1921 79 y.o.  Admit date: 11/20/2012 Discharge date: 11/24/2012  Primary Care Physician:  Criselda Peaches, MD  Discharge Diagnoses:   Present on Admission:  . (Resolved) Acute respiratory failure . Anemia of other chronic disease .  HYPERLIPIDEMIA . OBSTRUCTIVE SLEEP APNEA . ESRD (end stage renal disease) . Healthcare-associated pneumonia . (Resolved) Fever . Anemia in chronic kidney disease . Secondary hyperparathyroidism . Bacteremia- H influenzae  Consults:  Nephrology   Recommendations for Outpatient Follow-up:  Patient needs a followup chest x-ray to assess resolution of the pneumonia  Allergies:   Allergies  Allergen Reactions  . Sulfa Antibiotics   . Sulfonamide Derivatives      Discharge Medications:   Medication List         amLODipine 10 MG tablet  Commonly known as:  NORVASC  Take 10 mg by mouth daily.     aspirin 81 MG tablet  Take 81 mg by mouth daily.     atorvastatin 10 MG tablet  Commonly known as:  LIPITOR  Take 10 mg by mouth at bedtime.     budesonide-formoterol 160-4.5 MCG/ACT inhaler  Commonly known as:  SYMBICORT  Inhale 2 puffs into the lungs 2 (two) times daily.     calcium acetate 667 MG capsule  Commonly known as:  PHOSLO  Take 2,001 mg by mouth 3 (three) times daily with meals.     cefUROXime 500 MG tablet  Commonly known as:  CEFTIN  Take 1 tablet (500 mg total) by mouth daily with supper. X 10days     fexofenadine 180 MG tablet  Commonly known as:  ALLEGRA  Take 180 mg by mouth daily as needed.     multivitamin tablet  Take 1 tablet by mouth daily.     PROAIR HFA 108 (90 BASE) MCG/ACT inhaler  Generic drug:  albuterol  1-2 puffs every 4-6 hours as needed         Brief H and P: For complete details please refer to admission H and P, but in brief [name] is a 79 y.o. African American female with history of  hypertension, end-stage renal disease on hemodialysis, mild aortic stenosis, hyperlipidemia, history of TIA, gout, degenerative joint disease, mild obstructive sleep apnea, and chronic cough who presents with the above complaints. Patient reported that she has had chronic cough for the last 4-5 years and has seen both a pulmonologist and allergy immunologist. Patient has seen her pulmonologist, Dr. Halford Chessman, on 11/12/2012 for chronic cough, it was thought that cough was due to postnasal drip and asthma, conservative management was recommended however if no improvement in symptoms was in the process of referring to ENT.  A day before the admission, she noted that she was feeling weak and noted change in her cough from clear to yellow/green sputum. She was also feeling feverish at home. She was found to be febrile with a temperature of 102.5. Chest x-ray shows a focal rounded opacity in the left lower lobe, hospitalist service was asked to admit the patient for management of pneumonia.   Hospital Course:  Acute respiratory failure due to healthcare associated pneumonia (chronic dialysis patient): The patient was placed on vancomycin and cefepime initially. Blood cultures were also obtained which showed Haemophilus influenza. I discussed in detail with infectious disease, Dr. Megan Salon who recommended starting patient on Rocephin until awaiting sensitivities. Patient was placed on scheduled nebulizer treatments. Her respiratory status has significantly improved  and she is currently at her baseline. Per ID recommendations and blood culture sensitivities, she will finish the course of Ceftin for 10 days. Patient will need a followup chest x-ray to indicate resolution after being treated for pneumonia.   H influenzae bacteremia:  - Per sensitivities it is beta-lactamase, patient was placed on Ceftin for 10 days.   Fever/early sepsis/SIRS- likely evolving at admission due to H influenzae bacteremia: resolved    Chronic cough  Acute exacerbation due to pneumonia. Continue to followup with pulmonary after discharge.  End stage renal disease  Hemodialysis on Tuesday, Thursday, and Saturday. Patient received hemodialysis per her schedule. Nephrology was consulted who followed the patient closely Anemia  Likely due to chronic kidney disease. Continue to monitor.  Hyperlipidemia  Continue statin.  Hypertension  Continue amlodipine. Stable.  History of TIA  Continue aspirin.    Day of Discharge BP 131/56  Pulse 91  Temp(Src) 98.8 F (37.1 C) (Oral)  Resp 18  Ht 5\' 4"  (1.626 m)  Wt 67.7 kg (149 lb 4 oz)  BMI 25.61 kg/m2  SpO2 93%  Physical Exam: General: Alert and awake oriented x3 not in any acute distress. CVS: S1-S2 clear no murmur rubs or gallops Chest: clear to auscultation bilaterally, no wheezing rales or rhonchi Abdomen: soft nontender, nondistended, normal bowel sounds, no organomegaly Extremities: no cyanosis, clubbing or edema noted bilaterally Neuro: Cranial nerves II-XII intact, no focal neurological deficits   The results of significant diagnostics from this hospitalization (including imaging, microbiology, ancillary and laboratory) are listed below for reference.    LAB RESULTS: Basic Metabolic Panel:  Recent Labs Lab 11/22/12 0505 11/24/12 0734  NA 137 136  K 4.4 4.1  CL 97 95*  CO2 27 26  GLUCOSE 102* 96  BUN 43* 27*  CREATININE 7.46* 6.57*  CALCIUM 9.2 9.2  PHOS  --  3.1  3.0   Liver Function Tests:  Recent Labs Lab 11/20/12 0244 11/24/12 0734  AST 16  --   ALT 14  --   ALKPHOS 114  --   BILITOT 0.4  --   PROT 6.5  --   ALBUMIN 3.3* 2.8*   No results found for this basename: LIPASE, AMYLASE,  in the last 168 hours No results found for this basename: AMMONIA,  in the last 168 hours CBC:  Recent Labs Lab 11/20/12 0244  11/22/12 0505 11/24/12 0734  WBC 8.2  < > 5.4 4.0  NEUTROABS 6.7  --   --   --   HGB 9.8*  < > 9.3* 9.3*  HCT 29.4*   < > 28.0* 28.3*  MCV 98.0  < > 98.9 98.6  PLT 126*  < > 110* 151  < > = values in this interval not displayed. Cardiac Enzymes: No results found for this basename: CKTOTAL, CKMB, CKMBINDEX, TROPONINI,  in the last 168 hours BNP: No components found with this basename: POCBNP,  CBG: No results found for this basename: GLUCAP,  in the last 168 hours  Significant Diagnostic Studies:  Dg Chest 2 View  11/20/2012   *RADIOLOGY REPORT*  Clinical Data: Weakness and cough.  CHEST - 2 VIEW  Comparison: Chest radiograph performed 01/20/2012  Findings: The lungs are well-aerated.  There is focal rounded opacity at the left lower lobe, overlying the left hilum on the frontal view.  Though this could reflect pneumonia, malignancy cannot be excluded.  Would correlate with the patient's symptoms, and consider CT for further evaluation.  There is no evidence  of pleural effusion or pneumothorax. Additional atelectasis or scarring is noted at the left lung base.  The heart is normal in size; the mediastinal contour is within normal limits.  No acute osseous abnormalities are seen.  IMPRESSION: Focal rounded opacity at the left lower lung lobe, overlying the left hilum on the frontal view.  Though this could reflect pneumonia, malignancy cannot be excluded.  Would correlate with the patient's symptoms, and consider CT for further evaluation.  If a CT is not performed, a follow-up chest radiograph would be indicated after treatment for pneumonia.   Original Report Authenticated By: Santa Lighter, M.D.    2D ECHO:   Disposition and Follow-up: Discharge Orders   Future Orders Complete By Expires     Diet - low sodium heart healthy  As directed     Increase activity slowly  As directed         DISPOSITION: Home DIET: Heart healthy diet ACTIVITY: As tolerated TESTS THAT NEED FOLLOW-UP Chest x-ray  DISCHARGE FOLLOW-UP Follow-up Information   Follow up with GREEN, Keenan Bachelor, MD. Schedule an appointment as  soon as possible for a visit in 2 weeks. (for hospital follow-up)    Contact information:   [address] [place] [address] [phone]       Follow up with SOOD,VINEET, MD. Schedule an appointment as soon as possible for a visit in 2 weeks. (Will need followup chest x-ray)    Contact information:   [address] [place] [address] [phone]       Time spent on Discharge: 35 minutes  Signed:   Demone Lyles M.D. [facility] 11/24/2012, 1:21 PM Pager: [phone]

## 2012-11-24 NOTE — Progress Notes (Signed)
Subjective: Interval History: none.  Objective: Vital signs in last 24 hours: Temp:  [97.2 F (36.2 C)-98.6 F (37 C)] 97.2 F (36.2 C) (07/19 0715) Pulse Rate:  [76-93] 89 (07/19 0830) Resp:  [18-20] 18 (07/19 0715) BP: (125-144)/(55-71) 125/64 mmHg (07/19 0830) SpO2:  [94 %-98 %] 94 % (07/19 0715) Weight:  [70.1 kg (154 lb 8.7 oz)] 70.1 kg (154 lb 8.7 oz) (07/19 0715) Weight change:   Intake/Output from previous day: 07/18 0701 - 07/19 0700 In: 720 [P.O.:720] Out: -  Intake/Output this shift:    General appearance: alert, cooperative and mildly obese Resp: rales bibasilar Cardio: S1, S2 normal and systolic murmur: systolic ejection 2/6, decrescendo at 2nd left intercostal space GI: pos bs, liver down 2 cm Extremities: AVG RFA Cont M URSB ,goes away with comp of avg  Lab Results:  Recent Labs  11/22/12 0505 11/24/12 0734  WBC 5.4 4.0  HGB 9.3* 9.3*  HCT 28.0* 28.3*  PLT 110* 151   BMET:  Recent Labs  11/22/12 0505 11/24/12 0734  NA 137 136  K 4.4 4.1  CL 97 95*  CO2 27 26  GLUCOSE 102* 96  BUN 43* 27*  CREATININE 7.46* 6.57*  CALCIUM 9.2 9.2   No results found for this basename: PTH,  in the last 72 hours Iron Studies: No results found for this basename: IRON, TIBC, TRANSFERRIN, FERRITIN,  in the last 72 hours  Studies/Results: No results found.  I have reviewed the patient's current medications.  Assessment/Plan: 1 ESRD stable, on HD 2 Anemia stable 3 Pneu doing well, awaing sensitivities 4 HPTH vit D 5 HTn controlled P HD, epo , AB, ? D/C    LOS: 4 days   Timmy Bubeck L 11/24/2012,8:52 AM

## 2012-11-26 LAB — CULTURE, BLOOD (ROUTINE X 2)

## 2013-01-01 ENCOUNTER — Other Ambulatory Visit: Payer: Self-pay | Admitting: Internal Medicine

## 2013-01-01 ENCOUNTER — Ambulatory Visit
Admission: RE | Admit: 2013-01-01 | Discharge: 2013-01-01 | Disposition: A | Discharge status: Home / Self Care | Payer: Medicare Other | Source: Ambulatory Visit | Attending: Internal Medicine | Admitting: Internal Medicine

## 2013-01-01 DIAGNOSIS — J189 Pneumonia, unspecified organism: Secondary | ICD-10-CM

## 2013-01-10 LAB — CULTURE, BLOOD (ROUTINE X 2)

## 2013-02-15 ENCOUNTER — Encounter: Payer: Self-pay | Admitting: Pulmonary Disease

## 2013-02-15 ENCOUNTER — Ambulatory Visit (INDEPENDENT_AMBULATORY_CARE_PROVIDER_SITE_OTHER): Payer: Medicare Other | Admitting: Pulmonary Disease

## 2013-02-15 VITALS — BP 138/72 | HR 68 | Ht 62.0 in | Wt 151.0 lb

## 2013-02-15 DIAGNOSIS — R05 Cough: Secondary | ICD-10-CM | POA: Insufficient documentation

## 2013-02-15 MED ORDER — FLUTICASONE PROPIONATE 50 MCG/ACT NA SUSP: 2.0000 | Freq: Every day | NASAL | Status: DC

## 2013-02-15 NOTE — Patient Instructions (Signed)
Flonase two sprays in each nostril daily Stop symbicort Proair two puffs up to four times per day as needed for cough, wheezing, or chest congestion Call if not feeling better Follow up in 4 months

## 2013-02-15 NOTE — Assessment & Plan Note (Signed)
Likely related to post-nasal drip and ? Asthma (less likely).  Advised her to resume flonase.  She is to continue allegra.  Will d/c symbicort.  She is to continue prn albuterol  Advised her to call in few weeks if cough not improved.

## 2013-02-15 NOTE — Progress Notes (Signed)
Chief Complaint  Patient presents with  . Cough    Cough is still present. States that it has gotten worse. Consistent through out the entire day.    [id number] [place]  History of Present Illness: [name] is a 79 y.o. female never smoker with chronic cough likely from post-nasal drip, and possible asthma.  Her cough has flared up again.  This started again about one month ago.  She is not longer using flonase.  She is getting post-nasal drip.  She has been using symbicort, but this does not seem to help.  She has not been using proair.  She brings up clear sputum.  She denies wheeze, or chest tightness.  She is not having reflux.  She was treated for pneumonia in July.   TESTS: PFT 03/26/10 >> FEV1 1.28(65%), FEV1% 71, positive bronchodilator response, TLC 3.79 (69%), DLCO 63%. RAST 07/22/10 >>  negative, IgE 13.9 Exhaled nitric oxide 01/20/12 >> 18 ppb Spirometry 01/20/12 >> 1.23 (83%), FEV1% 73, poor test effort CXR 01/20/12 >> Unchanged mildly hyperexpanded lungs without acute cardiopulmonary disease.   She  has a past medical history of HTN (hypertension); ESRD on hemodialysis; Mild aortic stenosis; Dyslipidemia; TIA (transient ischemic attack); Gout; Benign bladder mass; DJD (degenerative joint disease); Anemia; Obstructive sleep apnea; and Chronic cough.  She  has past surgical history that includes Nephrectomy; Total abdominal hysterectomy; Bone marrow biopsy; and left arm dialysis graft.  Current Outpatient Prescriptions on File Prior to Visit  Medication Sig Dispense Refill  . albuterol (PROAIR HFA) 108 (90 BASE) MCG/ACT inhaler 1-2 puffs every 4-6 hours as needed       . amLODipine (NORVASC) 10 MG tablet Take 10 mg by mouth daily.      Marland Kitchen aspirin 81 MG tablet Take 81 mg by mouth daily.        Marland Kitchen atorvastatin (LIPITOR) 10 MG tablet Take 10 mg by mouth at bedtime.        . budesonide-formoterol (SYMBICORT) 160-4.5 MCG/ACT inhaler Inhale 2 puffs into the lungs 2 (two)  times daily.      . calcium acetate (PHOSLO) 667 MG capsule Take 2,001 mg by mouth 3 (three) times daily with meals.      . fexofenadine (ALLEGRA) 180 MG tablet Take 180 mg by mouth daily as needed.      . Multiple Vitamin (MULTIVITAMIN) tablet Take 1 tablet by mouth daily.        . [DISCONTINUED] chlorpheniramine (CHLOR-TRIMETON) 4 MG tablet One pill at night as needed for cough  30 tablet  0   No current facility-administered medications on file prior to visit.    Allergies  Allergen Reactions  . Sulfa Antibiotics   . Sulfonamide Derivatives     Physical Exam:  General - no distress  HEENT - no sinus tenderness, no oral exudate, no LAN Cardiac - s1s2 regular Chest - no wheeze/rales Abdomen - soft, nontender Extremities - no edema Skin - no rashes Neurologic - normal strength Psychiatric - normal mood, behavior   Assessment/Plan:  Chesley Mires, MD [place] 02/15/2013, 9:42 AM Pager:  [phone] After 3pm call: [phone]

## 2013-03-20 ENCOUNTER — Other Ambulatory Visit: Payer: Self-pay | Admitting: Internal Medicine

## 2013-03-20 ENCOUNTER — Ambulatory Visit
Admission: RE | Admit: 2013-03-20 | Discharge: 2013-03-20 | Disposition: A | Discharge status: Home / Self Care | Payer: Medicare Other | Source: Ambulatory Visit | Attending: Internal Medicine | Admitting: Internal Medicine

## 2013-03-20 DIAGNOSIS — R52 Pain, unspecified: Secondary | ICD-10-CM

## 2013-05-07 ENCOUNTER — Encounter (HOSPITAL_COMMUNITY): Payer: Self-pay | Admitting: Emergency Medicine

## 2013-05-07 ENCOUNTER — Emergency Department (HOSPITAL_COMMUNITY): Payer: Medicare Other

## 2013-05-07 ENCOUNTER — Inpatient Hospital Stay (HOSPITAL_COMMUNITY)
Admission: EM | Admit: 2013-05-07 | Discharge: 2013-05-11 | DRG: 152 | Disposition: A | Discharge status: Home / Self Care | Payer: Medicare Other | Attending: Internal Medicine | Admitting: Internal Medicine

## 2013-05-07 DIAGNOSIS — J101 Influenza due to other identified influenza virus with other respiratory manifestations: Secondary | ICD-10-CM

## 2013-05-07 DIAGNOSIS — Z602 Problems related to living alone: Secondary | ICD-10-CM

## 2013-05-07 DIAGNOSIS — I120 Hypertensive chronic kidney disease with stage 5 chronic kidney disease or end stage renal disease: Secondary | ICD-10-CM | POA: Diagnosis present

## 2013-05-07 DIAGNOSIS — Z905 Acquired absence of kidney: Secondary | ICD-10-CM

## 2013-05-07 DIAGNOSIS — N2581 Secondary hyperparathyroidism of renal origin: Secondary | ICD-10-CM

## 2013-05-07 DIAGNOSIS — R509 Fever, unspecified: Secondary | ICD-10-CM

## 2013-05-07 DIAGNOSIS — N186 End stage renal disease: Secondary | ICD-10-CM

## 2013-05-07 DIAGNOSIS — R053 Chronic cough: Secondary | ICD-10-CM

## 2013-05-07 DIAGNOSIS — Z7982 Long term (current) use of aspirin: Secondary | ICD-10-CM

## 2013-05-07 DIAGNOSIS — J111 Influenza due to unidentified influenza virus with other respiratory manifestations: Principal | ICD-10-CM | POA: Diagnosis present

## 2013-05-07 DIAGNOSIS — Z79899 Other long term (current) drug therapy: Secondary | ICD-10-CM

## 2013-05-07 DIAGNOSIS — G4733 Obstructive sleep apnea (adult) (pediatric): Secondary | ICD-10-CM | POA: Diagnosis present

## 2013-05-07 DIAGNOSIS — Z833 Family history of diabetes mellitus: Secondary | ICD-10-CM

## 2013-05-07 DIAGNOSIS — Z8249 Family history of ischemic heart disease and other diseases of the circulatory system: Secondary | ICD-10-CM

## 2013-05-07 DIAGNOSIS — J9600 Acute respiratory failure, unspecified whether with hypoxia or hypercapnia: Secondary | ICD-10-CM | POA: Diagnosis present

## 2013-05-07 DIAGNOSIS — Z8673 Personal history of transient ischemic attack (TIA), and cerebral infarction without residual deficits: Secondary | ICD-10-CM

## 2013-05-07 DIAGNOSIS — M109 Gout, unspecified: Secondary | ICD-10-CM | POA: Diagnosis present

## 2013-05-07 DIAGNOSIS — R05 Cough: Secondary | ICD-10-CM

## 2013-05-07 DIAGNOSIS — E785 Hyperlipidemia, unspecified: Secondary | ICD-10-CM | POA: Diagnosis present

## 2013-05-07 DIAGNOSIS — R059 Cough, unspecified: Secondary | ICD-10-CM

## 2013-05-07 DIAGNOSIS — Z882 Allergy status to sulfonamides status: Secondary | ICD-10-CM

## 2013-05-07 DIAGNOSIS — I359 Nonrheumatic aortic valve disorder, unspecified: Secondary | ICD-10-CM | POA: Diagnosis present

## 2013-05-07 DIAGNOSIS — Z992 Dependence on renal dialysis: Secondary | ICD-10-CM | POA: Diagnosis present

## 2013-05-07 DIAGNOSIS — D631 Anemia in chronic kidney disease: Secondary | ICD-10-CM | POA: Diagnosis present

## 2013-05-07 LAB — CBC WITH DIFFERENTIAL/PLATELET
Basophils Absolute: 0 10*3/uL (ref 0.0–0.1)
Basophils Relative: 1 % (ref 0–1)
Eosinophils Absolute: 0 10*3/uL (ref 0.0–0.7)
Eosinophils Relative: 1 % (ref 0–5)
Hemoglobin: 8.5 g/dL — ABNORMAL LOW (ref 12.0–15.0)
Lymphs Abs: 0.5 10*3/uL — ABNORMAL LOW (ref 0.7–4.0)
MCH: 31.7 pg (ref 26.0–34.0)
MCHC: 32.3 g/dL (ref 30.0–36.0)
MCV: 98.1 fL (ref 78.0–100.0)
Monocytes Absolute: 0.4 10*3/uL (ref 0.1–1.0)
Monocytes Relative: 9 % (ref 3–12)
Neutrophils Relative %: 77 % (ref 43–77)
Platelets: 151 10*3/uL (ref 150–400)
RBC: 2.68 MIL/uL — ABNORMAL LOW (ref 3.87–5.11)

## 2013-05-07 LAB — INFLUENZA PANEL BY PCR (TYPE A & B)
H1N1 flu by pcr: NOT DETECTED
Influenza A By PCR: POSITIVE — AB

## 2013-05-07 LAB — BASIC METABOLIC PANEL
BUN: 14 mg/dL (ref 6–23)
Calcium: 10.3 mg/dL (ref 8.4–10.5)
Creatinine, Ser: 4.84 mg/dL — ABNORMAL HIGH (ref 0.50–1.10)
GFR calc Af Amer: 8 mL/min — ABNORMAL LOW (ref >90)
GFR calc non Af Amer: 7 mL/min — ABNORMAL LOW (ref >90)
Glucose, Bld: 89 mg/dL (ref 70–99)
Potassium: 4.6 mEq/L (ref 3.7–5.3)

## 2013-05-07 MED ORDER — OSELTAMIVIR PHOSPHATE 75 MG PO CAPS
75.0000 mg | ORAL_CAPSULE | Freq: Two times a day (BID) | ORAL | Status: DC
  Filled 2013-05-07: qty 1

## 2013-05-07 MED ORDER — SODIUM CHLORIDE 0.9 % IJ SOLN
3.0000 mL | Freq: Two times a day (BID) | INTRAMUSCULAR | Status: DC
  Administered 2013-05-07 – 2013-05-11 (×6): 3 mL via INTRAVENOUS

## 2013-05-07 MED ORDER — SODIUM CHLORIDE 0.9 % IV SOLN: 250.0000 mL | INTRAVENOUS | Status: DC | PRN

## 2013-05-07 MED ORDER — MENTHOL 3 MG MT LOZG
1.0000 | LOZENGE | OROMUCOSAL | Status: DC | PRN
  Filled 2013-05-07: qty 9

## 2013-05-07 MED ORDER — CIPROFLOXACIN HCL 500 MG PO TABS: 500.0000 mg | ORAL_TABLET | Freq: Two times a day (BID) | ORAL | Status: DC

## 2013-05-07 MED ORDER — ASPIRIN 81 MG PO CHEW
81.0000 mg | CHEWABLE_TABLET | Freq: Every day | ORAL | Status: DC
  Administered 2013-05-07 – 2013-05-11 (×5): 81 mg via ORAL
  Filled 2013-05-07 (×5): qty 1

## 2013-05-07 MED ORDER — AMLODIPINE BESYLATE 10 MG PO TABS
10.0000 mg | ORAL_TABLET | Freq: Every day | ORAL | Status: DC
  Administered 2013-05-07 – 2013-05-10 (×4): 10 mg via ORAL
  Filled 2013-05-07 (×6): qty 1

## 2013-05-07 MED ORDER — PHENOL 1.4 % MT LIQD
1.0000 | OROMUCOSAL | Status: DC | PRN
  Filled 2013-05-07: qty 177

## 2013-05-07 MED ORDER — OSELTAMIVIR PHOSPHATE 30 MG PO CAPS
30.0000 mg | ORAL_CAPSULE | ORAL | Status: DC
  Administered 2013-05-08: 30 mg via ORAL
  Filled 2013-05-07: qty 1

## 2013-05-07 MED ORDER — ACETAMINOPHEN 500 MG PO TABS
500.0000 mg | ORAL_TABLET | Freq: Four times a day (QID) | ORAL | Status: DC | PRN
  Administered 2013-05-07 – 2013-05-09 (×2): 500 mg via ORAL
  Filled 2013-05-07 (×2): qty 1

## 2013-05-07 MED ORDER — CALCIUM ACETATE 667 MG PO CAPS
2001.0000 mg | ORAL_CAPSULE | Freq: Three times a day (TID) | ORAL | Status: DC
  Administered 2013-05-08 – 2013-05-11 (×4): 2001 mg via ORAL
  Filled 2013-05-07 (×15): qty 3

## 2013-05-07 MED ORDER — TRAMADOL-ACETAMINOPHEN 37.5-325 MG PO TABS
1.0000 | ORAL_TABLET | Freq: Three times a day (TID) | ORAL | Status: DC
  Administered 2013-05-07 – 2013-05-11 (×8): 1 via ORAL
  Filled 2013-05-07 (×9): qty 1

## 2013-05-07 MED ORDER — ONDANSETRON HCL 4 MG PO TABS
4.0000 mg | ORAL_TABLET | Freq: Four times a day (QID) | ORAL | Status: DC | PRN
  Filled 2013-05-07: qty 1

## 2013-05-07 MED ORDER — ALBUTEROL SULFATE HFA 108 (90 BASE) MCG/ACT IN AERS: 1.0000 | INHALATION_SPRAY | RESPIRATORY_TRACT | Status: DC | PRN

## 2013-05-07 MED ORDER — AZITHROMYCIN 500 MG PO TABS
500.0000 mg | ORAL_TABLET | ORAL | Status: DC
  Administered 2013-05-07 – 2013-05-09 (×2): 500 mg via ORAL
  Filled 2013-05-07 (×4): qty 1

## 2013-05-07 MED ORDER — HYDROCOD POLST-CHLORPHEN POLST 10-8 MG/5ML PO LQCR
5.0000 mL | Freq: Once | ORAL | Status: AC
  Administered 2013-05-07: 5 mL via ORAL
  Filled 2013-05-07: qty 5

## 2013-05-07 MED ORDER — ATORVASTATIN CALCIUM 10 MG PO TABS
10.0000 mg | ORAL_TABLET | Freq: Every day | ORAL | Status: DC
  Administered 2013-05-07 – 2013-05-10 (×3): 10 mg via ORAL
  Filled 2013-05-07 (×6): qty 1

## 2013-05-07 MED ORDER — SODIUM CHLORIDE 0.9 % IJ SOLN: 3.0000 mL | INTRAMUSCULAR | Status: DC | PRN

## 2013-05-07 MED ORDER — SODIUM CHLORIDE 0.9 % IV BOLUS (SEPSIS)
500.0000 mL | Freq: Once | INTRAVENOUS | Status: AC
  Administered 2013-05-07: 500 mL via INTRAVENOUS

## 2013-05-07 MED ORDER — HEPARIN SODIUM (PORCINE) 5000 UNIT/ML IJ SOLN
5000.0000 [IU] | Freq: Three times a day (TID) | INTRAMUSCULAR | Status: DC
  Administered 2013-05-07 – 2013-05-11 (×9): 5000 [IU] via SUBCUTANEOUS
  Filled 2013-05-07 (×15): qty 1

## 2013-05-07 MED ORDER — ACETAMINOPHEN 325 MG PO TABS
650.0000 mg | ORAL_TABLET | Freq: Once | ORAL | Status: AC
Start: 2013-05-07 — End: 2013-05-07
  Administered 2013-05-07: 650 mg via ORAL
  Filled 2013-05-07: qty 2

## 2013-05-07 MED ORDER — OSELTAMIVIR PHOSPHATE 30 MG PO CAPS
30.0000 mg | ORAL_CAPSULE | Freq: Once | ORAL | Status: AC
  Administered 2013-05-07: 30 mg via ORAL
  Filled 2013-05-07 (×2): qty 1

## 2013-05-07 MED ORDER — CINACALCET HCL 30 MG PO TABS
30.0000 mg | ORAL_TABLET | Freq: Every day | ORAL | Status: DC
  Administered 2013-05-09 – 2013-05-11 (×3): 30 mg via ORAL
  Filled 2013-05-07 (×7): qty 1

## 2013-05-07 MED ORDER — ONDANSETRON HCL 4 MG/2ML IJ SOLN
4.0000 mg | Freq: Four times a day (QID) | INTRAMUSCULAR | Status: DC | PRN
  Administered 2013-05-09 – 2013-05-10 (×2): 4 mg via INTRAVENOUS
  Filled 2013-05-07 (×2): qty 2

## 2013-05-07 MED ORDER — SODIUM CHLORIDE 0.9 % IJ SOLN
3.0000 mL | Freq: Two times a day (BID) | INTRAMUSCULAR | Status: DC
  Administered 2013-05-08 – 2013-05-11 (×2): 3 mL via INTRAVENOUS

## 2013-05-07 MED ORDER — DEXTROSE 5 % IV SOLN
1.0000 g | INTRAVENOUS | Status: DC
  Administered 2013-05-07 – 2013-05-09 (×3): 1 g via INTRAVENOUS
  Filled 2013-05-07 (×3): qty 10

## 2013-05-07 MED ORDER — LORATADINE 10 MG PO TABS
10.0000 mg | ORAL_TABLET | Freq: Every day | ORAL | Status: DC
  Administered 2013-05-07 – 2013-05-11 (×5): 10 mg via ORAL
  Filled 2013-05-07 (×6): qty 1

## 2013-05-07 MED ORDER — BIOTENE DRY MOUTH MT LIQD
15.0000 mL | Freq: Two times a day (BID) | OROMUCOSAL | Status: DC
  Administered 2013-05-07 – 2013-05-11 (×8): 15 mL via OROMUCOSAL

## 2013-05-07 NOTE — ED Notes (Signed)
Per EMS: Pt c/o cough, chills, body aches since yesterday.  Vomited once today.

## 2013-05-07 NOTE — ED Notes (Addendum)
Pt says she does not produce Urine. PA Rob is Aware

## 2013-05-07 NOTE — ED Notes (Signed)
Patient currently not in room, will perform EKG when she returns

## 2013-05-07 NOTE — ED Provider Notes (Signed)
CSN: [id number]     Arrival date & time 05/07/13  1001 History   First MD Initiated Contact with Patient 05/07/13 1005     Chief Complaint  Patient presents with  . Cough  . Chills  . Generalized Body Aches  . Nausea  . Emesis   (Consider location/radiation/quality/duration/timing/severity/associated sxs/prior Treatment) HPI Comments: Patient presents to the ED with a chief complaint of cough, chills, generalized myalgias, nausea, and one episode of vomiting.  The symptoms started yesterday.  She states that she has not been around any known sick contacts, but did go to church on Sunday.  She states that she did get a flu shot this year.  Nothing makes the symptoms better or worse.  Symptoms were sudden in onset and moderate in severity. Patient is a dialysis patient, she normally goes to dialysis on Tuesday, Thursday, Saturday, however this week is Wednesday and Friday because of the holiday.  The history is provided by the patient. No language interpreter was used.    Past Medical History  Diagnosis Date  . HTN (hypertension)   . ESRD on hemodialysis   . Mild aortic stenosis   . Dyslipidemia   . TIA (transient ischemic attack)   . Gout   . Benign bladder mass   . DJD (degenerative joint disease)   . Anemia   . Obstructive sleep apnea     mild  . Chronic cough    Past Surgical History  Procedure Laterality Date  . Nephrectomy      left  . Total abdominal hysterectomy    . Bone marrow biopsy    . Left arm dialysis graft     Family History  Problem Relation Age of Onset  . Hypertension Mother   . Diabetes Mother    History  Substance Use Topics  . Smoking status: Never Smoker   . Smokeless tobacco: Never Used  . Alcohol Use: No   OB History   Grav Para Term Preterm Abortions TAB SAB Ect Mult Living                 Review of Systems  All other systems reviewed and are negative.    Allergies  Sulfa antibiotics and Sulfonamide derivatives  Home Medications    Current Outpatient Rx  Name  Route  Sig  Dispense  Refill  . albuterol (PROAIR HFA) 108 (90 BASE) MCG/ACT inhaler      1-2 puffs every 4-6 hours as needed          . amLODipine (NORVASC) 10 MG tablet   Oral   Take 10 mg by mouth daily.         Marland Kitchen aspirin 81 MG tablet   Oral   Take 81 mg by mouth daily.           Marland Kitchen atorvastatin (LIPITOR) 10 MG tablet   Oral   Take 10 mg by mouth at bedtime.           . calcium acetate (PHOSLO) 667 MG capsule   Oral   Take 2,001 mg by mouth 3 (three) times daily with meals.         . fexofenadine (ALLEGRA) 180 MG tablet   Oral   Take 180 mg by mouth daily as needed.         . fluticasone (FLONASE) 50 MCG/ACT nasal spray   Nasal   Place 2 sprays into the nose daily.   16 g   2   . Multiple  Vitamin (MULTIVITAMIN) tablet   Oral   Take 1 tablet by mouth daily.           . SENSIPAR 30 MG tablet   Oral   Take 1 tablet by mouth daily.         . traMADol-acetaminophen (ULTRACET) 37.5-325 MG per tablet   Oral   Take 1 tablet by mouth 3 (three) times daily.          BP 157/69  Pulse 100  Temp(Src) 99.8 F (37.7 C) (Oral)  Resp 20  SpO2 97% Physical Exam  Nursing note and vitals reviewed. Constitutional: She is oriented to person, place, and time. She appears well-developed and well-nourished.  HENT:  Head: Normocephalic and atraumatic.  Eyes: Conjunctivae and EOM are normal. Pupils are equal, round, and reactive to light.  Neck: Normal range of motion. Neck supple.  Cardiovascular: Normal rate and regular rhythm.  Exam reveals no gallop and no friction rub.   No murmur heard. Pulmonary/Chest: Effort normal and breath sounds normal. No respiratory distress. She has no wheezes. She has no rales. She exhibits no tenderness.  Abdominal: Soft. Bowel sounds are normal. She exhibits no distension and no mass. There is no tenderness. There is no rebound and no guarding.  Musculoskeletal: Normal range of motion. She  exhibits no edema and no tenderness.  Neurological: She is alert and oriented to person, place, and time.  Skin: Skin is warm and dry.  Psychiatric: She has a normal mood and affect. Her behavior is normal. Judgment and thought content normal.    ED Course  Procedures (including critical care time) Labs Review Results for orders placed during the hospital encounter of 05/07/13  CBC WITH DIFFERENTIAL      Result Value Range   WBC 4.2  4.0 - 10.5 K/uL   RBC 2.68 (*) 3.87 - 5.11 MIL/uL   Hemoglobin 8.5 (*) 12.0 - 15.0 g/dL   HCT 26.3 (*) 36.0 - 46.0 %   MCV 98.1  78.0 - 100.0 fL   MCH 31.7  26.0 - 34.0 pg   MCHC 32.3  30.0 - 36.0 g/dL   RDW 14.4  11.5 - 15.5 %   Platelets 151  150 - 400 K/uL   Neutrophils Relative % 77  43 - 77 %   Neutro Abs 3.2  1.7 - 7.7 K/uL   Lymphocytes Relative 13  12 - 46 %   Lymphs Abs 0.5 (*) 0.7 - 4.0 K/uL   Monocytes Relative 9  3 - 12 %   Monocytes Absolute 0.4  0.1 - 1.0 K/uL   Eosinophils Relative 1  0 - 5 %   Eosinophils Absolute 0.0  0.0 - 0.7 K/uL   Basophils Relative 1  0 - 1 %   Basophils Absolute 0.0  0.0 - 0.1 K/uL  BASIC METABOLIC PANEL      Result Value Range   Sodium 137  137 - 147 mEq/L   Potassium 4.6  3.7 - 5.3 mEq/L   Chloride 96  96 - 112 mEq/L   CO2 27  19 - 32 mEq/L   Glucose, Bld 89  70 - 99 mg/dL   BUN 14  6 - 23 mg/dL   Creatinine, Ser 4.84 (*) 0.50 - 1.10 mg/dL   Calcium 10.3  8.4 - 10.5 mg/dL   GFR calc non Af Amer 7 (*) >90 mL/min   GFR calc Af Amer 8 (*) >90 mL/min  POCT I-STAT TROPONIN I  Result Value Range   Troponin i, poc 0.05  0.00 - 0.08 ng/mL   Comment 3            Dg Chest 2 View  05/07/2013   CLINICAL DATA:  Cough and shortness of breath for 2 days  EXAM: CHEST  2 VIEW  COMPARISON:  01/01/2013  FINDINGS: Interstitial coarsening, likely accentuated by low lung volumes. No evidence of consolidation, edema, effusion, or pneumothorax. Stable cardiomegaly. Tortuous and atherosclerotic aorta.  IMPRESSION: No  active cardiopulmonary disease.   Electronically Signed   By: Jorje Guild M.D.   On: 05/07/2013 10:59     EKG Interpretation    Date/Time:  Tuesday May 07 2013 11:22:41 EST Ventricular Rate:  93 PR Interval:  150 QRS Duration: 139 QT Interval:  361 QTC Calculation: 449 R Axis:   8 Text Interpretation:  Sinus tachycardia Multiple premature complexes, vent  Right bundle branch block Borderline ST elevation, lateral leads No significant change since last tracing Confirmed by WARD  DO, KRISTEN [id number]) on 05/07/2013 11:48:39 AM            MDM   1. Cough   2. Fever   3. ESRD (end stage renal disease)   4. Chronic cough     3:03 PM Patient with flu-like symptoms.  ESRD.  Patient seen by and discussed with Dr. Leonides Schanz.  Will admit to medicine.    Discussed the patient with Dr. Doyle Askew, who will admit the patient to [facility].   Montine Circle, PA-C 05/07/13 1504

## 2013-05-07 NOTE — ED Provider Notes (Signed)
Medical screening examination/treatment/procedure(s) were conducted as a shared visit with non-physician practitioner(s) and myself.  I personally evaluated the patient during the encounter.  EKG Interpretation    Date/Time:  Tuesday May 07 2013 11:22:41 EST Ventricular Rate:  93 PR Interval:  150 QRS Duration: 139 QT Interval:  361 QTC Calculation: 449 R Axis:   8 Text Interpretation:  Sinus tachycardia Multiple premature complexes, vent  Right bundle branch block Borderline ST elevation, lateral leads No significant change since last tracing Confirmed by Artisha Capri  DO, Chikita Dogan ([id number]) on 05/07/2013 11:48:39 AM            Patient is an 79 year old female with a history of hypertension, end-stage renal disease on hemodialysis, aortic stenosis, TIA who presents emergency department with cough, chills, shortness of breath with exertion, myalgias, fever that started yesterday. Here she is febrile and tachycardic but not hypoxic. Her labs are reassuring, chest x-ray negative. Influenza is pending. Patient reports she feels very poorly and would like admission to the hospital. Will discuss with hospitalist.  [place], DO 05/07/13 1623

## 2013-05-07 NOTE — ED Notes (Signed)
Bed: WA16 Expected date:  Expected time:  Means of arrival:  Comments: EMS 

## 2013-05-07 NOTE — ED Notes (Signed)
Patient states that she does not urinate at all.

## 2013-05-07 NOTE — Progress Notes (Signed)
Pt admitted to [place] from [facility] ED. Received report from [place], [place] at [facility] ED. Pt is alert and oriented to staff, call bell, and room. Patient placed in Droplet precautions, pending Flu PCR. Bed in lowest position. Call bell within reach. Full assessment to Epic. Will continue to monitor.

## 2013-05-07 NOTE — ED Notes (Signed)
O2 got down to 82 while ambulating, highest it got was 88.

## 2013-05-07 NOTE — H&P (Signed)
[facility] History and Physical  [name] [id number] DOB: [birth year] DOA: 05/07/2013  Referring physician: ED physician PCP: Criselda Peaches, MD   Chief Complaint: shortness of breath and cough  HPI:  Patient is 79 year old female with end-stage renal disease, hypertension, mild aortic stenosis, chronic cough who presents to [facility] emergency department with several days duration of progressively worsening shortness of breath mostly present with exertion but occasionally present at rest as well. She explains she has had dialysis 05/06/2013 and is not sure how much fluid was removed at that time. Her baseline weight is 150 252 pounds. She usually dialyzes TTS but due to the holidays she was changed to MWF schedule. She explains her shortness of breath has been associated with productive cough of yellow sputum, subjective fevers and chills, poor oral intake, malaise. She explains she has chronic cough for several years and has had workup done by Dr. Halford Chessman pulmonologist. She explains that her cough is a lot worse than her typical baseline. She denies any specific abdominal concerns, no specific neurological symptoms. She also denies recent sick contacts or exposures to her knowledge.  In emergency department, patient found to be hemodynamically stable with low-grade fever of 99.6, noted to be coughing and with productive yellow cough. [facility] asked to admit for further evaluation and management. As patient dialyzes MWF this week we'll transfer patient to call for further evaluation and management.  Assessment and Plan: Acute respiratory failure - Unclear etiology at this time, chest x-ray is relatively unremarkable and I cannot appreciate significant crackles or rhonchi is on exam - Will admit patient for further evaluation and management and will transfer to [facility] as patient requires dialysis - Influenza panel ordered in emergency department, patient  placed on empiric Tamiflu until results are available - Will also place on empiric antibiotic and obtain sputum analysis, Gram stain and culture, urine Legionella and strep pneumo - Provide supportive care with oxygen as needed, Mucinex for cough End stage renal disease - Nephrology team notified, they will see patient in the morning - Patient does not appear to be volume overloaded on exam - Electrolyte panel will be obtained in the morning Hypertension - Stable on admission, monitor vitals per floor protocol Anemia of chronic disease - Hemoglobin and hematocrit slightly below baseline, baseline appears to be around 9 - No signs of active bleeding, obtain CBC in the morning  Code Status: Full Family Communication: Pt and family at bedside Disposition Plan: Transfer to [facility] hospital for further evaluation   Review of Systems:  Constitutional: Negative for diaphoresis.  HENT: Negative for hearing loss, ear pain, nosebleeds, congestion, sore throat, neck pain, tinnitus and ear discharge.   Eyes: Negative for blurred vision, double vision, photophobia, pain, discharge and redness.  Respiratory: Negative for  wheezing and stridor.   Cardiovascular: Negative for chest pain, palpitations, orthopnea, claudication and leg swelling.  Gastrointestinal: Negative for nausea, vomiting and abdominal pain. Negative for heartburn, constipation, blood in stool and melena.  Genitourinary: Negative for dysuria, urgency, frequency, hematuria and flank pain.  Musculoskeletal: Negative for myalgias, back pain, joint pain and falls.  Skin: Negative for itching and rash.  Neurological: Negative for tingling, tremors, sensory change, speech change, focal weakness, loss of consciousness and headaches.  Endo/Heme/Allergies: Negative for environmental allergies and polydipsia. Does not bruise/bleed easily.  Psychiatric/Behavioral: Negative for suicidal ideas. The patient is not nervous/anxious.      Past  Medical History  Diagnosis Date  . HTN (hypertension)   .  ESRD on hemodialysis   . Mild aortic stenosis   . Dyslipidemia   . TIA (transient ischemic attack)   . Gout   . Benign bladder mass   . DJD (degenerative joint disease)   . Anemia   . Obstructive sleep apnea     mild  . Chronic cough     Past Surgical History  Procedure Laterality Date  . Nephrectomy      left  . Total abdominal hysterectomy    . Bone marrow biopsy    . Left arm dialysis graft      Social History:  reports that she has never smoked. She has never used smokeless tobacco. She reports that she does not drink alcohol. Her drug history is not on file.  Allergies  Allergen Reactions  . Sulfa Antibiotics   . Sulfonamide Derivatives     Family History  Problem Relation Age of Onset  . Hypertension Mother   . Diabetes Mother     Prior to Admission medications   Medication Sig Start Date End Date Taking? Authorizing Provider  acetaminophen (TYLENOL) 500 MG tablet Take 500 mg by mouth every 6 (six) hours as needed (pain).   Yes Historical Provider, MD  albuterol (PROAIR HFA) 108 (90 BASE) MCG/ACT inhaler 1-2 puffs every 4-6 hours as needed    Yes Historical Provider, MD  amLODipine (NORVASC) 10 MG tablet Take 10 mg by mouth daily.   Yes Historical Provider, MD  aspirin 81 MG tablet Take 81 mg by mouth daily.     Yes Historical Provider, MD  atorvastatin (LIPITOR) 10 MG tablet Take 10 mg by mouth at bedtime.     Yes Historical Provider, MD  calcium acetate (PHOSLO) 667 MG capsule Take 2,001 mg by mouth 3 (three) times daily with meals.   Yes Historical Provider, MD  ciprofloxacin (CIPRO) 500 MG tablet Take 500 mg by mouth 2 (two) times daily. Pt unsure of why taking 04/23/13  Yes Historical Provider, MD  Multiple Vitamin (MULTIVITAMIN) tablet Take 1 tablet by mouth daily.     Yes Historical Provider, MD  SENSIPAR 30 MG tablet Take 1 tablet by mouth daily. 02/14/13  Yes Historical Provider, MD   traMADol-acetaminophen (ULTRACET) 37.5-325 MG per tablet Take 1 tablet by mouth 3 (three) times daily. 02/14/13  Yes Historical Provider, MD    Physical Exam: Filed Vitals:   05/07/13 1200 05/07/13 1331 05/07/13 1345 05/07/13 1349  BP:   134/45   Pulse:   85   Temp:    99.6 F (37.6 C)  TempSrc:    Rectal  Resp: 20 19 21    SpO2:   92%     Physical Exam  Constitutional: Appears well-developed and well-nourished. No distress.  HENT: Normocephalic. External right and left ear normal. Oropharynx is clear and moist.  Eyes: Conjunctivae and EOM are normal. PERRLA, no scleral icterus.  Neck: Normal ROM. Neck supple. No JVD. No tracheal deviation. No thyromegaly.  CVS: RRR, S1/S2 +, no murmurs, no gallops, no carotid bruit.  Pulmonary: Effort and breath sounds normal, no stridor, rhonchi, wheezes, rales.  Abdominal: Soft. BS +,  no distension, tenderness, rebound or guarding.  Musculoskeletal: Normal range of motion. No edema and no tenderness.  Lymphadenopathy: No lymphadenopathy noted, cervical, inguinal. Neuro: Alert. Normal reflexes, muscle tone coordination. No cranial nerve deficit. Skin: Skin is warm and dry. No rash noted. Not diaphoretic. No erythema. No pallor.  Psychiatric: Normal mood and affect. Behavior, judgment, thought content normal.   Labs  on Admission:  Basic Metabolic Panel:  Recent Labs Lab 05/07/13 1116  NA 137  K 4.6  CL 96  CO2 27  GLUCOSE 89  BUN 14  CREATININE 4.84*  CALCIUM 10.3   CBC:  Recent Labs Lab 05/07/13 1116  WBC 4.2  NEUTROABS 3.2  HGB 8.5*  HCT 26.3*  MCV 98.1  PLT 151   Radiological Exams on Admission: Dg Chest 2 View   05/07/2013  No active cardiopulmonary disease.     EKG: Normal sinus rhythm, no ST/T wave changes  Faye Ramsay, MD  [facility] Pager [phone]  If 7PM-7AM, please contact night-coverage [email or web address] Password [email or web address] 05/07/2013, 2:48 PM

## 2013-05-08 DIAGNOSIS — R05 Cough: Secondary | ICD-10-CM | POA: Diagnosis present

## 2013-05-08 DIAGNOSIS — J111 Influenza due to unidentified influenza virus with other respiratory manifestations: Principal | ICD-10-CM

## 2013-05-08 DIAGNOSIS — R509 Fever, unspecified: Secondary | ICD-10-CM

## 2013-05-08 DIAGNOSIS — J101 Influenza due to other identified influenza virus with other respiratory manifestations: Secondary | ICD-10-CM | POA: Diagnosis present

## 2013-05-08 LAB — BASIC METABOLIC PANEL
BUN: 25 mg/dL — ABNORMAL HIGH (ref 6–23)
CO2: 25 mEq/L (ref 19–32)
Chloride: 101 mEq/L (ref 96–112)
Creatinine, Ser: 6.38 mg/dL — ABNORMAL HIGH (ref 0.50–1.10)
GFR calc non Af Amer: 5 mL/min — ABNORMAL LOW (ref >90)
Glucose, Bld: 78 mg/dL (ref 70–99)
Potassium: 5.2 mEq/L (ref 3.7–5.3)

## 2013-05-08 LAB — CBC
HCT: 25.3 % — ABNORMAL LOW (ref 36.0–46.0)
Hemoglobin: 8.2 g/dL — ABNORMAL LOW (ref 12.0–15.0)
MCHC: 32.4 g/dL (ref 30.0–36.0)
MCV: 99.6 fL (ref 78.0–100.0)
Platelets: 139 10*3/uL — ABNORMAL LOW (ref 150–400)
RDW: 14.6 % (ref 11.5–15.5)

## 2013-05-08 MED ORDER — DOXERCALCIFEROL 4 MCG/2ML IV SOLN
2.0000 ug | INTRAVENOUS | Status: DC
  Administered 2013-05-08: 2 ug via INTRAVENOUS
  Filled 2013-05-08 (×2): qty 2

## 2013-05-08 MED ORDER — HEPARIN SODIUM (PORCINE) 1000 UNIT/ML IJ SOLN
1300.0000 [IU] | Freq: Once | INTRAMUSCULAR | Status: AC
  Administered 2013-05-08: 1300 [IU] via INTRAVENOUS

## 2013-05-08 MED ORDER — DARBEPOETIN ALFA-POLYSORBATE 60 MCG/0.3ML IJ SOLN
60.0000 ug | INTRAMUSCULAR | Status: DC
  Administered 2013-05-08: 60 ug via INTRAVENOUS
  Filled 2013-05-08: qty 0.3

## 2013-05-08 NOTE — H&P (Signed)
[facility] PROGRESS NOTE  [name] [id number] DOB: [birth year] DOA: 05/07/2013 PCP: Criselda Peaches, MD  HPI/Subjective: Feels much better less shortness of breath and cough.  Assessment/Plan: Active Problems:   ESRD (end stage renal disease)   Influenza A with respiratory manifestations -Patient presented with shortness of breath, cough and generalized body aches. -She is positive for influenza type A, started on Tamiflu, adjusted to her renal function.  End stage renal disease -Does not seem to have acute fluid overload. -Nephrology consulted for routine dialysis.  Hypertension -Continue blood pressure control.  Code Status: Full code Family Communication: Plan discussed with the patient. Disposition Plan: Remains inpatient   Consultants:  None  Procedures:  None  Antibiotics:  None   Objective: Filed Vitals:   05/08/13 0949  BP: 131/45  Pulse: 83  Temp: 98.7 F (37.1 C)  Resp: 16    Intake/Output Summary (Last 24 hours) at 05/08/13 1411 Last data filed at 05/08/13 0900  Gross per 24 hour  Intake    538 ml  Output      0 ml  Net    538 ml   Filed Weights   05/07/13 1641 05/07/13 2147  Weight: 66.5 kg (146 lb 9.7 oz) 66.5 kg (146 lb 9.7 oz)    Exam: General: Alert and awake, oriented x3, not in any acute distress. HEENT: anicteric sclera, pupils reactive to light and accommodation, EOMI CVS: S1-S2 clear, no murmur rubs or gallops Chest: clear to auscultation bilaterally, no wheezing, rales or rhonchi Abdomen: soft nontender, nondistended, normal bowel sounds, no organomegaly Extremities: no cyanosis, clubbing or edema noted bilaterally Neuro: Cranial nerves II-XII intact, no focal neurological deficits  Data Reviewed: Basic Metabolic Panel:  Recent Labs Lab 05/07/13 1116 05/08/13 0553  NA 137 140  K 4.6 5.2  CL 96 101  CO2 27 25  GLUCOSE 89 78  BUN 14 25*  CREATININE 4.84* 6.38*  CALCIUM 10.3 9.7   Liver  Function Tests: No results found for this basename: AST, ALT, ALKPHOS, BILITOT, PROT, ALBUMIN,  in the last 168 hours No results found for this basename: LIPASE, AMYLASE,  in the last 168 hours No results found for this basename: AMMONIA,  in the last 168 hours CBC:  Recent Labs Lab 05/07/13 1116 05/08/13 0553  WBC 4.2 3.2*  NEUTROABS 3.2  --   HGB 8.5* 8.2*  HCT 26.3* 25.3*  MCV 98.1 99.6  PLT 151 139*   Cardiac Enzymes: No results found for this basename: CKTOTAL, CKMB, CKMBINDEX, TROPONINI,  in the last 168 hours BNP (last 3 results) No results found for this basename: PROBNP,  in the last 8760 hours CBG:  Recent Labs Lab 05/07/13 2150  GLUCAP 82    Micro Recent Results (from the past 240 hour(s))  CULTURE, BLOOD (ROUTINE X 2)     Status: None   Collection Time    05/07/13 12:25 PM      Result Value Range Status   Specimen Description BLOOD LEFT WRIST   Final   Special Requests BOTTLES DRAWN AEROBIC ONLY 4ML   Final   Culture  Setup Time     Final   Value: 05/07/2013 14:36     Performed at [facility]   Culture     Final   Value:        BLOOD CULTURE RECEIVED NO GROWTH TO DATE CULTURE WILL BE HELD FOR 5 DAYS BEFORE ISSUING A FINAL NEGATIVE REPORT     Performed at  [facility]   Report Status PENDING   Incomplete  CULTURE, BLOOD (ROUTINE X 2)     Status: None   Collection Time    05/07/13 12:30 PM      Result Value Range Status   Specimen Description BLOOD LEFT HAND   Final   Special Requests BOTTLES DRAWN AEROBIC AND ANAEROBIC 3ML EA   Final   Culture  Setup Time     Final   Value: 05/07/2013 14:36     Performed at [facility]   Culture     Final   Value:        BLOOD CULTURE RECEIVED NO GROWTH TO DATE CULTURE WILL BE HELD FOR 5 DAYS BEFORE ISSUING A FINAL NEGATIVE REPORT     Performed at [facility]   Report Status PENDING   Incomplete     Studies: Dg Chest 2 View  05/07/2013   CLINICAL DATA:  Cough and shortness of  breath for 2 days  EXAM: CHEST  2 VIEW  COMPARISON:  01/01/2013  FINDINGS: Interstitial coarsening, likely accentuated by low lung volumes. No evidence of consolidation, edema, effusion, or pneumothorax. Stable cardiomegaly. Tortuous and atherosclerotic aorta.  IMPRESSION: No active cardiopulmonary disease.   Electronically Signed   By: Jorje Guild M.D.   On: 05/07/2013 10:59    Scheduled Meds: . amLODipine  10 mg Oral Daily  . antiseptic oral rinse  15 mL Mouth Rinse BID  . aspirin  81 mg Oral Daily  . atorvastatin  10 mg Oral QHS  . azithromycin  500 mg Oral Q24H  . calcium acetate  2,001 mg Oral TID WC  . cefTRIAXone (ROCEPHIN)  IV  1 g Intravenous Q24H  . cinacalcet  30 mg Oral Q breakfast  . darbepoetin (ARANESP) injection - DIALYSIS  60 mcg Intravenous Q Wed-HD  . doxercalciferol  2 mcg Intravenous Q M,W,F-HD  . heparin  5,000 Units Subcutaneous Q8H  . loratadine  10 mg Oral Daily  . oseltamivir  30 mg Oral Q M,W,F-HD  . sodium chloride  3 mL Intravenous Q12H  . sodium chloride  3 mL Intravenous Q12H  . traMADol-acetaminophen  1 tablet Oral TID   Continuous Infusions:      Time spent: 35 minutes    [facility]  [facility] Pager [phone] If 7PM-7AM, please contact night-coverage at [email or web address], password [facility] 05/08/2013, 2:11 PM  LOS: 1 day

## 2013-05-08 NOTE — Progress Notes (Signed)
Follow-up:  I was ask to see pt s/p transfer from [facility] to room 6E-03. [name] is an 79 y/o female with end-stage renal disease, hypertension, mild aortic stenosis, chronic cough who presented to [facility] emergency department with c/o several days duration of progressively worsening SOB w/ exertion and at rest as well. Associated sx's include cough, chills, bodyaches, n/v. Pt gets HD Tues/Thurs & Sat. But this week she rec'd HD on Wed and Friday because of the holiday. Pt admitted for further evaluation. At bedside pt noted resting with eyes closed in NAD. VSS. Cardiac telemtery reveals NSR w/ rate of 72. Pt has voiced no c/o's since her arrival. Will continue to monitor closely.  Jeryl Columbia, NP-C [facility] Pager [phone]

## 2013-05-08 NOTE — Procedures (Signed)
I was present at this dialysis session, have reviewed the session itself and made  appropriate changes  Kelly Splinter MD (pgr) [phone]    ([phone] 05/08/2013, 4:45 PM

## 2013-05-08 NOTE — Consult Note (Signed)
[facility] Renal Consultation Note  Indication for Consultation:  Management of ESRD/hemodialysis; anemia, hypertension/volume and secondary hyperparathyroidism  HPI: [name] is a 79 y.o. female admitted Influenza A with several days duration of progressively worsening shortness of breath mostly present with exertion but occasionally present at rest as well.  she has had dialysis 05/06/2013 and  Starting having symptoms later that evening with body aches and coughing with yellow sputum, and malaise. She usually dialyzes TTS but due to the holidays she was changed to [facility] schedule this week. She reports seeing her PC MD recently for lower abdominal discomfort with diagnosis of "diverticulitis and started on Cipro and liquids progressing to solids" and no further abdominal discomfort  Now this am feeling better overall with no SOB and tolerated some Breakfast.   Past Medical History  Diagnosis Date  . HTN (hypertension)   . ESRD on hemodialysis   . Mild aortic stenosis   . Dyslipidemia   . TIA (transient ischemic attack)   . Gout   . Benign bladder mass   . DJD (degenerative joint disease)   . Anemia   . Obstructive sleep apnea     mild  . Chronic cough     Past Surgical History  Procedure Laterality Date  . Nephrectomy      left  . Total abdominal hysterectomy    . Bone marrow biopsy    . Left arm dialysis graft        Family History  Problem Relation Age of Onset  . Hypertension Mother   . Diabetes Mother   Social = Lives alone with One daughter out of town and    reports that she has never smoked. She has never used smokeless tobacco. She reports that she does not drink alcohol or use illicit drugs.   Allergies  Allergen Reactions  . Sulfa Antibiotics   . Sulfonamide Derivatives     Prior to Admission medications   Medication Sig Start Date End Date Taking? Authorizing Provider  acetaminophen (TYLENOL) 500 MG tablet Take 500 mg by mouth  every 6 (six) hours as needed (pain).   Yes Historical Provider, MD  albuterol (PROAIR HFA) 108 (90 BASE) MCG/ACT inhaler 1-2 puffs every 4-6 hours as needed    Yes Historical Provider, MD  amLODipine (NORVASC) 10 MG tablet Take 10 mg by mouth daily.   Yes Historical Provider, MD  aspirin 81 MG tablet Take 81 mg by mouth daily.     Yes Historical Provider, MD  atorvastatin (LIPITOR) 10 MG tablet Take 10 mg by mouth at bedtime.     Yes Historical Provider, MD  calcium acetate (PHOSLO) 667 MG capsule Take 2,001 mg by mouth 3 (three) times daily with meals.   Yes Historical Provider, MD  ciprofloxacin (CIPRO) 500 MG tablet Take 500 mg by mouth 2 (two) times daily. Pt unsure of why taking 04/23/13  Yes Historical Provider, MD  Multiple Vitamin (MULTIVITAMIN) tablet Take 1 tablet by mouth daily.     Yes Historical Provider, MD  SENSIPAR 30 MG tablet Take 1 tablet by mouth daily. 02/14/13  Yes Historical Provider, MD  traMADol-acetaminophen (ULTRACET) 37.5-325 MG per tablet Take 1 tablet by mouth 3 (three) times daily. 02/14/13  Yes Historical Provider, MD    Continuous:   Results for orders placed during the hospital encounter of 05/07/13 (from the past 48 hour(s))  CBC WITH DIFFERENTIAL     Status: Abnormal   Collection Time    05/07/13 11:16  AM      Result Value Range   WBC 4.2  4.0 - 10.5 K/uL   RBC 2.68 (*) 3.87 - 5.11 MIL/uL   Hemoglobin 8.5 (*) 12.0 - 15.0 g/dL   HCT 26.3 (*) 36.0 - 46.0 %   MCV 98.1  78.0 - 100.0 fL   MCH 31.7  26.0 - 34.0 pg   MCHC 32.3  30.0 - 36.0 g/dL   RDW 14.4  11.5 - 15.5 %   Platelets 151  150 - 400 K/uL   Neutrophils Relative % 77  43 - 77 %   Neutro Abs 3.2  1.7 - 7.7 K/uL   Lymphocytes Relative 13  12 - 46 %   Lymphs Abs 0.5 (*) 0.7 - 4.0 K/uL   Monocytes Relative 9  3 - 12 %   Monocytes Absolute 0.4  0.1 - 1.0 K/uL   Eosinophils Relative 1  0 - 5 %   Eosinophils Absolute 0.0  0.0 - 0.7 K/uL   Basophils Relative 1  0 - 1 %   Basophils Absolute 0.0  0.0  - 0.1 K/uL  BASIC METABOLIC PANEL     Status: Abnormal   Collection Time    05/07/13 11:16 AM      Result Value Range   Sodium 137  137 - 147 mEq/L   Comment: Please note change in reference range.   Potassium 4.6  3.7 - 5.3 mEq/L   Comment: Please note change in reference range.   Chloride 96  96 - 112 mEq/L   CO2 27  19 - 32 mEq/L   Glucose, Bld 89  70 - 99 mg/dL   BUN 14  6 - 23 mg/dL   Creatinine, Ser 4.84 (*) 0.50 - 1.10 mg/dL   Calcium 10.3  8.4 - 10.5 mg/dL   GFR calc non Af Amer 7 (*) >90 mL/min   GFR calc Af Amer 8 (*) >90 mL/min   Comment: (NOTE)     The eGFR has been calculated using the CKD EPI equation.     This calculation has not been validated in all clinical situations.     eGFR's persistently <90 mL/min signify possible Chronic Kidney     Disease.  POCT I-STAT TROPONIN I     Status: None   Collection Time    05/07/13 11:31 AM      Result Value Range   Troponin i, poc 0.05  0.00 - 0.08 ng/mL   Comment 3            Comment: Due to the release kinetics of cTnI,     a negative result within the first hours     of the onset of symptoms does not rule out     myocardial infarction with certainty.     If myocardial infarction is still suspected,     repeat the test at appropriate intervals.  CULTURE, BLOOD (ROUTINE X 2)     Status: None   Collection Time    05/07/13 12:25 PM      Result Value Range   Specimen Description BLOOD LEFT WRIST     Special Requests BOTTLES DRAWN AEROBIC ONLY 4ML     Culture  Setup Time       Value: 05/07/2013 14:36     Performed at [facility]   Culture       Value:        BLOOD CULTURE RECEIVED NO GROWTH TO DATE CULTURE WILL BE HELD FOR  5 DAYS BEFORE ISSUING A FINAL NEGATIVE REPORT     Performed at [facility]   Report Status PENDING    CULTURE, BLOOD (ROUTINE X 2)     Status: None   Collection Time    05/07/13 12:30 PM      Result Value Range   Specimen Description BLOOD LEFT HAND     Special Requests BOTTLES  DRAWN AEROBIC AND ANAEROBIC 3ML EA     Culture  Setup Time       Value: 05/07/2013 14:36     Performed at [facility]   Culture       Value:        BLOOD CULTURE RECEIVED NO GROWTH TO DATE CULTURE WILL BE HELD FOR 5 DAYS BEFORE ISSUING A FINAL NEGATIVE REPORT     Performed at [facility]   Report Status PENDING    INFLUENZA PANEL BY PCR     Status: Abnormal   Collection Time    05/07/13 12:32 PM      Result Value Range   Influenza A By PCR POSITIVE (*) NEGATIVE   Influenza B By PCR NEGATIVE  NEGATIVE   H1N1 flu by pcr NOT DETECTED  NOT DETECTED   Comment:            The Xpert Flu assay (FDA approved for     nasal aspirates or washes and     nasopharyngeal swab specimens), is     intended as an aid in the diagnosis of     influenza and should not be used as     a sole basis for treatment.     Performed at [place], [facility]     Status: None   Collection Time    05/07/13  9:50 PM      Result Value Range   Glucose-Capillary 82  70 - 99 mg/dL  BASIC METABOLIC PANEL     Status: Abnormal   Collection Time    05/08/13  5:53 AM      Result Value Range   Sodium 140  137 - 147 mEq/L   Comment: Please note change in reference range.   Potassium 5.2  3.7 - 5.3 mEq/L   Comment: Please note change in reference range.   Chloride 101  96 - 112 mEq/L   CO2 25  19 - 32 mEq/L   Glucose, Bld 78  70 - 99 mg/dL   BUN 25 (*) 6 - 23 mg/dL   Creatinine, Ser 6.38 (*) 0.50 - 1.10 mg/dL   Calcium 9.7  8.4 - 10.5 mg/dL   GFR calc non Af Amer 5 (*) >90 mL/min   GFR calc Af Amer 6 (*) >90 mL/min   Comment: (NOTE)     The eGFR has been calculated using the CKD EPI equation.     This calculation has not been validated in all clinical situations.     eGFR's persistently <90 mL/min signify possible Chronic Kidney     Disease.  CBC     Status: Abnormal   Collection Time    05/08/13  5:53 AM      Result Value Range   WBC 3.2 (*) 4.0 - 10.5 K/uL   RBC 2.54 (*)  3.87 - 5.11 MIL/uL   Hemoglobin 8.2 (*) 12.0 - 15.0 g/dL   HCT 25.3 (*) 36.0 - 46.0 %   MCV 99.6  78.0 - 100.0 fL   MCH 32.3  26.0 - 34.0  pg   MCHC 32.4  30.0 - 36.0 g/dL   RDW 14.6  11.5 - 15.5 %   Platelets 139 (*) 150 - 400 K/uL    ROS: see positives on HPI  Physical Exam: Filed Vitals:   05/08/13 0525  BP: 109/50  Pulse: 76  Temp: 98.7 F (37.1 C)  Resp: 20     General:Alert Elderly BF, NAD , Pleasant , appropriate HEENT: [place], MMM Eyes: eomi Neck:  Supple, no jvd Heart: RRR, soft 1/6 sem lsb. No rub Lungs: soft LLL rales otherwise cta Abdomen: bs positive, soft, non tender, non distended Extremities: Trace bipedal edema Skin: no overt rash or ulcers Neuro: alert Ox3 , no acute deficits noted Dialysis Access: pos. Bruit R Arm avgg   Dialysis Orders: Center: [facility]  on TTS . EDW 66.5 HD Bath 2.0 k, 2.0 ca  Time  3hr 54min Heparin 4000 units. Access  R A AVF BFR 400  DFR af 1.5     Hectorol 4 mcg IV/HD Epogen 1,0000   Units IV/HD  Venofer  0  Other  OP LAbs  hgb 9.7 12/22 and 46 % tsat. pth 98< 223< 804 ( hectorol  Will be decr. 4to 2) alb 4.1  cva 10.6 phos 4.9   Assessment/Plan 1. Influenza A (positive on admit labs) = tx per admit team 2. ESRD -  HD today (on TTS  [facility] schedule) with holiday schedule=  MW SAT 3. Hypertension/volume  - bp stable to low ( have been slowly tapering edw [facility] for some pedal edema) attempt 1.5 to 2 l today / hold amlodipine for low bp  Agree 4. Anemia  -  ESA and no iron 5. Metabolic bone disease -  Phoslo binder, taper vit d from op level with pth decr. 6. Nutrition - renal diet, renal vitamin  Ernest Haber, PA-C [place] [phone] 05/08/2013, 9:28 AM   I have seen and examined patient, discussed with PA and agree with assessment and plan as outlined above with additions as indicated. Kelly Splinter MD pager [phone]    cell [phone] 05/08/2013, 12:51 PM

## 2013-05-09 DIAGNOSIS — N2581 Secondary hyperparathyroidism of renal origin: Secondary | ICD-10-CM

## 2013-05-09 MED ORDER — OSELTAMIVIR PHOSPHATE 30 MG PO CAPS
30.0000 mg | ORAL_CAPSULE | ORAL | Status: DC
  Administered 2013-05-11: 30 mg via ORAL
  Filled 2013-05-09 (×2): qty 1

## 2013-05-09 NOTE — H&P (Signed)
[facility] PROGRESS NOTE  MAEVEN LAWE [id number] DOB: Aug 03, 1921 DOA: 05/07/2013 PCP: Criselda Peaches, MD  HPI/Subjective: Feels much better less shortness of breath and cough.  Assessment/Plan: Principal Problem:   Influenza A with respiratory manifestations Active Problems:   ESRD (end stage renal disease)   Cough  anemia: Secondary to chronic renal disease. On Aranesp.  Influenza A with respiratory manifestations -Patient presented with shortness of breath, cough and generalized body aches. -She is positive for influenza type A, started on Tamiflu, adjusted to her renal function. Stopped IV antibiotics, no evidence of pneumonia  End stage renal disease -Does not seem to have acute fluid overload. -Nephrology consulted for routine dialysis. Next dialysis session for Saturday 1/3  Hypertension -Continue blood pressure control. Stable.  Code Status: Full code Family Communication: Plan discussed with the patient. Disposition Plan: Improving. Start ambulating. Home in the next one to 2 days   Consultants:  None  Procedures:  None  Antibiotics:  IV Rocephin/Zithromax 12/30-1/1   Objective: Filed Vitals:   05/09/13 1340  BP: 123/48  Pulse: 83  Temp: 98.4 F (36.9 C)  Resp: 18    Intake/Output Summary (Last 24 hours) at 05/09/13 1738 Last data filed at 05/09/13 1400  Gross per 24 hour  Intake    600 ml  Output   1500 ml  Net   -900 ml   Filed Weights   05/08/13 1455 05/08/13 1911 05/08/13 2103  Weight: 66.9 kg (147 lb 7.8 oz) 65 kg (143 lb 4.8 oz) 65.182 kg (143 lb 11.2 oz)    Exam: General: Alert and awake, oriented x3, not in any acute distress. CVS: S1-S2 clear, no murmur rubs or gallops Chest: clear to auscultation bilaterally, no wheezing, rales or rhonchi Abdomen: soft nontender, nondistended, normal bowel sounds, no organomegaly Extremities: no cyanosis, clubbing or edema noted bilaterally   Data Reviewed: Basic Metabolic  Panel:  Recent Labs Lab 05/07/13 1116 05/08/13 0553  NA 137 140  K 4.6 5.2  CL 96 101  CO2 27 25  GLUCOSE 89 78  BUN 14 25*  CREATININE 4.84* 6.38*  CALCIUM 10.3 9.7   Liver Function Tests: No results found for this basename: AST, ALT, ALKPHOS, BILITOT, PROT, ALBUMIN,  in the last 168 hours No results found for this basename: LIPASE, AMYLASE,  in the last 168 hours No results found for this basename: AMMONIA,  in the last 168 hours CBC:  Recent Labs Lab 05/07/13 1116 05/08/13 0553  WBC 4.2 3.2*  NEUTROABS 3.2  --   HGB 8.5* 8.2*  HCT 26.3* 25.3*  MCV 98.1 99.6  PLT 151 139*   Cardiac Enzymes: No results found for this basename: CKTOTAL, CKMB, CKMBINDEX, TROPONINI,  in the last 168 hours BNP (last 3 results) No results found for this basename: PROBNP,  in the last 8760 hours CBG:  Recent Labs Lab 05/07/13 2150  GLUCAP 82    Micro Recent Results (from the past 240 hour(s))  CULTURE, BLOOD (ROUTINE X 2)     Status: None   Collection Time    05/07/13 12:25 PM      Result Value Range Status   Specimen Description BLOOD LEFT WRIST   Final   Special Requests BOTTLES DRAWN AEROBIC ONLY 4ML   Final   Culture  Setup Time     Final   Value: 05/07/2013 14:36     Performed at [facility]   Culture     Final   Value:  BLOOD CULTURE RECEIVED NO GROWTH TO DATE CULTURE WILL BE HELD FOR 5 DAYS BEFORE ISSUING A FINAL NEGATIVE REPORT     Performed at [facility]   Report Status PENDING   Incomplete  CULTURE, BLOOD (ROUTINE X 2)     Status: None   Collection Time    05/07/13 12:30 PM      Result Value Range Status   Specimen Description BLOOD LEFT HAND   Final   Special Requests BOTTLES DRAWN AEROBIC AND ANAEROBIC 3ML EA   Final   Culture  Setup Time     Final   Value: 05/07/2013 14:36     Performed at [facility]   Culture     Final   Value:        BLOOD CULTURE RECEIVED NO GROWTH TO DATE CULTURE WILL BE HELD FOR 5 DAYS BEFORE ISSUING  A FINAL NEGATIVE REPORT     Performed at [facility]   Report Status PENDING   Incomplete     Studies: No results found.  Scheduled Meds: . amLODipine  10 mg Oral Daily  . antiseptic oral rinse  15 mL Mouth Rinse BID  . aspirin  81 mg Oral Daily  . atorvastatin  10 mg Oral QHS  . calcium acetate  2,001 mg Oral TID WC  . cinacalcet  30 mg Oral Q breakfast  . darbepoetin (ARANESP) injection - DIALYSIS  60 mcg Intravenous Q Wed-HD  . doxercalciferol  2 mcg Intravenous Q M,W,F-HD  . heparin  5,000 Units Subcutaneous Q8H  . loratadine  10 mg Oral Daily  . [START ON 05/11/2013] oseltamivir  30 mg Oral Q T,Th,Sa-HD  . sodium chloride  3 mL Intravenous Q12H  . sodium chloride  3 mL Intravenous Q12H  . traMADol-acetaminophen  1 tablet Oral TID   Continuous Infusions:      Time spent: 20 minutes    [facility] Pager [phone] If 7PM-7AM, please contact night-coverage at [email or web address], password [facility] 05/09/2013, 5:38 PM  LOS: 2 days

## 2013-05-09 NOTE — Progress Notes (Signed)
Subjective:  Co nasea , tolerated hd yesterday on schedule Objective Vital signs in last 24 hours: Filed Vitals:   05/08/13 1900 05/08/13 1911 05/08/13 2103 05/09/13 0959  BP: 120/49 134/66 139/60 123/49  Pulse: 84 75 88 67  Temp:  98.6 F (37 C) 99.8 F (37.7 C) 98.7 F (37.1 C)  TempSrc:  Oral Oral Oral  Resp: 17 18 18 18   Height:      Weight:  65 kg (143 lb 4.8 oz) 65.182 kg (143 lb 11.2 oz)   SpO2:  99% 95% 93%   Weight change: 0.4 kg (14.1 oz)  Intake/Output Summary (Last 24 hours) at 05/09/13 1207 Last data filed at 05/09/13 0845  Gross per 24 hour  Intake    360 ml  Output   1500 ml  Net  -1140 ml   Labs: Basic Metabolic Panel:  Recent Labs Lab 05/07/13 1116 05/08/13 0553  NA 137 140  K 4.6 5.2  CL 96 101  CO2 27 25  GLUCOSE 89 78  BUN 14 25*  CREATININE 4.84* 6.38*  CALCIUM 10.3 9.7   CBC:  Recent Labs Lab 05/07/13 1116 05/08/13 0553  WBC 4.2 3.2*  NEUTROABS 3.2  --   HGB 8.5* 8.2*  HCT 26.3* 25.3*  MCV 98.1 99.6  PLT 151 139*    Recent Labs Lab 05/07/13 2150  GLUCAP 82   Medications:   . amLODipine  10 mg Oral Daily  . antiseptic oral rinse  15 mL Mouth Rinse BID  . aspirin  81 mg Oral Daily  . atorvastatin  10 mg Oral QHS  . azithromycin  500 mg Oral Q24H  . calcium acetate  2,001 mg Oral TID WC  . cefTRIAXone (ROCEPHIN)  IV  1 g Intravenous Q24H  . cinacalcet  30 mg Oral Q breakfast  . darbepoetin (ARANESP) injection - DIALYSIS  60 mcg Intravenous Q Wed-HD  . doxercalciferol  2 mcg Intravenous Q M,W,F-HD  . heparin  5,000 Units Subcutaneous Q8H  . loratadine  10 mg Oral Daily  . oseltamivir  30 mg Oral Q M,W,F-HD  . sodium chloride  3 mL Intravenous Q12H  . sodium chloride  3 mL Intravenous Q12H  . traMADol-acetaminophen  1 tablet Oral TID   Physical Exam:  General:Alert Elderly BF, NAD uncomfortable co nausea, appropriate  Heart: RRR, soft 1/6 sem lsb. No rub  Lungs:  cta  Abdomen: bs positive, soft, slightly tender  epigastric area, non distended  Extremities: Trace bipedal edema  Skin: no overt rash or ulcers   Dialysis Access: pos. Bruit R Arm avgg   Dialysis Orders: Center: [facility] on TTS .  EDW 66.5 HD Bath 2.0 k, 2.0 ca Time 3hr 35min Heparin 4000 units. Access R A AVF BFR 400 DFR af 1.5  Hectorol 4 mcg IV/HD Epogen 1,0000 Units IV/HD Venofer 0  Other OP LAbs hgb 9.7 12/22 and 46 % tsat. pth 98< 223< 804 ( hectorol Will be decr. 4to 2) alb 4.1 cva 10.6 phos 4.9   Assessment/Plan  1. Influenza A (positive on admit labs) = tx per admit team On Rocephin and zithromax 2. ESRD - HD today (on TTS [facility] schedule) with holiday schedule= [facility] SAT/ next hd sat   3. Hypertension/volume - bp stable to low/ below edw yesterday post hd/ ( have been slowly tapering edw at [facility])/ hold amlodipine for low bp  4. Anemia - ESA and no iron/ inr esa to 100 hgb 8.2 5. Metabolic bone disease -  Phoslo binder, taper vit d from op level with pth decr. 6. Nutrition - renal diet, renal vitamin   Ernest Haber, PA-C [place] [phone] 05/09/2013,12:07 PM  LOS: 2 days   I have seen and examined patient, discussed with PA and agree with assessment and plan as outlined above with additions as indicated. Kelly Splinter MD pager [phone]    cell [phone] 05/09/2013, 1:48 PM

## 2013-05-09 NOTE — Progress Notes (Signed)
Tele d/c'd, box 6e03 removed and returned. CMT notified. Will continue to monitor. Offered to ambulate patient, pt refused at this time.

## 2013-05-10 MED ORDER — NEPRO/CARBSTEADY PO LIQD: 237.0000 mL | ORAL | Status: DC

## 2013-05-10 MED ORDER — DOXERCALCIFEROL 4 MCG/2ML IV SOLN
2.0000 ug | INTRAVENOUS | Status: DC
  Administered 2013-05-11: 2 ug via INTRAVENOUS
  Filled 2013-05-10: qty 2

## 2013-05-10 NOTE — Progress Notes (Signed)
Pt having obvious difficulty swallowing pills, making multiple swallow efforts with each pill, states has been going on for some time. No difficulty with breakfast. Pt also having tenderness to palpation to the back of the LLE, no redness or heat noted. Will continue to monitor.

## 2013-05-10 NOTE — Progress Notes (Signed)
Subjective:  Feeling stronger/ sitting in chair reading paper Objective Vital signs in last 24 hours: Filed Vitals:   05/09/13 1340 05/09/13 1751 05/09/13 2111 05/10/13 0453  BP: 123/48 121/49 127/55 153/54  Pulse: 83 81 85 100  Temp: 98.4 F (36.9 C) 98.3 F (36.8 C) 99.2 F (37.3 C) 98.9 F (37.2 C)  TempSrc: Oral Oral Oral Oral  Resp: 18 20 20 20   Height:      Weight:   65.176 kg (143 lb 11 oz)   SpO2: 92% 93% 93% 93%   Weight change: -1.724 kg (-3 lb 12.8 oz)  Intake/Output Summary (Last 24 hours) at 05/10/13 0912 Last data filed at 05/10/13 0800  Gross per 24 hour  Intake    840 ml  Output     50 ml  Net    790 ml   Labs: Basic Metabolic Panel:  Recent Labs Lab 05/07/13 1116 05/08/13 0553  NA 137 140  K 4.6 5.2  CL 96 101  CO2 27 25  GLUCOSE 89 78  BUN 14 25*  CREATININE 4.84* 6.38*  CALCIUM 10.3 9.7    Recent Labs Lab 05/07/13 1116 05/08/13 0553  WBC 4.2 3.2*  NEUTROABS 3.2  --   HGB 8.5* 8.2*  HCT 26.3* 25.3*  MCV 98.1 99.6  PLT 151 139*    Medications:   . amLODipine  10 mg Oral Daily  . antiseptic oral rinse  15 mL Mouth Rinse BID  . aspirin  81 mg Oral Daily  . atorvastatin  10 mg Oral QHS  . calcium acetate  2,001 mg Oral TID WC  . cinacalcet  30 mg Oral Q breakfast  . darbepoetin (ARANESP) injection - DIALYSIS  60 mcg Intravenous Q Wed-HD  . doxercalciferol  2 mcg Intravenous Q M,W,F-HD  . heparin  5,000 Units Subcutaneous Q8H  . loratadine  10 mg Oral Daily  . [START ON 05/11/2013] oseltamivir  30 mg Oral Q T,Th,Sa-HD  . sodium chloride  3 mL Intravenous Q12H  . sodium chloride  3 mL Intravenous Q12H  . traMADol-acetaminophen  1 tablet Oral TID    Physical Exam: General:Alert Elderly BF,  appropriate  Heart: RRR, soft 1/6 sem lsb. No rub  Lungs: cta pigastric area, non distended  Extremities: Trace bipedal edema with some bilat venous varicosities  Abdomen: BS positive, soft,nontender  Access: R Arm avgg patent  Dialysis:  [facility]  3.5h   66.5kg    2K/2.0Ca [place]   Heparin 4000   R arm AVG  BFR 400 Hectorol 4     Epogen 10K   Venofer 0  Labs: hgb 9.7 12/22 and 46 % tsat. pth 98< 223< 804 ( hectorol Will be decr. 4to 2) alb 4.1 cva 10.6 phos 4.9   Assessment/Plan  1. Influenza A (positive on admit labs) = tx per admit team/ yesterday admit team dc iv antibiotics 2. ESRD - HD today (on TTS [facility] schedule) with holiday schedule= MW SAT/ next hd sat  3. Hypertension/volume - bp stable / below edw last hd post hd/ yesterday wt =65.2 ( have been slowly tapering edw at [facility])/ hold amlodipine for low bp  4. Anemia - ESA and no iron/ inr esa to 100 hgb 8.2 5. Metabolic bone disease - Phoslo binder, taper vit d from op level with pth decr. 6. Nutrition - renal diet, renal vitamin   Ernest Haber, PA-C [place] [phone] 05/10/2013,9:12 AM  LOS: 3 days   I have  seen and examined patient, discussed with PA and agree with assessment and plan as outlined above.  Patient with flu A, defervescing, no volume or electrolyte issues. Stable from renal standpoint. Home soon per primary's notes.  Kelly Splinter MD pager [phone]    cell [phone] 05/10/2013, 12:44 PM

## 2013-05-10 NOTE — Progress Notes (Addendum)
INITIAL NUTRITION ASSESSMENT  DOCUMENTATION CODES Per approved criteria  -Not Applicable   INTERVENTION: If pt exhibits signs/symptoms of aspiration, recommend SLP evaluation to determine most appropriate diet texture and liquid consistency. Add Nepro Shake po daily, each supplement provides 425 kcal and 19 grams protein. RD to continue to follow nutrition care plan.  NUTRITION DIAGNOSIS: Increased nutrient needs related to HD and acute illness as evidenced by estimated needs.   Goal: Intake to meet >90% of estimated nutrition needs.  Monitor:  weight trends, lab trends, I/O's, PO intake, supplement tolerance  Reason for Assessment: MD Consult  80 y.o. female  Admitting Dx: Influenza A with respiratory manifestations  ASSESSMENT: PMHx significant for ESRD, HTN, aortic stenosis, cough. Admitted with worsening SOB. Work-up reveals Influenza A.  RN reports that pt is having obvious difficulty swallowing pills, but doesn't have any issues eating breakfast. Pt reports that she has been having trouble swallowing pills for quite some time, and she states that she just "deals with it." Doesn't have any issues with her food, per her report.   Pt with 9% wt loss x 6 months - this is not significant. She notes that her doctor has prescribed her a liquid diet PTA for about 2 weeks but is unable to tell me why. Meal intake presently is 50-100%. Will drink Nepro Shakes, RD to add at this time.  Pt is at nutrition risk 2/2 recent wt loss and acute illness.  Height: Ht Readings from Last 1 Encounters:  05/07/13 5' 5" (1.651 m)    Weight: Wt Readings from Last 1 Encounters:  05/09/13 143 lb 11 oz (65.176 kg)    Ideal Body Weight: 125 lb  % Ideal Body Weight: 114%  Wt Readings from Last 10 Encounters:  05/09/13 143 lb 11 oz (65.176 kg)  02/15/13 151 lb (68.493 kg)  11/24/12 149 lb 4 oz (67.7 kg)  11/12/12 154 lb (69.854 kg)  10/08/12 155 lb 6.4 oz (70.489 kg)  01/20/12 157 lb  12.8 oz (71.578 kg)  07/20/11 156 lb 6.4 oz (70.943 kg)  12/22/10 155 lb 3.2 oz (70.398 kg)  09/29/10 154 lb 12.8 oz (70.217 kg)  07/30/10 151 lb (68.493 kg)    Usual Body Weight: 150 - 155 lb  % Usual Body Weight: 91%  BMI:  Body mass index is 23.91 kg/(m^2). Normal weight  Estimated Nutritional Needs: Kcal: 1650 - 1800 Protein: at least 78 grams daily Fluid: 1.2 liters  Skin: intact  Diet Order: Renal  EDUCATION NEEDS: -No education needs identified at this time   Intake/Output Summary (Last 24 hours) at 05/10/13 1142 Last data filed at 05/10/13 0900  Gross per 24 hour  Intake   1080 ml  Output     50 ml  Net   1030 ml    Last BM: 12/29  Labs:   Recent Labs Lab 05/07/13 1116 05/08/13 0553  NA 137 140  K 4.6 5.2  CL 96 101  CO2 27 25  BUN 14 25*  CREATININE 4.84* 6.38*  CALCIUM 10.3 9.7  GLUCOSE 89 78    CBG (last 3)   Recent Labs  05/07/13 2150  GLUCAP 82    Scheduled Meds: . amLODipine  10 mg Oral Daily  . antiseptic oral rinse  15 mL Mouth Rinse BID  . aspirin  81 mg Oral Daily  . atorvastatin  10 mg Oral QHS  . calcium acetate  2,001 mg Oral TID WC  . cinacalcet  30 mg Oral Q  breakfast  . darbepoetin (ARANESP) injection - DIALYSIS  60 mcg Intravenous Q Wed-HD  . doxercalciferol  2 mcg Intravenous Q M,W,F-HD  . heparin  5,000 Units Subcutaneous Q8H  . loratadine  10 mg Oral Daily  . [START ON 05/11/2013] oseltamivir  30 mg Oral Q T,Th,Sa-HD  . sodium chloride  3 mL Intravenous Q12H  . sodium chloride  3 mL Intravenous Q12H  . traMADol-acetaminophen  1 tablet Oral TID    Continuous Infusions:   Past Medical History  Diagnosis Date  . HTN (hypertension)   . ESRD on hemodialysis   . Mild aortic stenosis   . Dyslipidemia   . TIA (transient ischemic attack)   . Gout   . Benign bladder mass   . DJD (degenerative joint disease)   . Anemia   . Obstructive sleep apnea     mild  . Chronic cough     Past Surgical History   Procedure Laterality Date  . Nephrectomy      left  . Total abdominal hysterectomy    . Bone marrow biopsy    . Left arm dialysis graft      Inda Coke MS, RD, LDN Pager: [phone] After-hours pager: [phone]

## 2013-05-10 NOTE — Progress Notes (Signed)
Pt SpO2 on room air 83% on room air. RR 18, no dyspnea, non-productive cough. MD notified, pt placed on 2L [place], SpO2 93-96%. Will continue to monitor.

## 2013-05-10 NOTE — Progress Notes (Signed)
[facility] PROGRESS NOTE  [name] [id number] DOB: 07/08/1921 DOA: 05/07/2013 PCP: Criselda Peaches, MD  HPI Had som nausea, vomiting   Assessment/Plan:  Principal Problem:  Influenza A with respiratory manifestations  Active Problems:  ESRD (end stage renal disease)  Cough  anemia: Secondary to chronic renal disease. On Aranesp.   1. Influenza A with respiratory manifestations  -Patient presented with shortness of breath, cough and generalized body aches.  -She is positive for influenza type A, started on Tamiflu, adjusted to her renal function. Stopped IV antibiotics, no evidence of pneumonia; hypoxia resolved with oxygen   2. End stage renal disease  -Does not seem to have acute fluid overload.  -Nephrology consulted for routine dialysis. Next dialysis session for Saturday 1/3   3. Hypertension  -Continue blood pressure control. Stable.    Code Status: full Family Communication: d/w patient  (indicate person spoken with, relationship, and if by phone, the number) Disposition Plan: home in 24 hours    Consultants:  Nephrology   Procedures:  None  Antibiotics:  None  (indicate start date, and stop date if known)  HPI/Subjective: alert  Objective: Filed Vitals:   05/10/13 0910  BP: 112/42  Pulse: 86  Temp: 98.7 F (37.1 C)  Resp: 16    Intake/Output Summary (Last 24 hours) at 05/10/13 1458 Last data filed at 05/10/13 0900  Gross per 24 hour  Intake    840 ml  Output     50 ml  Net    790 ml   Filed Weights   05/08/13 1911 05/08/13 2103 05/09/13 2111  Weight: 65 kg (143 lb 4.8 oz) 65.182 kg (143 lb 11.2 oz) 65.176 kg (143 lb 11 oz)    Exam:   General:  alert  Cardiovascular: s1,s2 rrr  Respiratory: few crackles in LL  Abdomen: soft, nt, nd   Musculoskeletal: no edema    Data Reviewed: Basic Metabolic Panel:  Recent Labs Lab 05/07/13 1116 05/08/13 0553  NA 137 140  K 4.6 5.2  CL 96 101  CO2 27 25  GLUCOSE  89 78  BUN 14 25*  CREATININE 4.84* 6.38*  CALCIUM 10.3 9.7   Liver Function Tests: No results found for this basename: AST, ALT, ALKPHOS, BILITOT, PROT, ALBUMIN,  in the last 168 hours No results found for this basename: LIPASE, AMYLASE,  in the last 168 hours No results found for this basename: AMMONIA,  in the last 168 hours CBC:  Recent Labs Lab 05/07/13 1116 05/08/13 0553  WBC 4.2 3.2*  NEUTROABS 3.2  --   HGB 8.5* 8.2*  HCT 26.3* 25.3*  MCV 98.1 99.6  PLT 151 139*   Cardiac Enzymes: No results found for this basename: CKTOTAL, CKMB, CKMBINDEX, TROPONINI,  in the last 168 hours BNP (last 3 results) No results found for this basename: PROBNP,  in the last 8760 hours CBG:  Recent Labs Lab 05/07/13 2150  GLUCAP 82    Recent Results (from the past 240 hour(s))  CULTURE, BLOOD (ROUTINE X 2)     Status: None   Collection Time    05/07/13 12:25 PM      Result Value Range Status   Specimen Description BLOOD LEFT WRIST   Final   Special Requests BOTTLES DRAWN AEROBIC ONLY 4ML   Final   Culture  Setup Time     Final   Value: 05/07/2013 14:36     Performed at [facility]  Final   Value:        BLOOD CULTURE RECEIVED NO GROWTH TO DATE CULTURE WILL BE HELD FOR 5 DAYS BEFORE ISSUING A FINAL NEGATIVE REPORT     Performed at [facility]   Report Status PENDING   Incomplete  CULTURE, BLOOD (ROUTINE X 2)     Status: None   Collection Time    05/07/13 12:30 PM      Result Value Range Status   Specimen Description BLOOD LEFT HAND   Final   Special Requests BOTTLES DRAWN AEROBIC AND ANAEROBIC 3ML EA   Final   Culture  Setup Time     Final   Value: 05/07/2013 14:36     Performed at [facility]   Culture     Final   Value:        BLOOD CULTURE RECEIVED NO GROWTH TO DATE CULTURE WILL BE HELD FOR 5 DAYS BEFORE ISSUING A FINAL NEGATIVE REPORT     Performed at [facility]   Report Status PENDING   Incomplete     Studies: No  results found.  Scheduled Meds: . amLODipine  10 mg Oral Daily  . antiseptic oral rinse  15 mL Mouth Rinse BID  . aspirin  81 mg Oral Daily  . atorvastatin  10 mg Oral QHS  . calcium acetate  2,001 mg Oral TID WC  . cinacalcet  30 mg Oral Q breakfast  . darbepoetin (ARANESP) injection - DIALYSIS  60 mcg Intravenous Q Wed-HD  . [START ON 05/11/2013] doxercalciferol  2 mcg Intravenous Q T,Th,Sa-HD  . feeding supplement (NEPRO CARB STEADY)  237 mL Oral Q24H  . heparin  5,000 Units Subcutaneous Q8H  . loratadine  10 mg Oral Daily  . [START ON 05/11/2013] oseltamivir  30 mg Oral Q T,Th,Sa-HD  . sodium chloride  3 mL Intravenous Q12H  . sodium chloride  3 mL Intravenous Q12H  . traMADol-acetaminophen  1 tablet Oral TID   Continuous Infusions:   Principal Problem:   Influenza A with respiratory manifestations Active Problems:   ESRD (end stage renal disease)   Cough    Time spent: >35 minutes     Kinnie Feil  [facility] Pager [phone]. If 7PM-7AM, please contact night-coverage at [email or web address], password [email or web address] 05/10/2013, 2:58 PM  LOS: 3 days

## 2013-05-11 LAB — BASIC METABOLIC PANEL
BUN: 39 mg/dL — ABNORMAL HIGH (ref 6–23)
CHLORIDE: 92 meq/L — AB (ref 96–112)
CO2: 26 meq/L (ref 19–32)
CREATININE: 8.43 mg/dL — AB (ref 0.50–1.10)
Calcium: 9.2 mg/dL (ref 8.4–10.5)
GFR calc Af Amer: 4 mL/min — ABNORMAL LOW (ref >90)
GFR calc non Af Amer: 4 mL/min — ABNORMAL LOW (ref >90)
GLUCOSE: 84 mg/dL (ref 70–99)
Potassium: 4.2 mEq/L (ref 3.7–5.3)
Sodium: 136 mEq/L — ABNORMAL LOW (ref 137–147)

## 2013-05-11 LAB — CBC
HCT: 27.9 % — ABNORMAL LOW (ref 36.0–46.0)
Hemoglobin: 9 g/dL — ABNORMAL LOW (ref 12.0–15.0)
MCH: 31.4 pg (ref 26.0–34.0)
MCHC: 32.3 g/dL (ref 30.0–36.0)
MCV: 97.2 fL (ref 78.0–100.0)
Platelets: 165 10*3/uL (ref 150–400)
RBC: 2.87 MIL/uL — ABNORMAL LOW (ref 3.87–5.11)
RDW: 14 % (ref 11.5–15.5)
WBC: 3 10*3/uL — AB (ref 4.0–10.5)

## 2013-05-11 MED ORDER — DARBEPOETIN ALFA-POLYSORBATE 40 MCG/0.4ML IJ SOLN
INTRAMUSCULAR | Status: AC
  Administered 2013-05-11: 40 ug
  Filled 2013-05-11: qty 0.4

## 2013-05-11 MED ORDER — DARBEPOETIN ALFA-POLYSORBATE 100 MCG/0.5ML IJ SOLN: 100.0000 ug | INTRAMUSCULAR | Status: DC

## 2013-05-11 MED ORDER — DARBEPOETIN ALFA-POLYSORBATE 40 MCG/0.4ML IJ SOLN: 40.0000 ug | Freq: Once | INTRAMUSCULAR | Status: DC

## 2013-05-11 MED ORDER — OSELTAMIVIR PHOSPHATE 30 MG PO CAPS: 30.0000 mg | ORAL_CAPSULE | ORAL | Status: DC

## 2013-05-11 MED ORDER — DOXERCALCIFEROL 4 MCG/2ML IV SOLN
INTRAVENOUS | Status: AC
  Filled 2013-05-11: qty 2

## 2013-05-11 MED ORDER — HEPARIN SODIUM (PORCINE) 1000 UNIT/ML IJ SOLN
4000.0000 [IU] | Freq: Once | INTRAMUSCULAR | Status: AC
  Administered 2013-05-11: 4000 [IU] via INTRAVENOUS

## 2013-05-11 MED ORDER — AMLODIPINE BESYLATE 10 MG PO TABS: 5.0000 mg | ORAL_TABLET | Freq: Every day | ORAL | Status: DC

## 2013-05-11 NOTE — Progress Notes (Signed)
Patient discharge Home per Md order.  Discharge instructions reviewed with patient and family.  Copies of all forms given and explained. Patient/family voiced understanding of all instructions.  Discharge in no acute distress. Adelene Idler, RN [facility], BSN, MSN

## 2013-05-11 NOTE — Discharge Summary (Signed)
Physician Discharge Summary  [name] [id number] DOB: [birth year] DOA: 05/07/2013  PCP: Criselda Peaches, MD  Admit date: 05/07/2013 Discharge date: 05/11/2013  Time spent: >35 minutes  Recommendations for Outpatient Follow-up:  F/u with nephrology for dial;ysis as scheduled  F/u with PCP in 1 week  Discharge Diagnoses:  Principal Problem:   Influenza A with respiratory manifestations Active Problems:   ESRD (end stage renal disease)   Cough   Discharge Condition: stable   Diet recommendation: renal   Filed Weights   05/10/13 2057 05/11/13 0710 05/11/13 1047  Weight: 65.176 kg (143 lb 11 oz) 65.2 kg (143 lb 11.8 oz) 63.2 kg (139 lb 5.3 oz)    History of present illness/Hospital Course:  Principal Problem:  Influenza A with respiratory manifestations  Active Problems:  ESRD (end stage renal disease)  Cough  anemia: Secondary to chronic renal disease. On Aranesp.   1. Influenza A with respiratory manifestations  -Patient presented with shortness of breath, cough and generalized body aches.  -She is positive for influenza type A, started on Tamiflu, adjusted to her renal function. Stopped IV antibiotics, no evidence of pneumonia; hypoxia resolved with oxygen  2. End stage renal disease  -Does not seem to have acute fluid overload.  -HD as scheduled   3. Hypertension  -Continue blood pressure control. Stable.      Procedures:  HD (i.e. Studies not automatically included, echos, thoracentesis, etc; not x-rays)  Consultations:  Nephrologist   Discharge Exam: Filed Vitals:   05/11/13 1300  BP: 133/56  Pulse: 88  Temp: 98 F (36.7 C)  Resp: 20    General: alert Cardiovascular: s1,s2 rrr Respiratory: CTA BL  Discharge Instructions  Discharge Orders   Future Orders Complete By Expires   Diet - low sodium heart healthy  As directed    Discharge instructions  As directed    Comments:     Please follow up with primary care doctor in 1 week   Increase activity slowly  As directed        Medication List    STOP taking these medications       ciprofloxacin 500 MG tablet  Commonly known as:  CIPRO     fexofenadine 180 MG tablet  Commonly known as:  ALLEGRA      TAKE these medications       acetaminophen 500 MG tablet  Commonly known as:  TYLENOL  Take 500 mg by mouth every 6 (six) hours as needed (pain).     amLODipine 10 MG tablet  Commonly known as:  NORVASC  Take 0.5 tablets (5 mg total) by mouth daily.     aspirin 81 MG tablet  Take 81 mg by mouth daily.     atorvastatin 10 MG tablet  Commonly known as:  LIPITOR  Take 10 mg by mouth at bedtime.     calcium acetate 667 MG capsule  Commonly known as:  PHOSLO  Take 2,001 mg by mouth 3 (three) times daily with meals.     multivitamin tablet  Take 1 tablet by mouth daily.     oseltamivir 30 MG capsule  Commonly known as:  TAMIFLU  Take 1 capsule (30 mg total) by mouth Every Tuesday,Thursday,and Saturday with dialysis.     PROAIR HFA 108 (90 BASE) MCG/ACT inhaler  Generic drug:  albuterol  1-2 puffs every 4-6 hours as needed     SENSIPAR 30 MG tablet  Generic drug:  cinacalcet  Take 1 tablet by  mouth daily.     traMADol-acetaminophen 37.5-325 MG per tablet  Commonly known as:  ULTRACET  Take 1 tablet by mouth 3 (three) times daily.       Allergies  Allergen Reactions  . Sulfa Antibiotics   . Sulfonamide Derivatives        Follow-up Information   Follow up with GREEN, EDWIN JAY, MD In 1 week.   Specialty:  Internal Medicine   Contact information:   [address] [place] [address] [phone]        The results of significant diagnostics from this hospitalization (including imaging, microbiology, ancillary and laboratory) are listed below for reference.    Significant Diagnostic Studies: Dg Chest 2 View  05/07/2013   CLINICAL DATA:  Cough and shortness of breath for 2 days  EXAM: CHEST  2 VIEW  COMPARISON:   01/01/2013  FINDINGS: Interstitial coarsening, likely accentuated by low lung volumes. No evidence of consolidation, edema, effusion, or pneumothorax. Stable cardiomegaly. Tortuous and atherosclerotic aorta.  IMPRESSION: No active cardiopulmonary disease.   Electronically Signed   By: Jorje Guild M.D.   On: 05/07/2013 10:59    Microbiology: Recent Results (from the past 240 hour(s))  CULTURE, BLOOD (ROUTINE X 2)     Status: None   Collection Time    05/07/13 12:25 PM      Result Value Range Status   Specimen Description BLOOD LEFT WRIST   Final   Special Requests BOTTLES DRAWN AEROBIC ONLY 4ML   Final   Culture  Setup Time     Final   Value: 05/07/2013 14:36     Performed at [facility]   Culture     Final   Value:        BLOOD CULTURE RECEIVED NO GROWTH TO DATE CULTURE WILL BE HELD FOR 5 DAYS BEFORE ISSUING A FINAL NEGATIVE REPORT     Performed at [facility]   Report Status PENDING   Incomplete  CULTURE, BLOOD (ROUTINE X 2)     Status: None   Collection Time    05/07/13 12:30 PM      Result Value Range Status   Specimen Description BLOOD LEFT HAND   Final   Special Requests BOTTLES DRAWN AEROBIC AND ANAEROBIC 3ML EA   Final   Culture  Setup Time     Final   Value: 05/07/2013 14:36     Performed at [facility]   Culture     Final   Value:        BLOOD CULTURE RECEIVED NO GROWTH TO DATE CULTURE WILL BE HELD FOR 5 DAYS BEFORE ISSUING A FINAL NEGATIVE REPORT     Performed at [facility]   Report Status PENDING   Incomplete     Labs: Basic Metabolic Panel:  Recent Labs Lab 05/07/13 1116 05/08/13 0553 05/11/13 0739  NA 137 140 136*  K 4.6 5.2 4.2  CL 96 101 92*  CO2 27 25 26   GLUCOSE 89 78 84  BUN 14 25* 39*  CREATININE 4.84* 6.38* 8.43*  CALCIUM 10.3 9.7 9.2   Liver Function Tests: No results found for this basename: AST, ALT, ALKPHOS, BILITOT, PROT, ALBUMIN,  in the last 168 hours No results found for this basename: LIPASE,  AMYLASE,  in the last 168 hours No results found for this basename: AMMONIA,  in the last 168 hours CBC:  Recent Labs Lab 05/07/13 1116 05/08/13 0553 05/11/13 0805  WBC 4.2 3.2*  3.0*  NEUTROABS 3.2  --   --   HGB 8.5* 8.2* 9.0*  HCT 26.3* 25.3* 27.9*  MCV 98.1 99.6 97.2  PLT 151 139* 165   Cardiac Enzymes: No results found for this basename: CKTOTAL, CKMB, CKMBINDEX, TROPONINI,  in the last 168 hours BNP: BNP (last 3 results) No results found for this basename: PROBNP,  in the last 8760 hours CBG:  Recent Labs Lab 05/07/13 2150  GLUCAP 82       Signed:  Seydou Hearns N  [facility] 05/11/2013, 1:42 PM

## 2013-05-11 NOTE — Discharge Instructions (Signed)
Avian Influenza Viruses Avian influenza or "bird flu" is also known as H5N1 virus. It occurs naturally in wild and domestic birds. [place] flu is easily spread (contagious) among birds and is deadly to them. Though rare, bird flu can cause disease in humans.  CAUSES  Infected birds can shed the H5N1 virus through:   Feces.  Nasal secretions.  Saliva. Birds become infected when they come into contact with infected birds or contaminated surfaces. The bird flu virus is spread from country to country through international poultry trade or by migrating birds.  MODES OF TRANSMISSION TO HUMANS The bird flu virus does not normally infect humans. However, the virus can infect humans who have contact with infected birds, breath in dust or touch surfaces contaminated with the virus. Human to human transmission of the H5N1 virus has been rare. The virus lacks the ability to grow itself (replicate) in humans. However, because all influenza viruses can mutate, scientists are concerned the H5N1 virus will someday replicate itself and make human to human transmission easier. If this happens, an influenza "pandemic" could occur.  SYMPTOMS   Symptoms of H5N1 virus are similar to other influenza viruses:  Fever.  Cough.  Sore throat.  Nausea and vomiting.  Diarrhea.  Muscle aches.  Tiredness (malaise).  Some people may get inflammation or redness of the eyes (conjunctivitis).  Life-threatening complications may result in the death of the patient. These include:  Viral pneumonia.  Breathing (respiratory) distress syndrome.  Multi-organ failure. DIAGNOSIS   A person with a respiratory illness may be suffering from bird flu if direct or indirect contact has been made with infected birds. This includes handling or taking care of sick birds. The H5N1 virus may also be suspected if a person has breathed in particles or touched surfaces contaminated with the virus.  In addition to the above symptoms,  a chest X-ray is useful to detect pneumonia.  Fluid specimens such as a sputum sample may be sent to a laboratory for further investigation.  Blood tests may be be done to help detect the H5N1 virus. TREATMENT   The H5N1 virus has shown resistance to amantadine and rimantadine, which are two antiviral drugs commonly used for other influenza viruses. However, two other antivirals, oseltamavir and zanamavir, seem to be effective against the H5N1 strain.  If bird flu is suspected in a person, treatment should start immediately without waiting for laboratory confirmation.  Treatment for the H5N1 strain is essentially the same as treating other influenza viruses. PREVENTION   Stay home from work, school and errands when you are sick. Not being in contact with other people will help stop the spread of illness.  Cover your mouth and nose with your arm when coughing or sneezing. This may help those around you from getting sick.  Wash your hands often with warm water and soap. Illnesses are often spread when a person touches something that is contaminated with germs and then touches his or her eyes, nose, or mouth.  Antiviral medications can help prevent the flu.  For optimal health, get plenty of rest, eat a healthy diet and exercise. Document Released: 04/28/2003 Document Revised: 07/18/2011 Document Reviewed: 11/22/2007 [facility] Patient Information 2014 [place], [place].

## 2013-05-11 NOTE — Procedures (Signed)
I was present at this dialysis session, have reviewed the session itself and made  appropriate changes  Kelly Splinter MD (pgr) [phone]    ([phone] 05/11/2013, 9:54 AM

## 2013-05-11 NOTE — Progress Notes (Signed)
[facility] Progress Note  Subjective:   On HD wearing mask, no appetite  Objective Filed Vitals:   05/11/13 0419 05/11/13 0710 05/11/13 0727 05/11/13 0732  BP: 133/54 133/53 117/56 133/64  Pulse: 77 74 72 75  Temp: 98.5 F (36.9 C) 98.1 F (36.7 C)    TempSrc: Oral Oral    Resp: 16 17 18 16   Height:      Weight:  65.2 kg (143 lb 11.8 oz)    SpO2: 93% 92% 93% 97%   Physical Exam goal 2.5 General: NAD on HD Heart: RRR Lungs: no wheeze or rales Abdomen: soft NT Extremities: no edema Dialysis Access: right AVGG Qb 400  Dialysis: [facility]  3.5h 66.5kg 2K/2.0Ca [place] Heparin 4000 R arm AVG BFR 400  Hectorol 4 Epogen 10K Venofer 0  Labs: hgb 9.7 12/22 and 46 % tsat. pth 98< 223< 804 ( hectorol Will be decr. 4to 2) alb 4.1 cva 10.6 phos 4.9  Assessment/Plan: 1. Influenza A (positive on admit labs) = tx per admit team/ yesterday admit team dc'd iv antibiotics; on tamiflu 2. ESRD - HD today (on TTS [facility] - will have a lower EDW at d/c - get standing weight post HD today 3. Hypertension/volume - bp stable / below edw l( have been slowly tapering edw at [facility])/on norvasc 10 4. Anemia - hgb 8.5>9 (9.7 prior) - got 60 Aranesp on 12/31 - give additional 40 today and then change to 100/week 5. Metabolic bone disease - Phoslo binder, hectorol decreased to 2 at admission due to decline and iPTH andh ^ Ca. 6. Nutrition - renal diet, renal vitamin - not eating much  Myriam Jacobson, PA-C [place] 05/11/2013,8:28 AM  LOS: 4 days   I have seen and examined patient, discussed with PA and agree with assessment and plan as outlined above. Kelly Splinter MD pager [phone]    cell [phone] 05/11/2013, 10:26 AM     Additional Objective Labs: Basic Metabolic Panel:  Recent Labs Lab 05/07/13 1116 05/08/13 0553 05/11/13 0739  NA 137 140 136*  K 4.6 5.2 4.2  CL 96 101 92*  CO2 27 25 26   GLUCOSE 89 78 84  BUN 14 25* 39*   CREATININE 4.84* 6.38* 8.43*  CALCIUM 10.3 9.7 9.2  CBC:  Recent Labs Lab 05/07/13 1116 05/08/13 0553 05/11/13 0805  WBC 4.2 3.2* 3.0*  NEUTROABS 3.2  --   --   HGB 8.5* 8.2* 9.0*  HCT 26.3* 25.3* 27.9*  MCV 98.1 99.6 97.2  PLT 151 139* 165   Blood Culture    Component Value Date/Time   SDES BLOOD LEFT HAND 05/07/2013 1230   SPECREQUEST BOTTLES DRAWN AEROBIC AND ANAEROBIC 3ML EA 05/07/2013 1230   CULT  Value:        BLOOD CULTURE RECEIVED NO GROWTH TO DATE CULTURE WILL BE HELD FOR 5 DAYS BEFORE ISSUING A FINAL NEGATIVE REPORT Performed at [facility] 05/07/2013 1230   REPTSTATUS PENDING 05/07/2013 1230  CBG:  Recent Labs Lab 05/07/13 2150  GLUCAP 82  No results found. Medications:   . amLODipine  10 mg Oral Daily  . antiseptic oral rinse  15 mL Mouth Rinse BID  . aspirin  81 mg Oral Daily  . atorvastatin  10 mg Oral QHS  . calcium acetate  2,001 mg Oral TID WC  . cinacalcet  30 mg Oral Q breakfast  . darbepoetin (ARANESP) injection - DIALYSIS  60 mcg Intravenous Q Wed-HD  .  doxercalciferol      . doxercalciferol  2 mcg Intravenous Q T,Th,Sa-HD  . feeding supplement (NEPRO CARB STEADY)  237 mL Oral Q24H  . heparin  5,000 Units Subcutaneous Q8H  . loratadine  10 mg Oral Daily  . oseltamivir  30 mg Oral Q T,Th,Sa-HD  . sodium chloride  3 mL Intravenous Q12H  . sodium chloride  3 mL Intravenous Q12H  . traMADol-acetaminophen  1 tablet Oral TID

## 2013-05-13 LAB — CULTURE, BLOOD (ROUTINE X 2)
Culture: NO GROWTH
Culture: NO GROWTH

## 2014-04-15 ENCOUNTER — Other Ambulatory Visit (HOSPITAL_COMMUNITY): Payer: Self-pay | Admitting: Internal Medicine

## 2014-04-15 DIAGNOSIS — R0989 Other specified symptoms and signs involving the circulatory and respiratory systems: Secondary | ICD-10-CM

## 2014-04-16 ENCOUNTER — Ambulatory Visit (HOSPITAL_COMMUNITY)
Admission: RE | Admit: 2014-04-16 | Discharge: 2014-04-16 | Disposition: A | Discharge status: Home / Self Care | Payer: Medicare Other | Source: Ambulatory Visit | Attending: Internal Medicine | Admitting: Internal Medicine

## 2014-04-16 DIAGNOSIS — R0989 Other specified symptoms and signs involving the circulatory and respiratory systems: Secondary | ICD-10-CM

## 2014-04-16 NOTE — Progress Notes (Signed)
*  PRELIMINARY RESULTS* Vascular Ultrasound Carotid Duplex (Doppler) has been completed.  Findings suggest 1-39% internal carotid artery stenosis bilaterally. Vertebral arteries are patent with antegrade flow.  04/16/2014 9:37 AM Maudry Mayhew, RVT, RDCS, RDMS

## 2014-11-19 ENCOUNTER — Encounter: Payer: Self-pay | Admitting: Surgery

## 2014-11-21 ENCOUNTER — Ambulatory Visit (INDEPENDENT_AMBULATORY_CARE_PROVIDER_SITE_OTHER): Payer: Medicare Other | Admitting: Surgery

## 2014-11-21 ENCOUNTER — Other Ambulatory Visit: Payer: Self-pay

## 2014-11-21 ENCOUNTER — Encounter: Payer: Self-pay | Admitting: Surgery

## 2014-11-21 ENCOUNTER — Encounter (HOSPITAL_COMMUNITY): Payer: Self-pay | Admitting: *Deleted

## 2014-11-21 VITALS — BP 141/67 | HR 66 | Resp 16 | Ht 63.0 in | Wt 146.0 lb

## 2014-11-21 DIAGNOSIS — N186 End stage renal disease: Secondary | ICD-10-CM

## 2014-11-21 DIAGNOSIS — Z992 Dependence on renal dialysis: Secondary | ICD-10-CM

## 2014-11-21 NOTE — Progress Notes (Signed)
Pt denies SOB, chest pain, and being under the care of a cardiologist. Pt denies having an echo and cardiac cath. Pt denies having an EKG and chest x ray within the last year. Pt made aware to stop taking otc vitamins and herbal medications. Do not take any NSAIDs ie: Ibuprofen, Advil, Naproxen. Pt verbalized understanding of all pre-op instructions.

## 2014-11-21 NOTE — Progress Notes (Signed)
Filed Vitals:   11/21/14 0942 11/21/14 0943  BP: 157/64 141/67  Pulse: 79 66  Resp: 16   Height: 5\' 3"  (1.6 m)   Weight: 146 lb (66.225 kg)

## 2014-11-21 NOTE — Progress Notes (Signed)
Patient name: [name] MRN: [id number] DOB: November 11, 1921 Sex: female     Chief Complaint  Patient presents with  . New Evaluation    ulcerations to right AVG  referred by Dr Marval Regal    HISTORY OF PRESENT ILLNESS: This is a very pleasant 81 year old female who is referred for ulcerations on her right forearm graft.  The patient states that these have been present for 1-2 weeks.  She has not been having any problems with bleeding or cannulation during dialysis.  She denies any signs of infection.  She is on dialysis Tuesday Thursday Saturday.  The patient's renal failure is secondary to hypertension.  Her hypercholesterolemia is managed with a statin.  She denies any coronary disease.  Past Medical History  Diagnosis Date  . HTN (hypertension)   . ESRD on hemodialysis   . Mild aortic stenosis   . Dyslipidemia   . TIA (transient ischemic attack)   . Gout   . Benign bladder mass   . DJD (degenerative joint disease)   . Anemia   . Obstructive sleep apnea     mild  . Chronic cough     Past Surgical History  Procedure Laterality Date  . Nephrectomy      left  . Total abdominal hysterectomy    . Bone marrow biopsy    . Left arm dialysis graft      History   Social History  . Marital Status: Widowed    Spouse Name: N/A  . Number of Children: N/A  . Years of Education: N/A   Occupational History  . Retired     Secretary/administrator   Social History Main Topics  . Smoking status: Never Smoker   . Smokeless tobacco: Never Used  . Alcohol Use: No  . Drug Use: No  . Sexual Activity: No   Other Topics Concern  . Not on file   Social History Narrative    Family History  Problem Relation Age of Onset  . Hypertension Mother   . Diabetes Mother     Allergies as of 11/21/2014 - Review Complete 11/21/2014  Allergen Reaction Noted  . Sulfa antibiotics  07/21/2010  . Sulfonamide derivatives  02/06/2008    Current Outpatient Prescriptions on File Prior to  Visit  Medication Sig Dispense Refill  . acetaminophen (TYLENOL) 500 MG tablet Take 500 mg by mouth every 6 (six) hours as needed (pain).    Marland Kitchen albuterol (PROAIR HFA) 108 (90 BASE) MCG/ACT inhaler 1-2 puffs every 4-6 hours as needed     . amLODipine (NORVASC) 10 MG tablet Take 0.5 tablets (5 mg total) by mouth daily. (Patient not taking: Reported on 11/21/2014)    . aspirin 81 MG tablet Take 81 mg by mouth daily.      Marland Kitchen atorvastatin (LIPITOR) 10 MG tablet Take 10 mg by mouth at bedtime.      . calcium acetate (PHOSLO) 667 MG capsule Take 2,001 mg by mouth 3 (three) times daily with meals.    . Multiple Vitamin (MULTIVITAMIN) tablet Take 1 tablet by mouth daily.      Marland Kitchen oseltamivir (TAMIFLU) 30 MG capsule Take 1 capsule (30 mg total) by mouth Every Tuesday,Thursday,and Saturday with dialysis. 3 capsule 0  . SENSIPAR 30 MG tablet Take 1 tablet by mouth daily.    . traMADol-acetaminophen (ULTRACET) 37.5-325 MG per tablet Take 1 tablet by mouth 3 (three) times daily.    . [DISCONTINUED] chlorpheniramine (CHLOR-TRIMETON) 4 MG tablet One pill at  night as needed for cough 30 tablet 0   No current facility-administered medications on file prior to visit.     REVIEW OF SYSTEMS: Cardiovascular: No chest pain, chest pressure, palpitations, orthopnea, or dyspnea on exertion. No claudication or rest pain,  No history of DVT or phlebitis. Pulmonary: No productive cough, asthma or wheezing. Neurologic: No weakness, paresthesias, aphasia, or amaurosis. No dizziness. Hematologic: No bleeding problems or clotting disorders. Musculoskeletal: No joint pain or joint swelling. Gastrointestinal: No blood in stool or hematemesis Genitourinary: No dysuria or hematuria. Psychiatric:: No history of major depression. Integumentary: No rashes or ulcers. Constitutional: No fever or chills.  PHYSICAL EXAMINATION:   Vital signs are  Filed Vitals:   11/21/14 0942 11/21/14 0943  BP: 157/64 141/67  Pulse: 79 66  Resp:  16   Height: 5' 3" (1.6 m)   Weight: 146 lb (66.225 kg)    Body mass index is 25.87 kg/(m^2). General: The patient appears their stated age. HEENT:  No gross abnormalities Pulmonary:  Non labored breathing Abdomen: Soft and non-tender Musculoskeletal: There are no major deformities. Neurologic: No focal weakness or paresthesias are detected, Skin: There are no ulcer or rashes noted. Psychiatric: The patient has normal affect. Cardiovascular: Palpable thrill within right forearm graft.  Aneurysmal changes are noted on the medial and lateral aspects of the graft.  The medial side is more pronounced.  Over top of the aneurysm there is a dry eschar.   Diagnostic Studies None  Assessment: End-stage renal disease Plan: The patient has aneurysmal degeneration as well as early ulceration on the medial side of her right forearm graft.  I have recommended replacement of the medial half of the graft to address both the aneurysm as well as the early ulcer.  She is willing to proceed.  I feel that this needs to be done sooner rather than later given the eschar over top of the aneurysm, to prevent a more emergent situation.  This will need to be done on a nondialysis day.  I have scheduled this for Dr. Chen this coming Monday.  V. Wells Brabham IV, M.D. [facility] Office: [phone] Pager:  [phone]   

## 2014-11-24 ENCOUNTER — Encounter (HOSPITAL_COMMUNITY): Payer: Self-pay | Admitting: *Deleted

## 2014-11-24 ENCOUNTER — Telehealth: Payer: Self-pay | Admitting: Vascular Surgery

## 2014-11-24 ENCOUNTER — Ambulatory Visit (HOSPITAL_COMMUNITY): Payer: Medicare Other | Admitting: Anesthesiology

## 2014-11-24 ENCOUNTER — Encounter (HOSPITAL_COMMUNITY)
Admission: RE | Disposition: A | Discharge status: Home / Self Care | Payer: Self-pay | Source: Ambulatory Visit | Attending: Vascular Surgery

## 2014-11-24 ENCOUNTER — Ambulatory Visit (HOSPITAL_COMMUNITY)
Admission: RE | Admit: 2014-11-24 | Discharge: 2014-11-24 | Disposition: A | Discharge status: Home / Self Care | Payer: Medicare Other | Source: Ambulatory Visit | Attending: Vascular Surgery | Admitting: Vascular Surgery

## 2014-11-24 DIAGNOSIS — N186 End stage renal disease: Secondary | ICD-10-CM | POA: Insufficient documentation

## 2014-11-24 DIAGNOSIS — M1990 Unspecified osteoarthritis, unspecified site: Secondary | ICD-10-CM | POA: Insufficient documentation

## 2014-11-24 DIAGNOSIS — Z8673 Personal history of transient ischemic attack (TIA), and cerebral infarction without residual deficits: Secondary | ICD-10-CM | POA: Insufficient documentation

## 2014-11-24 DIAGNOSIS — T82898A Other specified complication of vascular prosthetic devices, implants and grafts, initial encounter: Secondary | ICD-10-CM | POA: Insufficient documentation

## 2014-11-24 DIAGNOSIS — E780 Pure hypercholesterolemia: Secondary | ICD-10-CM | POA: Insufficient documentation

## 2014-11-24 DIAGNOSIS — I120 Hypertensive chronic kidney disease with stage 5 chronic kidney disease or end stage renal disease: Secondary | ICD-10-CM | POA: Insufficient documentation

## 2014-11-24 DIAGNOSIS — G4733 Obstructive sleep apnea (adult) (pediatric): Secondary | ICD-10-CM | POA: Insufficient documentation

## 2014-11-24 DIAGNOSIS — E785 Hyperlipidemia, unspecified: Secondary | ICD-10-CM | POA: Insufficient documentation

## 2014-11-24 DIAGNOSIS — Z992 Dependence on renal dialysis: Secondary | ICD-10-CM | POA: Insufficient documentation

## 2014-11-24 HISTORY — PX: REVISION OF ARTERIOVENOUS GORETEX GRAFT: SHX6073

## 2014-11-24 HISTORY — DX: Orthostatic hypotension: I95.1

## 2014-11-24 HISTORY — DX: Pneumonia, unspecified organism: J18.9

## 2014-11-24 LAB — POCT I-STAT 4, (NA,K, GLUC, HGB,HCT)
Glucose, Bld: 83 mg/dL (ref 65–99)
HCT: 40 % (ref 36.0–46.0)
Hemoglobin: 13.6 g/dL (ref 12.0–15.0)
POTASSIUM: 5 mmol/L (ref 3.5–5.1)
SODIUM: 136 mmol/L (ref 135–145)

## 2014-11-24 SURGERY — REVISION OF ARTERIOVENOUS GORETEX GRAFT
Anesthesia: Monitor Anesthesia Care | Site: Arm Lower | Laterality: Right

## 2014-11-24 MED ORDER — ACETAMINOPHEN 325 MG PO TABS: 325.0000 mg | ORAL_TABLET | ORAL | Status: DC | PRN

## 2014-11-24 MED ORDER — LIDOCAINE-EPINEPHRINE (PF) 1 %-1:200000 IJ SOLN
INTRAMUSCULAR | Status: DC | PRN
  Administered 2014-11-24: 10 mL via INTRADERMAL

## 2014-11-24 MED ORDER — LIDOCAINE-EPINEPHRINE (PF) 1 %-1:200000 IJ SOLN
INTRAMUSCULAR | Status: AC
  Filled 2014-11-24: qty 10

## 2014-11-24 MED ORDER — ONDANSETRON HCL 4 MG/2ML IJ SOLN
INTRAMUSCULAR | Status: DC | PRN
  Administered 2014-11-24: 4 mg via INTRAVENOUS

## 2014-11-24 MED ORDER — OXYCODONE HCL 5 MG PO TABS: 5.0000 mg | ORAL_TABLET | Freq: Once | ORAL | Status: DC | PRN

## 2014-11-24 MED ORDER — CHLORHEXIDINE GLUCONATE CLOTH 2 % EX PADS
6.0000 | MEDICATED_PAD | Freq: Once | CUTANEOUS | Status: DC
Start: 2014-11-24 — End: 2014-11-24

## 2014-11-24 MED ORDER — HEPARIN SODIUM (PORCINE) 1000 UNIT/ML IJ SOLN
INTRAMUSCULAR | Status: DC | PRN
  Administered 2014-11-24: 5000 [IU] via INTRAVENOUS

## 2014-11-24 MED ORDER — THROMBIN 20000 UNITS EX SOLR
CUTANEOUS | Status: AC
  Filled 2014-11-24: qty 20000

## 2014-11-24 MED ORDER — PROPOFOL 10 MG/ML IV BOLUS
INTRAVENOUS | Status: DC | PRN
  Administered 2014-11-24 (×5): 10 mg via INTRAVENOUS
  Administered 2014-11-24: 20 mg via INTRAVENOUS

## 2014-11-24 MED ORDER — BUPIVACAINE HCL 0.5 % IJ SOLN
INTRAMUSCULAR | Status: DC | PRN
  Administered 2014-11-24: 10 mL

## 2014-11-24 MED ORDER — THROMBIN 20000 UNITS EX SOLR
CUTANEOUS | Status: DC | PRN
  Administered 2014-11-24: 20 mL via TOPICAL

## 2014-11-24 MED ORDER — FENTANYL CITRATE (PF) 100 MCG/2ML IJ SOLN
25.0000 ug | INTRAMUSCULAR | Status: DC | PRN
Start: 2014-11-24 — End: 2014-11-24

## 2014-11-24 MED ORDER — SODIUM CHLORIDE 0.9 % IV SOLN
INTRAVENOUS | Status: DC | PRN
  Administered 2014-11-24: 10:00:00 via INTRAVENOUS

## 2014-11-24 MED ORDER — ACETAMINOPHEN 160 MG/5ML PO SOLN: 325.0000 mg | ORAL | Status: DC | PRN

## 2014-11-24 MED ORDER — FENTANYL CITRATE (PF) 100 MCG/2ML IJ SOLN
INTRAMUSCULAR | Status: DC | PRN
  Administered 2014-11-24 (×5): 50 ug via INTRAVENOUS

## 2014-11-24 MED ORDER — SODIUM CHLORIDE 0.9 % IV SOLN
INTRAVENOUS | Status: DC
  Administered 2014-11-24: 07:00:00 via INTRAVENOUS

## 2014-11-24 MED ORDER — 0.9 % SODIUM CHLORIDE (POUR BTL) OPTIME
TOPICAL | Status: DC | PRN
  Administered 2014-11-24: 1000 mL

## 2014-11-24 MED ORDER — ARTIFICIAL TEARS OP OINT
TOPICAL_OINTMENT | OPHTHALMIC | Status: AC
  Filled 2014-11-24: qty 3.5

## 2014-11-24 MED ORDER — PROTAMINE SULFATE 10 MG/ML IV SOLN
INTRAVENOUS | Status: AC
Start: 2014-11-24 — End: 2014-11-24
  Filled 2014-11-24: qty 5

## 2014-11-24 MED ORDER — LIDOCAINE HCL (CARDIAC) 20 MG/ML IV SOLN
INTRAVENOUS | Status: AC
  Filled 2014-11-24: qty 5

## 2014-11-24 MED ORDER — DEXTROSE 5 % IV SOLN
1.5000 g | INTRAVENOUS | Status: AC
  Administered 2014-11-24: 1.5 g via INTRAVENOUS
  Filled 2014-11-24: qty 1.5

## 2014-11-24 MED ORDER — OXYCODONE HCL 5 MG/5ML PO SOLN: 5.0000 mg | Freq: Once | ORAL | Status: DC | PRN

## 2014-11-24 MED ORDER — SODIUM CHLORIDE 0.9 % IR SOLN
Status: DC | PRN
  Administered 2014-11-24: 10:00:00

## 2014-11-24 MED ORDER — TRAMADOL-ACETAMINOPHEN 37.5-325 MG PO TABS: 1.0000 | ORAL_TABLET | Freq: Three times a day (TID) | ORAL | Status: DC

## 2014-11-24 MED ORDER — PROTAMINE SULFATE 10 MG/ML IV SOLN
INTRAVENOUS | Status: DC | PRN
  Administered 2014-11-24: 30 mg via INTRAVENOUS

## 2014-11-24 MED ORDER — FENTANYL CITRATE (PF) 250 MCG/5ML IJ SOLN
INTRAMUSCULAR | Status: AC
  Filled 2014-11-24: qty 5

## 2014-11-24 MED ORDER — ONDANSETRON HCL 4 MG/2ML IJ SOLN
INTRAMUSCULAR | Status: AC
  Filled 2014-11-24: qty 4

## 2014-11-24 MED ORDER — HEPARIN SODIUM (PORCINE) 1000 UNIT/ML IJ SOLN
INTRAMUSCULAR | Status: AC
  Filled 2014-11-24: qty 1

## 2014-11-24 SURGICAL SUPPLY — 39 items
CANISTER SUCTION 2500CC (MISCELLANEOUS) ×3 IMPLANT
CLIP TI MEDIUM 6 (CLIP) ×3 IMPLANT
CLIP TI WIDE RED SMALL 6 (CLIP) ×3 IMPLANT
COVER PROBE W GEL 5X96 (DRAPES) IMPLANT
DECANTER SPIKE VIAL GLASS SM (MISCELLANEOUS) IMPLANT
ELECT REM PT RETURN 9FT ADLT (ELECTROSURGICAL) ×3
ELECTRODE REM PT RTRN 9FT ADLT (ELECTROSURGICAL) ×1 IMPLANT
GLOVE BIO SURGEON STRL SZ 6.5 (GLOVE) ×2 IMPLANT
GLOVE BIO SURGEON STRL SZ7 (GLOVE) ×3 IMPLANT
GLOVE BIO SURGEONS STRL SZ 6.5 (GLOVE) ×1
GLOVE BIOGEL PI IND STRL 6.5 (GLOVE) ×3 IMPLANT
GLOVE BIOGEL PI IND STRL 7.5 (GLOVE) ×1 IMPLANT
GLOVE BIOGEL PI IND STRL 8 (GLOVE) ×1 IMPLANT
GLOVE BIOGEL PI INDICATOR 6.5 (GLOVE) ×6
GLOVE BIOGEL PI INDICATOR 7.5 (GLOVE) ×2
GLOVE BIOGEL PI INDICATOR 8 (GLOVE) ×2
GOWN STRL REUS W/ TWL LRG LVL3 (GOWN DISPOSABLE) ×2 IMPLANT
GOWN STRL REUS W/ TWL XL LVL3 (GOWN DISPOSABLE) ×2 IMPLANT
GOWN STRL REUS W/TWL LRG LVL3 (GOWN DISPOSABLE) ×4
GOWN STRL REUS W/TWL XL LVL3 (GOWN DISPOSABLE) ×4
GRAFT GORETEX STND 6X20 (Vascular Products) ×3 IMPLANT
GRAFT GORETEXSTD 6X20 (Vascular Products) ×1 IMPLANT
KIT BASIN OR (CUSTOM PROCEDURE TRAY) ×3 IMPLANT
KIT ROOM TURNOVER OR (KITS) ×3 IMPLANT
LIQUID BAND (GAUZE/BANDAGES/DRESSINGS) ×3 IMPLANT
NEEDLE HYPO 25GX1X1/2 BEV (NEEDLE) IMPLANT
NS IRRIG 1000ML POUR BTL (IV SOLUTION) ×3 IMPLANT
PACK CV ACCESS (CUSTOM PROCEDURE TRAY) ×3 IMPLANT
PAD ARMBOARD 7.5X6 YLW CONV (MISCELLANEOUS) ×6 IMPLANT
SPONGE SURGIFOAM ABS GEL 100 (HEMOSTASIS) IMPLANT
SUT GORETEX 6.0 TT13 (SUTURE) ×9 IMPLANT
SUT MNCRL AB 4-0 PS2 18 (SUTURE) ×6 IMPLANT
SUT PROLENE 6 0 BV (SUTURE) ×6 IMPLANT
SUT PROLENE 7 0 BV 1 (SUTURE) IMPLANT
SUT VIC AB 3-0 SH 27 (SUTURE) ×4
SUT VIC AB 3-0 SH 27X BRD (SUTURE) ×2 IMPLANT
TOWEL OR 17X24 6PK STRL BLUE (TOWEL DISPOSABLE) ×3 IMPLANT
UNDERPAD 30X30 INCONTINENT (UNDERPADS AND DIAPERS) ×3 IMPLANT
WATER STERILE IRR 1000ML POUR (IV SOLUTION) IMPLANT

## 2014-11-24 NOTE — Anesthesia Postprocedure Evaluation (Signed)
  Anesthesia Post-op Note  Patient: [name]  Procedure(s) Performed: Procedure(s): REPLACEMENT OF  MEDIAL SIDE OF RIGHT FORARM ARTERIOVENOUS GORETEX GRAFT (Right)  Patient Location: PACU  Anesthesia Type:MAC  Level of Consciousness: awake  Airway and Oxygen Therapy: Patient Spontanous Breathing  Post-op Pain: none  Post-op Assessment: Post-op Vital signs reviewed, Patient's Cardiovascular Status Stable, Respiratory Function Stable, Patent Airway, No signs of Nausea or vomiting and Pain level controlled              Post-op Vital Signs: Reviewed and stable  Last Vitals:  Filed Vitals:   11/24/14 1234  BP: 138/42  Pulse: 50  Temp:   Resp: 14    Complications: No apparent anesthesia complications

## 2014-11-24 NOTE — Telephone Encounter (Signed)
-----   Message from Mena Goes, RN sent at 11/24/2014 12:44 PM EDT ----- Regarding: Schedule   ----- Message -----    From: Alvia Grove, PA-C    Sent: 11/24/2014  11:37 AM      To: [facility] Charge Pool  S/p replacement of medial side right arm AVG 11/24/14  F/u with Dr. Bridgett Larsson in 4 weeks  Thanks Maudie Mercury

## 2014-11-24 NOTE — Anesthesia Preprocedure Evaluation (Signed)
Anesthesia Evaluation  Patient identified by MRN, date of birth, ID band Patient awake    Reviewed: Allergy & Precautions, NPO status , Patient's Chart, lab work & pertinent test results  History of Anesthesia Complications Negative for: history of anesthetic complications  Airway Mallampati: II  TM Distance: >3 FB Neck ROM: Full    Dental  (+) Missing,    Pulmonary sleep apnea , neg COPD breath sounds clear to auscultation        Cardiovascular hypertension, Rhythm:Regular     Neuro/Psych TIAnegative psych ROS   GI/Hepatic negative GI ROS, Neg liver ROS,   Endo/Other  negative endocrine ROS  Renal/GU ESRF and DialysisRenal disease     Musculoskeletal  (+) Arthritis -,   Abdominal   Peds  Hematology negative hematology ROS (+)   Anesthesia Other Findings   Reproductive/Obstetrics                             Anesthesia Physical Anesthesia Plan  ASA: III  Anesthesia Plan: MAC   Post-op Pain Management:    Induction: Intravenous  Airway Management Planned: Simple Face Mask  Additional Equipment: None  Intra-op Plan:   Post-operative Plan:   Informed Consent: I have reviewed the patients History and Physical, chart, labs and discussed the procedure including the risks, benefits and alternatives for the proposed anesthesia with the patient or authorized representative who has indicated his/her understanding and acceptance.   Dental advisory given  Plan Discussed with: CRNA and Surgeon  Anesthesia Plan Comments:         Anesthesia Quick Evaluation

## 2014-11-24 NOTE — Anesthesia Procedure Notes (Signed)
Procedure Name: MAC Date/Time: 11/24/2014 10:05 AM Performed by: Jacquiline Doe A Pre-anesthesia Checklist: Patient identified, Emergency Drugs available, Suction available, Patient being monitored and Timeout performed Patient Re-evaluated:Patient Re-evaluated prior to inductionOxygen Delivery Method: Nasal cannula Intubation Type: IV induction Placement Confirmation: positive ETCO2 Dental Injury: Teeth and Oropharynx as per pre-operative assessment

## 2014-11-24 NOTE — Telephone Encounter (Signed)
LM for pt re appt, dpm °

## 2014-11-24 NOTE — Op Note (Signed)
    OPERATIVE NOTE   PROCEDURE: Revision of right forearm looped graft with interposition graft  PRE-OPERATIVE DIAGNOSIS: Imminent rupture of right forearm arteriovenous graft pseudoaneurysm  POST-OPERATIVE DIAGNOSIS: same as above   SURGEON: Adele Barthel, MD  ASSISTANT(S): Silva Bandy, PAC   ANESTHESIA: local and MAC  ESTIMATED BLOOD LOSS: 100 cc  FINDING(S): 1.  Degenerative graft with some fatty plaque deposited  2.  Palpable thrill at end of case  SPECIMEN(S):  none  INDICATIONS:   [name] is a 81 y.o. female who presents with worrisome ulcer overlying pseudoaneurysm in arterial limb of right forearm arteriovenous graft.  Dr. Trula Slade felt that revision was necessary to avoid bleeding.  Risk, benefits, and alternatives to access surgery were discussed.  The patient is aware the risks include but are not limited to: bleeding, infection, steal syndrome, nerve damage, ischemic monomelic neuropathy, failure to mature, need for additional procedures, death and stroke.  The patient agrees to proceed forward with the procedure.   DESCRIPTION: After obtaining full informed written consent, the patient was brought back to the operating room and placed supine upon the operating table.  The patient received IV antibiotics prior to induction.  After obtaining adequate anesthesia, the patient was prepped and draped in the standard fashion for: right arm access.  I turned my attention to the apex and proximal aspect the arterial limb of this graft.  I injected a total of 30 cc of a 1:1 mixture of lidocaine with epinephrine and marcaine without epinephrine at these two location and medial to the arterial limb of the graft.  I made incision at the apex and proximal graft.  I dissected out the graft at both locations.  I gave the patient 5000 units of Heparin intravenously.  After three minutes, I clamped the graft proximally and distally.  I transected the old graft in both exposures.  I then  tunneled a new 6 mm graft medial to the prior arterial limb.  I spatulated the new and old graft in both exposures and sewed the new graft to the old graft with a running stitch of CV-6.  Prior to completing the distal anastomosis, I bled the venous and arterial ends.  There was no thrombus present.  I completed these anastomoses in the usual fashion.  There was a weak thrill present in the graft with a pulsatile character to the graft, similar to preoperative status.  All wounds were washed out and packed with thrombin and gelfoam.  The patient was given 30 mg of Protamine to reverse anticoagulation.  After waiting a few minutes, the incisions were washed out.  The incisions were closed with a layer of 3-0 Vicryl in the subcutaneous tissue.  The skin was reapproximated with a running subcuticular stitch of 4-0 Monocryl.  The skin was cleaned, dried, and reinforced with Dermabond.  Prior to completing this procedure.  I re-examined the ulcerated segment of skin.  It appeared to be superficial and in the process of healing already, so I elected to not cut out the ulcer.  COMPLICATIONS: none  CONDITION: stable  Adele Barthel, MD [facility] Office: [phone] Pager: [phone]  11/24/2014, 11:40 AM

## 2014-11-24 NOTE — Transfer of Care (Signed)
Immediate Anesthesia Transfer of Care Note  Patient: [name]  Procedure(s) Performed: Procedure(s): REPLACEMENT OF  MEDIAL SIDE OF RIGHT FORARM ARTERIOVENOUS GORETEX GRAFT (Right)  Patient Location: PACU  Anesthesia Type:MAC  Level of Consciousness: awake, oriented, patient cooperative and responds to stimulation  Airway & Oxygen Therapy: Patient Spontanous Breathing  Post-op Assessment: Report given to RN, Post -op Vital signs reviewed and stable, Patient moving all extremities and Patient moving all extremities X 4  Post vital signs: Reviewed and stable  Last Vitals:  Filed Vitals:   11/24/14 1145  BP: 142/51  Pulse: 58  Temp:   Resp: 15    Complications: No apparent anesthesia complications

## 2014-11-24 NOTE — Interval H&P Note (Signed)
History and Physical Interval Note:  11/24/2014 7:29 AM  [place]  has presented today for surgery, with the diagnosis of End Stage Renal Disease N18.6  The various methods of treatment have been discussed with the patient and family. After consideration of risks, benefits and other options for treatment, the patient has consented to  Procedure(s): REPLACE MEDIAL SIDE OF ARTERIOVENOUS GORETEX GRAFT (Right) as a surgical intervention .  The patient's history has been reviewed, patient examined, no change in status, stable for surgery.  I have reviewed the patient's chart and labs.  Questions were answered to the patient's satisfaction.     Adele Barthel

## 2014-11-24 NOTE — H&P (View-Only) (Signed)
Patient name: [name] MRN: [id number] DOB: November 11, 1921 Sex: female     Chief Complaint  Patient presents with  . New Evaluation    ulcerations to right AVG  referred by Dr Marval Regal    HISTORY OF PRESENT ILLNESS: This is a very pleasant 81 year old female who is referred for ulcerations on her right forearm graft.  The patient states that these have been present for 1-2 weeks.  She has not been having any problems with bleeding or cannulation during dialysis.  She denies any signs of infection.  She is on dialysis Tuesday Thursday Saturday.  The patient's renal failure is secondary to hypertension.  Her hypercholesterolemia is managed with a statin.  She denies any coronary disease.  Past Medical History  Diagnosis Date  . HTN (hypertension)   . ESRD on hemodialysis   . Mild aortic stenosis   . Dyslipidemia   . TIA (transient ischemic attack)   . Gout   . Benign bladder mass   . DJD (degenerative joint disease)   . Anemia   . Obstructive sleep apnea     mild  . Chronic cough     Past Surgical History  Procedure Laterality Date  . Nephrectomy      left  . Total abdominal hysterectomy    . Bone marrow biopsy    . Left arm dialysis graft      History   Social History  . Marital Status: Widowed    Spouse Name: N/A  . Number of Children: N/A  . Years of Education: N/A   Occupational History  . Retired     Secretary/administrator   Social History Main Topics  . Smoking status: Never Smoker   . Smokeless tobacco: Never Used  . Alcohol Use: No  . Drug Use: No  . Sexual Activity: No   Other Topics Concern  . Not on file   Social History Narrative    Family History  Problem Relation Age of Onset  . Hypertension Mother   . Diabetes Mother     Allergies as of 11/21/2014 - Review Complete 11/21/2014  Allergen Reaction Noted  . Sulfa antibiotics  07/21/2010  . Sulfonamide derivatives  02/06/2008    Current Outpatient Prescriptions on File Prior to  Visit  Medication Sig Dispense Refill  . acetaminophen (TYLENOL) 500 MG tablet Take 500 mg by mouth every 6 (six) hours as needed (pain).    Marland Kitchen albuterol (PROAIR HFA) 108 (90 BASE) MCG/ACT inhaler 1-2 puffs every 4-6 hours as needed     . amLODipine (NORVASC) 10 MG tablet Take 0.5 tablets (5 mg total) by mouth daily. (Patient not taking: Reported on 11/21/2014)    . aspirin 81 MG tablet Take 81 mg by mouth daily.      Marland Kitchen atorvastatin (LIPITOR) 10 MG tablet Take 10 mg by mouth at bedtime.      . calcium acetate (PHOSLO) 667 MG capsule Take 2,001 mg by mouth 3 (three) times daily with meals.    . Multiple Vitamin (MULTIVITAMIN) tablet Take 1 tablet by mouth daily.      Marland Kitchen oseltamivir (TAMIFLU) 30 MG capsule Take 1 capsule (30 mg total) by mouth Every Tuesday,Thursday,and Saturday with dialysis. 3 capsule 0  . SENSIPAR 30 MG tablet Take 1 tablet by mouth daily.    . traMADol-acetaminophen (ULTRACET) 37.5-325 MG per tablet Take 1 tablet by mouth 3 (three) times daily.    . [DISCONTINUED] chlorpheniramine (CHLOR-TRIMETON) 4 MG tablet One pill at  night as needed for cough 30 tablet 0   No current facility-administered medications on file prior to visit.     REVIEW OF SYSTEMS: Cardiovascular: No chest pain, chest pressure, palpitations, orthopnea, or dyspnea on exertion. No claudication or rest pain,  No history of DVT or phlebitis. Pulmonary: No productive cough, asthma or wheezing. Neurologic: No weakness, paresthesias, aphasia, or amaurosis. No dizziness. Hematologic: No bleeding problems or clotting disorders. Musculoskeletal: No joint pain or joint swelling. Gastrointestinal: No blood in stool or hematemesis Genitourinary: No dysuria or hematuria. Psychiatric:: No history of major depression. Integumentary: No rashes or ulcers. Constitutional: No fever or chills.  PHYSICAL EXAMINATION:   Vital signs are  Filed Vitals:   11/21/14 0942 11/21/14 0943  BP: 157/64 141/67  Pulse: 79 66  Resp:  16   Height: 5' 3" (1.6 m)   Weight: 146 lb (66.225 kg)    Body mass index is 25.87 kg/(m^2). General: The patient appears their stated age. HEENT:  No gross abnormalities Pulmonary:  Non labored breathing Abdomen: Soft and non-tender Musculoskeletal: There are no major deformities. Neurologic: No focal weakness or paresthesias are detected, Skin: There are no ulcer or rashes noted. Psychiatric: The patient has normal affect. Cardiovascular: Palpable thrill within right forearm graft.  Aneurysmal changes are noted on the medial and lateral aspects of the graft.  The medial side is more pronounced.  Over top of the aneurysm there is a dry eschar.   Diagnostic Studies None  Assessment: End-stage renal disease Plan: The patient has aneurysmal degeneration as well as early ulceration on the medial side of her right forearm graft.  I have recommended replacement of the medial half of the graft to address both the aneurysm as well as the early ulcer.  She is willing to proceed.  I feel that this needs to be done sooner rather than later given the eschar over top of the aneurysm, to prevent a more emergent situation.  This will need to be done on a nondialysis day.  I have scheduled this for Dr. Chen this coming Monday.  V. Wells Brabham IV, M.D. [facility] Office: [phone] Pager:  [phone]   

## 2014-11-25 ENCOUNTER — Encounter (HOSPITAL_COMMUNITY): Payer: Self-pay | Admitting: Vascular Surgery

## 2014-12-25 ENCOUNTER — Encounter: Payer: Self-pay | Admitting: Vascular Surgery

## 2014-12-26 ENCOUNTER — Encounter: Payer: Self-pay | Admitting: Vascular Surgery

## 2014-12-26 ENCOUNTER — Ambulatory Visit (INDEPENDENT_AMBULATORY_CARE_PROVIDER_SITE_OTHER): Payer: Medicare Other | Admitting: Vascular Surgery

## 2014-12-26 VITALS — BP 126/51 | HR 76 | Temp 98.3°F | Resp 18 | Ht 63.0 in | Wt 146.0 lb

## 2014-12-26 DIAGNOSIS — N186 End stage renal disease: Secondary | ICD-10-CM

## 2014-12-26 NOTE — Progress Notes (Signed)
    Postoperative Access Visit   History of Present Illness  [name] is a 81 y.o. year old female who presents for postoperative follow-up for: Revision of R FA AVG (Date: 11/24/14).  The patient's wounds are healed.  The patient notes no steal symptoms.  The patient is able to complete their activities of daily living.  The patient's current symptoms are: none.  For VQI Use Only  PRE-ADM LIVING: Home  AMB STATUS: Ambulatory  Physical Examination Filed Vitals:   12/26/14 1141  BP: 126/51  Pulse: 76  Temp: 98.3 F (36.8 C)  Resp: 18    RUE: Incisions are healed, skin feels warm, hand grip is 5/5, sensation in digits is intact, palpable thrill, bruit can be auscultated   Medical Decision Making  [name] is a 81 y.o. year old female who presents s/p revision of R FA AVG.  The entire tract of the graft can now be used.  The new interposition segment is  MEDIAL to the old graft.  Thank you for allowing [place] to participate in this patient's care.  Adele Barthel, MD [facility] [place] Office: [phone] Pager: [phone]  12/26/2014, 12:20 PM

## 2015-11-27 ENCOUNTER — Inpatient Hospital Stay (HOSPITAL_COMMUNITY)
Admission: EM | Admit: 2015-11-27 | Discharge: 2015-11-30 | DRG: 252 | Disposition: A | Discharge status: Home / Self Care | Payer: Medicare Other | Attending: Internal Medicine | Admitting: Internal Medicine

## 2015-11-27 ENCOUNTER — Encounter (HOSPITAL_COMMUNITY): Payer: Self-pay | Admitting: Internal Medicine

## 2015-11-27 ENCOUNTER — Emergency Department (HOSPITAL_COMMUNITY): Payer: Medicare Other

## 2015-11-27 DIAGNOSIS — I350 Nonrheumatic aortic (valve) stenosis: Secondary | ICD-10-CM | POA: Diagnosis present

## 2015-11-27 DIAGNOSIS — B9561 Methicillin susceptible Staphylococcus aureus infection as the cause of diseases classified elsewhere: Secondary | ICD-10-CM | POA: Diagnosis present

## 2015-11-27 DIAGNOSIS — N329 Bladder disorder, unspecified: Secondary | ICD-10-CM | POA: Diagnosis present

## 2015-11-27 DIAGNOSIS — R05 Cough: Secondary | ICD-10-CM | POA: Diagnosis present

## 2015-11-27 DIAGNOSIS — Z905 Acquired absence of kidney: Secondary | ICD-10-CM

## 2015-11-27 DIAGNOSIS — R509 Fever, unspecified: Secondary | ICD-10-CM | POA: Diagnosis present

## 2015-11-27 DIAGNOSIS — R110 Nausea: Secondary | ICD-10-CM | POA: Diagnosis not present

## 2015-11-27 DIAGNOSIS — M109 Gout, unspecified: Secondary | ICD-10-CM | POA: Diagnosis present

## 2015-11-27 DIAGNOSIS — T82838A Hemorrhage of vascular prosthetic devices, implants and grafts, initial encounter: Secondary | ICD-10-CM | POA: Diagnosis not present

## 2015-11-27 DIAGNOSIS — N186 End stage renal disease: Secondary | ICD-10-CM | POA: Diagnosis present

## 2015-11-27 DIAGNOSIS — I272 Other secondary pulmonary hypertension: Secondary | ICD-10-CM | POA: Diagnosis present

## 2015-11-27 DIAGNOSIS — Z8249 Family history of ischemic heart disease and other diseases of the circulatory system: Secondary | ICD-10-CM

## 2015-11-27 DIAGNOSIS — E785 Hyperlipidemia, unspecified: Secondary | ICD-10-CM | POA: Diagnosis present

## 2015-11-27 DIAGNOSIS — Z881 Allergy status to other antibiotic agents status: Secondary | ICD-10-CM

## 2015-11-27 DIAGNOSIS — G4733 Obstructive sleep apnea (adult) (pediatric): Secondary | ICD-10-CM | POA: Diagnosis present

## 2015-11-27 DIAGNOSIS — Z91048 Other nonmedicinal substance allergy status: Secondary | ICD-10-CM

## 2015-11-27 DIAGNOSIS — R58 Hemorrhage, not elsewhere classified: Secondary | ICD-10-CM | POA: Diagnosis present

## 2015-11-27 DIAGNOSIS — I120 Hypertensive chronic kidney disease with stage 5 chronic kidney disease or end stage renal disease: Secondary | ICD-10-CM | POA: Diagnosis present

## 2015-11-27 DIAGNOSIS — Z8673 Personal history of transient ischemic attack (TIA), and cerebral infarction without residual deficits: Secondary | ICD-10-CM

## 2015-11-27 DIAGNOSIS — D696 Thrombocytopenia, unspecified: Secondary | ICD-10-CM | POA: Diagnosis present

## 2015-11-27 DIAGNOSIS — Y841 Kidney dialysis as the cause of abnormal reaction of the patient, or of later complication, without mention of misadventure at the time of the procedure: Secondary | ICD-10-CM | POA: Diagnosis present

## 2015-11-27 DIAGNOSIS — Z992 Dependence on renal dialysis: Secondary | ICD-10-CM

## 2015-11-27 DIAGNOSIS — E7800 Pure hypercholesterolemia, unspecified: Secondary | ICD-10-CM | POA: Diagnosis present

## 2015-11-27 DIAGNOSIS — Z79899 Other long term (current) drug therapy: Secondary | ICD-10-CM

## 2015-11-27 DIAGNOSIS — R7881 Bacteremia: Secondary | ICD-10-CM | POA: Diagnosis present

## 2015-11-27 DIAGNOSIS — Z7982 Long term (current) use of aspirin: Secondary | ICD-10-CM

## 2015-11-27 LAB — COMPREHENSIVE METABOLIC PANEL
ALT: 20 U/L (ref 14–54)
ANION GAP: 13 (ref 5–15)
AST: 23 U/L (ref 15–41)
Albumin: 4.1 g/dL (ref 3.5–5.0)
Alkaline Phosphatase: 92 U/L (ref 38–126)
BUN: 26 mg/dL — ABNORMAL HIGH (ref 6–20)
CHLORIDE: 96 mmol/L — AB (ref 101–111)
CO2: 27 mmol/L (ref 22–32)
Calcium: 10.2 mg/dL (ref 8.9–10.3)
Creatinine, Ser: 5.58 mg/dL — ABNORMAL HIGH (ref 0.44–1.00)
GFR calc Af Amer: 7 mL/min — ABNORMAL LOW (ref >60)
GFR calc non Af Amer: 6 mL/min — ABNORMAL LOW (ref >60)
Glucose, Bld: 109 mg/dL — ABNORMAL HIGH (ref 65–99)
Potassium: 5.1 mmol/L (ref 3.5–5.1)
SODIUM: 136 mmol/L (ref 135–145)
Total Bilirubin: 0.5 mg/dL (ref 0.3–1.2)
Total Protein: 6.5 g/dL (ref 6.5–8.1)

## 2015-11-27 LAB — CBC WITH DIFFERENTIAL/PLATELET
BASOS PCT: 0 %
Basophils Absolute: 0 10*3/uL (ref 0.0–0.1)
Eosinophils Absolute: 0 10*3/uL (ref 0.0–0.7)
Eosinophils Relative: 0 %
HEMATOCRIT: 38.9 % (ref 36.0–46.0)
HEMOGLOBIN: 12.8 g/dL (ref 12.0–15.0)
LYMPHS ABS: 0.4 10*3/uL — AB (ref 0.7–4.0)
Lymphocytes Relative: 6 %
MCH: 31.4 pg (ref 26.0–34.0)
MCHC: 32.9 g/dL (ref 30.0–36.0)
MCV: 95.6 fL (ref 78.0–100.0)
MONO ABS: 0.3 10*3/uL (ref 0.1–1.0)
MONOS PCT: 5 %
NEUTROS ABS: 5.4 10*3/uL (ref 1.7–7.7)
NEUTROS PCT: 89 %
Platelets: 105 10*3/uL — ABNORMAL LOW (ref 150–400)
RBC: 4.07 MIL/uL (ref 3.87–5.11)
RDW: 15.1 % (ref 11.5–15.5)
WBC: 6.1 10*3/uL (ref 4.0–10.5)

## 2015-11-27 LAB — TYPE AND SCREEN
ABO/RH(D): A POS
Antibody Screen: NEGATIVE

## 2015-11-27 LAB — CBC
HCT: 33.7 % — ABNORMAL LOW (ref 36.0–46.0)
Hemoglobin: 10.9 g/dL — ABNORMAL LOW (ref 12.0–15.0)
MCH: 31.1 pg (ref 26.0–34.0)
MCHC: 32.3 g/dL (ref 30.0–36.0)
MCV: 96 fL (ref 78.0–100.0)
PLATELETS: 99 10*3/uL — AB (ref 150–400)
RBC: 3.51 MIL/uL — AB (ref 3.87–5.11)
RDW: 15.1 % (ref 11.5–15.5)
WBC: 5.1 10*3/uL (ref 4.0–10.5)

## 2015-11-27 LAB — GLUCOSE, CAPILLARY: Glucose-Capillary: 96 mg/dL (ref 65–99)

## 2015-11-27 LAB — I-STAT CG4 LACTIC ACID, ED: LACTIC ACID, VENOUS: 1.19 mmol/L (ref 0.5–1.9)

## 2015-11-27 LAB — MRSA PCR SCREENING: MRSA by PCR: NEGATIVE

## 2015-11-27 MED ORDER — CALCIUM ACETATE (PHOS BINDER) 667 MG PO CAPS
2001.0000 mg | ORAL_CAPSULE | Freq: Three times a day (TID) | ORAL | Status: DC
  Filled 2015-11-27 (×4): qty 3

## 2015-11-27 MED ORDER — PIPERACILLIN-TAZOBACTAM 3.375 G IVPB 30 MIN
3.3750 g | Freq: Once | INTRAVENOUS | Status: AC
  Administered 2015-11-27: 3.375 g via INTRAVENOUS
  Filled 2015-11-27: qty 50

## 2015-11-27 MED ORDER — ADULT MULTIVITAMIN W/MINERALS CH
1.0000 | ORAL_TABLET | Freq: Every day | ORAL | Status: DC
  Administered 2015-11-29: 1 via ORAL
  Filled 2015-11-27 (×2): qty 1

## 2015-11-27 MED ORDER — SODIUM CHLORIDE 0.9 % IV SOLN
1000.0000 mL | INTRAVENOUS | Status: DC
  Administered 2015-11-27: 1000 mL via INTRAVENOUS

## 2015-11-27 MED ORDER — ACETAMINOPHEN 500 MG PO TABS
1000.0000 mg | ORAL_TABLET | Freq: Once | ORAL | Status: AC
Start: 2015-11-27 — End: 2015-11-27
  Administered 2015-11-27: 1000 mg via ORAL
  Filled 2015-11-27: qty 2

## 2015-11-27 MED ORDER — ONDANSETRON HCL 4 MG/2ML IJ SOLN
4.0000 mg | Freq: Four times a day (QID) | INTRAMUSCULAR | Status: DC | PRN
  Administered 2015-11-28: 4 mg via INTRAVENOUS
  Filled 2015-11-27: qty 2

## 2015-11-27 MED ORDER — PIPERACILLIN-TAZOBACTAM IN DEX 2-0.25 GM/50ML IV SOLN
2.2500 g | Freq: Three times a day (TID) | INTRAVENOUS | Status: DC
  Administered 2015-11-28: 2.25 g via INTRAVENOUS
  Filled 2015-11-27 (×2): qty 50

## 2015-11-27 MED ORDER — VANCOMYCIN HCL 10 G IV SOLR
1250.0000 mg | Freq: Once | INTRAVENOUS | Status: AC
  Administered 2015-11-27: 1250 mg via INTRAVENOUS
  Filled 2015-11-27: qty 1250

## 2015-11-27 MED ORDER — ATORVASTATIN CALCIUM 10 MG PO TABS
10.0000 mg | ORAL_TABLET | Freq: Every day | ORAL | Status: DC
  Administered 2015-11-27 – 2015-11-29 (×3): 10 mg via ORAL
  Filled 2015-11-27 (×3): qty 1

## 2015-11-27 MED ORDER — ONDANSETRON HCL 4 MG PO TABS: 4.0000 mg | ORAL_TABLET | Freq: Four times a day (QID) | ORAL | Status: DC | PRN

## 2015-11-27 MED ORDER — ACETAMINOPHEN 650 MG RE SUPP: 650.0000 mg | Freq: Four times a day (QID) | RECTAL | Status: DC | PRN

## 2015-11-27 MED ORDER — CINACALCET HCL 30 MG PO TABS
30.0000 mg | ORAL_TABLET | Freq: Every day | ORAL | Status: DC
  Administered 2015-11-29: 30 mg via ORAL
  Filled 2015-11-27 (×2): qty 1

## 2015-11-27 MED ORDER — VANCOMYCIN HCL 500 MG IV SOLR
500.0000 mg | INTRAVENOUS | Status: DC
  Filled 2015-11-27: qty 500

## 2015-11-27 MED ORDER — ACETAMINOPHEN 325 MG PO TABS
650.0000 mg | ORAL_TABLET | Freq: Four times a day (QID) | ORAL | Status: DC | PRN
  Administered 2015-11-27 – 2015-11-28 (×3): 650 mg via ORAL
  Filled 2015-11-27 (×3): qty 2

## 2015-11-27 NOTE — Progress Notes (Signed)
Admission note:  Arrival Method: Pt arrived on stretcher from [facility] and oriented x 4 Telemetry: Telemetry box 6e06 applied. CCMT notified.  Assessment: Completed, see flowsheets Skin: Dry and intact  IV: Left forearm. Site is clean, dry and intact Pain: Pt states pain in left shoulder. Heat packs applied.  Tubes: Right forearm fistula. Site was bleeding and wrapped in coban. No new blood at this time.  Safety Measures: Bed in lowest position, non-slip socks placed, call light within reach, bed alarm activated Fall Prevention Safety Plan: Reviewed with patient Admission Screening: Completed 6700 Orientation: Patient has been oriented to the unit, staff and to the room. Orders have been reviewed and implemented. Call light within reach, will continue to monitor.   Shelbie Hutching, RN, BSN

## 2015-11-27 NOTE — ED Notes (Signed)
Per EMS - pt had dialysis yesterday. Pt woke up this morning and noted blood running down right arm and right leg from right forearm graft. Denies dizziness. Pt does c/o weakness.

## 2015-11-27 NOTE — ED Notes (Signed)
Pt transported to xray 

## 2015-11-27 NOTE — ED Notes (Signed)
MD called and ordered fluids be stopped, fluids were stopped.

## 2015-11-27 NOTE — ED Notes (Signed)
Pt called out and stated that graft site was bleeding through the gauze that was put in place earlier. This RN removed gauze, cleaned site and placed another 4x4 gauze, covered by an abd pad and wrapped in coban. Bleeding controlled.

## 2015-11-27 NOTE — H&P (Signed)
History and Physical    [name] DOB: [birth year] DOA: 11/27/2015  PCP: Criselda Peaches, MD  Patient coming from: Home.  Chief Complaint: Right AV graft bleeding.  HPI: [name] is a 82 y.o. female with ESRD on hemodialysis on Tuesday Thursday and Saturday started noticing bleeding from the right AV graft since this morning. Patient experienced at least 3 episodes of bleeding. In the ER patient was found to be mildly febrile. Patient has chronic cough. Patient had another episode of bleeding from the AV graft for which patient had dressing placed and presently bleeding is contained. ER physician did consult on-call vascular surgeon who will be seeing patient in consult. Since patient is mildly febrile blood cultures were obtained and patient was placed on empiric antibiotics. Patient is not in distress.   ED Course: Blood cultures were obtained and started on empiric antibiotics.  Review of Systems: As per HPI, rest all negative.   Past Medical History  Diagnosis Date  . HTN (hypertension)   . ESRD on hemodialysis ([place])   . Mild aortic stenosis   . Dyslipidemia   . TIA (transient ischemic attack)   . Gout   . Benign bladder mass   . DJD (degenerative joint disease)   . Anemia   . Obstructive sleep apnea     mild  . Chronic cough   . Heart murmur   . Pneumonia   . Orthostatic syncope     around year 2006    Past Surgical History  Procedure Laterality Date  . Nephrectomy      left  . Total abdominal hysterectomy    . Bone marrow biopsy    . Left arm dialysis graft    . Colonoscopy w/ biopsies and polypectomy    . Cataract extraction w/ intraocular lens  implant, bilateral    . Revision of arteriovenous goretex graft Right 11/24/2014    Procedure: REPLACEMENT OF  MEDIAL SIDE OF RIGHT FORARM ARTERIOVENOUS GORETEX GRAFT;  Surgeon: Conrad Pillager, MD;  Location: [facility];  Service: Vascular;  Laterality: Right;     reports that she has never  smoked. She has never used smokeless tobacco. She reports that she does not drink alcohol or use illicit drugs.  Allergies  Allergen Reactions  . Tape Other (See Comments)    Pulls off skin  . Sulfa Antibiotics Other (See Comments)    Bumps all over body    Family History  Problem Relation Age of Onset  . Hypertension Mother   . Diabetes Mother   . Hypertension Other     Prior to Admission medications   Medication Sig Start Date End Date Taking? Authorizing Provider  acetaminophen (TYLENOL) 500 MG tablet Take 1,000 mg by mouth every 6 (six) hours as needed (pain).    Yes Historical Provider, MD  aspirin 81 MG tablet Take 81 mg by mouth daily.     Yes Historical Provider, MD  atorvastatin (LIPITOR) 10 MG tablet Take 10 mg by mouth at bedtime.     Yes Historical Provider, MD  calcium acetate (PHOSLO) 667 MG capsule Take 2,001 mg by mouth 3 (three) times daily with meals.   Yes Historical Provider, MD  Multiple Vitamin (MULTIVITAMIN) tablet Take 1 tablet by mouth daily. Reported on 11/27/2015   Yes Historical Provider, MD  SENSIPAR 30 MG tablet Take 30 mg by mouth daily.  02/14/13  Yes Historical Provider, MD  traMADol-acetaminophen (ULTRACET) 37.5-325 MG per tablet Take 1 tablet  by mouth 3 (three) times daily. 11/24/14   Alvia Grove, PA-C    Physical Exam: Filed Vitals:   11/27/15 1830 11/27/15 1845 11/27/15 1915 11/27/15 1935  BP: 120/48 125/47 109/57   Pulse: 75 84 104   Temp:      TempSrc:      Resp: _0 Height:      Weight:      SpO2: 94% 98% 97% 94%      Constitutional: Not in distress. Filed Vitals:   11/27/15 1830 11/27/15 1845 11/27/15 1915 11/27/15 1935  BP: 120/48 125/47 109/57   Pulse: 75 84 104   Temp:      TempSrc:      Resp: _1 Height:      Weight:      SpO2: 94% 98% 97% 94%   Eyes: Anicteric no pallor. ENMT: No discharge from the ears eyes nose or mouth. Neck: No mass felt. No JVD appreciated. Respiratory: No rhonchi or  crepitations. Cardiovascular: S1-S2 heard. Abdomen: Soft nontender bowel sounds present. No guarding or rigidity. Musculoskeletal: Right upper extremity in dressing. Skin: No rash. Neurologic: Alert awake oriented to time place and person. Moves all extremities. Psychiatric: Appears normal.   Labs on Admission: I have personally reviewed following labs and imaging studies  CBC:  Recent Labs Lab 11/27/15 1555  WBC 6.1  NEUTROABS 5.4  HGB 12.8  HCT 38.9  MCV 95.6  PLT 503*   Basic Metabolic Panel:  Recent Labs Lab 11/27/15 1555  NA 136  K 5.1  CL 96*  CO2 27  GLUCOSE 109*  BUN 26*  CREATININE 5.58*  CALCIUM 10.2   GFR: Estimated Creatinine Clearance: 5.8 mL/min (by C-G formula based on Cr of 5.58). Liver Function Tests:  Recent Labs Lab 11/27/15 1555  AST 23  ALT 20  ALKPHOS 92  BILITOT 0.5  PROT 6.5  ALBUMIN 4.1   No results for input(s): LIPASE, AMYLASE in the last 168 hours. No results for input(s): AMMONIA in the last 168 hours. Coagulation Profile: No results for input(s): INR, PROTIME in the last 168 hours. Cardiac Enzymes: No results for input(s): CKTOTAL, CKMB, CKMBINDEX, TROPONINI in the last 168 hours. BNP (last 3 results) No results for input(s): PROBNP in the last 8760 hours. HbA1C: No results for input(s): HGBA1C in the last 72 hours. CBG: No results for input(s): GLUCAP in the last 168 hours. Lipid Profile: No results for input(s): CHOL, HDL, LDLCALC, TRIG, CHOLHDL, LDLDIRECT in the last 72 hours. Thyroid Function Tests: No results for input(s): TSH, T4TOTAL, FREET4, T3FREE, THYROIDAB in the last 72 hours. Anemia Panel: No results for input(s): VITAMINB12, FOLATE, FERRITIN, TIBC, IRON, RETICCTPCT in the last 72 hours. Urine analysis: No results found for: COLORURINE, APPEARANCEUR, LABSPEC, PHURINE, GLUCOSEU, HGBUR, BILIRUBINUR, KETONESUR, PROTEINUR, UROBILINOGEN, NITRITE, LEUKOCYTESUR Sepsis  Labs: _2 (procalcitonin:4,lacticidven:4) )No results found for this or any previous visit (from the past 240 hour(s)).   Radiological Exams on Admission: Dg Chest 2 View  11/27/2015  CLINICAL DATA:  Cough. EXAM: CHEST  2 VIEW COMPARISON:  None. FINDINGS: The heart, hila, and mediastinum are normal. No focal infiltrates or overt edema. No acute abnormalities identified. IMPRESSION: No active cardiopulmonary disease. Electronically Signed   By: Dorise Bullion III M.D   On: 11/27/2015 17:54     Assessment/Plan Principal Problem:   Hemorrhage of arteriovenous graft ([place]) Active Problems:   ESRD (end stage renal disease) ([place])   Fever in adult   HLD (  hyperlipidemia)   Bleeding    1. Right AV graft bleeding - hold aspirin and closely follow CBC. Vascular surgery to see in consult. Patient has been kept nothing by mouth in anticipation of procedure. 2. Fever - likely sows could be from the bleeding graft. Follow blood cultures. Patient is placed on empiric antibiotics for now. 3. ESRD on hemodialysis - Tuesday Thursday-Saturday. Consult nephrology for dialysis. 4. Hyperlipidemia on statins. 5. Thrombocytopenia - follow CBC.   DVT prophylaxis: SCDs. Code Status: Full code.  Family Communication: No family at the bedside.  Disposition Plan: Home.  Consults called: Vascular surgeon was consulted by the ER physician.  Admission status: Observation. Telemetry.    Rise Patience MD [facility] Pager [phone].  If 7PM-7AM, please contact night-coverage [email or web address] Password [facility]  11/27/2015, 8:08 PM

## 2015-11-27 NOTE — Progress Notes (Addendum)
Pharmacy Antibiotic Note  [name] is a 82 y.o. female admitted on 11/27/2015 with graft wound infection.  Pharmacy has been consulted for vancomycin and zosyn dosing. Pt has ESRD on HD TTS with last dialysis session 7/20.  Pt received vancomycin 1250mg  and zosyn 3.375g IV once in the ED.  Plan:  Vancomycin 500mg  IV qHD Zosyn 2.25g IV q8h Monitor culture data, HD plan and clinical course VT at SS (pre-HD target level 15-25 mcg/mL)  Height: 5\' 5"  (165.1 cm) Weight: 133 lb 2.5 oz (60.4 kg) IBW/kg (Calculated) : 57  Temp (24hrs), Avg:100.3 F (37.9 C), Min:99.9 F (37.7 C), Max:100.7 F (38.2 C)   Recent Labs Lab 11/27/15 1555 11/27/15 1613  WBC 6.1  --   CREATININE 5.58*  --   LATICACIDVEN  --  1.19    Estimated Creatinine Clearance: 5.8 mL/min (by C-G formula based on Cr of 5.58).    Allergies  Allergen Reactions  . Tape Other (See Comments)    Pulls off skin  . Sulfa Antibiotics Other (See Comments)    Bumps all over body    Antimicrobials this admission: Vanc 7/21 >>  Zosyn 7/21 >>   Dose adjustments this admission: n/a  Microbiology results: 7/21 BCx: sent  UCx:    Sputum:    MRSA PCR:   Andrey Cota. Diona Foley, PharmD, [place] Clinical Pharmacist Pager [phone] 11/27/2015 7:02 PM

## 2015-11-27 NOTE — ED Provider Notes (Signed)
CSN: [id number]     Arrival date & time 11/27/15  1546 History   First MD Initiated Contact with Patient 11/27/15 1550     Chief Complaint  Patient presents with  . Vascular Access Problem     (Consider location/radiation/quality/duration/timing/severity/associated sxs/prior Treatment) HPI Comments: 82 year old female with history of end-stage renal disease on dialysis Tuesday Thursday Saturday, last dialyzed without issues yesterday, chronic cough presents with intermittent bleeding from right arm fistula. Able to be controlled with pressure. No injuries. Patient had new fever discovered in the ER today. No fevers yesterday. Chronic cough. No other rashes or obvious infectious sources.  The history is provided by the patient, medical records and a relative.    Past Medical History  Diagnosis Date  . HTN (hypertension)   . ESRD on hemodialysis   . Mild aortic stenosis   . Dyslipidemia   . TIA (transient ischemic attack)   . Gout   . Benign bladder mass   . DJD (degenerative joint disease)   . Anemia   . Obstructive sleep apnea     mild  . Chronic cough   . Heart murmur   . Pneumonia   . Orthostatic syncope     around year 2006   Past Surgical History  Procedure Laterality Date  . Nephrectomy      left  . Total abdominal hysterectomy    . Bone marrow biopsy    . Left arm dialysis graft    . Colonoscopy w/ biopsies and polypectomy    . Cataract extraction w/ intraocular lens  implant, bilateral    . Revision of arteriovenous goretex graft Right 11/24/2014    Procedure: REPLACEMENT OF  MEDIAL SIDE OF RIGHT FORARM ARTERIOVENOUS GORETEX GRAFT;  Surgeon: Conrad [place], MD;  Location: [facility];  Service: Vascular;  Laterality: Right;   Family History  Problem Relation Age of Onset  . Hypertension Mother   . Diabetes Mother   . Hypertension Other    Social History  Substance Use Topics  . Smoking status: Never Smoker   . Smokeless tobacco: Never Used  . Alcohol Use: No    OB History    No data available     Review of Systems  Constitutional: Positive for appetite change. Negative for fever and chills.  HENT: Negative for congestion.   Eyes: Negative for visual disturbance.  Respiratory: Negative for shortness of breath.   Cardiovascular: Negative for chest pain.  Gastrointestinal: Negative for vomiting and abdominal pain.  Genitourinary: Negative for dysuria and flank pain.  Musculoskeletal: Negative for back pain, neck pain and neck stiffness.  Skin: Positive for wound. Negative for rash.  Neurological: Negative for light-headedness and headaches.      Allergies  Tape and Sulfa antibiotics  Home Medications   Prior to Admission medications   Medication Sig Start Date End Date Taking? Authorizing Provider  acetaminophen (TYLENOL) 500 MG tablet Take 1,000 mg by mouth every 6 (six) hours as needed (pain).    Yes Historical Provider, MD  aspirin 81 MG tablet Take 81 mg by mouth daily.     Yes Historical Provider, MD  atorvastatin (LIPITOR) 10 MG tablet Take 10 mg by mouth at bedtime.     Yes Historical Provider, MD  calcium acetate (PHOSLO) 667 MG capsule Take 2,001 mg by mouth 3 (three) times daily with meals.   Yes Historical Provider, MD  Multiple Vitamin (MULTIVITAMIN) tablet Take 1 tablet by mouth daily. Reported on 11/27/2015   Yes Historical Provider,  MD  SENSIPAR 30 MG tablet Take 30 mg by mouth daily.  02/14/13  Yes Historical Provider, MD  traMADol-acetaminophen (ULTRACET) 37.5-325 MG per tablet Take 1 tablet by mouth 3 (three) times daily. 11/24/14   Joelene Millin A Trinh, PA-C   BP 118/48 mmHg  Pulse 81  Temp(Src) 100.7 F (38.2 C) (Oral)  Resp 16  Ht 5' 5"  (1.651 m)  Wt 133 lb 2.5 oz (60.4 kg)  BMI 22.16 kg/m2  SpO2 94% Physical Exam  Constitutional: She is oriented to person, place, and time. She appears well-developed and well-nourished.  HENT:  Head: Normocephalic and atraumatic.  Eyes: Conjunctivae are normal. Right eye  exhibits no discharge. Left eye exhibits no discharge.  Neck: Normal range of motion. Neck supple. No tracheal deviation present.  Cardiovascular: Normal rate and regular rhythm.   Pulmonary/Chest: Effort normal and breath sounds normal.  Abdominal: Soft. She exhibits no distension. There is no tenderness. There is no guarding.  Musculoskeletal: She exhibits no edema.  Neurological: She is alert and oriented to person, place, and time.  Skin: Skin is warm. No rash noted.  Patient has bruit right forearm graft, no active bleeding, no signs of abscess, minimal erythema over graft site  Psychiatric: She has a normal mood and affect.  Nursing note and vitals reviewed.   ED Course  Procedures (including critical care time) Labs Review Labs Reviewed  COMPREHENSIVE METABOLIC PANEL - Abnormal; Notable for the following:    Chloride 96 (*)    Glucose, Bld 109 (*)    BUN 26 (*)    Creatinine, Ser 5.58 (*)    GFR calc non Af Amer 6 (*)    GFR calc Af Amer 7 (*)    All other components within normal limits  CBC WITH DIFFERENTIAL/PLATELET - Abnormal; Notable for the following:    Platelets 105 (*)    Lymphs Abs 0.4 (*)    All other components within normal limits  CULTURE, BLOOD (ROUTINE X 2)  CULTURE, BLOOD (ROUTINE X 2)  I-STAT CG4 LACTIC ACID, ED    Imaging Review Dg Chest 2 View  11/27/2015  CLINICAL DATA:  Cough. EXAM: CHEST  2 VIEW COMPARISON:  None. FINDINGS: The heart, hila, and mediastinum are normal. No focal infiltrates or overt edema. No acute abnormalities identified. IMPRESSION: No active cardiopulmonary disease. Electronically Signed   By: Dorise Bullion III M.D   On: 11/27/2015 17:54   I have personally reviewed and evaluated these images and lab results as part of my medical decision-making.   EKG Interpretation None      MDM   Final diagnoses:  Fever in adult  Bleeding from dialysis shunt, initial encounter ([place])  Nausea   Patient presented with nausea,  low-grade fever and intermittent bleeding per right forearm dialysis site. Bleeding controlled in ER. Blood work unremarkable except for known kidney disease. Tylenol given for fever chest x-ray unremarkable. Patient does not precede her in. Lactate normal. Patient does not clinically appear septic at this time. Blood cultures ordered with dialysis history  Patient had another episode of bleeding from the graft site that was controlled with pressure. With nausea fever and concern for early graft site infection land for vancomycin and Zosyn. Discussed with [facility], paged vascular surgery for consult.  The patients results and plan were reviewed and discussed.   Any x-rays performed were independently reviewed by myself.   Differential diagnosis were considered with the presenting HPI.  Medications  0.9 %  sodium chloride infusion (  1,000 mLs Intravenous New Bag/Given 11/27/15 1713)  acetaminophen (TYLENOL) tablet 1,000 mg (1,000 mg Oral Given 11/27/15 1710)    Filed Vitals:   11/27/15 1751 11/27/15 1810 11/27/15 1830 11/27/15 1845  BP: 118/48  120/48 125/47  Pulse: 81  75 84  Temp:  99.9 F (37.7 C)    TempSrc:  Oral    Resp: 16  20 18   Height:      Weight:      SpO2: 94%  94% 98%    Final diagnoses:  Fever in adult  Bleeding from dialysis shunt, initial encounter ([place])  Nausea    Admission/ observation were discussed with the admitting physician, patient and/or family and they are comfortable with the plan.     Elnora Morrison, MD 11/27/15 [phone]

## 2015-11-28 ENCOUNTER — Observation Stay (HOSPITAL_COMMUNITY): Payer: Medicare Other | Admitting: Certified Registered"

## 2015-11-28 ENCOUNTER — Encounter (HOSPITAL_COMMUNITY)
Admission: EM | Disposition: A | Discharge status: Home / Self Care | Payer: Self-pay | Source: Home / Self Care | Attending: Internal Medicine

## 2015-11-28 DIAGNOSIS — E785 Hyperlipidemia, unspecified: Secondary | ICD-10-CM | POA: Diagnosis present

## 2015-11-28 DIAGNOSIS — Z79899 Other long term (current) drug therapy: Secondary | ICD-10-CM | POA: Diagnosis not present

## 2015-11-28 DIAGNOSIS — Z91048 Other nonmedicinal substance allergy status: Secondary | ICD-10-CM | POA: Diagnosis not present

## 2015-11-28 DIAGNOSIS — T82838A Hemorrhage of vascular prosthetic devices, implants and grafts, initial encounter: Principal | ICD-10-CM

## 2015-11-28 DIAGNOSIS — Z8249 Family history of ischemic heart disease and other diseases of the circulatory system: Secondary | ICD-10-CM | POA: Diagnosis not present

## 2015-11-28 DIAGNOSIS — B9561 Methicillin susceptible Staphylococcus aureus infection as the cause of diseases classified elsewhere: Secondary | ICD-10-CM | POA: Diagnosis present

## 2015-11-28 DIAGNOSIS — Z905 Acquired absence of kidney: Secondary | ICD-10-CM | POA: Diagnosis not present

## 2015-11-28 DIAGNOSIS — G4733 Obstructive sleep apnea (adult) (pediatric): Secondary | ICD-10-CM | POA: Diagnosis present

## 2015-11-28 DIAGNOSIS — T82898A Other specified complication of vascular prosthetic devices, implants and grafts, initial encounter: Secondary | ICD-10-CM | POA: Diagnosis not present

## 2015-11-28 DIAGNOSIS — Y841 Kidney dialysis as the cause of abnormal reaction of the patient, or of later complication, without mention of misadventure at the time of the procedure: Secondary | ICD-10-CM | POA: Diagnosis present

## 2015-11-28 DIAGNOSIS — R7881 Bacteremia: Secondary | ICD-10-CM

## 2015-11-28 DIAGNOSIS — Z8673 Personal history of transient ischemic attack (TIA), and cerebral infarction without residual deficits: Secondary | ICD-10-CM | POA: Diagnosis not present

## 2015-11-28 DIAGNOSIS — M109 Gout, unspecified: Secondary | ICD-10-CM | POA: Diagnosis present

## 2015-11-28 DIAGNOSIS — I120 Hypertensive chronic kidney disease with stage 5 chronic kidney disease or end stage renal disease: Secondary | ICD-10-CM | POA: Diagnosis present

## 2015-11-28 DIAGNOSIS — E7800 Pure hypercholesterolemia, unspecified: Secondary | ICD-10-CM | POA: Diagnosis present

## 2015-11-28 DIAGNOSIS — R509 Fever, unspecified: Secondary | ICD-10-CM | POA: Diagnosis present

## 2015-11-28 DIAGNOSIS — N186 End stage renal disease: Secondary | ICD-10-CM | POA: Diagnosis present

## 2015-11-28 DIAGNOSIS — I350 Nonrheumatic aortic (valve) stenosis: Secondary | ICD-10-CM | POA: Diagnosis present

## 2015-11-28 DIAGNOSIS — D696 Thrombocytopenia, unspecified: Secondary | ICD-10-CM | POA: Diagnosis present

## 2015-11-28 DIAGNOSIS — R110 Nausea: Secondary | ICD-10-CM | POA: Diagnosis present

## 2015-11-28 DIAGNOSIS — T82838D Hemorrhage of vascular prosthetic devices, implants and grafts, subsequent encounter: Secondary | ICD-10-CM | POA: Diagnosis not present

## 2015-11-28 DIAGNOSIS — N329 Bladder disorder, unspecified: Secondary | ICD-10-CM | POA: Diagnosis present

## 2015-11-28 DIAGNOSIS — Z992 Dependence on renal dialysis: Secondary | ICD-10-CM | POA: Diagnosis not present

## 2015-11-28 DIAGNOSIS — Z881 Allergy status to other antibiotic agents status: Secondary | ICD-10-CM | POA: Diagnosis not present

## 2015-11-28 DIAGNOSIS — R05 Cough: Secondary | ICD-10-CM | POA: Diagnosis present

## 2015-11-28 DIAGNOSIS — I272 Other secondary pulmonary hypertension: Secondary | ICD-10-CM | POA: Diagnosis present

## 2015-11-28 DIAGNOSIS — Z7982 Long term (current) use of aspirin: Secondary | ICD-10-CM | POA: Diagnosis not present

## 2015-11-28 HISTORY — PX: REVISION OF ARTERIOVENOUS GORETEX GRAFT: SHX6073

## 2015-11-28 LAB — COMPREHENSIVE METABOLIC PANEL
ALBUMIN: 3.3 g/dL — AB (ref 3.5–5.0)
ALK PHOS: 78 U/L (ref 38–126)
ALT: 28 U/L (ref 14–54)
AST: 32 U/L (ref 15–41)
Anion gap: 9 (ref 5–15)
BILIRUBIN TOTAL: 0.6 mg/dL (ref 0.3–1.2)
BUN: 38 mg/dL — AB (ref 6–20)
CALCIUM: 9.3 mg/dL (ref 8.9–10.3)
CO2: 28 mmol/L (ref 22–32)
Chloride: 96 mmol/L — ABNORMAL LOW (ref 101–111)
Creatinine, Ser: 6.33 mg/dL — ABNORMAL HIGH (ref 0.44–1.00)
GFR calc Af Amer: 6 mL/min — ABNORMAL LOW (ref >60)
GFR calc non Af Amer: 5 mL/min — ABNORMAL LOW (ref >60)
GLUCOSE: 110 mg/dL — AB (ref 65–99)
Potassium: 5.6 mmol/L — ABNORMAL HIGH (ref 3.5–5.1)
Sodium: 133 mmol/L — ABNORMAL LOW (ref 135–145)
TOTAL PROTEIN: 5.7 g/dL — AB (ref 6.5–8.1)

## 2015-11-28 LAB — BLOOD CULTURE ID PANEL (REFLEXED)
ACINETOBACTER BAUMANNII: NOT DETECTED
CANDIDA KRUSEI: NOT DETECTED
CARBAPENEM RESISTANCE: NOT DETECTED
Candida albicans: NOT DETECTED
Candida glabrata: NOT DETECTED
Candida parapsilosis: NOT DETECTED
Candida tropicalis: NOT DETECTED
ENTEROBACTERIACEAE SPECIES: NOT DETECTED
Enterobacter cloacae complex: NOT DETECTED
Enterococcus species: NOT DETECTED
Escherichia coli: NOT DETECTED
Haemophilus influenzae: NOT DETECTED
KLEBSIELLA OXYTOCA: NOT DETECTED
Klebsiella pneumoniae: NOT DETECTED
Listeria monocytogenes: NOT DETECTED
Methicillin resistance: NOT DETECTED
NEISSERIA MENINGITIDIS: NOT DETECTED
PSEUDOMONAS AERUGINOSA: NOT DETECTED
Proteus species: NOT DETECTED
STAPHYLOCOCCUS AUREUS BCID: DETECTED — AB
STAPHYLOCOCCUS SPECIES: DETECTED — AB
STREPTOCOCCUS SPECIES: NOT DETECTED
Serratia marcescens: NOT DETECTED
Streptococcus agalactiae: NOT DETECTED
Streptococcus pneumoniae: NOT DETECTED
Streptococcus pyogenes: NOT DETECTED
Vancomycin resistance: NOT DETECTED

## 2015-11-28 LAB — CBC
HCT: 33.9 % — ABNORMAL LOW (ref 36.0–46.0)
HEMATOCRIT: 37.3 % (ref 36.0–46.0)
HEMOGLOBIN: 11.9 g/dL — AB (ref 12.0–15.0)
Hemoglobin: 10.7 g/dL — ABNORMAL LOW (ref 12.0–15.0)
MCH: 30.6 pg (ref 26.0–34.0)
MCH: 30.9 pg (ref 26.0–34.0)
MCHC: 31.6 g/dL (ref 30.0–36.0)
MCHC: 31.9 g/dL (ref 30.0–36.0)
MCV: 96.9 fL (ref 78.0–100.0)
MCV: 96.9 fL (ref 78.0–100.0)
PLATELETS: 88 10*3/uL — AB (ref 150–400)
Platelets: 93 10*3/uL — ABNORMAL LOW (ref 150–400)
RBC: 3.5 MIL/uL — ABNORMAL LOW (ref 3.87–5.11)
RBC: 3.85 MIL/uL — AB (ref 3.87–5.11)
RDW: 15.3 % (ref 11.5–15.5)
RDW: 15.3 % (ref 11.5–15.5)
WBC: 4.2 10*3/uL (ref 4.0–10.5)
WBC: 4.7 10*3/uL (ref 4.0–10.5)

## 2015-11-28 LAB — GLUCOSE, CAPILLARY
GLUCOSE-CAPILLARY: 107 mg/dL — AB (ref 65–99)
Glucose-Capillary: 115 mg/dL — ABNORMAL HIGH (ref 65–99)
Glucose-Capillary: 144 mg/dL — ABNORMAL HIGH (ref 65–99)

## 2015-11-28 LAB — PROTIME-INR
INR: 1.16 (ref 0.00–1.49)
Prothrombin Time: 15 seconds (ref 11.6–15.2)

## 2015-11-28 SURGERY — REVISION OF ARTERIOVENOUS GORETEX GRAFT
Anesthesia: General | Laterality: Right

## 2015-11-28 MED ORDER — DARBEPOETIN ALFA 60 MCG/0.3ML IJ SOSY: 60.0000 ug | PREFILLED_SYRINGE | INTRAMUSCULAR | Status: DC

## 2015-11-28 MED ORDER — CEFAZOLIN IN D5W 1 GM/50ML IV SOLN
1.0000 g | INTRAVENOUS | Status: DC
  Filled 2015-11-28: qty 50

## 2015-11-28 MED ORDER — PHENYLEPHRINE HCL 10 MG/ML IJ SOLN
10.0000 mg | INTRAVENOUS | Status: DC | PRN
  Administered 2015-11-28: 60 ug/min via INTRAVENOUS

## 2015-11-28 MED ORDER — PROPOFOL 10 MG/ML IV BOLUS
INTRAVENOUS | Status: AC
  Filled 2015-11-28: qty 20

## 2015-11-28 MED ORDER — SODIUM CHLORIDE 0.9 % IV SOLN
INTRAVENOUS | Status: DC
  Administered 2015-11-28: 07:00:00 via INTRAVENOUS

## 2015-11-28 MED ORDER — PROTAMINE SULFATE 10 MG/ML IV SOLN
INTRAVENOUS | Status: DC | PRN
  Administered 2015-11-28 (×2): 25 mg via INTRAVENOUS

## 2015-11-28 MED ORDER — LIDOCAINE 2% (20 MG/ML) 5 ML SYRINGE
INTRAMUSCULAR | Status: AC
  Filled 2015-11-28: qty 5

## 2015-11-28 MED ORDER — CEFAZOLIN SODIUM-DEXTROSE 2-4 GM/100ML-% IV SOLN
2.0000 g | INTRAVENOUS | Status: DC
  Filled 2015-11-28: qty 100

## 2015-11-28 MED ORDER — HEMOSTATIC AGENTS (NO CHARGE) OPTIME
TOPICAL | Status: DC | PRN
  Administered 2015-11-28: 1 via TOPICAL

## 2015-11-28 MED ORDER — PROTAMINE SULFATE 10 MG/ML IV SOLN
INTRAVENOUS | Status: AC
  Filled 2015-11-28: qty 5

## 2015-11-28 MED ORDER — LIDOCAINE 2% (20 MG/ML) 5 ML SYRINGE
INTRAMUSCULAR | Status: DC | PRN
  Administered 2015-11-28: 40 mg via INTRAVENOUS

## 2015-11-28 MED ORDER — SODIUM CHLORIDE 0.9 % IV SOLN
INTRAVENOUS | Status: DC | PRN
  Administered 2015-11-28: 500 mL

## 2015-11-28 MED ORDER — HEPARIN SODIUM (PORCINE) 1000 UNIT/ML IJ SOLN
INTRAMUSCULAR | Status: DC | PRN
  Administered 2015-11-28: 4000 [IU] via INTRAVENOUS

## 2015-11-28 MED ORDER — CEFAZOLIN SODIUM-DEXTROSE 2-4 GM/100ML-% IV SOLN
2.0000 g | INTRAVENOUS | Status: DC
  Administered 2015-11-28: 2 g via INTRAVENOUS
  Filled 2015-11-28 (×2): qty 100

## 2015-11-28 MED ORDER — LIDOCAINE-EPINEPHRINE (PF) 1 %-1:200000 IJ SOLN
INTRAMUSCULAR | Status: AC
  Filled 2015-11-28: qty 30

## 2015-11-28 MED ORDER — SODIUM CHLORIDE 0.9 % IR SOLN
Status: DC | PRN
  Administered 2015-11-28: 1000 mL

## 2015-11-28 MED ORDER — LIDOCAINE-EPINEPHRINE (PF) 1 %-1:200000 IJ SOLN
INTRAMUSCULAR | Status: DC | PRN
  Administered 2015-11-28: 22 mL

## 2015-11-28 MED ORDER — HEPARIN SODIUM (PORCINE) 1000 UNIT/ML IJ SOLN
INTRAMUSCULAR | Status: AC
  Filled 2015-11-28: qty 1

## 2015-11-28 MED ORDER — PROPOFOL 500 MG/50ML IV EMUL
INTRAVENOUS | Status: DC | PRN
  Administered 2015-11-28: 60 ug/kg/min via INTRAVENOUS

## 2015-11-28 SURGICAL SUPPLY — 38 items
BANDAGE ACE 4X5 VEL STRL LF (GAUZE/BANDAGES/DRESSINGS) ×3 IMPLANT
BNDG GAUZE ELAST 4 BULKY (GAUZE/BANDAGES/DRESSINGS) ×3 IMPLANT
CANISTER SUCTION 2500CC (MISCELLANEOUS) ×3 IMPLANT
CLIP TI MEDIUM 6 (CLIP) ×3 IMPLANT
CLIP TI WIDE RED SMALL 6 (CLIP) ×3 IMPLANT
DERMABOND ADVANCED (GAUZE/BANDAGES/DRESSINGS) ×2
DERMABOND ADVANCED .7 DNX12 (GAUZE/BANDAGES/DRESSINGS) ×1 IMPLANT
ELECT REM PT RETURN 9FT ADLT (ELECTROSURGICAL) ×3
ELECTRODE REM PT RTRN 9FT ADLT (ELECTROSURGICAL) ×1 IMPLANT
GEL ULTRASOUND 20GR AQUASONIC (MISCELLANEOUS) IMPLANT
GLOVE BIOGEL PI IND STRL 6.5 (GLOVE) ×3 IMPLANT
GLOVE BIOGEL PI IND STRL 7.5 (GLOVE) ×3 IMPLANT
GLOVE BIOGEL PI INDICATOR 6.5 (GLOVE) ×6
GLOVE BIOGEL PI INDICATOR 7.5 (GLOVE) ×6
GLOVE OPTIFIT SS 6.5 STRL BRWN (GLOVE) ×3 IMPLANT
GLOVE SURG SS PI 7.5 STRL IVOR (GLOVE) ×9 IMPLANT
GOWN STRL REUS W/ TWL LRG LVL3 (GOWN DISPOSABLE) ×3 IMPLANT
GOWN STRL REUS W/ TWL XL LVL3 (GOWN DISPOSABLE) ×1 IMPLANT
GOWN STRL REUS W/TWL LRG LVL3 (GOWN DISPOSABLE) ×6
GOWN STRL REUS W/TWL XL LVL3 (GOWN DISPOSABLE) ×2
GRAFT GORETEX 6X10 (Vascular Products) ×3 IMPLANT
HEMOSTAT SNOW SURGICEL 2X4 (HEMOSTASIS) ×3 IMPLANT
KIT BASIN OR (CUSTOM PROCEDURE TRAY) ×3 IMPLANT
KIT ROOM TURNOVER OR (KITS) ×3 IMPLANT
LIQUID BAND (GAUZE/BANDAGES/DRESSINGS) ×3 IMPLANT
NEEDLE HYPO 25GX1X1/2 BEV (NEEDLE) ×3 IMPLANT
NS IRRIG 1000ML POUR BTL (IV SOLUTION) ×3 IMPLANT
PACK CV ACCESS (CUSTOM PROCEDURE TRAY) ×3 IMPLANT
PAD ARMBOARD 7.5X6 YLW CONV (MISCELLANEOUS) ×6 IMPLANT
SPONGE GAUZE 4X4 12PLY STER LF (GAUZE/BANDAGES/DRESSINGS) ×3 IMPLANT
SUT GORETEX 6.0 TT9 (SUTURE) ×9 IMPLANT
SUT PROLENE 6 0 BV (SUTURE) ×6 IMPLANT
SUT SILK 2 0 FS (SUTURE) ×3 IMPLANT
SUT VIC AB 3-0 SH 27 (SUTURE) ×4
SUT VIC AB 3-0 SH 27X BRD (SUTURE) ×2 IMPLANT
SUT VICRYL 4-0 PS2 18IN ABS (SUTURE) ×3 IMPLANT
UNDERPAD 30X30 INCONTINENT (UNDERPADS AND DIAPERS) ×3 IMPLANT
WATER STERILE IRR 1000ML POUR (IV SOLUTION) ×3 IMPLANT

## 2015-11-28 NOTE — Procedures (Signed)
Patient was seen on dialysis and the procedure was supervised.  BFR 300  Via AVG BP is  106/43.   Patient appears to be tolerating treatment well- using only one side of revised AVG   Rhiannon Sassaman A 11/28/2015

## 2015-11-28 NOTE — Consult Note (Signed)
Reason for Consult: To manage dialysis and dialysis related needs Referring Physician: Rogers Blocker [name] is an 82 y.o. female with HTN, ESRD- TTS at [place], hyperlipidemia, gout who presented to hospital on 7/21 AM with complaints of AVG bleeding (woke up Friday to it bleeding- then recurred times 2 thru the day) and fever- underwent revision of AVG early this AM.  Blood cultures then came back positive for non meth resistant staph aureus- ID has seen and will be continued on ancef and TTE will be obtained.  We are asked to provide her routine HD - will get treatment today.  She is seen trying to eat lunch with now much appetite-pain in arm is controlled and she is shocked to hear that she has bacteria in her blood   Dialysis Orders: Center: [facility] on TTS . EDW 60.0 HD Bath 2k,2.0 Time 4hr Heparin 4000. Access R FA AVG  Mircera 51mg q 4weeks (last given 11/03/15)  Other op labs hgb 11.9 =11/26/15 Ca 10.3 phos 4.2 pth 234    Past Medical History  Diagnosis Date  . HTN (hypertension)   . ESRD on hemodialysis ([place]   . Mild aortic stenosis   . Dyslipidemia   . TIA (transient ischemic attack)   . Gout   . Benign bladder mass   . DJD (degenerative joint disease)   . Anemia   . Obstructive sleep apnea     mild  . Chronic cough   . Heart murmur   . Pneumonia   . Orthostatic syncope     around year 2006    Past Surgical History  Procedure Laterality Date  . Nephrectomy      left  . Total abdominal hysterectomy    . Bone marrow biopsy    . Left arm dialysis graft    . Colonoscopy w/ biopsies and polypectomy    . Cataract extraction w/ intraocular lens  implant, bilateral    . Revision of arteriovenous goretex graft Right 11/24/2014    Procedure: REPLACEMENT OF  MEDIAL SIDE OF RIGHT FORARM ARTERIOVENOUS GORETEX GRAFT;  Surgeon: BConrad Kim MD;  Location[facility]  Service: Vascular;  Laterality: Right;    Family History  Problem Relation Age of Onset  .  Hypertension Mother   . Diabetes Mother   . Hypertension Other     Social History:  reports that she has never smoked. She has never used smokeless tobacco. She reports that she does not drink alcohol or use illicit drugs.  Allergies:  Allergies  Allergen Reactions  . Tape Other (See Comments)    Pulls off skin  . Sulfa Antibiotics Other (See Comments)    Bumps all over body    Medications: I have reviewed the patient's current medications.   Results for orders placed or performed during the hospital encounter of 11/27/15 (from the past 48 hour(s))  Comprehensive metabolic panel     Status: Abnormal   Collection Time: 11/27/15  3:55 PM  Result Value Ref Range   Sodium 136 135 - 145 mmol/L   Potassium 5.1 3.5 - 5.1 mmol/L   Chloride 96 (L) 101 - 111 mmol/L   CO2 27 22 - 32 mmol/L   Glucose, Bld 109 (H) 65 - 99 mg/dL   BUN 26 (H) 6 - 20 mg/dL   Creatinine, Ser 5.58 (H) 0.44 - 1.00 mg/dL   Calcium 10.2 8.9 - 10.3 mg/dL   Total Protein 6.5 6.5 - 8.1 g/dL   Albumin 4.1  3.5 - 5.0 g/dL   AST 23 15 - 41 U/L   ALT 20 14 - 54 U/L   Alkaline Phosphatase 92 38 - 126 U/L   Total Bilirubin 0.5 0.3 - 1.2 mg/dL   GFR calc non Af Amer 6 (L) >60 mL/min   GFR calc Af Amer 7 (L) >60 mL/min    Comment: (NOTE) The eGFR has been calculated using the CKD EPI equation. This calculation has not been validated in all clinical situations. eGFR's persistently <60 mL/min signify possible Chronic Kidney Disease.    Anion gap 13 5 - 15  CBC WITH DIFFERENTIAL     Status: Abnormal   Collection Time: 11/27/15  3:55 PM  Result Value Ref Range   WBC 6.1 4.0 - 10.5 K/uL   RBC 4.07 3.87 - 5.11 MIL/uL   Hemoglobin 12.8 12.0 - 15.0 g/dL   HCT 38.9 36.0 - 46.0 %   MCV 95.6 78.0 - 100.0 fL   MCH 31.4 26.0 - 34.0 pg   MCHC 32.9 30.0 - 36.0 g/dL   RDW 15.1 11.5 - 15.5 %   Platelets 105 (L) 150 - 400 K/uL    Comment: REPEATED TO VERIFY SPECIMEN CHECKED FOR CLOTS PLATELET COUNT CONFIRMED BY SMEAR     Neutrophils Relative % 89 %   Neutro Abs 5.4 1.7 - 7.7 K/uL   Lymphocytes Relative 6 %   Lymphs Abs 0.4 (L) 0.7 - 4.0 K/uL   Monocytes Relative 5 %   Monocytes Absolute 0.3 0.1 - 1.0 K/uL   Eosinophils Relative 0 %   Eosinophils Absolute 0.0 0.0 - 0.7 K/uL   Basophils Relative 0 %   Basophils Absolute 0.0 0.0 - 0.1 K/uL  Type and screen Orange     Status: None   Collection Time: 11/27/15  3:55 PM  Result Value Ref Range   ABO/RH(D) A POS    Antibody Screen NEG    Sample Expiration 11/30/2015   I-Stat CG4 Lactic Acid, ED  (not at  [facility])     Status: None   Collection Time: 11/27/15  4:13 PM  Result Value Ref Range   Lactic Acid, Venous 1.19 0.5 - 1.9 mmol/L  Blood Culture (routine x 2)     Status: None (Preliminary result)   Collection Time: 11/27/15  4:40 PM  Result Value Ref Range   Specimen Description BLOOD LEFT HAND    Special Requests BOTTLES DRAWN AEROBIC AND ANAEROBIC 5CC    Culture  Setup Time      GRAM POSITIVE COCCI IN CLUSTERS IN BOTH AEROBIC AND ANAEROBIC BOTTLES Organism ID to follow CRITICAL RESULT CALLED TO, READ BACK BY AND VERIFIED WITH: T. STONE, PHARM AT 0902 ON [id number] BY S. YARBROUGH    Culture TOO YOUNG TO READ    Report Status PENDING   Blood Culture ID Panel (Reflexed)     Status: Abnormal   Collection Time: 11/27/15  4:40 PM  Result Value Ref Range   Enterococcus species NOT DETECTED NOT DETECTED   Vancomycin resistance NOT DETECTED NOT DETECTED   Listeria monocytogenes NOT DETECTED NOT DETECTED   Staphylococcus species DETECTED (A) NOT DETECTED    Comment: CRITICAL RESULT CALLED TO, READ BACK BY AND VERIFIED WITH: T. STONE, PHARM AT 0902 ON [id number] BY S. YARBROUGH    Staphylococcus aureus DETECTED (A) NOT DETECTED    Comment: CRITICAL RESULT CALLED TO, READ BACK BY AND VERIFIED WITH: T. STONE, PHARM AT 0902 ON [id number] BY Rhea Bleacher  Methicillin resistance NOT DETECTED NOT DETECTED   Streptococcus species NOT DETECTED NOT  DETECTED   Streptococcus agalactiae NOT DETECTED NOT DETECTED   Streptococcus pneumoniae NOT DETECTED NOT DETECTED   Streptococcus pyogenes NOT DETECTED NOT DETECTED   Acinetobacter baumannii NOT DETECTED NOT DETECTED   Enterobacteriaceae species NOT DETECTED NOT DETECTED   Enterobacter cloacae complex NOT DETECTED NOT DETECTED   Escherichia coli NOT DETECTED NOT DETECTED   Klebsiella oxytoca NOT DETECTED NOT DETECTED   Klebsiella pneumoniae NOT DETECTED NOT DETECTED   Proteus species NOT DETECTED NOT DETECTED   Serratia marcescens NOT DETECTED NOT DETECTED   Carbapenem resistance NOT DETECTED NOT DETECTED   Haemophilus influenzae NOT DETECTED NOT DETECTED   Neisseria meningitidis NOT DETECTED NOT DETECTED   Pseudomonas aeruginosa NOT DETECTED NOT DETECTED   Candida albicans NOT DETECTED NOT DETECTED   Candida glabrata NOT DETECTED NOT DETECTED   Candida krusei NOT DETECTED NOT DETECTED   Candida parapsilosis NOT DETECTED NOT DETECTED   Candida tropicalis NOT DETECTED NOT DETECTED  Blood Culture (routine x 2)     Status: None (Preliminary result)   Collection Time: 11/27/15  4:47 PM  Result Value Ref Range   Specimen Description BLOOD LEFT FOREARM    Special Requests BOTTLES DRAWN AEROBIC AND ANAEROBIC 5CC    Culture  Setup Time      GRAM POSITIVE COCCI IN CLUSTERS IN BOTH AEROBIC AND ANAEROBIC BOTTLES CRITICAL RESULT CALLED TO, READ BACK BY AND VERIFIED WITH: T. STONE, PHARM AT 0902 ON [id number] BY Rhea Bleacher    Culture TOO YOUNG TO READ    Report Status PENDING   CBC     Status: Abnormal   Collection Time: 11/27/15  8:30 PM  Result Value Ref Range   WBC 5.1 4.0 - 10.5 K/uL   RBC 3.51 (L) 3.87 - 5.11 MIL/uL   Hemoglobin 10.9 (L) 12.0 - 15.0 g/dL   HCT 33.7 (L) 36.0 - 46.0 %   MCV 96.0 78.0 - 100.0 fL   MCH 31.1 26.0 - 34.0 pg   MCHC 32.3 30.0 - 36.0 g/dL   RDW 15.1 11.5 - 15.5 %   Platelets 99 (L) 150 - 400 K/uL    Comment: CONSISTENT WITH PREVIOUS RESULT  MRSA PCR  Screening     Status: None   Collection Time: 11/27/15  8:47 PM  Result Value Ref Range   MRSA by PCR NEGATIVE NEGATIVE    Comment:        The GeneXpert MRSA Assay (FDA approved for NASAL specimens only), is one component of a comprehensive MRSA colonization surveillance program. It is not intended to diagnose MRSA infection nor to guide or monitor treatment for MRSA infections.   Glucose, capillary     Status: None   Collection Time: 11/27/15  9:10 PM  Result Value Ref Range   Glucose-Capillary 96 65 - 99 mg/dL  CBC     Status: Abnormal   Collection Time: 11/28/15 12:06 AM  Result Value Ref Range   WBC 4.7 4.0 - 10.5 K/uL   RBC 3.85 (L) 3.87 - 5.11 MIL/uL   Hemoglobin 11.9 (L) 12.0 - 15.0 g/dL   HCT 37.3 36.0 - 46.0 %   MCV 96.9 78.0 - 100.0 fL   MCH 30.9 26.0 - 34.0 pg   MCHC 31.9 30.0 - 36.0 g/dL   RDW 15.3 11.5 - 15.5 %   Platelets 93 (L) 150 - 400 K/uL    Comment: CONSISTENT WITH PREVIOUS RESULT  Glucose, capillary  Status: Abnormal   Collection Time: 11/28/15  1:44 AM  Result Value Ref Range   Glucose-Capillary 115 (H) 65 - 99 mg/dL  CBC     Status: Abnormal   Collection Time: 11/28/15  4:19 AM  Result Value Ref Range   WBC 4.2 4.0 - 10.5 K/uL   RBC 3.50 (L) 3.87 - 5.11 MIL/uL   Hemoglobin 10.7 (L) 12.0 - 15.0 g/dL   HCT 33.9 (L) 36.0 - 46.0 %   MCV 96.9 78.0 - 100.0 fL   MCH 30.6 26.0 - 34.0 pg   MCHC 31.6 30.0 - 36.0 g/dL   RDW 15.3 11.5 - 15.5 %   Platelets 88 (L) 150 - 400 K/uL    Comment: CONSISTENT WITH PREVIOUS RESULT  Comprehensive metabolic panel     Status: Abnormal   Collection Time: 11/28/15  4:19 AM  Result Value Ref Range   Sodium 133 (L) 135 - 145 mmol/L   Potassium 5.6 (H) 3.5 - 5.1 mmol/L   Chloride 96 (L) 101 - 111 mmol/L   CO2 28 22 - 32 mmol/L   Glucose, Bld 110 (H) 65 - 99 mg/dL   BUN 38 (H) 6 - 20 mg/dL   Creatinine, Ser 6.33 (H) 0.44 - 1.00 mg/dL   Calcium 9.3 8.9 - 10.3 mg/dL   Total Protein 5.7 (L) 6.5 - 8.1 g/dL   Albumin  3.3 (L) 3.5 - 5.0 g/dL   AST 32 15 - 41 U/L   ALT 28 14 - 54 U/L   Alkaline Phosphatase 78 38 - 126 U/L   Total Bilirubin 0.6 0.3 - 1.2 mg/dL   GFR calc non Af Amer 5 (L) >60 mL/min   GFR calc Af Amer 6 (L) >60 mL/min    Comment: (NOTE) The eGFR has been calculated using the CKD EPI equation. This calculation has not been validated in all clinical situations. eGFR's persistently <60 mL/min signify possible Chronic Kidney Disease.    Anion gap 9 5 - 15  Protime-INR     Status: None   Collection Time: 11/28/15  4:19 AM  Result Value Ref Range   Prothrombin Time 15.0 11.6 - 15.2 seconds   INR 1.16 0.00 - 1.49  Glucose, capillary     Status: Abnormal   Collection Time: 11/28/15  5:55 AM  Result Value Ref Range   Glucose-Capillary 107 (H) 65 - 99 mg/dL  Glucose, capillary     Status: Abnormal   Collection Time: 11/28/15 11:44 AM  Result Value Ref Range   Glucose-Capillary 144 (H) 65 - 99 mg/dL    Dg Chest 2 View  11/27/2015  CLINICAL DATA:  Cough. EXAM: CHEST  2 VIEW COMPARISON:  None. FINDINGS: The heart, hila, and mediastinum are normal. No focal infiltrates or overt edema. No acute abnormalities identified. IMPRESSION: No active cardiopulmonary disease. Electronically Signed   By: Dorise Bullion III M.D   On: 11/27/2015 17:54    ROS: positive for fatigue, decreased appetite and arm pain previous AVG bleeding - otherwise ROS negative   Blood pressure 112/41, pulse 61, temperature 97.9 F (36.6 C), temperature source Oral, resp. rate 20, height 5' 5"  (1.651 m), weight 59.4 kg (130 lb 15.3 oz), SpO2 93 %. General appearance: alert and cooperative Eyes: conjunctivae/corneas clear. PERRL, EOM's intact. Fundi benign. Neck: no adenopathy, no carotid bruit, no JVD, supple, symmetrical, trachea midline and thyroid not enlarged, symmetric, no tenderness/mass/nodules Resp: clear to auscultation bilaterally Cardio: regular rate and rhythm, S1, S2 normal, no murmur, click,  rub or  gallop GI: soft, non-tender; bowel sounds normal; no masses,  no organomegaly Extremities: edema 1 plus Neurologic: Grossly normal right lower arm bandaged- can palpate thrill near elbow  Assessment/Plan: 82 year old female with hyperlipidemia and ESRD who presented with bleeding from AVG s/p revision as well as MSSA bacteremia 1 MSSA bacteremia- on ancef- ID has seen recommending TTE- could this be related to many recent episodes of AVG bleeding or graft degeneration ?  I think so 2. AVG bleeding - secondary to degeneration- s/p revision 7/22 3 ESRD: normally TTS at [place] via AVG- plan for routine HD today K 5.6- run on 2 K- will need to use old limb of graft while new limb incorporates  3 Hypertension: not an issue at this time- no BP meds- will UF to OP EDW 4. Anemia of ESRD: hgb > 10- continue ESA now in form of darbe 5. Metabolic Bone Disease: continue home meds of phoslo 3 TID with meals, sensipar 30 daily- does not appear to be on vitamin D as OP   Kameelah Minish A 11/28/2015, 12:33 PM

## 2015-11-28 NOTE — Consult Note (Addendum)
[facility]  Date of Admission:  11/27/2015  Date of Consult:  11/28/2015  Reason for Consult:Staph bacteremia Referring Physician: CHAMP  Impression/Recommendation Staph bacteremia Start ancef with pharm assistance Recheck her BCx in AM Check TTE (she has a signficant murmur) Would defer checking TEE due to her age and co-morbidities.   ESRD Followed by renal T/TH/S HD   Bleeding from AVG Being followed by Vasc Possible revision  Thank you so much for this interesting consult,   Bobby Rumpf (pager) [phone] [email or web address]  [name] is an 82 y.o. female.  HPI: 82 yo F with hx of ESRD due to HTN, noted bleeding from AVG on 7-21. She came to ED and was noted to be febrile. She was started on vanco/zosyn and BCx were sent.  She was eval by vascular on 7-22, she will need revision of her graft.  Per vascular, her last access manipulation was 11-2014.    Past Medical History  Diagnosis Date  . HTN (hypertension)   . ESRD on hemodialysis ([place])   . Mild aortic stenosis   . Dyslipidemia   . TIA (transient ischemic attack)   . Gout   . Benign bladder mass   . DJD (degenerative joint disease)   . Anemia   . Obstructive sleep apnea     mild  . Chronic cough   . Heart murmur   . Pneumonia   . Orthostatic syncope     around year 2006    Past Surgical History  Procedure Laterality Date  . Nephrectomy      left  . Total abdominal hysterectomy    . Bone marrow biopsy    . Left arm dialysis graft    . Colonoscopy w/ biopsies and polypectomy    . Cataract extraction w/ intraocular lens  implant, bilateral    . Revision of arteriovenous goretex graft Right 11/24/2014    Procedure: REPLACEMENT OF  MEDIAL SIDE OF RIGHT FORARM ARTERIOVENOUS GORETEX GRAFT;  Surgeon: Conrad Spencer, MD;  Location: [facility];  Service: Vascular;  Laterality: Right;     Allergies  Allergen Reactions  . Tape Other (See Comments)    Pulls off  skin  . Sulfa Antibiotics Other (See Comments)    Bumps all over body    Medications:  Scheduled: . atorvastatin  10 mg Oral QHS  . calcium acetate  2,001 mg Oral TID WC  .  ceFAZolin (ANCEF) IV  2 g Intravenous Q T,Th,Sa-HD  . cinacalcet  30 mg Oral Q breakfast  . [START ON 12/01/2015] darbepoetin (ARANESP) injection - DIALYSIS  60 mcg Intravenous Q Tue-HD  . multivitamin with minerals  1 tablet Oral Daily    Abtx:  Anti-infectives    Start     Dose/Rate Route Frequency Ordered Stop   11/28/15 1200  [MAR Hold]  vancomycin (VANCOCIN) 500 mg in sodium chloride 0.9 % 100 mL IVPB  Status:  Discontinued     (MAR Hold since 11/28/15 0626)   500 mg 100 mL/hr over 60 Minutes Intravenous Every T-Th-Sa (Hemodialysis) 11/27/15 2043 11/28/15 0916   11/28/15 1200  ceFAZolin (ANCEF) IVPB 2g/100 mL premix     2 g 200 mL/hr over 30 Minutes Intravenous Every T-Th-Sa (Hemodialysis) 11/28/15 0916     11/28/15 0800  ceFAZolin (ANCEF) IVPB 1 g/50 mL premix  Status:  Discontinued    Comments:  Send with pt to OR   1 g 100 mL/hr over 30 Minutes Intravenous To  Short Stay 11/28/15 0100 11/28/15 1015   11/28/15 0330  [MAR Hold]  piperacillin-tazobactam (ZOSYN) IVPB 2.25 g  Status:  Discontinued     (MAR Hold since 11/28/15 0626)   2.25 g 100 mL/hr over 30 Minutes Intravenous Every 8 hours 11/27/15 2043 11/28/15 0916   11/27/15 1930  vancomycin (VANCOCIN) 1,250 mg in sodium chloride 0.9 % 250 mL IVPB     1,250 mg 166.7 mL/hr over 90 Minutes Intravenous  Once 11/27/15 1901 11/27/15 2103   11/27/15 1915  piperacillin-tazobactam (ZOSYN) IVPB 3.375 g     3.375 g 100 mL/hr over 30 Minutes Intravenous  Once 11/27/15 1901 11/27/15 2010      Total days of antibiotics: 1          Social History:  reports that she has never smoked. She has never used smokeless tobacco. She reports that she does not drink alcohol or use illicit drugs.  Family History  Problem Relation Age of Onset  . Hypertension Mother     . Diabetes Mother   . Hypertension Other     General ROS: no dysphagia, no change in wt, no sob, chronic cough, see HPI.   Blood pressure 112/41, pulse 61, temperature 97.9 F (36.6 C), temperature source Oral, resp. rate 20, height 5' 5" (1.651 m), weight 59.4 kg (130 lb 15.3 oz), SpO2 93 %. General appearance: alert and no distress Eyes: negative findings: conjunctivae and sclerae normal and pupils equal, round, reactive to light and accomodation Throat: normal findings: oropharynx pink & moist without lesions or evidence of thrush Neck: no adenopathy and supple, symmetrical, trachea midline Lungs: clear to auscultation bilaterally Heart: irregularly irregular rhythm and systolic murmur: holosystolic 3/6, crescendo at 2nd left intercostal space Abdomen: normal findings: bowel sounds normal and soft, non-tender Extremities: edema none. normal light touch BLE, grossly and RUE wrapped.    Results for orders placed or performed during the hospital encounter of 11/27/15 (from the past 48 hour(s))  Comprehensive metabolic panel     Status: Abnormal   Collection Time: 11/27/15  3:55 PM  Result Value Ref Range   Sodium 136 135 - 145 mmol/L   Potassium 5.1 3.5 - 5.1 mmol/L   Chloride 96 (L) 101 - 111 mmol/L   CO2 27 22 - 32 mmol/L   Glucose, Bld 109 (H) 65 - 99 mg/dL   BUN 26 (H) 6 - 20 mg/dL   Creatinine, Ser 5.58 (H) 0.44 - 1.00 mg/dL   Calcium 10.2 8.9 - 10.3 mg/dL   Total Protein 6.5 6.5 - 8.1 g/dL   Albumin 4.1 3.5 - 5.0 g/dL   AST 23 15 - 41 U/L   ALT 20 14 - 54 U/L   Alkaline Phosphatase 92 38 - 126 U/L   Total Bilirubin 0.5 0.3 - 1.2 mg/dL   GFR calc non Af Amer 6 (L) >60 mL/min   GFR calc Af Amer 7 (L) >60 mL/min    Comment: (NOTE) The eGFR has been calculated using the CKD EPI equation. This calculation has not been validated in all clinical situations. eGFR's persistently <60 mL/min signify possible Chronic Kidney Disease.    Anion gap 13 5 - 15  CBC WITH  DIFFERENTIAL     Status: Abnormal   Collection Time: 11/27/15  3:55 PM  Result Value Ref Range   WBC 6.1 4.0 - 10.5 K/uL   RBC 4.07 3.87 - 5.11 MIL/uL   Hemoglobin 12.8 12.0 - 15.0 g/dL   HCT 38.9 36.0 - 46.0 %     MCV 95.6 78.0 - 100.0 fL   MCH 31.4 26.0 - 34.0 pg   MCHC 32.9 30.0 - 36.0 g/dL   RDW 15.1 11.5 - 15.5 %   Platelets 105 (L) 150 - 400 K/uL    Comment: REPEATED TO VERIFY SPECIMEN CHECKED FOR CLOTS PLATELET COUNT CONFIRMED BY SMEAR    Neutrophils Relative % 89 %   Neutro Abs 5.4 1.7 - 7.7 K/uL   Lymphocytes Relative 6 %   Lymphs Abs 0.4 (L) 0.7 - 4.0 K/uL   Monocytes Relative 5 %   Monocytes Absolute 0.3 0.1 - 1.0 K/uL   Eosinophils Relative 0 %   Eosinophils Absolute 0.0 0.0 - 0.7 K/uL   Basophils Relative 0 %   Basophils Absolute 0.0 0.0 - 0.1 K/uL  Type and screen [facility]     Status: None   Collection Time: 11/27/15  3:55 PM  Result Value Ref Range   ABO/RH(D) A POS    Antibody Screen NEG    Sample Expiration 11/30/2015   I-Stat CG4 Lactic Acid, ED  (not at  [facility])     Status: None   Collection Time: 11/27/15  4:13 PM  Result Value Ref Range   Lactic Acid, Venous 1.19 0.5 - 1.9 mmol/L  Blood Culture (routine x 2)     Status: None (Preliminary result)   Collection Time: 11/27/15  4:40 PM  Result Value Ref Range   Specimen Description BLOOD LEFT HAND    Special Requests BOTTLES DRAWN AEROBIC AND ANAEROBIC 5CC    Culture  Setup Time      GRAM POSITIVE COCCI IN CLUSTERS IN BOTH AEROBIC AND ANAEROBIC BOTTLES Organism ID to follow CRITICAL RESULT CALLED TO, READ BACK BY AND VERIFIED WITH: T. STONE, PHARM AT 0902 ON [id number] BY S. YARBROUGH    Culture TOO YOUNG TO READ    Report Status PENDING   Blood Culture ID Panel (Reflexed)     Status: Abnormal   Collection Time: 11/27/15  4:40 PM  Result Value Ref Range   Enterococcus species NOT DETECTED NOT DETECTED   Vancomycin resistance NOT DETECTED NOT DETECTED   Listeria monocytogenes NOT  DETECTED NOT DETECTED   Staphylococcus species DETECTED (A) NOT DETECTED    Comment: CRITICAL RESULT CALLED TO, READ BACK BY AND VERIFIED WITH: T. STONE, PHARM AT 0902 ON 072217 BY S. YARBROUGH    Staphylococcus aureus DETECTED (A) NOT DETECTED    Comment: CRITICAL RESULT CALLED TO, READ BACK BY AND VERIFIED WITH: T. STONE, PHARM AT 0902 ON 072217 BY S. YARBROUGH    Methicillin resistance NOT DETECTED NOT DETECTED   Streptococcus species NOT DETECTED NOT DETECTED   Streptococcus agalactiae NOT DETECTED NOT DETECTED   Streptococcus pneumoniae NOT DETECTED NOT DETECTED   Streptococcus pyogenes NOT DETECTED NOT DETECTED   Acinetobacter baumannii NOT DETECTED NOT DETECTED   Enterobacteriaceae species NOT DETECTED NOT DETECTED   Enterobacter cloacae complex NOT DETECTED NOT DETECTED   Escherichia coli NOT DETECTED NOT DETECTED   Klebsiella oxytoca NOT DETECTED NOT DETECTED   Klebsiella pneumoniae NOT DETECTED NOT DETECTED   Proteus species NOT DETECTED NOT DETECTED   Serratia marcescens NOT DETECTED NOT DETECTED   Carbapenem resistance NOT DETECTED NOT DETECTED   Haemophilus influenzae NOT DETECTED NOT DETECTED   Neisseria meningitidis NOT DETECTED NOT DETECTED   Pseudomonas aeruginosa NOT DETECTED NOT DETECTED   Candida albicans NOT DETECTED NOT DETECTED   Candida glabrata NOT DETECTED NOT DETECTED   Candida krusei NOT DETECTED   NOT DETECTED   Candida parapsilosis NOT DETECTED NOT DETECTED   Candida tropicalis NOT DETECTED NOT DETECTED  Blood Culture (routine x 2)     Status: None (Preliminary result)   Collection Time: 11/27/15  4:47 PM  Result Value Ref Range   Specimen Description BLOOD LEFT FOREARM    Special Requests BOTTLES DRAWN AEROBIC AND ANAEROBIC 5CC    Culture  Setup Time      GRAM POSITIVE COCCI IN CLUSTERS IN BOTH AEROBIC AND ANAEROBIC BOTTLES CRITICAL RESULT CALLED TO, READ BACK BY AND VERIFIED WITH: T. STONE, PHARM AT 0902 ON 072217 BY S. YARBROUGH    Culture TOO  YOUNG TO READ    Report Status PENDING   CBC     Status: Abnormal   Collection Time: 11/27/15  8:30 PM  Result Value Ref Range   WBC 5.1 4.0 - 10.5 K/uL   RBC 3.51 (L) 3.87 - 5.11 MIL/uL   Hemoglobin 10.9 (L) 12.0 - 15.0 g/dL   HCT 33.7 (L) 36.0 - 46.0 %   MCV 96.0 78.0 - 100.0 fL   MCH 31.1 26.0 - 34.0 pg   MCHC 32.3 30.0 - 36.0 g/dL   RDW 15.1 11.5 - 15.5 %   Platelets 99 (L) 150 - 400 K/uL    Comment: CONSISTENT WITH PREVIOUS RESULT  MRSA PCR Screening     Status: None   Collection Time: 11/27/15  8:47 PM  Result Value Ref Range   MRSA by PCR NEGATIVE NEGATIVE    Comment:        The GeneXpert MRSA Assay (FDA approved for NASAL specimens only), is one component of a comprehensive MRSA colonization surveillance program. It is not intended to diagnose MRSA infection nor to guide or monitor treatment for MRSA infections.   Glucose, capillary     Status: None   Collection Time: 11/27/15  9:10 PM  Result Value Ref Range   Glucose-Capillary 96 65 - 99 mg/dL  CBC     Status: Abnormal   Collection Time: 11/28/15 12:06 AM  Result Value Ref Range   WBC 4.7 4.0 - 10.5 K/uL   RBC 3.85 (L) 3.87 - 5.11 MIL/uL   Hemoglobin 11.9 (L) 12.0 - 15.0 g/dL   HCT 37.3 36.0 - 46.0 %   MCV 96.9 78.0 - 100.0 fL   MCH 30.9 26.0 - 34.0 pg   MCHC 31.9 30.0 - 36.0 g/dL   RDW 15.3 11.5 - 15.5 %   Platelets 93 (L) 150 - 400 K/uL    Comment: CONSISTENT WITH PREVIOUS RESULT  Glucose, capillary     Status: Abnormal   Collection Time: 11/28/15  1:44 AM  Result Value Ref Range   Glucose-Capillary 115 (H) 65 - 99 mg/dL  CBC     Status: Abnormal   Collection Time: 11/28/15  4:19 AM  Result Value Ref Range   WBC 4.2 4.0 - 10.5 K/uL   RBC 3.50 (L) 3.87 - 5.11 MIL/uL   Hemoglobin 10.7 (L) 12.0 - 15.0 g/dL   HCT 33.9 (L) 36.0 - 46.0 %   MCV 96.9 78.0 - 100.0 fL   MCH 30.6 26.0 - 34.0 pg   MCHC 31.6 30.0 - 36.0 g/dL   RDW 15.3 11.5 - 15.5 %   Platelets 88 (L) 150 - 400 K/uL    Comment: CONSISTENT  WITH PREVIOUS RESULT  Comprehensive metabolic panel     Status: Abnormal   Collection Time: 11/28/15  4:19 AM  Result Value Ref Range     Sodium 133 (L) 135 - 145 mmol/L   Potassium 5.6 (H) 3.5 - 5.1 mmol/L   Chloride 96 (L) 101 - 111 mmol/L   CO2 28 22 - 32 mmol/L   Glucose, Bld 110 (H) 65 - 99 mg/dL   BUN 38 (H) 6 - 20 mg/dL   Creatinine, Ser 6.33 (H) 0.44 - 1.00 mg/dL   Calcium 9.3 8.9 - 10.3 mg/dL   Total Protein 5.7 (L) 6.5 - 8.1 g/dL   Albumin 3.3 (L) 3.5 - 5.0 g/dL   AST 32 15 - 41 U/L   ALT 28 14 - 54 U/L   Alkaline Phosphatase 78 38 - 126 U/L   Total Bilirubin 0.6 0.3 - 1.2 mg/dL   GFR calc non Af Amer 5 (L) >60 mL/min   GFR calc Af Amer 6 (L) >60 mL/min    Comment: (NOTE) The eGFR has been calculated using the CKD EPI equation. This calculation has not been validated in all clinical situations. eGFR's persistently <60 mL/min signify possible Chronic Kidney Disease.    Anion gap 9 5 - 15  Protime-INR     Status: None   Collection Time: 11/28/15  4:19 AM  Result Value Ref Range   Prothrombin Time 15.0 11.6 - 15.2 seconds   INR 1.16 0.00 - 1.49  Glucose, capillary     Status: Abnormal   Collection Time: 11/28/15  5:55 AM  Result Value Ref Range   Glucose-Capillary 107 (H) 65 - 99 mg/dL  Glucose, capillary     Status: Abnormal   Collection Time: 11/28/15 11:44 AM  Result Value Ref Range   Glucose-Capillary 144 (H) 65 - 99 mg/dL      Component Value Date/Time   SDES BLOOD LEFT FOREARM 11/27/2015 1647   SPECREQUEST BOTTLES DRAWN AEROBIC AND ANAEROBIC 5CC 11/27/2015 1647   CULT TOO YOUNG TO READ 11/27/2015 1647   REPTSTATUS PENDING 11/27/2015 1647   Dg Chest 2 View  11/27/2015  CLINICAL DATA:  Cough. EXAM: CHEST  2 VIEW COMPARISON:  None. FINDINGS: The heart, hila, and mediastinum are normal. No focal infiltrates or overt edema. No acute abnormalities identified. IMPRESSION: No active cardiopulmonary disease. Electronically Signed   By: David  Williams III M.D   On:  11/27/2015 17:54   Recent Results (from the past 240 hour(s))  Blood Culture (routine x 2)     Status: None (Preliminary result)   Collection Time: 11/27/15  4:40 PM  Result Value Ref Range Status   Specimen Description BLOOD LEFT HAND  Final   Special Requests BOTTLES DRAWN AEROBIC AND ANAEROBIC 5CC  Final   Culture  Setup Time   Final    GRAM POSITIVE COCCI IN CLUSTERS IN BOTH AEROBIC AND ANAEROBIC BOTTLES Organism ID to follow CRITICAL RESULT CALLED TO, READ BACK BY AND VERIFIED WITH: T. STONE, PHARM AT 0902 ON 072217 BY S. YARBROUGH    Culture TOO YOUNG TO READ  Final   Report Status PENDING  Incomplete  Blood Culture ID Panel (Reflexed)     Status: Abnormal   Collection Time: 11/27/15  4:40 PM  Result Value Ref Range Status   Enterococcus species NOT DETECTED NOT DETECTED Final   Vancomycin resistance NOT DETECTED NOT DETECTED Final   Listeria monocytogenes NOT DETECTED NOT DETECTED Final   Staphylococcus species DETECTED (A) NOT DETECTED Final    Comment: CRITICAL RESULT CALLED TO, READ BACK BY AND VERIFIED WITH: T. STONE, PHARM AT 0902 ON 072217 BY S. YARBROUGH    Staphylococcus aureus   DETECTED (A) NOT DETECTED Final    Comment: CRITICAL RESULT CALLED TO, READ BACK BY AND VERIFIED WITH: T. STONE, PHARM AT 0902 ON 072217 BY S. YARBROUGH    Methicillin resistance NOT DETECTED NOT DETECTED Final   Streptococcus species NOT DETECTED NOT DETECTED Final   Streptococcus agalactiae NOT DETECTED NOT DETECTED Final   Streptococcus pneumoniae NOT DETECTED NOT DETECTED Final   Streptococcus pyogenes NOT DETECTED NOT DETECTED Final   Acinetobacter baumannii NOT DETECTED NOT DETECTED Final   Enterobacteriaceae species NOT DETECTED NOT DETECTED Final   Enterobacter cloacae complex NOT DETECTED NOT DETECTED Final   Escherichia coli NOT DETECTED NOT DETECTED Final   Klebsiella oxytoca NOT DETECTED NOT DETECTED Final   Klebsiella pneumoniae NOT DETECTED NOT DETECTED Final   Proteus  species NOT DETECTED NOT DETECTED Final   Serratia marcescens NOT DETECTED NOT DETECTED Final   Carbapenem resistance NOT DETECTED NOT DETECTED Final   Haemophilus influenzae NOT DETECTED NOT DETECTED Final   Neisseria meningitidis NOT DETECTED NOT DETECTED Final   Pseudomonas aeruginosa NOT DETECTED NOT DETECTED Final   Candida albicans NOT DETECTED NOT DETECTED Final   Candida glabrata NOT DETECTED NOT DETECTED Final   Candida krusei NOT DETECTED NOT DETECTED Final   Candida parapsilosis NOT DETECTED NOT DETECTED Final   Candida tropicalis NOT DETECTED NOT DETECTED Final  Blood Culture (routine x 2)     Status: None (Preliminary result)   Collection Time: 11/27/15  4:47 PM  Result Value Ref Range Status   Specimen Description BLOOD LEFT FOREARM  Final   Special Requests BOTTLES DRAWN AEROBIC AND ANAEROBIC 5CC  Final   Culture  Setup Time   Final    GRAM POSITIVE COCCI IN CLUSTERS IN BOTH AEROBIC AND ANAEROBIC BOTTLES CRITICAL RESULT CALLED TO, READ BACK BY AND VERIFIED WITH: T. STONE, PHARM AT 0902 ON 072217 BY S. YARBROUGH    Culture TOO YOUNG TO READ  Final   Report Status PENDING  Incomplete  MRSA PCR Screening     Status: None   Collection Time: 11/27/15  8:47 PM  Result Value Ref Range Status   MRSA by PCR NEGATIVE NEGATIVE Final    Comment:        The GeneXpert MRSA Assay (FDA approved for NASAL specimens only), is one component of a comprehensive MRSA colonization surveillance program. It is not intended to diagnose MRSA infection nor to guide or monitor treatment for MRSA infections.       11/28/2015, 12:01 PM     LOS: 0 days    Records and images were personally reviewed where available.  

## 2015-11-28 NOTE — Progress Notes (Signed)
Rings x 2 one with multiple stones (all stones intact). Removed and given to Mickel Baas, RN. Partial plate removed and given to Mickel Baas, [place]

## 2015-11-28 NOTE — Progress Notes (Signed)
PHARMACY - PHYSICIAN COMMUNICATION CRITICAL VALUE ALERT - BLOOD CULTURE IDENTIFICATION (BCID)  Results for orders placed or performed during the hospital encounter of 11/27/15  Blood Culture ID Panel (Reflexed) (Collected: 11/27/2015  4:40 PM)  Result Value Ref Range   Enterococcus species NOT DETECTED NOT DETECTED   Vancomycin resistance NOT DETECTED NOT DETECTED   Listeria monocytogenes NOT DETECTED NOT DETECTED   Staphylococcus species DETECTED (A) NOT DETECTED   Staphylococcus aureus DETECTED (A) NOT DETECTED   Methicillin resistance NOT DETECTED NOT DETECTED   Streptococcus species NOT DETECTED NOT DETECTED   Streptococcus agalactiae NOT DETECTED NOT DETECTED   Streptococcus pneumoniae NOT DETECTED NOT DETECTED   Streptococcus pyogenes NOT DETECTED NOT DETECTED   Acinetobacter baumannii NOT DETECTED NOT DETECTED   Enterobacteriaceae species NOT DETECTED NOT DETECTED   Enterobacter cloacae complex NOT DETECTED NOT DETECTED   Escherichia coli NOT DETECTED NOT DETECTED   Klebsiella oxytoca NOT DETECTED NOT DETECTED   Klebsiella pneumoniae NOT DETECTED NOT DETECTED   Proteus species NOT DETECTED NOT DETECTED   Serratia marcescens NOT DETECTED NOT DETECTED   Carbapenem resistance NOT DETECTED NOT DETECTED   Haemophilus influenzae NOT DETECTED NOT DETECTED   Neisseria meningitidis NOT DETECTED NOT DETECTED   Pseudomonas aeruginosa NOT DETECTED NOT DETECTED   Candida albicans NOT DETECTED NOT DETECTED   Candida glabrata NOT DETECTED NOT DETECTED   Candida krusei NOT DETECTED NOT DETECTED   Candida parapsilosis NOT DETECTED NOT DETECTED   Candida tropicalis NOT DETECTED NOT DETECTED    Name of physician (or Provider) Contacted: Dr. Allyson Sabal  Changes to prescribed antibiotics required: Discontinue vancomycin and zosyn. Start cefazolin 2g IV qHD.  Dimitri Ped, PharmD. PGY-2 Pharmacy Resident Pager: [phone] 11/28/2015  9:14 AM

## 2015-11-28 NOTE — Anesthesia Postprocedure Evaluation (Signed)
Anesthesia Post Note  Patient: [name]  Procedure(s) Performed: Procedure(s) (LRB): REVISION OF RIGHT ARM ARTERIOVENOUS GORETEX GRAFT (Right)  Patient location during evaluation: PACU Anesthesia Type: MAC Level of consciousness: awake and alert Pain management: pain level controlled Vital Signs Assessment: post-procedure vital signs reviewed and stable Respiratory status: spontaneous breathing, nonlabored ventilation, respiratory function stable and patient connected to nasal cannula oxygen Cardiovascular status: stable and blood pressure returned to baseline Anesthetic complications: no    Last Vitals:  Filed Vitals:   11/28/15 0916 11/28/15 0942  BP: 100/37 93/44  Pulse: 67 66  Temp: 36.7 C   Resp: 16 17    Last Pain:  Filed Vitals:   11/28/15 0946  PainSc: 0-No pain                 Sundeep Destin,JAMES TERRILL

## 2015-11-28 NOTE — Anesthesia Preprocedure Evaluation (Addendum)
Anesthesia Evaluation  Patient identified by MRN, date of birth, ID band Patient awake    Reviewed: Allergy & Precautions, NPO status , Patient's Chart, lab work & pertinent test results  History of Anesthesia Complications Negative for: history of anesthetic complications  Airway Mallampati: II  TM Distance: >3 FB Neck ROM: Full    Dental  (+) Edentulous Upper   Pulmonary sleep apnea ,    breath sounds clear to auscultation       Cardiovascular hypertension, + Valvular Problems/Murmurs AS  Rhythm:Regular Rate:Normal + Systolic murmurs    Neuro/Psych TIA   GI/Hepatic   Endo/Other    Renal/GU ESRF and DialysisRenal disease     Musculoskeletal   Abdominal   Peds  Hematology  (+) anemia ,   Anesthesia Other Findings   Reproductive/Obstetrics                           Anesthesia Physical Anesthesia Plan  ASA: IV  Anesthesia Plan: General   Post-op Pain Management:    Induction: Intravenous  Airway Management Planned: LMA  Additional Equipment:   Intra-op Plan:   Post-operative Plan:   Informed Consent:   Plan Discussed with: CRNA  Anesthesia Plan Comments:         Anesthesia Quick Evaluation

## 2015-11-28 NOTE — Progress Notes (Signed)
[facility] PROGRESS NOTE  [name] [id number] DOB: 07-Feb-1922 DOA: 11/27/2015   PCP: Criselda Peaches, MD     Assessment/Plan: Principal Problem:   Hemorrhage of arteriovenous graft ([place]) Active Problems:   ESRD (end stage renal disease) ([place])   Fever in adult   HLD (hyperlipidemia)   Bleeding   Bleeding pseudoaneurysm of right brachiocephalic arteriovenous fistula ([place])   Bleeding from dialysis shunt [facility])   Nausea   82 y.o. female with ESRD on hemodialysis on Tuesday Thursday and Saturday started noticing bleeding from the right AV graft since this morning. Patient experienced at least 3 episodes of bleeding. In the ER patient was found to be mildly febrile. Patient has chronic cough. Patient had another episode of bleeding from the AV graft for which patient had dressing placed and presently bleeding is contained. ER physician did consult on-call vascular surgeon who will be seeing patient in consult  Assessment and plan  1. Right AV graft bleeding - hold aspirin and closely follow CBC. Vascular surgery consulted [place] this am,. Patient has been kept nothing by mouth in anticipation of procedure. Follow CBC 2. Fever - chest x-ray negative, could be from the bleeding graft. Follow blood cultures. Patient is placed on empiric antibiotics for now. Continue cefazolin 3. ESRD on hemodialysis - Tuesday Thursday-Saturday. Consult nephrology for dialysis. 4. Hyperlipidemia on statins. 5. Thrombocytopenia - follow CBC   DVT prophylaxsis SCDs  Code Status:  Full code    Family Communication: Discussed in detail with the patient, all imaging results, lab results explained to the patient   Disposition Plan:  [place] this am       Consultants:  Nephrology  Vascular surgery    Procedures:  None  Antibiotics: Vancomycin/Zosyn 1 Cefazolin     HPI/Subjective:  Patient states that she had surgery this morning  Objective: Filed Vitals:    11/28/15 0945 11/28/15 0948 11/28/15 1000 11/28/15 1003  BP:  109/38  112/41  Pulse: 76 55 57 61  Temp:    97.9 F (36.6 C)  TempSrc:      Resp: 20 20 18 20   Height:      Weight:      SpO2: 93% 93% 91% 93%    Intake/Output Summary (Last 24 hours) at 11/28/15 1148 Last data filed at 11/28/15 1000  Gross per 24 hour  Intake    800 ml  Output     50 ml  Net    750 ml    Exam:  Examination:  General exam: Appears calm and comfortable  Respiratory system: Clear to auscultation. Respiratory effort normal. Cardiovascular system: S1 & S2 heard, RRR. No JVD, murmurs, rubs, gallops or clicks. No pedal edema. Gastrointestinal system: Abdomen is nondistended, soft and nontender. No organomegaly or masses felt. Normal bowel sounds heard. Central nervous system: Alert and oriented. No focal neurological deficits. Extremities: Symmetric 5 x 5 power. Skin: No rashes, lesions or ulcers Psychiatry: Judgement and insight appear normal. Mood & affect appropriate.     Data Reviewed: I have personally reviewed following labs and imaging studies  Micro Results Recent Results (from the past 240 hour(s))  Blood Culture (routine x 2)     Status: None (Preliminary result)   Collection Time: 11/27/15  4:40 PM  Result Value Ref Range Status   Specimen Description BLOOD LEFT HAND  Final   Special Requests BOTTLES DRAWN AEROBIC AND ANAEROBIC 5CC  Final   Culture  Setup Time  Final    GRAM POSITIVE COCCI IN CLUSTERS IN BOTH AEROBIC AND ANAEROBIC BOTTLES Organism ID to follow CRITICAL RESULT CALLED TO, READ BACK BY AND VERIFIED WITH: T. STONE, PHARM AT 0902 ON [id number] BY S. YARBROUGH    Culture TOO YOUNG TO READ  Final   Report Status PENDING  Incomplete  Blood Culture ID Panel (Reflexed)     Status: Abnormal   Collection Time: 11/27/15  4:40 PM  Result Value Ref Range Status   Enterococcus species NOT DETECTED NOT DETECTED Final   Vancomycin resistance NOT DETECTED NOT DETECTED Final    Listeria monocytogenes NOT DETECTED NOT DETECTED Final   Staphylococcus species DETECTED (A) NOT DETECTED Final    Comment: CRITICAL RESULT CALLED TO, READ BACK BY AND VERIFIED WITH: T. STONE, PHARM AT 0902 ON [id number] BY S. YARBROUGH    Staphylococcus aureus DETECTED (A) NOT DETECTED Final    Comment: CRITICAL RESULT CALLED TO, READ BACK BY AND VERIFIED WITH: T. STONE, PHARM AT [id number] ON [id number] BY S. YARBROUGH    Methicillin resistance NOT DETECTED NOT DETECTED Final   Streptococcus species NOT DETECTED NOT DETECTED Final   Streptococcus agalactiae NOT DETECTED NOT DETECTED Final   Streptococcus pneumoniae NOT DETECTED NOT DETECTED Final   Streptococcus pyogenes NOT DETECTED NOT DETECTED Final   Acinetobacter baumannii NOT DETECTED NOT DETECTED Final   Enterobacteriaceae species NOT DETECTED NOT DETECTED Final   Enterobacter cloacae complex NOT DETECTED NOT DETECTED Final   Escherichia coli NOT DETECTED NOT DETECTED Final   Klebsiella oxytoca NOT DETECTED NOT DETECTED Final   Klebsiella pneumoniae NOT DETECTED NOT DETECTED Final   Proteus species NOT DETECTED NOT DETECTED Final   Serratia marcescens NOT DETECTED NOT DETECTED Final   Carbapenem resistance NOT DETECTED NOT DETECTED Final   Haemophilus influenzae NOT DETECTED NOT DETECTED Final   Neisseria meningitidis NOT DETECTED NOT DETECTED Final   Pseudomonas aeruginosa NOT DETECTED NOT DETECTED Final   Candida albicans NOT DETECTED NOT DETECTED Final   Candida glabrata NOT DETECTED NOT DETECTED Final   Candida krusei NOT DETECTED NOT DETECTED Final   Candida parapsilosis NOT DETECTED NOT DETECTED Final   Candida tropicalis NOT DETECTED NOT DETECTED Final  Blood Culture (routine x 2)     Status: None (Preliminary result)   Collection Time: 11/27/15  4:47 PM  Result Value Ref Range Status   Specimen Description BLOOD LEFT FOREARM  Final   Special Requests BOTTLES DRAWN AEROBIC AND ANAEROBIC 5CC  Final   Culture  Setup Time   Final     GRAM POSITIVE COCCI IN CLUSTERS IN BOTH AEROBIC AND ANAEROBIC BOTTLES CRITICAL RESULT CALLED TO, READ BACK BY AND VERIFIED WITH: T. STONE, PHARM AT 0902 ON [id number] BY S. YARBROUGH    Culture TOO YOUNG TO READ  Final   Report Status PENDING  Incomplete  MRSA PCR Screening     Status: None   Collection Time: 11/27/15  8:47 PM  Result Value Ref Range Status   MRSA by PCR NEGATIVE NEGATIVE Final    Comment:        The GeneXpert MRSA Assay (FDA approved for NASAL specimens only), is one component of a comprehensive MRSA colonization surveillance program. It is not intended to diagnose MRSA infection nor to guide or monitor treatment for MRSA infections.     Radiology Reports Dg Chest 2 View  11/27/2015  CLINICAL DATA:  Cough. EXAM: CHEST  2 VIEW COMPARISON:  None. FINDINGS: The heart, hila, and mediastinum are normal.  No focal infiltrates or overt edema. No acute abnormalities identified. IMPRESSION: No active cardiopulmonary disease. Electronically Signed   By: Dorise Bullion III M.D   On: 11/27/2015 17:54     CBC  Recent Labs Lab 11/27/15 1555 11/27/15 2030 11/28/15 0006 11/28/15 0419  WBC 6.1 5.1 4.7 4.2  HGB 12.8 10.9* 11.9* 10.7*  HCT 38.9 33.7* 37.3 33.9*  PLT 105* 99* 93* 88*  MCV 95.6 96.0 96.9 96.9  MCH 31.4 31.1 30.9 30.6  MCHC 32.9 32.3 31.9 31.6  RDW 15.1 15.1 15.3 15.3  LYMPHSABS 0.4*  --   --   --   MONOABS 0.3  --   --   --   EOSABS 0.0  --   --   --   BASOSABS 0.0  --   --   --     Chemistries   Recent Labs Lab 11/27/15 1555 11/28/15 0419  NA 136 133*  K 5.1 5.6*  CL 96* 96*  CO2 27 28  GLUCOSE 109* 110*  BUN 26* 38*  CREATININE 5.58* 6.33*  CALCIUM 10.2 9.3  AST 23 32  ALT 20 28  ALKPHOS 92 78  BILITOT 0.5 0.6   ------------------------------------------------------------------------------------------------------------------ estimated creatinine clearance is 5.1 mL/min (by C-G formula based on Cr of  6.33). ------------------------------------------------------------------------------------------------------------------ No results for input(s): HGBA1C in the last 72 hours. ------------------------------------------------------------------------------------------------------------------ No results for input(s): CHOL, HDL, LDLCALC, TRIG, CHOLHDL, LDLDIRECT in the last 72 hours. ------------------------------------------------------------------------------------------------------------------ No results for input(s): TSH, T4TOTAL, T3FREE, THYROIDAB in the last 72 hours.  Invalid input(s): FREET3 ------------------------------------------------------------------------------------------------------------------ No results for input(s): VITAMINB12, FOLATE, FERRITIN, TIBC, IRON, RETICCTPCT in the last 72 hours.  Coagulation profile  Recent Labs Lab 11/28/15 0419  INR 1.16    No results for input(s): DDIMER in the last 72 hours.  Cardiac Enzymes No results for input(s): CKMB, TROPONINI, MYOGLOBIN in the last 168 hours.  Invalid input(s): CK ------------------------------------------------------------------------------------------------------------------ Invalid input(s): POCBNP   CBG:  Recent Labs Lab 11/27/15 2110 11/28/15 0144 11/28/15 0555 11/28/15 1144  GLUCAP 96 115* 107* 144*       Studies: Dg Chest 2 View  11/27/2015  CLINICAL DATA:  Cough. EXAM: CHEST  2 VIEW COMPARISON:  None. FINDINGS: The heart, hila, and mediastinum are normal. No focal infiltrates or overt edema. No acute abnormalities identified. IMPRESSION: No active cardiopulmonary disease. Electronically Signed   By: Dorise Bullion III M.D   On: 11/27/2015 17:54      No results found for: HGBA1C Lab Results  Component Value Date   CREATININE 6.33* 11/28/2015       Scheduled Meds: . atorvastatin  10 mg Oral QHS  . calcium acetate  2,001 mg Oral TID WC  .  ceFAZolin (ANCEF) IV  2 g Intravenous Q  T,Th,Sa-HD  . cinacalcet  30 mg Oral Q breakfast  . [START ON 12/01/2015] darbepoetin (ARANESP) injection - DIALYSIS  60 mcg Intravenous Q Tue-HD  . multivitamin with minerals  1 tablet Oral Daily   Continuous Infusions: . sodium chloride 10 mL/hr at 11/28/15 0650     LOS: 0 days    Time spent: >30 MINS    [facility]  [facility] Pager [phone]. If 7PM-7AM, please contact night-coverage at [email or web address], password [facility] 11/28/2015, 11:48 AM  LOS: 0 days

## 2015-11-28 NOTE — Consult Note (Signed)
[facility]   HPI: [name] is a 82 y.o. female ESRD  HD TTS admitted to OBSERVATION  Last pm with febrile illness and reported bleeding from RFA [place] at home and in ER , Now SP  VVS revision   AVGG this am. We are consulted for HD and orders written for today. Will do Full consult note if Statis changed from Observation to Admit.  Noted I saw her this week Tuesday at OP unit with no Acces problems  And no cos .  Dialysis Orders: Center: [facility]  on TTS . EDW 60.0  HD Bath 2k,2.0  Time 4hr Heparin 4000. Access R FA AVG     Mircera 23mcg q 4weeks  (last given 11/03/15)     Other op labs  hgb 11.9 =11/26/15 Ca 10.3 phos 4.2  pth [place], PA-C [place] [phone] 11/28/2015, 10:52 AM   Patient seen and examined, agree with above note with above modifications.  Barbara Parish, MD 11/28/2015

## 2015-11-28 NOTE — Transfer of Care (Signed)
Immediate Anesthesia Transfer of Care Note  Patient: [name]  Procedure(s) Performed: Procedure(s): REVISION OF RIGHT ARM ARTERIOVENOUS GORETEX GRAFT (Right)  Patient Location: PACU  Anesthesia Type:MAC  Level of Consciousness: awake  Airway & Oxygen Therapy: Patient Spontanous Breathing  Post-op Assessment: Report given to RN and Post -op Vital signs reviewed and stable  Post vital signs: Reviewed and stable  Last Vitals:  Filed Vitals:   11/28/15 0557 11/28/15 0916  BP: 96/39 100/37  Pulse: 76 67  Temp: 38.1 C 36.7 C  Resp: 20 16    Last Pain:  Filed Vitals:   11/28/15 0921  PainSc: Asleep      Patients Stated Pain Goal: 2 ([id number])  Complications: No apparent anesthesia complications

## 2015-11-28 NOTE — Consult Note (Signed)
Consult Note  Patient name: [name] MRN: [id number] DOB: Mar 22, 1922 Sex: female  Consulting Physician:  ER  Reason for Consult:  Chief Complaint  Patient presents with  . Vascular Access Problem    HISTORY OF PRESENT ILLNESS: This is a 82 year old female with end-stage renal disease who has dialysis on Tuesday Thursday Saturday.  She noticed bleeding from her right arm graft earlier in the morning.  She has had several episodes of bleeding.  This has been controlled.  She was also febrile with a chronic cough.  She was admitted for possible sepsis.  She underwent revision of her graft in 72016  She suffers from hypercholesterolemia which is managed with a statin.  Past Medical History  Diagnosis Date  . HTN (hypertension)   . ESRD on hemodialysis ([place])   . Mild aortic stenosis   . Dyslipidemia   . TIA (transient ischemic attack)   . Gout   . Benign bladder mass   . DJD (degenerative joint disease)   . Anemia   . Obstructive sleep apnea     mild  . Chronic cough   . Heart murmur   . Pneumonia   . Orthostatic syncope     around year 2006    Past Surgical History  Procedure Laterality Date  . Nephrectomy      left  . Total abdominal hysterectomy    . Bone marrow biopsy    . Left arm dialysis graft    . Colonoscopy w/ biopsies and polypectomy    . Cataract extraction w/ intraocular lens  implant, bilateral    . Revision of arteriovenous goretex graft Right 11/24/2014    Procedure: REPLACEMENT OF  MEDIAL SIDE OF RIGHT FORARM ARTERIOVENOUS GORETEX GRAFT;  Surgeon: Conrad Sequim, MD;  Location: [facility];  Service: Vascular;  Laterality: Right;    Social History   Social History  . Marital Status: Widowed    Spouse Name: N/A  . Number of Children: N/A  . Years of Education: N/A   Occupational History  . Retired     Secretary/administrator   Social History Main Topics  . Smoking status: Never Smoker   . Smokeless tobacco: Never Used  . Alcohol Use: No  . Drug  Use: No  . Sexual Activity: No   Other Topics Concern  . Not on file   Social History Narrative    Family History  Problem Relation Age of Onset  . Hypertension Mother   . Diabetes Mother   . Hypertension Other     Allergies as of 11/27/2015 - Review Complete 11/27/2015  Allergen Reaction Noted  . Tape Other (See Comments) 11/27/2015  . Sulfa antibiotics Other (See Comments) 07/21/2010    No current facility-administered medications on file prior to encounter.   Current Outpatient Prescriptions on File Prior to Encounter  Medication Sig Dispense Refill  . acetaminophen (TYLENOL) 500 MG tablet Take 1,000 mg by mouth every 6 (six) hours as needed (pain).     Marland Kitchen aspirin 81 MG tablet Take 81 mg by mouth daily.      Marland Kitchen atorvastatin (LIPITOR) 10 MG tablet Take 10 mg by mouth at bedtime.      . calcium acetate (PHOSLO) 667 MG capsule Take 2,001 mg by mouth 3 (three) times daily with meals.    . Multiple Vitamin (MULTIVITAMIN) tablet Take 1 tablet by mouth daily. Reported on 11/27/2015    . SENSIPAR 30 MG tablet Take 30  mg by mouth daily.     . traMADol-acetaminophen (ULTRACET) 37.5-325 MG per tablet Take 1 tablet by mouth 3 (three) times daily. 20 tablet 0  . [DISCONTINUED] chlorpheniramine (CHLOR-TRIMETON) 4 MG tablet One pill at night as needed for cough 30 tablet 0     REVIEW OF SYSTEMS: Cardiovascular: Bleeding from right forearm graft Pulmonary: No productive cough, asthma or wheezing. Neurologic: No weakness, paresthesias, aphasia, or amaurosis. No dizziness. Hematologic: No bleeding problems or clotting disorders. Musculoskeletal: No joint pain or joint swelling. Gastrointestinal: No blood in stool or hematemesis Genitourinary: No dysuria or hematuria. Psychiatric:: No history of major depression. Integumentary: No rashes or ulcers. Constitutional: Positive fever  PHYSICAL EXAMINATION: General: The patient appears their stated age.  Vital signs are BP 96/39 mmHg  Pulse  76  Temp(Src) 100.5 F (38.1 C) (Oral)  Resp 20  Ht 5' 5"  (1.651 m)  Wt 130 lb 15.3 oz (59.4 kg)  BMI 21.79 kg/m2  SpO2 94% Pulmonary: Respirations are non-labored HEENT:  No gross abnormalitiesr  Musculoskeletal: There are no major deformities.   Neurologic: No focal weakness or paresthesias are detected, Skin: There are no ulcer or rashes noted. Psychiatric: The patient has normal affect. Cardiovascular: Palpable thrill within right forearm graft.  There is a small skin defect over top of the graft with active bleeding.  No obvious infection    Assessment:  Bleeding from right forearm dialysis graft Plan: Patient has a defect over top of the graft.  I do not think this will spontaneously resolve.  She needs to proceed with revision of her graft.  Because of the degenerative nature of this graft I suspect she will need to have the lateral aspect of the entire graft replaced.  I discussed the risks and benefits of this with the patient.  Currently it is hemostatic with a pressure dressing.  Therefore think we can do this for same morning.     Eldridge Abrahams, M.D. [facility] Office: [phone] Pager:  [phone]

## 2015-11-28 NOTE — Op Note (Signed)
    Patient name: SHEILAGH ROSSLER MRN: [id number] DOB: 24-Feb-1922 Sex: female  11/27/2015 - 11/28/2015 Pre-operative Diagnosis: Bleeding R FA AVGG Post-operative diagnosis:  Same Surgeon:  Annamarie Major Procedure:   #1: Revision of right forearm analysis graft with interposition 6 mm Gore-Tex.   #2: Resection of degenerated dialysis graft Anesthesia:  Mac Blood Loss:  See anesthesia record Specimens:  None  Findings:  A large defect in the existing venous limb of the right forearm dialysis graft was removed.  There was no evidence of infection.  I removed a proximally 75% of the existing graft.  It was well incorporated  Indications:  The patient presented to the emergency department with several episodes of bleeding from her forearm graft.  On examination she had a ulcer/degeneration of the skin above the graft.  She comes in for revision  Procedure:  The patient was identified in the holding area and taken to [place] [facility]  The patient was then placed supine on the table. MAC anesthesia was administered.  The patient was prepped and draped in the usual sterile fashion.  A time out was called and antibiotics were administered.  I made an incision at the apex of the graft and close to the venous anastomosis after anesthetizing the skin with 1% lidocaine.  Through these 2 incisions the existing graft was circumferentially dissected free.  It was well incorporated at this level.  I then created a tunnel lateral to the existing venous limb of the graft.  A 6 mm graft was brought through the tunnel.  The patient was fully heparinized.  After the heparin circulated the graft was occluded.  I transected the graft near the antecubital crease.  There was calcification within the graft which was removed.  The new 10 mm graft was sewn in a end to  end fashion with running CV 6 Gore-Tex suture.  The clamp on the venous limb was released and there was good backbleeding.  The graft was flushed with heparin saline  and reoccluded.  I then transected the graft at the apex of the existing graft.  The new graft was cut the appropriate length and a end-to-end anastomosis was created with running CV 6 Gore-Tex suture.  Prior to completion the appropriate flushing maneuvers were performed and the anastomosis was completed.  There was good hemostasis and a excellent thrill within the graft.  I then made an incision over the degenerated graft.  I resected a proximal 75% of the now defunctionalized graft.  It was resected in its entirety going towards the apex of the graft.  Residual 2 cm of the graft up near the antecubital crease was left.  As well incorporated.  50 mg of protamine was then given.  Once hemostasis was satisfactory the incisions were closed with 2 layers of 3-0 Vicryl.  Dermabond was applied.  There were no immediate compications.   Disposition:  To PACU in stable condition.   Theotis Burrow, M.D. [facility] Office: [phone] Pager:  [phone]

## 2015-11-29 ENCOUNTER — Inpatient Hospital Stay (HOSPITAL_COMMUNITY): Payer: Medicare Other

## 2015-11-29 ENCOUNTER — Encounter (HOSPITAL_COMMUNITY): Payer: Self-pay | Admitting: Surgery

## 2015-11-29 DIAGNOSIS — R7881 Bacteremia: Secondary | ICD-10-CM

## 2015-11-29 DIAGNOSIS — T82838D Hemorrhage of vascular prosthetic devices, implants and grafts, subsequent encounter: Secondary | ICD-10-CM

## 2015-11-29 DIAGNOSIS — B9561 Methicillin susceptible Staphylococcus aureus infection as the cause of diseases classified elsewhere: Secondary | ICD-10-CM

## 2015-11-29 LAB — CBC
HEMATOCRIT: 32.7 % — AB (ref 36.0–46.0)
HEMOGLOBIN: 10 g/dL — AB (ref 12.0–15.0)
MCH: 29.9 pg (ref 26.0–34.0)
MCHC: 30.6 g/dL (ref 30.0–36.0)
MCV: 97.6 fL (ref 78.0–100.0)
Platelets: 82 10*3/uL — ABNORMAL LOW (ref 150–400)
RBC: 3.35 MIL/uL — AB (ref 3.87–5.11)
RDW: 15.3 % (ref 11.5–15.5)
WBC: 4.1 10*3/uL (ref 4.0–10.5)

## 2015-11-29 LAB — COMPREHENSIVE METABOLIC PANEL
ALBUMIN: 2.9 g/dL — AB (ref 3.5–5.0)
ALK PHOS: 72 U/L (ref 38–126)
ALT: 31 U/L (ref 14–54)
AST: 33 U/L (ref 15–41)
Anion gap: 8 (ref 5–15)
BUN: 31 mg/dL — ABNORMAL HIGH (ref 6–20)
CHLORIDE: 99 mmol/L — AB (ref 101–111)
CO2: 29 mmol/L (ref 22–32)
CREATININE: 5.68 mg/dL — AB (ref 0.44–1.00)
Calcium: 9.3 mg/dL (ref 8.9–10.3)
GFR calc non Af Amer: 6 mL/min — ABNORMAL LOW (ref >60)
GFR, EST AFRICAN AMERICAN: 7 mL/min — AB (ref >60)
GLUCOSE: 94 mg/dL (ref 65–99)
Potassium: 4.8 mmol/L (ref 3.5–5.1)
SODIUM: 136 mmol/L (ref 135–145)
Total Bilirubin: 0.6 mg/dL (ref 0.3–1.2)
Total Protein: 5.5 g/dL — ABNORMAL LOW (ref 6.5–8.1)

## 2015-11-29 LAB — GLUCOSE, CAPILLARY
GLUCOSE-CAPILLARY: 96 mg/dL (ref 65–99)
GLUCOSE-CAPILLARY: 90 mg/dL (ref 65–99)
GLUCOSE-CAPILLARY: 127 mg/dL — AB (ref 65–99)
Glucose-Capillary: 105 mg/dL — ABNORMAL HIGH (ref 65–99)
Glucose-Capillary: 93 mg/dL (ref 65–99)

## 2015-11-29 LAB — ECHOCARDIOGRAM COMPLETE
Height: 65 in
WEIGHTICAEL: 2201.07 [oz_av]

## 2015-11-29 NOTE — Progress Notes (Signed)
  Echocardiogram 2D Echocardiogram has been performed.  Donata Clay 11/29/2015, 12:40 PM

## 2015-11-29 NOTE — Progress Notes (Signed)
INFECTIOUS DISEASE PROGRESS NOTE  ID: [name] is a 82 y.o. 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 (HCC)   Bleeding from dialysis shunt (H[facility])   Nausea  Subjective: Without complaints  Abtx:  Anti-infectives    Start     Dose/Rate Route Frequency Ordered Stop   11/28/15 1800  ceFAZolin (ANCEF) IVPB 2g/100 mL premix     2 g 200 mL/hr over 30 Minutes Intravenous Every T-Th-Sa (1800) 11/28/15 1414     11/28/15 1200  [MAR Hold]  vancomycin (VANCOCIN) 500 mg in sodium chloride 0.9 % 100 mL IVPB  Status:  Discontinued     (MAR Hold since 11/28/15 0626)   500 mg 100 mL/hr over 60 Minutes Intravenous Every T-Th-Sa (Hemodialysis) 11/27/15 2043 11/28/15 0916   11/28/15 1200  ceFAZolin (ANCEF) IVPB 2g/100 mL premix  Status:  Discontinued     2 g 200 mL/hr over 30 Minutes Intravenous Every T-Th-Sa (Hemodialysis) 11/28/15 0916 11/28/15 1414   11/28/15 0800  ceFAZolin (ANCEF) IVPB 1 g/50 mL premix  Status:  Discontinued    Comments:  Send with pt to OR   1 g 100 mL/hr over 30 Minutes Intravenous To Short Stay 11/28/15 0100 11/28/15 1015   11/28/15 0330  [MAR Hold]  piperacillin-tazobactam (ZOSYN) IVPB 2.25 g  Status:  Discontinued     (MAR Hold since 11/28/15 0626)   2.25 g 100 mL/hr over 30 Minutes Intravenous Every 8 hours 11/27/15 2043 11/28/15 0916   11/27/15 1930  vancomycin (VANCOCIN) 1,250 mg in sodium chloride 0.9 % 250 mL IVPB     1,250 mg 166.7 mL/hr over 90 Minutes Intravenous  Once 11/27/15 1901 11/27/15 2103   11/27/15 1915  piperacillin-tazobactam (ZOSYN) IVPB 3.375 g     3.375 g 100 mL/hr over 30 Minutes Intravenous  Once 11/27/15 1901 11/27/15 2010      Medications:  Scheduled: . atorvastatin  10 mg Oral QHS  . calcium acetate  2,001 mg Oral  TID WC  .  ceFAZolin (ANCEF) IV  2 g Intravenous Q T,Th,Sat-1800  . cinacalcet  30 mg Oral Q breakfast  . [START ON 12/01/2015] darbepoetin (ARANESP) injection - DIALYSIS  60 mcg Intravenous Q Tue-HD  . multivitamin with minerals  1 tablet Oral Daily    Objective: Vital signs in last 24 hours: Temp:  [97.8 F (36.6 C)-98.2 F (36.8 C)] 97.9 F (36.6 C) (07/23 0839) Pulse Rate:  [58-74] 67 (07/23 0839) Resp:  [15-21] 20 (07/23 0839) BP: (92-128)/(38-55) 128/55 (07/23 0839) SpO2:  [94 %-98 %] 96 % (07/23 0839) Weight:  [60.2 kg (132 lb 11.5 oz)-62.4 kg (137 lb 9.1 oz)] 62.4 kg (137 lb 9.1 oz) (07/22 2042)   General appearance: alert, cooperative and no distress Resp: clear to auscultation bilaterally Cardio: regular rate and rhythm GI: normal findings: bowel sounds normal and soft, non-tender Extremities: RUE wound clean. +bruit.   Lab Results  Recent Labs  11/28/15 0419 11/29/15 0551  WBC 4.2 4.1  HGB 10.7* 10.0*  HCT 33.9* 32.7*  NA 133* 136  K 5.6* 4.8  CL 96* 99*  CO2 28 29  BUN 38* 31*  CREATININE 6.33* 5.68*   Liver Panel  Recent Labs  11/28/15 0419 11/29/15 0551  PROT 5.7* 5.5*  ALBUMIN 3.3* 2.9*  AST 32 33  ALT 28 31  ALKPHOS 78 72  BILITOT 0.6 0.6   Sedimentation Rate No results for input(s): ESRSEDRATE in the last 72 hours. C-Reactive Protein No results for input(s): CRP in the last 72 hours.  Microbiology: Recent Results (from the past 240 hour(s))  Blood Culture (routine x 2)     Status: Abnormal (Preliminary result)   Collection Time: 11/27/15  4:40 PM  Result Value Ref Range Status   Specimen Description BLOOD LEFT HAND  Final   Special Requests BOTTLES DRAWN AEROBIC AND ANAEROBIC 5CC  Final   Culture  Setup Time   Final    GRAM POSITIVE COCCI IN CLUSTERS IN BOTH AEROBIC AND ANAEROBIC BOTTLES Organism ID to follow CRITICAL RESULT CALLED TO, READ BACK BY AND VERIFIED WITH: T. STONE, PHARM AT 0902 ON [id number] BY S. YARBROUGH    Culture  (A)  Final    STAPHYLOCOCCUS AUREUS SUSCEPTIBILITIES TO FOLLOW    Report Status PENDING  Incomplete  Blood Culture ID Panel (Reflexed)     Status: Abnormal   Collection Time: 11/27/15  4:40 PM  Result Value Ref Range Status   Enterococcus species NOT DETECTED NOT DETECTED Final   Vancomycin resistance NOT DETECTED NOT DETECTED Final   Listeria monocytogenes NOT DETECTED NOT DETECTED Final   Staphylococcus species DETECTED (A) NOT DETECTED Final    Comment: CRITICAL RESULT CALLED TO, READ BACK BY AND VERIFIED WITH: T. STONE, PHARM AT 0902 ON [id number] BY S. YARBROUGH    Staphylococcus aureus DETECTED (A) NOT DETECTED Final    Comment: CRITICAL RESULT CALLED TO, READ BACK BY AND VERIFIED WITH: T. STONE, PHARM AT [id number] ON [id number] BY S. YARBROUGH    Methicillin resistance NOT DETECTED NOT DETECTED Final   Streptococcus species NOT DETECTED NOT DETECTED Final   Streptococcus agalactiae NOT DETECTED NOT DETECTED Final   Streptococcus pneumoniae NOT DETECTED NOT DETECTED Final   Streptococcus pyogenes NOT DETECTED NOT DETECTED Final   Acinetobacter baumannii NOT DETECTED NOT DETECTED Final   Enterobacteriaceae species NOT DETECTED NOT DETECTED Final   Enterobacter cloacae complex NOT DETECTED NOT DETECTED Final   Escherichia coli NOT DETECTED NOT DETECTED Final   Klebsiella oxytoca NOT DETECTED NOT DETECTED Final   Klebsiella pneumoniae NOT DETECTED NOT DETECTED Final   Proteus species NOT DETECTED NOT DETECTED Final   Serratia marcescens NOT DETECTED NOT DETECTED Final   Carbapenem resistance NOT DETECTED NOT DETECTED Final   Haemophilus influenzae NOT DETECTED NOT DETECTED Final   Neisseria meningitidis NOT DETECTED NOT DETECTED Final   Pseudomonas aeruginosa NOT DETECTED NOT DETECTED Final   Candida albicans NOT DETECTED NOT DETECTED Final   Candida glabrata NOT DETECTED NOT DETECTED Final   Candida krusei NOT DETECTED NOT DETECTED Final   Candida parapsilosis NOT DETECTED NOT DETECTED  Final   Candida tropicalis NOT DETECTED NOT DETECTED Final  Blood Culture (routine x 2)     Status: Abnormal (Preliminary result)   Collection Time: 11/27/15  4:47 PM  Result Value Ref Range Status   Specimen Description BLOOD LEFT FOREARM  Final   Special Requests BOTTLES DRAWN AEROBIC AND ANAEROBIC 5CC  Final   Culture  Setup Time   Final    GRAM POSITIVE COCCI IN CLUSTERS IN BOTH AEROBIC AND ANAEROBIC BOTTLES CRITICAL RESULT CALLED TO, READ BACK BY AND VERIFIED WITH: T. STONE, PHARM AT [id number] ON [id number] BY Rhea Bleacher    Culture STAPHYLOCOCCUS AUREUS (A)  Final   Report Status PENDING  Incomplete  MRSA PCR Screening     Status: None   Collection Time: 11/27/15  8:47 PM  Result Value Ref Range Status   MRSA by PCR NEGATIVE NEGATIVE Final    Comment:        The GeneXpert MRSA Assay (FDA approved for NASAL specimens only), is one component of a comprehensive MRSA colonization surveillance program. It is not intended to diagnose MRSA infection nor to guide or monitor treatment for MRSA infections.     Studies/Results: Dg Chest 2 View  Result Date: 11/27/2015 CLINICAL DATA:  Cough. EXAM: CHEST  2 VIEW COMPARISON:  None. FINDINGS: The heart, hila, and mediastinum are normal. No focal infiltrates or overt edema. No acute abnormalities identified. IMPRESSION: No active cardiopulmonary disease. Electronically Signed   By: Dorise Bullion III M.D   On: 11/27/2015 17:54     Assessment/Plan: Staph bacteremia Start ancef with pharm assistance Repeat BCx pending TTE pending Would defer checking TEE due to her age and co-morbidities.   ESRD Followed by renal T/TH/S HD   Bleeding from AVG Revised 7-22  Total days of antibiotics: 1 ancef         Bobby Rumpf Infectious Diseases (pager) [phone] [email or web address] 11/29/2015, 12:54 PM  LOS: 1 day

## 2015-11-29 NOTE — Progress Notes (Signed)
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)   Bleeding from dialysis shunt [facility])   Nausea   82 y.o. female with ESRD on hemodialysis on Tuesday Thursday and Saturday started noticing bleeding from the right AV graft since this morning. Patient experienced at least 3 episodes of bleeding. In the ER patient was found to be mildly febrile. Patient has chronic cough. Patient had another episode of bleeding from the AV graft for which patient had dressing placed and presently bleeding is contained. ER physician did consult on-call vascular surgeon who will be seeing patient in consult  Assessment and plan  1. Right AV graft bleeding - hold aspirin and closely follow CBC. Vascular surgery consulted . Revision of right forearm analysis graft 7/22 ,. No further bleeding 2. MSSA bacteremia - chest x-ray negative, could be from the bleeding graft. Follow blood cultures. Patient is placed on empiric antibiotics for now. on ancef- ID has seen recommending TTE-discussed with Dr. Rayburn Go tomorrow, may not need if TTE echo given age  anticipate this will be done tomorrow. Patient also has a transthoracic echo which may be done today 3. ESRD on hemodialysis - Tuesday Thursday-Saturday. Consult nephrology for dialysis. 4. Hyperlipidemia on statins. 5. Thrombocytopenia - platelets in the range of 78-99, stable   DVT prophylaxsis SCDs  Code Status:  Full code    Family Communication: Discussed in detail with the patient, all imaging results, lab results explained to the patient   Disposition Plan:  TEE tomorrow, may not need if TTE echo given age         Consultants:  Nephrology  Vascular surgery    Procedures:  None  Antibiotics: Vancomycin/Zosyn 1 Cefazolin     HPI/Subjective:  Patient states that she had surgery this morning  Objective: Vitals:   11/28/15 1700 11/28/15 2042 11/29/15 0443 11/29/15 0839  BP: (!) 112/54 (!) 102/38 (!) 109/46 (!) 128/55  Pulse: 65 73 74 67  Resp: 18 19 20 20   Temp: 98 F (36.7 C) 98.1 F (36.7 C) 98.2 F (36.8 C) 97.9 F (36.6 C)  TempSrc: Oral Oral Oral Oral  SpO2: 98% 96% 94% 96%  Weight: 60.2 kg (132 lb 11.5 oz) 62.4 kg (137 lb 9.1 oz)    Height:        Intake/Output Summary (Last 24 hours) at 11/29/15 0915 Last data filed at 11/29/15 0840  Gross per 24 hour  Intake              660 ml  Output              400 ml  Net              260 ml    Exam:  Examination:  General exam: Appears calm and comfortable  Respiratory system: Clear to auscultation. Respiratory effort normal. Cardiovascular system: S1 & S2 heard, RRR. No JVD, murmurs, rubs, gallops or clicks. No pedal edema. Gastrointestinal system: Abdomen is nondistended, soft and nontender. No organomegaly or masses felt. Normal bowel sounds heard. Central nervous system: Alert and oriented. No focal neurological deficits. Extremities: Symmetric 5 x 5 power. Skin: No  rashes, lesions or ulcers Psychiatry: Judgement and insight appear normal. Mood & affect appropriate.     Data Reviewed: I have personally reviewed following labs and imaging studies  Micro Results Recent Results (from the past 240 hour(s))  Blood Culture (routine x 2)     Status: None (Preliminary result)   Collection Time: 11/27/15  4:40 PM  Result Value Ref Range Status   Specimen Description BLOOD LEFT HAND  Final   Special Requests BOTTLES DRAWN AEROBIC AND ANAEROBIC 5CC  Final   Culture  Setup Time   Final    GRAM POSITIVE COCCI IN CLUSTERS IN BOTH AEROBIC AND ANAEROBIC BOTTLES Organism ID to follow CRITICAL RESULT CALLED TO,  READ BACK BY AND VERIFIED WITH: T. STONE, PHARM AT 0902 ON [id number] BY S. YARBROUGH    Culture TOO YOUNG TO READ  Final   Report Status PENDING  Incomplete  Blood Culture ID Panel (Reflexed)     Status: Abnormal   Collection Time: 11/27/15  4:40 PM  Result Value Ref Range Status   Enterococcus species NOT DETECTED NOT DETECTED Final   Vancomycin resistance NOT DETECTED NOT DETECTED Final   Listeria monocytogenes NOT DETECTED NOT DETECTED Final   Staphylococcus species DETECTED (A) NOT DETECTED Final    Comment: CRITICAL RESULT CALLED TO, READ BACK BY AND VERIFIED WITH: T. STONE, PHARM AT 0902 ON [id number] BY S. YARBROUGH    Staphylococcus aureus DETECTED (A) NOT DETECTED Final    Comment: CRITICAL RESULT CALLED TO, READ BACK BY AND VERIFIED WITH: T. STONE, PHARM AT [id number] ON [id number] BY S. YARBROUGH    Methicillin resistance NOT DETECTED NOT DETECTED Final   Streptococcus species NOT DETECTED NOT DETECTED Final   Streptococcus agalactiae NOT DETECTED NOT DETECTED Final   Streptococcus pneumoniae NOT DETECTED NOT DETECTED Final   Streptococcus pyogenes NOT DETECTED NOT DETECTED Final   Acinetobacter baumannii NOT DETECTED NOT DETECTED Final   Enterobacteriaceae species NOT DETECTED NOT DETECTED Final   Enterobacter cloacae complex NOT DETECTED NOT DETECTED Final   Escherichia coli NOT DETECTED NOT DETECTED Final   Klebsiella oxytoca NOT DETECTED NOT DETECTED Final   Klebsiella pneumoniae NOT DETECTED NOT DETECTED Final   Proteus species NOT DETECTED NOT DETECTED Final   Serratia marcescens NOT DETECTED NOT DETECTED Final   Carbapenem resistance NOT DETECTED NOT DETECTED Final   Haemophilus influenzae NOT DETECTED NOT DETECTED Final   Neisseria meningitidis NOT DETECTED NOT DETECTED Final   Pseudomonas aeruginosa NOT DETECTED NOT DETECTED Final   Candida albicans NOT DETECTED NOT DETECTED Final   Candida glabrata NOT DETECTED NOT DETECTED Final   Candida krusei NOT DETECTED NOT DETECTED  Final   Candida parapsilosis NOT DETECTED NOT DETECTED Final   Candida tropicalis NOT DETECTED NOT DETECTED Final  Blood Culture (routine x 2)     Status: None (Preliminary result)   Collection Time: 11/27/15  4:47 PM  Result Value Ref Range Status   Specimen Description BLOOD LEFT FOREARM  Final   Special Requests BOTTLES DRAWN AEROBIC AND ANAEROBIC 5CC  Final   Culture  Setup Time   Final    GRAM POSITIVE COCCI IN CLUSTERS IN BOTH AEROBIC AND ANAEROBIC BOTTLES CRITICAL RESULT CALLED TO, READ BACK BY AND VERIFIED WITH: T. STONE, PHARM AT 0902 ON [id number] BY Rhea Bleacher    Culture TOO YOUNG TO READ  Final   Report Status PENDING  Incomplete  MRSA PCR Screening     Status: None   Collection Time: 11/27/15  8:47  PM  Result Value Ref Range Status   MRSA by PCR NEGATIVE NEGATIVE Final    Comment:        The GeneXpert MRSA Assay (FDA approved for NASAL specimens only), is one component of a comprehensive MRSA colonization surveillance program. It is not intended to diagnose MRSA infection nor to guide or monitor treatment for MRSA infections.     Radiology Reports Dg Chest 2 View  Result Date: 11/27/2015 CLINICAL DATA:  Cough. EXAM: CHEST  2 VIEW COMPARISON:  None. FINDINGS: The heart, hila, and mediastinum are normal. No focal infiltrates or overt edema. No acute abnormalities identified. IMPRESSION: No active cardiopulmonary disease. Electronically Signed   By: Dorise Bullion III M.D   On: 11/27/2015 17:54     CBC  Recent Labs Lab 11/27/15 1555 11/27/15 2030 11/28/15 0006 11/28/15 0419 11/29/15 0551  WBC 6.1 5.1 4.7 4.2 4.1  HGB 12.8 10.9* 11.9* 10.7* 10.0*  HCT 38.9 33.7* 37.3 33.9* 32.7*  PLT 105* 99* 93* 88* 82*  MCV 95.6 96.0 96.9 96.9 97.6  MCH 31.4 31.1 30.9 30.6 29.9  MCHC 32.9 32.3 31.9 31.6 30.6  RDW 15.1 15.1 15.3 15.3 15.3  LYMPHSABS 0.4*  --   --   --   --   MONOABS 0.3  --   --   --   --   EOSABS 0.0  --   --   --   --   BASOSABS 0.0  --   --   --    --     Chemistries   Recent Labs Lab 11/27/15 1555 11/28/15 0419 11/29/15 0551  NA 136 133* 136  K 5.1 5.6* 4.8  CL 96* 96* 99*  CO2 27 28 29   GLUCOSE 109* 110* 94  BUN 26* 38* 31*  CREATININE 5.58* 6.33* 5.68*  CALCIUM 10.2 9.3 9.3  AST 23 32 33  ALT 20 28 31   ALKPHOS 92 78 72  BILITOT 0.5 0.6 0.6   ------------------------------------------------------------------------------------------------------------------ estimated creatinine clearance is 5.7 mL/min (by C-G formula based on SCr of 5.68 mg/dL). ------------------------------------------------------------------------------------------------------------------ No results for input(s): HGBA1C in the last 72 hours. ------------------------------------------------------------------------------------------------------------------ No results for input(s): CHOL, HDL, LDLCALC, TRIG, CHOLHDL, LDLDIRECT in the last 72 hours. ------------------------------------------------------------------------------------------------------------------ No results for input(s): TSH, T4TOTAL, T3FREE, THYROIDAB in the last 72 hours.  Invalid input(s): FREET3 ------------------------------------------------------------------------------------------------------------------ No results for input(s): VITAMINB12, FOLATE, FERRITIN, TIBC, IRON, RETICCTPCT in the last 72 hours.  Coagulation profile  Recent Labs Lab 11/28/15 0419  INR 1.16    No results for input(s): DDIMER in the last 72 hours.  Cardiac Enzymes No results for input(s): CKMB, TROPONINI, MYOGLOBIN in the last 168 hours.  Invalid input(s): CK ------------------------------------------------------------------------------------------------------------------ Invalid input(s): POCBNP   CBG:  Recent Labs Lab 11/28/15 0555 11/28/15 1144 11/29/15 0012 11/29/15 0557 11/29/15 0751  GLUCAP 107* 144* 105* 93 96       Studies: Dg Chest 2 View  Result Date:  11/27/2015 CLINICAL DATA:  Cough. EXAM: CHEST  2 VIEW COMPARISON:  None. FINDINGS: The heart, hila, and mediastinum are normal. No focal infiltrates or overt edema. No acute abnormalities identified. IMPRESSION: No active cardiopulmonary disease. Electronically Signed   By: Dorise Bullion III M.D   On: 11/27/2015 17:54      No results found for: HGBA1C Lab Results  Component Value Date   CREATININE 5.68 (H) 11/29/2015       Scheduled Meds: . atorvastatin  10 mg Oral QHS  . calcium acetate  2,001 mg Oral  TID WC  .  ceFAZolin (ANCEF) IV  2 g Intravenous Q T,Th,Sat-1800  . cinacalcet  30 mg Oral Q breakfast  . [START ON 12/01/2015] darbepoetin (ARANESP) injection - DIALYSIS  60 mcg Intravenous Q Tue-HD  . multivitamin with minerals  1 tablet Oral Daily   Continuous Infusions: . sodium chloride 10 mL/hr at 11/28/15 0650     LOS: 1 day    Time spent: >30 MINS    [facility] Pager [phone]. If 7PM-7AM, please contact night-coverage at [email or web address], password [facility] 11/29/2015, 9:15 AM  LOS: 1 day

## 2015-11-29 NOTE — Plan of Care (Signed)
Problem: Nutrition: Goal: Adequate nutrition will be maintained Outcome: Not Progressing Patient with a very poor appetite today. Refused lunch and supper.

## 2015-11-29 NOTE — Progress Notes (Signed)
Subjective  - POD #1  No complaints Tolerated HD yesterday   Physical Exam:  Incisions clean Palpable thrill in [place]       Assessment/Plan:  POD #1  No intra-operative evidence of infection if AVGG Do not stick lateral half of graft for 1 month Stable from vascular standpoint for dicharge  [name], [place] 11/29/2015 11:26 AM --  Vitals:   11/29/15 0443 11/29/15 0839  BP: (!) 109/46 (!) 128/55  Pulse: 74 67  Resp: 20 20  Temp: 98.2 F (36.8 C) 97.9 F (36.6 C)    Intake/Output Summary (Last 24 hours) at 11/29/15 1126 Last data filed at 11/29/15 0840  Gross per 24 hour  Intake              460 ml  Output              400 ml  Net               60 ml     Laboratory CBC    Component Value Date/Time   WBC 4.1 11/29/2015 0551   HGB 10.0 (L) 11/29/2015 0551   HCT 32.7 (L) 11/29/2015 0551   PLT 82 (L) 11/29/2015 0551    BMET    Component Value Date/Time   NA 136 11/29/2015 0551   K 4.8 11/29/2015 0551   CL 99 (L) 11/29/2015 0551   CO2 29 11/29/2015 0551   GLUCOSE 94 11/29/2015 0551   BUN 31 (H) 11/29/2015 0551   CREATININE 5.68 (H) 11/29/2015 0551   CALCIUM 9.3 11/29/2015 0551   GFRNONAA 6 (L) 11/29/2015 0551   GFRAA 7 (L) 11/29/2015 0551    COAG Lab Results  Component Value Date   INR 1.16 11/28/2015   No results found for: PTT  Antibiotics Anti-infectives    Start     Dose/Rate Route Frequency Ordered Stop   11/28/15 1800  ceFAZolin (ANCEF) IVPB 2g/100 mL premix     2 g 200 mL/hr over 30 Minutes Intravenous Every T-Th-Sa (1800) 11/28/15 1414     11/28/15 1200  [MAR Hold]  vancomycin (VANCOCIN) 500 mg in sodium chloride 0.9 % 100 mL IVPB  Status:  Discontinued     (MAR Hold since 11/28/15 0626)   500 mg 100 mL/hr over 60 Minutes Intravenous Every T-Th-Sa (Hemodialysis) 11/27/15 2043 11/28/15 0916   11/28/15 1200  ceFAZolin (ANCEF) IVPB 2g/100 mL premix  Status:  Discontinued     2 g 200 mL/hr over 30 Minutes Intravenous Every  T-Th-Sa (Hemodialysis) 11/28/15 0916 11/28/15 1414   11/28/15 0800  ceFAZolin (ANCEF) IVPB 1 g/50 mL premix  Status:  Discontinued    Comments:  Send with pt to OR   1 g 100 mL/hr over 30 Minutes Intravenous To Short Stay 11/28/15 0100 11/28/15 1015   11/28/15 0330  [MAR Hold]  piperacillin-tazobactam (ZOSYN) IVPB 2.25 g  Status:  Discontinued     (MAR Hold since 11/28/15 0626)   2.25 g 100 mL/hr over 30 Minutes Intravenous Every 8 hours 11/27/15 2043 11/28/15 0916   11/27/15 1930  vancomycin (VANCOCIN) 1,250 mg in sodium chloride 0.9 % 250 mL IVPB     1,250 mg 166.7 mL/hr over 90 Minutes Intravenous  Once 11/27/15 1901 11/27/15 2103   11/27/15 1915  piperacillin-tazobactam (ZOSYN) IVPB 3.375 g     3.375 g 100 mL/hr over 30 Minutes Intravenous  Once 11/27/15 1901 11/27/15 2010       V. Leia Alf, M.D. [facility] [place] Office: [phone] Pager:  [phone]

## 2015-11-29 NOTE — Progress Notes (Signed)
Subjective:  Hd last night- only removed 400- she did not have a pleasant experience with the sticking "I dont think he knew what he was doing "  BP has been soft - she is hoping that she will be able to go home today  Objective Vital signs in last 24 hours: Vitals:   11/28/15 1630 11/28/15 1700 11/28/15 2042 11/29/15 0443  BP: (!) 121/44 (!) 112/54 (!) 102/38 (!) 109/46  Pulse: 73 65 73 74  Resp: 18 18 19 20   Temp:  98 F (36.7 C) 98.1 F (36.7 C) 98.2 F (36.8 C)  TempSrc:  Oral Oral Oral  SpO2:  98% 96% 94%  Weight:  60.2 kg (132 lb 11.5 oz) 62.4 kg (137 lb 9.1 oz)   Height:       Weight change: 0.2 kg (7.1 oz)  Intake/Output Summary (Last 24 hours) at 11/29/15 [id number] Last data filed at 11/29/15 0604  Gross per 24 hour  Intake              720 ml  Output              450 ml  Net              270 ml    Assessment/Plan: 82 year old female with hyperlipidemia and ESRD who presented with bleeding from AVG s/p revision as well as MSSA bacteremia 1 MSSA bacteremia- on ancef- ID has seen recommending TTE- could this be related to many recent episodes of AVG bleeding or graft degeneration ?   2. 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 Anemia of ESRD: hgb > 10- continue ESA now in form of darbe 5. Metabolic Bone Disease: continue home meds of phoslo 3 TID with meals, sensipar 30 daily- does not appear to be on vitamin D as OP 6. Dispo- we can continue to give ancef as an OP to treat her bacteremia- can treat for 4 weeks with surveillance cultures- not sure how plan would change significantly if we knew she had endocarditis- would not be a candidate for much at her age.  Renal would be OK with discharge home today     [place]  A    Labs: Basic Metabolic Panel:  Recent Labs Lab 11/27/15 1555 11/28/15 0419 11/29/15 0551  NA 136 133* 136  K 5.1 5.6* 4.8  CL 96* 96* 99*  CO2 27 28 29   GLUCOSE 109* 110* 94  BUN 26* 38* 31*  CREATININE 5.58* 6.33* 5.68*  CALCIUM 10.2 9.3 9.3   Liver Function Tests:  Recent Labs Lab 11/27/15 1555 11/28/15 0419 11/29/15 0551  AST 23 32 33  ALT 20 28 31   ALKPHOS 92 78 72  BILITOT 0.5 0.6 0.6  PROT 6.5 5.7* 5.5*  ALBUMIN 4.1 3.3* 2.9*   No results for input(s): LIPASE, AMYLASE in the last 168 hours. No results for input(s): AMMONIA in the last 168 hours. CBC:  Recent Labs Lab 11/27/15 1555 11/27/15 2030 11/28/15 0006 11/28/15 0419 11/29/15 0551  WBC 6.1 5.1 4.7 4.2 4.1  NEUTROABS 5.4  --   --   --   --   HGB 12.8 10.9* 11.9* 10.7* 10.0*  HCT 38.9  33.7* 37.3 33.9* 32.7*  MCV 95.6 96.0 96.9 96.9 97.6  PLT 105* 99* 93* 88* 82*   Cardiac Enzymes: No results for input(s): CKTOTAL, CKMB, CKMBINDEX, TROPONINI in the last 168 hours. CBG:  Recent Labs Lab 11/28/15 0555 11/28/15 1144 11/29/15 0012 11/29/15 0557 11/29/15 0751  GLUCAP 107* 144* 105* 93 96    Iron Studies: No results for input(s): IRON, TIBC, TRANSFERRIN, FERRITIN in the last 72 hours. Studies/Results: Dg Chest 2 View  Result Date: 11/27/2015 CLINICAL DATA:  Cough. EXAM: CHEST  2 VIEW COMPARISON:  None. FINDINGS: The heart, hila, and mediastinum are normal. No focal infiltrates or overt edema. No acute abnormalities identified. IMPRESSION: No active cardiopulmonary disease. Electronically Signed   By: Dorise Bullion III M.D   On: 11/27/2015 17:54   Medications: Infusions: . sodium chloride 10 mL/hr at 11/28/15 0650    Scheduled Medications: . atorvastatin  10 mg Oral QHS  . calcium acetate  2,001 mg Oral TID WC  .  ceFAZolin (ANCEF) IV  2 g Intravenous Q T,Th,Sat-1800  . cinacalcet  30 mg Oral Q breakfast  . [START ON 12/01/2015] darbepoetin (ARANESP) injection - DIALYSIS  60 mcg  Intravenous Q Tue-HD  . multivitamin with minerals  1 tablet Oral Daily    have reviewed scheduled and prn medications.  Physical Exam: General: NAD, pleasant Heart: RRR Lungs: clear Abdomen: soft, non tender Extremities: no edema Dialysis Access: right AVG- revision 7/22    11/29/2015,8:37 AM  LOS: 1 day

## 2015-11-30 ENCOUNTER — Telehealth: Payer: Self-pay | Admitting: Surgery

## 2015-11-30 LAB — COMPREHENSIVE METABOLIC PANEL
ALK PHOS: 74 U/L (ref 38–126)
ALT: 29 U/L (ref 14–54)
ANION GAP: 11 (ref 5–15)
AST: 28 U/L (ref 15–41)
Albumin: 3 g/dL — ABNORMAL LOW (ref 3.5–5.0)
BUN: 45 mg/dL — ABNORMAL HIGH (ref 6–20)
CALCIUM: 8.8 mg/dL — AB (ref 8.9–10.3)
CO2: 26 mmol/L (ref 22–32)
Chloride: 98 mmol/L — ABNORMAL LOW (ref 101–111)
Creatinine, Ser: 7.35 mg/dL — ABNORMAL HIGH (ref 0.44–1.00)
GFR calc non Af Amer: 4 mL/min — ABNORMAL LOW (ref >60)
GFR, EST AFRICAN AMERICAN: 5 mL/min — AB (ref >60)
Glucose, Bld: 86 mg/dL (ref 65–99)
Potassium: 5.1 mmol/L (ref 3.5–5.1)
SODIUM: 135 mmol/L (ref 135–145)
TOTAL PROTEIN: 5.8 g/dL — AB (ref 6.5–8.1)
Total Bilirubin: 0.6 mg/dL (ref 0.3–1.2)

## 2015-11-30 LAB — CULTURE, BLOOD (ROUTINE X 2)

## 2015-11-30 LAB — CBC
HEMATOCRIT: 31.9 % — AB (ref 36.0–46.0)
Hemoglobin: 10.2 g/dL — ABNORMAL LOW (ref 12.0–15.0)
MCH: 30.8 pg (ref 26.0–34.0)
MCHC: 32 g/dL (ref 30.0–36.0)
MCV: 96.4 fL (ref 78.0–100.0)
Platelets: 85 10*3/uL — ABNORMAL LOW (ref 150–400)
RBC: 3.31 MIL/uL — ABNORMAL LOW (ref 3.87–5.11)
RDW: 15.2 % (ref 11.5–15.5)
WBC: 3.9 10*3/uL — AB (ref 4.0–10.5)

## 2015-11-30 MED ORDER — SUCROFERRIC OXYHYDROXIDE 500 MG PO CHEW
500.0000 mg | CHEWABLE_TABLET | Freq: Three times a day (TID) | ORAL | Status: DC
  Filled 2015-11-30 (×2): qty 1

## 2015-11-30 MED ORDER — CEFAZOLIN SODIUM-DEXTROSE 2-4 GM/100ML-% IV SOLN: 2.0000 g | INTRAVENOUS | 0 refills | Status: AC

## 2015-11-30 NOTE — Care Management Important Message (Signed)
Important Message  Patient Details  Name: [name] MRN: [id number] Date of Birth: [birth year]   Medicare Important Message Given:  Yes    Loann Quill 11/30/2015, 9:07 AM

## 2015-11-30 NOTE — Progress Notes (Signed)
[name] Friday to be D/C'd Home per MD order.  Discussed prescriptions and follow up appointments with the patient. Prescriptions given to patient, medication list explained in detail. Pt verbalized understanding.    Medication List    TAKE these medications   acetaminophen 500 MG tablet Commonly known as:  TYLENOL Take 1,000 mg by mouth every 6 (six) hours as needed (pain).   aspirin 81 MG tablet Take 81 mg by mouth daily.   atorvastatin 10 MG tablet Commonly known as:  LIPITOR Take 10 mg by mouth at bedtime.   calcium acetate 667 MG capsule Commonly known as:  PHOSLO Take 2,001 mg by mouth 3 (three) times daily with meals.   ceFAZolin 2-4 GM/100ML-% IVPB Commonly known as:  ANCEF Inject 100 mLs (2 g total) into the vein every Tuesday, Thursday, and Saturday at 6 PM. Start taking on:  12/01/2015   multivitamin tablet Take 1 tablet by mouth daily. Reported on 11/27/2015   SENSIPAR 30 MG tablet Generic drug:  cinacalcet Take 30 mg by mouth daily.   traMADol-acetaminophen 37.5-325 MG tablet Commonly known as:  ULTRACET Take 1 tablet by mouth 3 (three) times daily.       Vitals:   11/30/15 0501 11/30/15 0923  BP: (!) 129/41 (!) 140/48  Pulse: 69 67  Resp: 19 18  Temp: 97.6 F (36.4 C) 97.9 F (36.6 C)    Skin clean, dry and intact without evidence of skin break down, no evidence of skin tears noted. IV catheter discontinued intact. Site without signs and symptoms of complications. Dressing and pressure applied. Pt denies pain at this time. No complaints noted.  An After Visit Summary was printed and given to the patient. Patient escorted via [place], and D/C home via private auto.  Haywood Lasso BSN, RN [facility] Phone [place] to be D/C'd Home per MD order.  Discussed prescriptions and follow up appointments with the patient. Prescriptions given to patient, medication list explained in detail. Pt verbalized understanding.    Medication List     TAKE these medications   acetaminophen 500 MG tablet Commonly known as:  TYLENOL Take 1,000 mg by mouth every 6 (six) hours as needed (pain).   aspirin 81 MG tablet Take 81 mg by mouth daily.   atorvastatin 10 MG tablet Commonly known as:  LIPITOR Take 10 mg by mouth at bedtime.   calcium acetate 667 MG capsule Commonly known as:  PHOSLO Take 2,001 mg by mouth 3 (three) times daily with meals.   ceFAZolin 2-4 GM/100ML-% IVPB Commonly known as:  ANCEF Inject 100 mLs (2 g total) into the vein every Tuesday, Thursday, and Saturday at 6 PM. Start taking on:  12/01/2015   multivitamin tablet Take 1 tablet by mouth daily. Reported on 11/27/2015   SENSIPAR 30 MG tablet Generic drug:  cinacalcet Take 30 mg by mouth daily.   traMADol-acetaminophen 37.5-325 MG tablet Commonly known as:  ULTRACET Take 1 tablet by mouth 3 (three) times daily.       Vitals:   11/30/15 0501 11/30/15 0923  BP: (!) 129/41 (!) 140/48  Pulse: 69 67  Resp: 19 18  Temp: 97.6 F (36.4 C) 97.9 F (36.6 C)    Skin clean, dry and intact without evidence of skin break down, no evidence of skin tears noted. IV catheter discontinued intact. Site without signs and symptoms of complications. Dressing and pressure applied. Pt denies pain at this time. No complaints noted.  An After Visit  Summary was printed and given to the patient. Patient escorted via [place], and D/C home via private auto.  Haywood Lasso BSN, RN [facility] Phone [place] to be D/C'd Home per MD order.  Discussed prescriptions and follow up appointments with the patient. Prescriptions given to patient, medication list explained in detail. Pt verbalized understanding.    Medication List    TAKE these medications   acetaminophen 500 MG tablet Commonly known as:  TYLENOL Take 1,000 mg by mouth every 6 (six) hours as needed (pain).   aspirin 81 MG tablet Take 81 mg by mouth daily.   atorvastatin 10 MG tablet Commonly known  as:  LIPITOR Take 10 mg by mouth at bedtime.   calcium acetate 667 MG capsule Commonly known as:  PHOSLO Take 2,001 mg by mouth 3 (three) times daily with meals.   ceFAZolin 2-4 GM/100ML-% IVPB Commonly known as:  ANCEF Inject 100 mLs (2 g total) into the vein every Tuesday, Thursday, and Saturday at 6 PM. Start taking on:  12/01/2015   multivitamin tablet Take 1 tablet by mouth daily. Reported on 11/27/2015   SENSIPAR 30 MG tablet Generic drug:  cinacalcet Take 30 mg by mouth daily.   traMADol-acetaminophen 37.5-325 MG tablet Commonly known as:  ULTRACET Take 1 tablet by mouth 3 (three) times daily.       Vitals:   11/30/15 0501 11/30/15 0923  BP: (!) 129/41 (!) 140/48  Pulse: 69 67  Resp: 19 18  Temp: 97.6 F (36.4 C) 97.9 F (36.6 C)    Skin clean, dry and intact without evidence of skin break down, no evidence of skin tears noted. IV catheter discontinued intact. Site without signs and symptoms of complications. Dressing and pressure applied. Pt denies pain at this time. No complaints noted.  An After Visit Summary was printed and given to the patient. Patient escorted via [place], and D/C home via private auto.  Haywood Lasso BSN, RN [facility] Phone [phone]

## 2015-11-30 NOTE — Progress Notes (Signed)
Called by Dr. Allyson Sabal to comment on TTE over the weekend. Concern for endocarditis - this was not definitively demonstrated, but cannot be excluded as there is severe calcific aortic stenosis (apparently progressive since she was seen by Dr. Percival Spanish in 2012). I don't see an old echo for comparison. She does not sound like an ideal TEE candidate - and TEE may not be able to fully exclude endocarditis. Would consider treatment for 6 week course presuming infection of the valve. Could see Dr. Percival Spanish back in follow-up in the office to discuss severe AS, however, options are probably limited for surgery.  Pixie Casino, MD, [facility] Attending Cardiologist [place]

## 2015-11-30 NOTE — Telephone Encounter (Signed)
Left message for patient and her son to call us back to schedule a post op appointment within 1 to 2 weeks of discharge.

## 2015-11-30 NOTE — Progress Notes (Signed)
[facility] Progress Note   Subjective:  "I'm worried about my arm-I told them not to tape over the suture line". No C/Os but anxious about her AVG and whether or not "They are going to to put that tube down my throat to look at my heart".  Emotional support to patient.   Objective Vitals:   11/29/15 1730 11/29/15 2050 11/30/15 0501 11/30/15 0923  BP: (!) 135/58 (!) 130/44 (!) 129/41 (!) 140/48  Pulse: 72 67 69 67  Resp: 20 20 19 18   Temp: 97.2 F (36.2 C) 99.8 F (37.7 C) 97.6 F (36.4 C) 97.9 F (36.6 C)  TempSrc: Oral Oral Oral Oral  SpO2: 97% 97% 97% 98%  Weight:  61.2 kg (134 lb 14.7 oz)    Height:       Physical Exam General: pleasant, slightly anxious, NAD Heart: S1, S2, + II/VI systolic M.  Lungs: BBS CTA A/P Abdomen: Soft Active BS Extremities: No LE edema Dialysis Access:  RFA AVG + bruit-bruised, no bleeding.   Dialysis Orders: [place] TTS 3.5 hours 400/Auto 1.5 EDW 60 kg 2.0 K/ 2.0 Ca  Linear Na UF profile 4 Heparin 4000 units IV Q tx Mircera 45mcg IV q 4 weeks (last dose 11/03/15) Sensipar 30 mg PO daily/Velphoro 500 mg PO TID-no VDRA.   Additional Objective Labs: Basic Metabolic Panel:  Recent Labs Lab 11/28/15 0419 11/29/15 0551 11/30/15 0455  NA 133* 136 135  K 5.6* 4.8 5.1  CL 96* 99* 98*  CO2 28 29 26   GLUCOSE 110* 94 86  BUN 38* 31* 45*  CREATININE 6.33* 5.68* 7.35*  CALCIUM 9.3 9.3 8.8*   Liver Function Tests:  Recent Labs Lab 11/28/15 0419 11/29/15 0551 11/30/15 0455  AST 32 33 28  ALT 28 31 29   ALKPHOS 78 72 74  BILITOT 0.6 0.6 0.6  PROT 5.7* 5.5* 5.8*  ALBUMIN 3.3* 2.9* 3.0*   No results for input(s): LIPASE, AMYLASE in the last 168 hours. CBC:  Recent Labs Lab 11/27/15 1555 11/27/15 2030 11/28/15 0006 11/28/15 0419 11/29/15 0551 11/30/15 0455  WBC 6.1 5.1 4.7 4.2 4.1 3.9*  NEUTROABS 5.4  --   --   --   --   --   HGB 12.8 10.9* 11.9* 10.7* 10.0* 10.2*  HCT 38.9 33.7* 37.3 33.9* 32.7* 31.9*  MCV  95.6 96.0 96.9 96.9 97.6 96.4  PLT 105* 99* 93* 88* 82* 85*   Blood Culture    Component Value Date/Time   SDES BLOOD LEFT FOREARM 11/27/2015 1647   SPECREQUEST BOTTLES DRAWN AEROBIC AND ANAEROBIC 5CC 11/27/2015 1647   CULT (A) 11/27/2015 1647    STAPHYLOCOCCUS AUREUS SUSCEPTIBILITIES PERFORMED ON PREVIOUS CULTURE WITHIN THE LAST 5 DAYS.    REPTSTATUS 11/30/2015 FINAL 11/27/2015 1647     Studies/Results: No results found. Medications: . sodium chloride 10 mL/hr at 11/28/15 0650   . atorvastatin  10 mg Oral QHS  . calcium acetate  2,001 mg Oral TID WC  .  ceFAZolin (ANCEF) IV  2 g Intravenous Q T,Th,Sat-1800  . cinacalcet  30 mg Oral Q breakfast  . [START ON 12/01/2015] darbepoetin (ARANESP) injection - DIALYSIS  60 mcg Intravenous Q Tue-HD  . multivitamin with minerals  1 tablet Oral Daily     Assessment/Plan: 1 MSSA bacteremia- on ancef-TEE is being deferred D/T age. Tmax 97.9 WBC 3.9.  2. AVG bleeding - secondary to degeneration- s/p revision 7/22- no further bleeding-edematous/echymotic + Bruit, no bleeding.  3 ESRD:normally TTS at [place]  via AVG-will need to use old limb of graft while new limb incorporates. Last HD Saturday. Possible DC today. If not, will have HD here tomorrow on schedule. K+ 5.1 today.  3 Hypertension:BP controlled- no BP meds- HD 01/30/16 pre wt 60.6 net uf 400 post wt 60.2-at OP EDW.  4. Anemia of ESRD:hgb >10- continue ESA now in form of darbe 5. Metabolic Bone Disease:continue home meds of velphoro 500 mg PO TID, sensipar 30 daily- No VDRA. Ca 8.8 6. Nutrition: 3.0 NPO at present. Renal diet when able to eat.  6. Dispo- we can continue to give ancef as an OP to treat her bacteremia- can treat for 4 weeks with surveillance cultures- not sure how plan would change significantly if we knew she had endocarditis- would not be a candidate for much at her age.  Renal would be OK with discharge home today    [place]inderne. Brown NP-C 11/30/2015, 10:07 AM   [facility] [phone]  Pt seen, examined, agree w assess/plan as above with additions as indicated. Stable, wants to go home.  REady for dc clinically.  Have d/w primary MD, plan is dc home today.  Usual OP HD tomorrow.  Kelly Splinter MD [employer] pager [phone]    cell [phone] 11/30/2015, 2:06 PM

## 2015-11-30 NOTE — Discharge Summary (Signed)
Physician Discharge Summary  [name] MRN: [id number] DOB/AGE: 08/08/1921 82 y.o.  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 Qty: 20 each, Refills: 0      CONTINUE these medications which have NOT CHANGED   Details  acetaminophen (TYLENOL) 500 MG tablet Take 1,000 mg by mouth every 6 (six) hours as needed (pain).     aspirin 81 MG tablet Take 81 mg by mouth daily.      atorvastatin (LIPITOR) 10 MG tablet Take 10 mg by mouth at bedtime.      calcium acetate (PHOSLO) 667 MG capsule Take 2,001 mg by mouth 3 (three) times daily with meals.    Multiple Vitamin (MULTIVITAMIN) tablet Take 1 tablet by mouth daily. Reported on 11/27/2015    SENSIPAR 30 MG tablet Take 30 mg by mouth daily.     traMADol-acetaminophen (ULTRACET) 37.5-325 MG per tablet Take 1 tablet by mouth 3 (three) times daily. Qty: 20 tablet, Refills: 0         Discharge Condition: Stable   Discharge Instructions Get Medicines reviewed and adjusted: Please take all your medications with you for your next visit with your Primary MD  Please request your Primary MD to go over all hospital tests  and procedure/radiological results at the follow up, please ask your Primary MD to get all Hospital records sent to his/her office.  If you experience worsening of your admission symptoms, develop shortness of breath, life threatening emergency, suicidal or homicidal thoughts you must seek medical attention immediately by calling 911 or calling your MD immediately if symptoms less severe.  You must read complete instructions/literature along with all the possible adverse reactions/side effects for all the Medicines you take and that have been prescribed to you. Take any new Medicines after you have completely understood and accpet all the possible adverse reactions/side effects.   Do not drive when taking Pain medications.   Do not take more than prescribed Pain, Sleep and Anxiety Medications  Special Instructions: If you have smoked or chewed Tobacco in the last 2 yrs please stop smoking, stop any regular Alcohol and or any Recreational drug use.  Wear Seat belts while driving.  Please note  You were cared for by a hospitalist during your hospital stay. Once you are discharged, your primary care physician will handle any further medical issues. Please note that NO REFILLS for any discharge medications will be authorized once you are discharged, as it is imperative that you return to your primary care physician (or establish a relationship with a primary care physician if you do not have one) for your aftercare needs so that they can reassess your need for medications  and monitor your lab values.  Discharge Instructions    Diet - low sodium heart healthy    Complete by:  As directed   Increase activity slowly    Complete by:  As directed       Allergies  Allergen Reactions  . Tape Other (See Comments)    Pulls off skin  . Sulfa Antibiotics Other (See Comments)    Bumps all over body      Disposition: 01-Home or Self Care   Consults: * ID Nephrology     Significant  Diagnostic Studies:  Dg Chest 2 View  Result Date: 11/27/2015 CLINICAL DATA:  Cough. EXAM: CHEST  2 VIEW COMPARISON:  None. FINDINGS: The heart, hila, and mediastinum are normal. No focal infiltrates or overt edema. No acute abnormalities identified. IMPRESSION: No active cardiopulmonary disease. Electronically Signed   By: Dorise Bullion III M.D   On: 11/27/2015 17:54   2-D echo LV EF: 60% -   65%  ------------------------------------------------------------------- Indications:      Bacteremia 790.7.  ------------------------------------------------------------------- History:   PMH:  AVG bleeding. ESRD. Hypertension. Anemia of ESRD. Metabolic bone disease.  ------------------------------------------------------------------- Study Conclusions  - Left ventricle: The cavity size was normal. There was mild focal   basal hypertrophy of the septum. Systolic function was normal.   The estimated ejection fraction was in the range of 60% to 65%.   Wall motion was normal; there were no regional wall motion   abnormalities. Features are consistent with a pseudonormal left   ventricular filling pattern, with concomitant abnormal relaxation   and increased filling pressure (grade 2 diastolic dysfunction). - Aortic valve: Trileaflet; moderately thickened, moderately   calcified leaflets. There was severe stenosis. There was mild   regurgitation. Mean gradient (S): 35 mm Hg. Peak gradient (S): 67   mm Hg. VTI ratio of LVOT to aortic valve: 0.31. Valve area (VTI):   1.06 cm^2. Valve area (Vmax): 1.01 cm^2. Valve area (Vmean): 0.99   cm^2. - Mitral valve: Calcified annulus. Mildly calcified leaflets .   There was trivial regurgitation. - Left atrium: The atrium was mildly to moderately dilated. - Right atrium: Central venous pressure (est): 8 mm Hg. - Atrial septum: No defect or patent foramen ovale was identified. - Tricuspid valve: There was mild regurgitation. - Pulmonary arteries:  Systolic pressure was severely increased. PA   peak pressure: 62 mm Hg (S). - Pericardium, extracardiac: A trivial pericardial effusion was   identified posterior to the heart.  Impressions:  - Mild basal septal LV hypertrophy with LVEF 60-65%. Grade 2   diastolic dysfunction with increased LV filling pressure. Mild to   moderate left atrial enlargement. MAC with mildly calcified   mitral leaflets and trivial mitral regurgitation. Severe calcific   aortic stenosis as outlined above with mild aortic regurgitation.   Mild tricuspid regurgitation with evidence of severe pulmonary   hypertension, PA S/P estimated 62 mmHg. Trivial posterior   pericardial effusion. No definitive valvular vegetations noted.   Filed Weights   11/28/15 1700 11/28/15 2042 11/29/15 2050  Weight: 60.2 kg (132 lb 11.5 oz) 62.4 kg (137 lb 9.1 oz) 61.2 kg (134 lb 14.7 oz)     Microbiology: Recent Results (from the past 240 hour(s))  Blood Culture (routine x 2)     Status: Abnormal   Collection Time: 11/27/15  4:40 PM  Result Value Ref Range Status   Specimen Description BLOOD LEFT HAND  Final   Special Requests BOTTLES DRAWN AEROBIC AND ANAEROBIC 5CC  Final   Culture  Setup Time   Final    GRAM POSITIVE COCCI IN CLUSTERS IN BOTH AEROBIC AND ANAEROBIC BOTTLES Organism ID to follow CRITICAL RESULT CALLED TO, READ BACK BY AND VERIFIED WITH: T. STONE, PHARM AT 0902 ON [id number] BY S. YARBROUGH    Culture STAPHYLOCOCCUS AUREUS (A)  Final   Report Status 11/30/2015 FINAL  Final   Organism ID, Bacteria STAPHYLOCOCCUS AUREUS  Final      Susceptibility   Staphylococcus aureus - MIC*    CIPROFLOXACIN <=0.5 SENSITIVE Sensitive     ERYTHROMYCIN <=0.25 SENSITIVE Sensitive     GENTAMICIN <=0.5 SENSITIVE Sensitive     OXACILLIN <=0.25 SENSITIVE Sensitive     TETRACYCLINE <=1 SENSITIVE Sensitive     VANCOMYCIN <=0.5 SENSITIVE Sensitive     TRIMETH/SULFA <=10 SENSITIVE Sensitive     CLINDAMYCIN <=0.25 SENSITIVE  Sensitive     RIFAMPIN <=0.5 SENSITIVE Sensitive     Inducible Clindamycin NEGATIVE Sensitive     * STAPHYLOCOCCUS AUREUS  Blood Culture ID Panel (Reflexed)     Status: Abnormal   Collection Time: 11/27/15  4:40 PM  Result Value Ref Range Status   Enterococcus species NOT DETECTED NOT DETECTED Final   Vancomycin resistance NOT DETECTED NOT DETECTED Final   Listeria monocytogenes NOT DETECTED NOT DETECTED Final   Staphylococcus species DETECTED (A) NOT DETECTED Final    Comment: CRITICAL RESULT CALLED TO, READ BACK BY AND VERIFIED WITH: T. STONE, PHARM AT 0902 ON [id number] BY S. YARBROUGH    Staphylococcus aureus DETECTED (A) NOT DETECTED Final    Comment: CRITICAL RESULT CALLED TO, READ BACK BY AND VERIFIED WITH: T. STONE, PHARM AT 0086 ON [id number] BY S. YARBROUGH    Methicillin resistance NOT DETECTED NOT DETECTED Final   Streptococcus species NOT DETECTED NOT DETECTED Final   Streptococcus agalactiae NOT DETECTED NOT DETECTED Final   Streptococcus pneumoniae NOT DETECTED NOT DETECTED Final   Streptococcus pyogenes NOT DETECTED NOT DETECTED Final   Acinetobacter baumannii NOT DETECTED NOT DETECTED Final   Enterobacteriaceae species NOT DETECTED NOT DETECTED Final   Enterobacter cloacae complex NOT DETECTED NOT DETECTED Final   Escherichia coli NOT DETECTED NOT DETECTED Final   Klebsiella oxytoca NOT DETECTED NOT DETECTED Final   Klebsiella pneumoniae NOT DETECTED NOT DETECTED Final   Proteus species NOT DETECTED NOT DETECTED Final   Serratia marcescens NOT DETECTED NOT DETECTED Final   Carbapenem resistance NOT DETECTED NOT DETECTED Final   Haemophilus influenzae NOT DETECTED NOT DETECTED Final   Neisseria meningitidis NOT DETECTED NOT DETECTED Final   Pseudomonas aeruginosa NOT DETECTED NOT DETECTED Final   Candida albicans NOT DETECTED NOT DETECTED Final   Candida glabrata NOT DETECTED NOT DETECTED Final   Candida krusei NOT DETECTED NOT DETECTED Final   Candida parapsilosis NOT  DETECTED NOT DETECTED Final   Candida tropicalis NOT DETECTED NOT DETECTED Final  Blood Culture (routine x 2)     Status: Abnormal   Collection Time: 11/27/15  4:47 PM  Result Value Ref Range Status   Specimen Description BLOOD LEFT FOREARM  Final   Special Requests BOTTLES DRAWN AEROBIC AND ANAEROBIC 5CC  Final   Culture  Setup Time   Final    GRAM POSITIVE COCCI IN CLUSTERS IN BOTH AEROBIC AND ANAEROBIC BOTTLES CRITICAL RESULT CALLED TO, READ BACK BY AND VERIFIED WITH: T. STONE, PHARM AT [phone] ON [id number] BY Rhea Bleacher    Culture (A)  Final    STAPHYLOCOCCUS AUREUS SUSCEPTIBILITIES PERFORMED ON PREVIOUS  CULTURE WITHIN THE LAST 5 DAYS.    Report Status 11/30/2015 FINAL  Final  MRSA PCR Screening     Status: None   Collection Time: 11/27/15  8:47 PM  Result Value Ref Range Status   MRSA by PCR NEGATIVE NEGATIVE Final    Comment:        The GeneXpert MRSA Assay (FDA approved for NASAL specimens only), is one component of a comprehensive MRSA colonization surveillance program. It is not intended to diagnose MRSA infection nor to guide or monitor treatment for MRSA infections.        Blood Culture    Component Value Date/Time   SDES BLOOD LEFT FOREARM 11/27/2015 1647   SPECREQUEST BOTTLES DRAWN AEROBIC AND ANAEROBIC 5CC 11/27/2015 1647   CULT (A) 11/27/2015 1647    STAPHYLOCOCCUS AUREUS SUSCEPTIBILITIES PERFORMED ON PREVIOUS CULTURE WITHIN THE LAST 5 DAYS.    REPTSTATUS 11/30/2015 FINAL 11/27/2015 1647      Labs: Results for orders placed or performed during the hospital encounter of 11/27/15 (from the past 48 hour(s))  Glucose, capillary     Status: Abnormal   Collection Time: 11/28/15 11:44 AM  Result Value Ref Range   Glucose-Capillary 144 (H) 65 - 99 mg/dL  Glucose, capillary     Status: Abnormal   Collection Time: 11/29/15 12:12 AM  Result Value Ref Range   Glucose-Capillary 105 (H) 65 - 99 mg/dL  CBC     Status: Abnormal   Collection Time: 11/29/15  5:51  AM  Result Value Ref Range   WBC 4.1 4.0 - 10.5 K/uL   RBC 3.35 (L) 3.87 - 5.11 MIL/uL   Hemoglobin 10.0 (L) 12.0 - 15.0 g/dL   HCT 32.7 (L) 36.0 - 46.0 %   MCV 97.6 78.0 - 100.0 fL   MCH 29.9 26.0 - 34.0 pg   MCHC 30.6 30.0 - 36.0 g/dL   RDW 15.3 11.5 - 15.5 %   Platelets 82 (L) 150 - 400 K/uL    Comment: CONSISTENT WITH PREVIOUS RESULT  Comprehensive metabolic panel     Status: Abnormal   Collection Time: 11/29/15  5:51 AM  Result Value Ref Range   Sodium 136 135 - 145 mmol/L   Potassium 4.8 3.5 - 5.1 mmol/L   Chloride 99 (L) 101 - 111 mmol/L   CO2 29 22 - 32 mmol/L   Glucose, Bld 94 65 - 99 mg/dL   BUN 31 (H) 6 - 20 mg/dL   Creatinine, Ser 5.68 (H) 0.44 - 1.00 mg/dL   Calcium 9.3 8.9 - 10.3 mg/dL   Total Protein 5.5 (L) 6.5 - 8.1 g/dL   Albumin 2.9 (L) 3.5 - 5.0 g/dL   AST 33 15 - 41 U/L   ALT 31 14 - 54 U/L   Alkaline Phosphatase 72 38 - 126 U/L   Total Bilirubin 0.6 0.3 - 1.2 mg/dL   GFR calc non Af Amer 6 (L) >60 mL/min   GFR calc Af Amer 7 (L) >60 mL/min    Comment: (NOTE) The eGFR has been calculated using the CKD EPI equation. This calculation has not been validated in all clinical situations. eGFR's persistently <60 mL/min signify possible Chronic Kidney Disease.    Anion gap 8 5 - 15  Glucose, capillary     Status: None   Collection Time: 11/29/15  5:57 AM  Result Value Ref Range   Glucose-Capillary 93 65 - 99 mg/dL  Glucose, capillary     Status: None   Collection Time: 11/29/15  7:51 AM  Result Value Ref Range   Glucose-Capillary 96 65 - 99 mg/dL  Glucose, capillary     Status: None   Collection Time: 11/29/15 12:07 PM  Result Value Ref Range   Glucose-Capillary 90 65 - 99 mg/dL  Glucose, capillary     Status: Abnormal   Collection Time: 11/29/15  5:11 PM  Result Value Ref Range   Glucose-Capillary 127 (H) 65 - 99 mg/dL  Comprehensive metabolic panel     Status: Abnormal   Collection Time: 11/30/15  4:55 AM  Result Value Ref Range   Sodium 135 135  - 145 mmol/L   Potassium 5.1 3.5 - 5.1 mmol/L   Chloride 98 (L) 101 - 111 mmol/L   CO2 26 22 - 32 mmol/L   Glucose, Bld 86 65 - 99 mg/dL   BUN 45 (H) 6 - 20 mg/dL   Creatinine, Ser 7.35 (H) 0.44 - 1.00 mg/dL   Calcium 8.8 (L) 8.9 - 10.3 mg/dL   Total Protein 5.8 (L) 6.5 - 8.1 g/dL   Albumin 3.0 (L) 3.5 - 5.0 g/dL   AST 28 15 - 41 U/L   ALT 29 14 - 54 U/L   Alkaline Phosphatase 74 38 - 126 U/L   Total Bilirubin 0.6 0.3 - 1.2 mg/dL   GFR calc non Af Amer 4 (L) >60 mL/min   GFR calc Af Amer 5 (L) >60 mL/min    Comment: (NOTE) The eGFR has been calculated using the CKD EPI equation. This calculation has not been validated in all clinical situations. eGFR's persistently <60 mL/min signify possible Chronic Kidney Disease.    Anion gap 11 5 - 15  CBC     Status: Abnormal   Collection Time: 11/30/15  4:55 AM  Result Value Ref Range   WBC 3.9 (L) 4.0 - 10.5 K/uL   RBC 3.31 (L) 3.87 - 5.11 MIL/uL   Hemoglobin 10.2 (L) 12.0 - 15.0 g/dL   HCT 31.9 (L) 36.0 - 46.0 %   MCV 96.4 78.0 - 100.0 fL   MCH 30.8 26.0 - 34.0 pg   MCHC 32.0 30.0 - 36.0 g/dL   RDW 15.2 11.5 - 15.5 %   Platelets 85 (L) 150 - 400 K/uL    Comment: CONSISTENT WITH PREVIOUS RESULT     Lipid Panel  No results found for: CHOL, TRIG, HDL, CHOLHDL, VLDL, LDLCALC, LDLDIRECT   No results found for: HGBA1C   Lab Results  Component Value Date   CREATININE 7.35 (H) 11/30/2015       82 y.o. female with ESRD on hemodialysis on Tuesday Thursday and Saturday started noticing bleeding from the right AV graft since this morning. Patient experienced at least 3 episodes of bleeding. In the ER patient was found to be mildly febrile. Patient has chronic cough. Patient had another episode of bleeding from the AV graft for which patient had dressing placed and presently bleeding is contained.  Vascular surgery was consulted  Hospital course Right AV graft bleeding - hold aspirin and closely follow CBC. Vascular surgery  consulted . Patient underwent Revision of right forearm analysis graft with interposition 6 mm Gore-Tex. Resection of degenerated dialysis graft 7/22 ,. No further bleeding   MSSA bacteremia - chest x-ray negative, could be from the bleeding graft. Follow blood cultures. Patient started on on empiric antibiotics   ancef- ID recommended TTE, given calcification of the aortic valve cannot fully rule out endocarditis, this was not definitively demonstrated, but cannot be excluded as  there is severe calcific aortic stenosis (apparently progressive since she was seen by Dr. Percival Spanish in 2012). She does not sound like an ideal TEE candidate - and TEE may not be able to fully exclude endocarditis. Would consider treatment for 6 week course presuming infection of the valve. Could see Dr. Percival Spanish back in follow-up in the office to discuss severe AS, however, options are probably limited for surgery-  ESRD on hemodialysis - Tuesday Thursday-Saturday. Consult nephrology for dialysis.  Hyperlipidemia on statins.  Thrombocytopenia - platelets in the range of 78-99, stable    Discharge Exam: *  Blood pressure (!) 140/48, pulse 67, temperature 97.9 F (36.6 C), temperature source Oral, resp. rate 18, height 5' 5"  (1.651 m), weight 61.2 kg (134 lb 14.7 oz), SpO2 98 %. General: pleasant, slightly anxious, NAD Heart: S1, S2, + II/VI systolic M.  Lungs: BBS CTA A/P Abdomen: Soft Active BS Extremities: No LE edema Dialysis Access:  RFA AVG + bruit-bruised, no bleeding.       Follow-up Information    GREEN, Keenan Bachelor, MD. Schedule an appointment as soon as possible for a visit today.   Specialty:  Internal Medicine Why:  Hospital follow-up Contact information: [place], [place] [address] [phone]        Minus Breeding, MD. Call today.   Specialty:  Cardiology Why:  Make appointment to discuss further treatment options about aortic stenosis Contact information: [phone] [place] [address] [phone]           Signed: Reyne Dumas 11/30/2015, 10:52 AM        Time spent >45 mins

## 2015-12-01 SURGERY — ECHOCARDIOGRAM, TRANSESOPHAGEAL
Anesthesia: Moderate Sedation

## 2015-12-04 LAB — CULTURE, BLOOD (ROUTINE X 2): Culture: NO GROWTH

## 2015-12-07 ENCOUNTER — Encounter (HOSPITAL_COMMUNITY): Payer: Self-pay | Admitting: Neurology

## 2015-12-07 ENCOUNTER — Emergency Department (HOSPITAL_COMMUNITY)
Admission: EM | Admit: 2015-12-07 | Discharge: 2015-12-07 | Disposition: A | Discharge status: Home / Self Care | Payer: Medicare Other | Attending: Emergency Medicine | Admitting: Emergency Medicine

## 2015-12-07 DIAGNOSIS — T82838A Hemorrhage of vascular prosthetic devices, implants and grafts, initial encounter: Secondary | ICD-10-CM | POA: Diagnosis not present

## 2015-12-07 DIAGNOSIS — Y828 Other medical devices associated with adverse incidents: Secondary | ICD-10-CM | POA: Insufficient documentation

## 2015-12-07 DIAGNOSIS — Z8673 Personal history of transient ischemic attack (TIA), and cerebral infarction without residual deficits: Secondary | ICD-10-CM | POA: Diagnosis not present

## 2015-12-07 DIAGNOSIS — Z7982 Long term (current) use of aspirin: Secondary | ICD-10-CM | POA: Diagnosis not present

## 2015-12-07 DIAGNOSIS — T148XXA Other injury of unspecified body region, initial encounter: Secondary | ICD-10-CM

## 2015-12-07 DIAGNOSIS — I120 Hypertensive chronic kidney disease with stage 5 chronic kidney disease or end stage renal disease: Secondary | ICD-10-CM | POA: Diagnosis not present

## 2015-12-07 DIAGNOSIS — N186 End stage renal disease: Secondary | ICD-10-CM | POA: Insufficient documentation

## 2015-12-07 NOTE — Progress Notes (Addendum)
[facility]   Subjective  - Patient reported to the ED with a CC: left forearm bloody drainage at distal incision site.  S/P revision venous half AV graft 11/28/2015 by Dr. Trula Slade.   Objective 160/70 (!) 57 98.1 F (36.7 C) (Oral) 18 98% No intake or output data in the 24 hours ending 12/07/15 1104  No hematoma, no active drainage or bleeding from incision. No bleeding with active bicep curl or grip pumps Palpable thrill, palpable radial pulse  Assessment/Planning: S/P revision left forearm AV graft 11/28/2015 Cleaned incisions and added dermabond over distal incision  She will f/u in our office with Dr. Trula Slade in 1 week  Theda Sers Ackerly 12/07/2015 11:04 AM --  Laboratory Lab Results: No results for input(s): WBC, HGB, HCT, PLT in the last 72 hours. BMET No results for input(s): NA, K, CL, CO2, GLUCOSE, BUN, CREATININE, CALCIUM in the last 72 hours.  COAG Lab Results  Component Value Date   INR 1.16 11/28/2015   No results found for: PTT   Addendum  I agree with the physician assistant's findings.  Post-op bleeding related recent revision of the AVG.  Follow up with Dr. Trula Slade in the office next week.  Adele Barthel, MD [facility] [place] Office: [phone] Pager: [phone]  12/07/2015, 11:32 AM

## 2015-12-07 NOTE — Discharge Instructions (Signed)
Follow-up with the vascular surgeon as instructed,

## 2015-12-07 NOTE — ED Provider Notes (Signed)
[facility] DEPT Provider Note   CSN: [id number] Arrival date & time: 12/07/15  6144  First Provider Contact:  First MD Initiated Contact with Patient 12/07/15 1014        History   Chief Complaint Chief Complaint  Patient presents with  . Vascular Access Problem    HPI [name] is a 82 y.o. female.  HPI Patient presents to the emergency room for evaluation of bleeding at her graft site.  The patient was recently admitted to the hospital and discharged on July 24 for hemorrhage from her arteriovenous graft site.  She was noted to have an ulceration on the skin above the graft.Dr. Trula Slade performed a surgical procedure on July 21. He did revision of her right forearm graft with interposition of cortex as well as resection of the degenerated dialysis graft.  Patient states over this past weekend she said some continuous oozing from the suture line of her surgical wound. She denies any pulsatile bleeding. She went to her doctor's office who apparently tried to reach the vascular surgeon and was instructed to come to the emergency room. Denies any fevers or chills. No numbness or weakness. Past Medical History:  Diagnosis Date  . Anemia   . Benign bladder mass   . Chronic cough   . DJD (degenerative joint disease)   . Dyslipidemia   . ESRD on hemodialysis ([place])   . Gout   . Heart murmur   . HTN (hypertension)   . Mild aortic stenosis   . Obstructive sleep apnea    mild  . Orthostatic syncope    around year 2006  . Pneumonia   . TIA (transient ischemic attack)     Patient Active Problem List   Diagnosis Date Noted  . Staphylococcus aureus bacteremia   . 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 Bleeding from dialysis shunt ([place])   . Nausea   . Fever in adult 11/27/2015  . Hemorrhage of arteriovenous graft ([place]) 11/27/2015  . HLD (hyperlipidemia) 11/27/2015  . Bleeding 11/27/2015  . Influenza A with respiratory  manifestations 05/08/2013  . Cough 05/08/2013  . Chronic cough 02/15/2013  . Secondary hyperparathyroidism ([place]) 11/21/2012  . ESRD (end stage renal disease) ([place]) 11/20/2012    Past Surgical History:  Procedure Laterality Date  . BONE MARROW BIOPSY    . CATARACT EXTRACTION W/ INTRAOCULAR LENS  IMPLANT, BILATERAL    . COLONOSCOPY W/ BIOPSIES AND POLYPECTOMY    . left arm dialysis graft    . NEPHRECTOMY     left  . REVISION OF ARTERIOVENOUS GORETEX GRAFT Right 11/24/2014   Procedure: REPLACEMENT OF  MEDIAL SIDE OF RIGHT FORARM ARTERIOVENOUS GORETEX GRAFT;  Surgeon: Conrad Marietta, MD;  Location: [facility];  Service: Vascular;  Laterality: Right;  . REVISION OF ARTERIOVENOUS GORETEX GRAFT Right 11/28/2015   Procedure: REVISION OF RIGHT ARM ARTERIOVENOUS GORETEX GRAFT;  Surgeon: Serafina Mitchell, MD;  Location: [facility];  Service: Vascular;  Laterality: Right;  . TOTAL ABDOMINAL HYSTERECTOMY      OB History    No data available       Home Medications    Prior to Admission medications   Medication Sig Start Date End Date Taking? Authorizing Provider  acetaminophen (TYLENOL) 500 MG tablet Take 1,000 mg by mouth every 6 (six) hours as needed (pain).    Yes Historical Provider, MD  aspirin 81 MG tablet Take 81 mg by mouth daily.     Yes Historical  Provider, MD  atorvastatin (LIPITOR) 10 MG tablet Take 10 mg by mouth at bedtime.     Yes Historical Provider, MD  calcium acetate (PHOSLO) 667 MG capsule Take 2,001 mg by mouth 3 (three) times daily with meals. Pt only takes twice daily   Yes Historical Provider, MD  ceFAZolin (ANCEF) 2-4 GM/100ML-% IVPB Inject 100 mLs (2 g total) into the vein every Tuesday, Thursday, and Saturday at 6 PM. 12/01/15 01/11/16 Yes Reyne Dumas, MD  SENSIPAR 30 MG tablet Take 30 mg by mouth daily.  02/14/13  Yes Historical Provider, MD  traMADol-acetaminophen (ULTRACET) 37.5-325 MG per tablet Take 1 tablet by mouth 3 (three) times daily. 11/24/14  Yes Alvia Grove, PA-C    Multiple Vitamin (MULTIVITAMIN) tablet Take 1 tablet by mouth daily. Reported on 11/27/2015    Historical Provider, MD    Family History Family History  Problem Relation Age of Onset  . Hypertension Mother   . Diabetes Mother   . Hypertension Other     Social History Social History  Substance Use Topics  . Smoking status: Never Smoker  . Smokeless tobacco: Never Used  . Alcohol use No     Allergies   Tape and Sulfa antibiotics   Review of Systems Review of Systems  Constitutional: Negative for fever.  Respiratory: Negative for shortness of breath.   Cardiovascular: Negative for chest pain.  Neurological: Negative for weakness.  All other systems reviewed and are negative.    Physical Exam Updated Vital Signs BP 160/70 (BP Location: Left Arm)   Pulse (!) 57   Temp 98.1 F (36.7 C) (Oral)   Resp 18   SpO2 98%   Physical Exam  Constitutional: She appears well-developed and well-nourished. No distress.  HENT:  Head: Normocephalic and atraumatic.  Right Ear: External ear normal.  Left Ear: External ear normal.  Eyes: Conjunctivae are normal. Right eye exhibits no discharge. Left eye exhibits no discharge. No scleral icterus.  Neck: Neck supple. No tracheal deviation present.  Cardiovascular: Normal rate.   Pulmonary/Chest: Effort normal. No stridor. No respiratory distress.  Musculoskeletal: She exhibits no edema.  Dressing was removed from the surgical site over her arteriovenous graft in the right upper extremity, there is a small less than 1 cm area of clot without any evidence of bleeding,   Neurological: She is alert. Cranial nerve deficit: no gross deficits.  Skin: Skin is warm and dry. No rash noted.  Psychiatric: She has a normal mood and affect.  Nursing note and vitals reviewed.    ED Treatments / Results  Labs (all labs ordered are listed, but only abnormal results are displayed) Labs Reviewed - No data to display  EKG  EKG  Interpretation None       Radiology No results found.  Procedures Procedures (including critical care time)  Medications Ordered in ED Medications - No data to display   Initial Impression / Assessment and Plan / ED Course  I have reviewed the triage vital signs and the nursing notes.  Pertinent labs & imaging results that were available during my care of the patient were reviewed by me and considered in my medical decision making (see chart for details).  Clinical Course   Patient does not have any evidence of any serious bleeding.  I spoke with vascular surgery on-call.patient since she was sent to the ED to see them.  Butler Denmark evaluated the patient in the emergency room. She applied some Dermabond to the wound. Patient is  safe to follow up in the vascular clinic.  Final Clinical Impressions(s) / ED Diagnoses   Final diagnoses:  Bleeding from wound [facility])    New Prescriptions New Prescriptions   No medications on file     Dorie Rank, MD 12/07/15 1139

## 2015-12-07 NOTE — ED Notes (Signed)
Dermabond retrieved for vascualr PA for patient.

## 2015-12-07 NOTE — ED Notes (Signed)
Pt refused gown and getting into bed

## 2015-12-07 NOTE — ED Triage Notes (Signed)
Pt reports oozing bleeding from right dialysis site since yesterday. Last dialysis was Saturday, denies injury to site. Small amount of oozing blood to area. Gauze applied.

## 2015-12-14 ENCOUNTER — Telehealth: Payer: Self-pay | Admitting: Surgery

## 2015-12-14 ENCOUNTER — Encounter: Payer: Self-pay | Admitting: Surgery

## 2015-12-14 ENCOUNTER — Ambulatory Visit (INDEPENDENT_AMBULATORY_CARE_PROVIDER_SITE_OTHER): Payer: Medicare Other | Admitting: Surgery

## 2015-12-14 VITALS — BP 171/62 | HR 75 | Temp 98.0°F | Resp 16 | Ht 65.0 in | Wt 133.0 lb

## 2015-12-14 DIAGNOSIS — N186 End stage renal disease: Secondary | ICD-10-CM

## 2015-12-14 NOTE — Telephone Encounter (Signed)
Sched appt 12/14/15 at 10:30. Spoke to pt to inform them of appt.

## 2015-12-14 NOTE — Telephone Encounter (Signed)
-----   Message from Mena Goes, RN sent at 12/07/2015 11:48 AM EDT ----- Regarding: asap appt   ----- Message ----- From: Ulyses Amor, PA-C Sent: 12/07/2015  11:08 AM To: Vvs Charge Pool  F/U with Dr. Trula Slade in 1 weeks s/p AV graft revision she was seen in the ED secondary to "bloody drainage at the distal incision site.  No active bleeding.   The patient wants you to call her home phone for the appoint time [phone].  thx

## 2015-12-14 NOTE — Progress Notes (Signed)
   Patient name: [name] MRN: [id number] DOB: 12-Nov-1921 Sex: female  REASON FOR VISIT: Post-op  HPI: [name] is a 82 y.o. female who returns today for follow-up.  On 11/27/2015, she presented to the emergency department with several episodes of bleeding from her forearm graft.  On examination she had a ulcer/degeneration of the skin above the graft on the lateral side.  She was taken to the operating room and underwent revision of her right forearm graft with interposition 6 mm Gore-Tex graft.  I also resected the degenerated section.  She was seen in the emergency Department a few days later from drainage from her apex incision.  She has not had any further drainage since that time.  She denies fevers or chills.  Current Outpatient Prescriptions  Medication Sig Dispense Refill  . acetaminophen (TYLENOL) 500 MG tablet Take 1,000 mg by mouth every 6 (six) hours as needed (pain).     Marland Kitchen aspirin 81 MG tablet Take 81 mg by mouth daily.      Marland Kitchen atorvastatin (LIPITOR) 10 MG tablet Take 10 mg by mouth at bedtime.      . calcium acetate (PHOSLO) 667 MG capsule Take 2,001 mg by mouth 3 (three) times daily with meals. Pt only takes twice daily    . ceFAZolin (ANCEF) 2-4 GM/100ML-% IVPB Inject 100 mLs (2 g total) into the vein every Tuesday, Thursday, and Saturday at 6 PM. 20 each 0  . Multiple Vitamin (MULTIVITAMIN) tablet Take 1 tablet by mouth daily. Reported on 11/27/2015    . SENSIPAR 30 MG tablet Take 30 mg by mouth daily.     . traMADol-acetaminophen (ULTRACET) 37.5-325 MG per tablet Take 1 tablet by mouth 3 (three) times daily. 20 tablet 0   No current facility-administered medications for this visit.     REVIEW OF SYSTEMS:  [X]  denotes positive finding, [ ]  denotes negative finding Cardiac  Comments:  Chest pain or chest pressure:    Shortness of breath upon exertion:    Short of breath when lying flat:    Irregular heart rhythm:      Constitutional    Fever or chills:      PHYSICAL EXAM: Vitals:   12/14/15 1049 12/14/15 1050  BP: (!) 171/65 (!) 171/62  Pulse: 75   Resp: 16   Temp: 98 F (36.7 C)   TempSrc: Oral   SpO2: 97%   Weight: 133 lb (60.3 kg)   Height: 5\' 5"  (1.651 m)     GENERAL: The patient is a well-nourished female, in no acute distress. The vital signs are documented above. CARDIOVASCULAR: There is a regular rate and rhythm. PULMONARY: There is good air exchange bilaterally without wheezing or rales. Her incisions are healing nicely.  There is a good thrill within the graft.  There is no evidence of infection.  MEDICAL ISSUES: The patient is recovering nicely from her revision of her right forearm graft which was done secondary to bleeding.  This graft can be used on the lateral side and approximately 2 weeks  Annamarie Major, MD [facility] [phone] Pager [phone]

## 2015-12-31 ENCOUNTER — Encounter: Payer: Self-pay | Admitting: Cardiology

## 2016-01-12 ENCOUNTER — Encounter: Payer: Self-pay | Admitting: Cardiology

## 2016-01-12 NOTE — Progress Notes (Signed)
Cardiology Office Note   Date:  01/14/2016   ID:  REGIS WILAND, DOB December 30, 1921, MRN [id number]  PCP:  Criselda Peaches, MD  Cardiologist:   Minus Breeding, MD   Chief Complaint  Patient presents with  . Aortic Stenosis     History of Present Illness: Kennadee KLYN KROENING is a 82 y.o. female who presents for evaluation of aortic stenosis.  I last saw her in 2008.   She was admitted with fever in July and was gong to have a TEE but was not felt to be an ideal candidate.  A TTE demonstrated severe AS that had progressed since a previous echo.  Recent echo had a mean gradient of 35 with increased pulmonary pressure peak of 62.  (I reviewed hospital records.)    She reports that she feels quite well. She lives alone. She climbs 5 stairs routinely. She denies any cardiovascular symptoms.  The patient denies any new symptoms such as chest discomfort, neck or arm discomfort. There has been no new shortness of breath, PND or orthopnea. There have been no reported palpitations, presyncope or syncope.  Past Medical History:  Diagnosis Date  . Anemia   . Aortic stenosis   . Benign bladder mass   . Chronic cough   . DJD (degenerative joint disease)   . Dyslipidemia   . ESRD on hemodialysis ([place])   . Gout   . HTN (hypertension)   . Obstructive sleep apnea    mild  . Orthostatic syncope    around year 2006  . Pneumonia   . TIA (transient ischemic attack)     Past Surgical History:  Procedure Laterality Date  . BONE MARROW BIOPSY    . CATARACT EXTRACTION W/ INTRAOCULAR LENS  IMPLANT, BILATERAL    . COLONOSCOPY W/ BIOPSIES AND POLYPECTOMY    . left arm dialysis graft    . NEPHRECTOMY     left  . REVISION OF ARTERIOVENOUS GORETEX GRAFT Right 11/24/2014   Procedure: REPLACEMENT OF  MEDIAL SIDE OF RIGHT FORARM ARTERIOVENOUS GORETEX GRAFT;  Surgeon: Conrad Fruitridge Pocket, MD;  Location: [facility];  Service: Vascular;  Laterality: Right;  . REVISION OF ARTERIOVENOUS GORETEX GRAFT Right 11/28/2015   Procedure: REVISION OF RIGHT ARM ARTERIOVENOUS GORETEX GRAFT;  Surgeon: Serafina Mitchell, MD;  Location: [facility];  Service: Vascular;  Laterality: Right;  . TOTAL ABDOMINAL HYSTERECTOMY       Current Outpatient Prescriptions  Medication Sig Dispense Refill  . acetaminophen (TYLENOL) 500 MG tablet Take 1,000 mg by mouth every 6 (six) hours as needed (pain).     Marland Kitchen aspirin 81 MG tablet Take 81 mg by mouth daily.      Marland Kitchen atorvastatin (LIPITOR) 10 MG tablet Take 10 mg by mouth at bedtime.      . calcium acetate (PHOSLO) 667 MG capsule Take 2,001 mg by mouth 3 (three) times daily with meals. Pt only takes twice daily    . Multiple Vitamin (MULTIVITAMIN) tablet Take 1 tablet by mouth daily. Reported on 11/27/2015    . SENSIPAR 30 MG tablet Take 30 mg by mouth daily.     . traMADol-acetaminophen (ULTRACET) 37.5-325 MG per tablet Take 1 tablet by mouth 3 (three) times daily. 20 tablet 0   No current facility-administered medications for this visit.     Allergies:   Tape and Sulfa antibiotics    Social History:  The patient  reports that she has never smoked. She has never used smokeless tobacco.  She reports that she does not drink alcohol or use drugs.   Family History:  The patient's family history includes Diabetes in her mother; Hypertension in her mother and other.    ROS:  Please see the history of present illness.   Otherwise, review of systems are positive for none.   All other systems are reviewed and negative.    PHYSICAL EXAM: VS:  BP (!) 150/74   Pulse 64   Ht _0  (1.651 m)   Wt 131 lb (59.4 kg)   SpO2 97%   BMI 21.80 kg/m  , BMI Body mass index is 21.8 kg/m. GENERAL:  Well appearing HEENT:  Pupils equal round and reactive, fundi not visualized, oral mucosa unremarkable NECK:  No jugular venous distention, waveform within normal limits, carotid upstroke brisk and symmetric, no bruits, no thyromegaly LYMPHATICS:  No cervical, inguinal adenopathy LUNGS:  Clear to auscultation  bilaterally BACK:  No CVA tenderness, Lordosis  CHEST:  Unremarkable HEART:  PMI not displaced or sustained,S1 and S2 within normal limits, no S3, no S4, no clicks, no rubs, 3 out of 6 apical mid peaking systolic murmur radiating out the aortic outflow tract, no diastolicurs ABD:  Flat, positive bowel sounds normal in frequency in pitch, no bruits, no rebound, no guarding, no midline pulsatile mass, no hepatomegaly, no splenomegaly EXT:  2 plus pulses throughout, no edema, no cyanosis no clubbing, pulsatile thrill and bruit over the right forearm fistula  SKIN:  No rashes no nodules NEURO:  Cranial nerves II through XII grossly intact, motor grossly intact throughout PSYCH:  Cognitively intact, oriented to person place and time    EKG:  EKG is not ordered today. The ekg ordered r07/22/2017 demonstrates sinus rhythm, rate 63, right bundle branch block, no acute ST-T wave changes.   Recent Labs: 11/30/2015: ALT 29; BUN 45; Creatinine, Ser 7.35; Hemoglobin 10.2; Platelets 85; Potassium 5.1; Sodium 135    Lipid Panel No results found for: CHOL, TRIG, HDL, CHOLHDL, VLDL, LDLCALC, LDLDIRECT    Wt Readings from Last 3 Encounters:  01/14/16 131 lb (59.4 kg)  12/14/15 133 lb (60.3 kg)  11/29/15 134 lb 14.7 oz (61.2 kg)      Other studies Reviewed: Additional studies/ records that were reviewed today include: Hospital records. Review of the above records demonstrates:  Please see elsewhere in the note.     ASSESSMENT AND PLAN:  AS:  She does have significant aortic stenosis but is not causing any symptoms. Given this no further intervention is necessary. I talked with her and her niece about symptoms that would develop such as shortness of breath. She might if she developed this in the future and it was not controlled through dialysis be a candidate for  TAVR .  However, for now it is a non issue.    PULMONARY HTN:  This can be followed clinically   RBBB:  No change in therapy.     Current medicines are reviewed at length with the patient today.  The patient does not have concerns regarding medicines.  The following changes have been made:  no change  Labs/ tests ordered today include:  No orders of the defined types were placed in this encounter.    Disposition:   FU with me as needed.      Signed, Minus Breeding, MD  01/14/2016 4:01 PM    [facility]

## 2016-01-14 ENCOUNTER — Ambulatory Visit (INDEPENDENT_AMBULATORY_CARE_PROVIDER_SITE_OTHER): Payer: Medicare Other | Admitting: Cardiology

## 2016-01-14 ENCOUNTER — Encounter: Payer: Self-pay | Admitting: Cardiology

## 2016-01-14 VITALS — BP 150/74 | HR 64 | Ht 65.0 in | Wt 131.0 lb

## 2016-01-14 DIAGNOSIS — I350 Nonrheumatic aortic (valve) stenosis: Secondary | ICD-10-CM

## 2016-01-14 NOTE — Patient Instructions (Signed)
Your physician recommends that you schedule a follow-up appointment in: As Needed    

## 2016-01-26 ENCOUNTER — Encounter: Payer: Self-pay | Admitting: Physician Assistant

## 2016-02-10 ENCOUNTER — Telehealth: Payer: Self-pay | Admitting: Emergency Medicine

## 2016-02-10 ENCOUNTER — Ambulatory Visit (INDEPENDENT_AMBULATORY_CARE_PROVIDER_SITE_OTHER): Payer: Medicare Other | Admitting: Physician Assistant

## 2016-02-10 ENCOUNTER — Encounter: Payer: Self-pay | Admitting: Physician Assistant

## 2016-02-10 VITALS — BP 180/62 | Wt 129.0 lb

## 2016-02-10 DIAGNOSIS — K089 Disorder of teeth and supporting structures, unspecified: Secondary | ICD-10-CM

## 2016-02-10 DIAGNOSIS — R634 Abnormal weight loss: Secondary | ICD-10-CM

## 2016-02-10 NOTE — Patient Instructions (Signed)
We will call you with an appointment with [facility].

## 2016-02-10 NOTE — Telephone Encounter (Signed)
Spoke to patient about [facility]. They take walk-ins and require you to pay up front ($[phone]-$[phone]) and insurance reimburse you. They will be able to make impressions and fit her for new dentures on site. Patient expressed understanding. She asked for phone number to call and speak to someone there and she will follow up with them.

## 2016-02-10 NOTE — Progress Notes (Addendum)
Chief Complaint: Weight loss  HPI:  [name] is a 82 year old African-American female, with multiple medical problems including end-stage renal disease on hemodialysis,  who was referred to me by Levin Erp, MD for a complaint of weight loss. The patient has never been seen before in our clinic.    According to chart review patient weighed 146 pounds on 12/26/14 and today weighs 129 pounds. There has been a 2 pound weight loss in the past month.  Today, the patient describes that she had most of her teeth pulled in July and since then has been unable to eat the foods that she enjoys. She explains that she still has an appetite and wants to eat, but when she starts chewing his food starts tasting worse as it takes her longer to chew and the texture is not the same as it was previously. She explains that she went back to her dentist on 3 separate occasions in order to explain that her dentures "didn't seem to meet up with the top teeth", but eventually the dentist just told her that "that's what a knife and fork are for".  Since then the patient has been maintaining a soft food diet, supplementing with protein and nutrition shakes. She is also trying to abide by low-sodium diet for "kidney reasons". She explains that she has been thinking about going to see a new dentist as she feels this is the problem, but is unsure if she wants to spend the money on this.  Patient denies fever, chills, blood in her stool, melena, change in bowel habits, fatigue, anorexia, nausea, vomiting, heartburn, reflux, dysphagia, abdominal pain or symptoms that awaken her at night.   Past Medical History:  Diagnosis Date  . Anemia   . Aortic stenosis   . Benign bladder mass   . Chronic cough   . DJD (degenerative joint disease)   . Dyslipidemia   . ESRD on hemodialysis ([place])   . Gout   . HTN (hypertension)   . Obstructive sleep apnea    mild  . Orthostatic syncope    around year 2006  . Pneumonia   . TIA  (transient ischemic attack)     Past Surgical History:  Procedure Laterality Date  . BONE MARROW BIOPSY    . CATARACT EXTRACTION W/ INTRAOCULAR LENS  IMPLANT, BILATERAL    . COLONOSCOPY W/ BIOPSIES AND POLYPECTOMY    . left arm dialysis graft    . NEPHRECTOMY     left  . REVISION OF ARTERIOVENOUS GORETEX GRAFT Right 11/24/2014   Procedure: REPLACEMENT OF  MEDIAL SIDE OF RIGHT FORARM ARTERIOVENOUS GORETEX GRAFT;  Surgeon: Conrad Brookfield, MD;  Location: [facility];  Service: Vascular;  Laterality: Right;  . REVISION OF ARTERIOVENOUS GORETEX GRAFT Right 11/28/2015   Procedure: REVISION OF RIGHT ARM ARTERIOVENOUS GORETEX GRAFT;  Surgeon: Serafina Mitchell, MD;  Location: [facility];  Service: Vascular;  Laterality: Right;  . TOTAL ABDOMINAL HYSTERECTOMY      Current Outpatient Prescriptions  Medication Sig Dispense Refill  . acetaminophen (TYLENOL) 500 MG tablet Take 1,000 mg by mouth every 6 (six) hours as needed (pain).     Marland Kitchen aspirin 81 MG tablet Take 81 mg by mouth daily.      Marland Kitchen atorvastatin (LIPITOR) 10 MG tablet Take 10 mg by mouth at bedtime.      . calcium acetate (PHOSLO) 667 MG capsule Take 2,001 mg by mouth 3 (three) times daily with meals. Pt only takes twice daily    .  Multiple Vitamin (MULTIVITAMIN) tablet Take 1 tablet by mouth daily. Reported on 11/27/2015    . SENSIPAR 30 MG tablet Take 30 mg by mouth daily.      No current facility-administered medications for this visit.     Allergies as of 02/10/2016 - Review Complete 02/10/2016  Allergen Reaction Noted  . Tape Other (See Comments) 11/27/2015  . Sulfa antibiotics Other (See Comments) 07/21/2010    Family History  Problem Relation Age of Onset  . Hypertension Mother   . Diabetes Mother   . Hypertension Other     Social History   Social History  . Marital status: Widowed    Spouse name: N/A  . Number of children: N/A  . Years of education: N/A   Occupational History  . Retired     Secretary/administrator   Social History Main  Topics  . Smoking status: Never Smoker  . Smokeless tobacco: Never Used  . Alcohol use No  . Drug use: No  . Sexual activity: No   Other Topics Concern  . Not on file   Social History Narrative  . No narrative on file    Review of Systems:     Constitutional: Positive for minimal weight loss in the past month No fever, chills, weakness or fatigue HEENT: Eyes: No change in vision               Ears, Nose, Throat:  No change in hearing or congestion               Mouth: Recent tooth extraction, minimal molars left Skin: No rash or itching Cardiovascular: No chest pain, chest pressure or palpitations   Respiratory: No SOB or cough Gastrointestinal: See HPI and otherwise negative Genitourinary: No dysuria or change in urinary frequency Neurological: No headache, dizziness or syncope Musculoskeletal: No new muscle or joint pain Hematologic: No bleeding or bruising Psychiatric: No history of depression or anxiety    Physical Exam:  Vital signs: BP (!) 180/62   Wt 129 lb (58.5 kg)   BMI 21.47 kg/m   General:   Pleasant Elderly African-American female appears to be in NAD, Well developed, Well nourished, alert and cooperative Head:  Normocephalic and atraumatic. Eyes:   PEERL, EOMI. No icterus. Conjunctiva pink. Ears:  Normal auditory acuity. Neck:  Supple Throat: Oral cavity and pharynx without inflammation, swelling or lesion. Poor dentition missing multiple molars Lungs: Respirations even and unlabored. Lungs clear to auscultation bilaterally.   No wheezes, crackles, or rhonchi.  Heart: Normal S1, S2. 3/6 systolic murmur best heard in the aortic region Regular rate and rhythm. No peripheral edema, cyanosis or pallor.  Abdomen:  Soft, nondistended, nontender. No rebound or guarding. Normal bowel sounds. No appreciable masses or hepatomegaly. Rectal:  Not performed.  Msk:  Symmetrical without gross deformities.  Extremities:  Without edema, no deformity or joint abnormality.    Neurologic:  Alert and  oriented x4;  grossly normal neurologically.   Skin:   Dry and intact without significant lesions or rashes. Psychiatric: Oriented to person, place and time. Demonstrates good judgement and reason without abnormal affect or behaviors.  MOST RECENT LABS AND IMAGING: CBC    Component Value Date/Time   WBC 3.9 (L) 11/30/2015 0455   RBC 3.31 (L) 11/30/2015 0455   HGB 10.2 (L) 11/30/2015 0455   HCT 31.9 (L) 11/30/2015 0455   PLT 85 (L) 11/30/2015 0455   MCV 96.4 11/30/2015 0455   MCH 30.8 11/30/2015 0455   MCHC 32.0  11/30/2015 0455   RDW 15.2 11/30/2015 0455   LYMPHSABS 0.4 (L) 11/27/2015 1555   MONOABS 0.3 11/27/2015 1555   EOSABS 0.0 11/27/2015 1555   BASOSABS 0.0 11/27/2015 1555    CMP     Component Value Date/Time   NA 135 11/30/2015 0455   K 5.1 11/30/2015 0455   CL 98 (L) 11/30/2015 0455   CO2 26 11/30/2015 0455   GLUCOSE 86 11/30/2015 0455   BUN 45 (H) 11/30/2015 0455   CREATININE 7.35 (H) 11/30/2015 0455   CALCIUM 8.8 (L) 11/30/2015 0455   PROT 5.8 (L) 11/30/2015 0455   ALBUMIN 3.0 (L) 11/30/2015 0455   AST 28 11/30/2015 0455   ALT 29 11/30/2015 0455   ALKPHOS 74 11/30/2015 0455   BILITOT 0.6 11/30/2015 0455   GFRNONAA 4 (L) 11/30/2015 0455   GFRAA 5 (L) 11/30/2015 0455    Assessment: 1. Weight loss: Per our records patient has lost around 10-15 pounds in the past year, 2 pounds in the past month; this seems to be related to her recent tooth extraction and removal of molars as well as ill fitting dentures. She is unhappy with her current dentist and has not been back again to see them. Patient also has multiple other chronic medical problems which could be contributing. 2. Poor dentition: thought to be cause of above  Plan: 1. After further discussion in regards to patient's weight loss with her and decrease in appetite it appears that this occurred after her recent teeth extraction in July and since then the patient has had dentures which  do not fit correctly which causes her to be unable to chew. She is able to consume soft foods and is currently supplementing with protein shakes. At this time recommend that she see a new dentist in regards to proper denture fitting in the future. We did put her in contact with a new dentist in the area. 2. Recommend the patient increase her diet supplementation with Boost or Ensure shakes 3. If patient resolves above problem and continues to lose weight, we would be happy to see her back in clinic for further evaluation, including possible CT abdomen and pelvis, but at this time I do not feel this is necessary.  Barbara Newer, PA-C [facility] 02/10/2016, 11:08 AM  Cc: Levin Erp, MD   Addendum: Reviewed and agree with initial management. If not improving after dental modification, then cross sectional imaging recommended  Barbara Bears, MD

## 2016-08-20 ENCOUNTER — Encounter (HOSPITAL_COMMUNITY): Payer: Self-pay

## 2016-08-20 ENCOUNTER — Emergency Department (HOSPITAL_COMMUNITY): Payer: Medicare Other

## 2016-08-20 ENCOUNTER — Inpatient Hospital Stay (HOSPITAL_COMMUNITY)
Admission: EM | Admit: 2016-08-20 | Discharge: 2016-08-25 | DRG: 308 | Disposition: A | Discharge status: Home / Self Care | Payer: Medicare Other | Attending: Nephrology | Admitting: Nephrology

## 2016-08-20 DIAGNOSIS — I120 Hypertensive chronic kidney disease with stage 5 chronic kidney disease or end stage renal disease: Secondary | ICD-10-CM | POA: Diagnosis present

## 2016-08-20 DIAGNOSIS — I4891 Unspecified atrial fibrillation: Secondary | ICD-10-CM | POA: Diagnosis not present

## 2016-08-20 DIAGNOSIS — N2581 Secondary hyperparathyroidism of renal origin: Secondary | ICD-10-CM | POA: Diagnosis not present

## 2016-08-20 DIAGNOSIS — I4510 Unspecified right bundle-branch block: Secondary | ICD-10-CM | POA: Diagnosis present

## 2016-08-20 DIAGNOSIS — I4892 Unspecified atrial flutter: Secondary | ICD-10-CM | POA: Diagnosis not present

## 2016-08-20 DIAGNOSIS — I959 Hypotension, unspecified: Secondary | ICD-10-CM | POA: Diagnosis not present

## 2016-08-20 DIAGNOSIS — Z992 Dependence on renal dialysis: Secondary | ICD-10-CM

## 2016-08-20 DIAGNOSIS — I350 Nonrheumatic aortic (valve) stenosis: Secondary | ICD-10-CM | POA: Diagnosis present

## 2016-08-20 DIAGNOSIS — Z905 Acquired absence of kidney: Secondary | ICD-10-CM

## 2016-08-20 DIAGNOSIS — Z8249 Family history of ischemic heart disease and other diseases of the circulatory system: Secondary | ICD-10-CM

## 2016-08-20 DIAGNOSIS — Z9842 Cataract extraction status, left eye: Secondary | ICD-10-CM

## 2016-08-20 DIAGNOSIS — Z91048 Other nonmedicinal substance allergy status: Secondary | ICD-10-CM

## 2016-08-20 DIAGNOSIS — Z79899 Other long term (current) drug therapy: Secondary | ICD-10-CM

## 2016-08-20 DIAGNOSIS — I248 Other forms of acute ischemic heart disease: Secondary | ICD-10-CM | POA: Diagnosis present

## 2016-08-20 DIAGNOSIS — E8889 Other specified metabolic disorders: Secondary | ICD-10-CM | POA: Diagnosis present

## 2016-08-20 DIAGNOSIS — G4733 Obstructive sleep apnea (adult) (pediatric): Secondary | ICD-10-CM | POA: Diagnosis present

## 2016-08-20 DIAGNOSIS — Z7901 Long term (current) use of anticoagulants: Secondary | ICD-10-CM

## 2016-08-20 DIAGNOSIS — E785 Hyperlipidemia, unspecified: Secondary | ICD-10-CM | POA: Diagnosis present

## 2016-08-20 DIAGNOSIS — D631 Anemia in chronic kidney disease: Secondary | ICD-10-CM | POA: Diagnosis present

## 2016-08-20 DIAGNOSIS — Z833 Family history of diabetes mellitus: Secondary | ICD-10-CM

## 2016-08-20 DIAGNOSIS — N329 Bladder disorder, unspecified: Secondary | ICD-10-CM | POA: Diagnosis present

## 2016-08-20 DIAGNOSIS — Z9841 Cataract extraction status, right eye: Secondary | ICD-10-CM

## 2016-08-20 DIAGNOSIS — Z882 Allergy status to sulfonamides status: Secondary | ICD-10-CM

## 2016-08-20 DIAGNOSIS — R011 Cardiac murmur, unspecified: Secondary | ICD-10-CM | POA: Diagnosis present

## 2016-08-20 DIAGNOSIS — Z7982 Long term (current) use of aspirin: Secondary | ICD-10-CM

## 2016-08-20 DIAGNOSIS — Z8673 Personal history of transient ischemic attack (TIA), and cerebral infarction without residual deficits: Secondary | ICD-10-CM

## 2016-08-20 DIAGNOSIS — N186 End stage renal disease: Secondary | ICD-10-CM | POA: Diagnosis not present

## 2016-08-20 DIAGNOSIS — Z90710 Acquired absence of both cervix and uterus: Secondary | ICD-10-CM

## 2016-08-20 DIAGNOSIS — Z961 Presence of intraocular lens: Secondary | ICD-10-CM | POA: Diagnosis present

## 2016-08-20 DIAGNOSIS — I480 Paroxysmal atrial fibrillation: Secondary | ICD-10-CM | POA: Diagnosis present

## 2016-08-20 LAB — CBC WITH DIFFERENTIAL/PLATELET
Basophils Absolute: 0 10*3/uL (ref 0.0–0.1)
Basophils Relative: 1 %
Eosinophils Absolute: 0.1 10*3/uL (ref 0.0–0.7)
Eosinophils Relative: 3 %
HCT: 32.1 % — ABNORMAL LOW (ref 36.0–46.0)
Hemoglobin: 10.6 g/dL — ABNORMAL LOW (ref 12.0–15.0)
Lymphocytes Relative: 46 %
Lymphs Abs: 2 10*3/uL (ref 0.7–4.0)
MCH: 32.5 pg (ref 26.0–34.0)
MCHC: 33 g/dL (ref 30.0–36.0)
MCV: 98.5 fL (ref 78.0–100.0)
Monocytes Absolute: 0.3 10*3/uL (ref 0.1–1.0)
Monocytes Relative: 6 %
Neutro Abs: 1.8 10*3/uL (ref 1.7–7.7)
Neutrophils Relative %: 44 %
Platelets: 140 10*3/uL — ABNORMAL LOW (ref 150–400)
RBC: 3.26 MIL/uL — ABNORMAL LOW (ref 3.87–5.11)
RDW: 15.2 % (ref 11.5–15.5)
WBC: 4.2 10*3/uL (ref 4.0–10.5)

## 2016-08-20 LAB — BASIC METABOLIC PANEL
ANION GAP: 13 (ref 5–15)
BUN: 9 mg/dL (ref 6–20)
CO2: 29 mmol/L (ref 22–32)
Calcium: 8.6 mg/dL — ABNORMAL LOW (ref 8.9–10.3)
Chloride: 96 mmol/L — ABNORMAL LOW (ref 101–111)
Creatinine, Ser: 2.5 mg/dL — ABNORMAL HIGH (ref 0.44–1.00)
GFR calc Af Amer: 18 mL/min — ABNORMAL LOW (ref >60)
GFR, EST NON AFRICAN AMERICAN: 16 mL/min — AB (ref >60)
Glucose, Bld: 90 mg/dL (ref 65–99)
POTASSIUM: 3.9 mmol/L (ref 3.5–5.1)
SODIUM: 138 mmol/L (ref 135–145)

## 2016-08-20 LAB — TROPONIN I
TROPONIN I: 0.06 ng/mL — AB (ref <0.03)
Troponin I: 0.06 ng/mL (ref <0.03)

## 2016-08-20 LAB — TSH: TSH: 2.4 u[IU]/mL (ref 0.350–4.500)

## 2016-08-20 MED ORDER — METOPROLOL TARTRATE 12.5 MG HALF TABLET
12.5000 mg | ORAL_TABLET | Freq: Two times a day (BID) | ORAL | Status: DC
  Administered 2016-08-20 – 2016-08-21 (×2): 12.5 mg via ORAL
  Filled 2016-08-20 (×2): qty 1

## 2016-08-20 MED ORDER — CINACALCET HCL 30 MG PO TABS
30.0000 mg | ORAL_TABLET | Freq: Every day | ORAL | Status: DC
  Administered 2016-08-21 – 2016-08-25 (×5): 30 mg via ORAL
  Filled 2016-08-20 (×5): qty 1

## 2016-08-20 MED ORDER — DILTIAZEM HCL 25 MG/5ML IV SOLN
10.0000 mg | Freq: Once | INTRAVENOUS | Status: AC
  Administered 2016-08-20: 10 mg via INTRAVENOUS
  Filled 2016-08-20: qty 5

## 2016-08-20 MED ORDER — ACETAMINOPHEN 325 MG PO TABS: 650.0000 mg | ORAL_TABLET | ORAL | Status: DC | PRN

## 2016-08-20 MED ORDER — ASPIRIN EC 81 MG PO TBEC
81.0000 mg | DELAYED_RELEASE_TABLET | Freq: Every day | ORAL | Status: DC
  Administered 2016-08-21 – 2016-08-22 (×2): 81 mg via ORAL
  Filled 2016-08-20 (×2): qty 1

## 2016-08-20 MED ORDER — SODIUM CHLORIDE 0.9 % IV SOLN: 250.0000 mL | INTRAVENOUS | Status: DC | PRN

## 2016-08-20 MED ORDER — ATORVASTATIN CALCIUM 10 MG PO TABS
10.0000 mg | ORAL_TABLET | Freq: Every day | ORAL | Status: DC
  Administered 2016-08-20 – 2016-08-24 (×5): 10 mg via ORAL
  Filled 2016-08-20 (×5): qty 1

## 2016-08-20 MED ORDER — HEPARIN BOLUS VIA INFUSION
1500.0000 [IU] | Freq: Once | INTRAVENOUS | Status: AC
  Administered 2016-08-20: 1500 [IU] via INTRAVENOUS
  Filled 2016-08-20: qty 1500

## 2016-08-20 MED ORDER — ONDANSETRON HCL 4 MG/2ML IJ SOLN: 4.0000 mg | Freq: Four times a day (QID) | INTRAMUSCULAR | Status: DC | PRN

## 2016-08-20 MED ORDER — SODIUM CHLORIDE 0.9% FLUSH: 3.0000 mL | INTRAVENOUS | Status: DC | PRN

## 2016-08-20 MED ORDER — METOPROLOL TARTRATE 5 MG/5ML IV SOLN
5.0000 mg | Freq: Once | INTRAVENOUS | Status: AC
  Administered 2016-08-20: 5 mg via INTRAVENOUS
  Filled 2016-08-20: qty 5

## 2016-08-20 MED ORDER — SODIUM CHLORIDE 0.9% FLUSH
3.0000 mL | Freq: Two times a day (BID) | INTRAVENOUS | Status: DC
  Administered 2016-08-20 – 2016-08-24 (×6): 3 mL via INTRAVENOUS

## 2016-08-20 MED ORDER — HEPARIN (PORCINE) IN NACL 100-0.45 UNIT/ML-% IJ SOLN
750.0000 [IU]/h | INTRAMUSCULAR | Status: DC
  Administered 2016-08-20: 600 [IU]/h via INTRAVENOUS
  Filled 2016-08-20 (×2): qty 250

## 2016-08-20 NOTE — Progress Notes (Signed)
ANTICOAGULATION CONSULT NOTE - Initial Consult  Pharmacy Consult for Heparin  Indication: atrial fibrillation  Allergies  Allergen Reactions  . Tape Other (See Comments)    Pulls off skin  . Sulfa Antibiotics Other (See Comments)    Bumps all over body    Vital Signs: Temp: 98.5 F (36.9 C) (04/14 1610) Temp Source: Oral (04/14 1610) BP: 109/42 (04/14 2130) Pulse Rate: 60 (04/14 2130)  Labs:  Recent Labs  08/20/16 1649  HGB 10.6*  HCT 32.1*  PLT 140*  CREATININE 2.50*  TROPONINI 0.06*    CrCl cannot be calculated (Unknown ideal weight.).   Medical History: Past Medical History:  Diagnosis Date  . Anemia   . Aortic stenosis   . Benign bladder mass   . Chronic cough   . DJD (degenerative joint disease)   . Dyslipidemia   . ESRD on hemodialysis ([place])   . Gout   . HTN (hypertension)   . Obstructive sleep apnea    mild  . Orthostatic syncope    around year 2006  . Pneumonia   . TIA (transient ischemic attack)     Assessment: 83 y/o F presents to ED with new onset afib, pt has hx of aortic stenosis, starting heparin per pharmacy, Hgb 10.6, Plts 140, hx of anemia likely due to ESRD requiring HD, PTA meds reviewed.   Goal of Therapy:  Heparin level 0.3-0.7 units/ml Monitor platelets by anticoagulation protocol: Yes   Plan:  Heparin 1500 units BOLUS Start heparin drip at 600 units/hr 0700 HL Daily CBC/HL Monitor for bleeding   Narda Bonds 08/20/2016,9:56 PM

## 2016-08-20 NOTE — H&P (Addendum)
[name] YQM:[id number] DOB: August 21, 1921 DOA: 08/20/2016     PCP: Criselda Peaches, MD   Outpatient Specialists: cardiology [place] Patient coming from:  home Lives alone     Chief Complaint:  High heart rate  HPI: [name] is a 83 y.o. female with medical history significant of  severe  aortic stenosis, ESRD on HD Tuesday, Thursday Saturday, chronic anemia, benign bladder mass, chronic cough, dyslipidemia, HTN, obstructive sleep apnea mild history of TIA    Presented with  Sensations palpitations she's been in to dialysis for past week and has been told on 3 occasions that her heart rate has been elevated. At first the HD nurses felt maybe they took too much fluid it was adjusted but continued to be tachycardic.  Today she was sent from dialysis to emergency department due to elevated heart rate she herself denies palpitations no chest pain or shortness of breath. She is able to walk up few stairs without shortness of breath. Has hx of remote TIA years ago.  last Hemodialysis was today earlier this morning. Denies history of coronary artery disease or diabetes. Denies any fall. She have had problems with recurrent bleeding from the Graft this is occSIONAL AND THE LAST TIME IT OCCURRED WAS OVER 1 MONTH AGO.  Denies any black stools no blood in stools.   Regarding pertinent Chronic problems:   TTE demonstrated severe AS that had progressed since a previous echo.  Recent echo had a mean gradient of 35 with increased pulmonary pressure peak of 62.   She  Is asymptomatic  Reports have been on HD for the past 12 years. ESRD caused by HTN  IN ER:  Temp (24hrs), Avg:98.5 F (36.9 C), Min:98.5 F (36.9 C), Max:98.5 F (36.9 C)    RR 21 96% HR 68 BP 99/50 TSH 2.4 Na 138 K 3.9 Cr 2.5  WBC 4.2 Hg 10.6 plt 140 Trop 0.06  CXR cardiomagaly  Following Medications were ordered in ER: Medications  diltiazem (CARDIZEM) injection 10 mg (10 mg Intravenous Given 08/20/16 1731)    metoprolol (LOPRESSOR) injection 5 mg (5 mg Intravenous Given 08/20/16 1828)      Hospitalist was called for admission for new onset a.fib  Review of Systems:    Pertinent positives include: none  Constitutional:  No weight loss, night sweats, Fevers, chills, fatigue, weight loss  HEENT:  No headaches, Difficulty swallowing,Tooth/dental problems,Sore throat,  No sneezing, itching, ear ache, nasal congestion, post nasal drip,  Cardio-vascular:  No chest pain, Orthopnea, PND, anasarca, dizziness, palpitations.no Bilateral lower extremity swelling  GI:  No heartburn, indigestion, abdominal pain, nausea, vomiting, diarrhea, change in bowel habits, loss of appetite, melena, blood in stool, hematemesis Resp:  no shortness of breath at rest. No dyspnea on exertion, No excess mucus, no productive cough, No non-productive cough, No coughing up of blood.No change in color of mucus.No wheezing. Skin:  no rash or lesions. No jaundice GU:  no dysuria, change in color of urine, no urgency or frequency. No straining to urinate.  No flank pain.  Musculoskeletal:  No joint pain or no joint swelling. No decreased range of motion. No back pain.  Psych:  No change in mood or affect. No depression or anxiety. No memory loss.  Neuro: no localizing neurological complaints, no tingling, no weakness, no double vision, no gait abnormality, no slurred speech, no confusion  As per HPI otherwise 10 point review of systems negative.   Past Medical History: Past Medical History:  Diagnosis Date  . Anemia   . Aortic stenosis   . Benign bladder mass   . Chronic cough   . DJD (degenerative joint disease)   . Dyslipidemia   . ESRD on hemodialysis ([place])   . Gout   . HTN (hypertension)   . Obstructive sleep apnea    mild  . Orthostatic syncope    around year 2006  . Pneumonia   . TIA (transient ischemic attack)    Past Surgical History:  Procedure Laterality Date  . BONE MARROW BIOPSY    .  CATARACT EXTRACTION W/ INTRAOCULAR LENS  IMPLANT, BILATERAL    . COLONOSCOPY W/ BIOPSIES AND POLYPECTOMY    . left arm dialysis graft    . NEPHRECTOMY     left  . REVISION OF ARTERIOVENOUS GORETEX GRAFT Right 11/24/2014   Procedure: REPLACEMENT OF  MEDIAL SIDE OF RIGHT FORARM ARTERIOVENOUS GORETEX GRAFT;  Surgeon: Conrad [place], MD;  Location: [facility];  Service: Vascular;  Laterality: Right;  . REVISION OF ARTERIOVENOUS GORETEX GRAFT Right 11/28/2015   Procedure: REVISION OF RIGHT ARM ARTERIOVENOUS GORETEX GRAFT;  Surgeon: Serafina Mitchell, MD;  Location: [facility];  Service: Vascular;  Laterality: Right;  . TOTAL ABDOMINAL HYSTERECTOMY       Social History:  Ambulatory   independently      reports that she has never smoked. She has never used smokeless tobacco. She reports that she does not drink alcohol or use drugs.  Allergies:   Allergies  Allergen Reactions  . Tape Other (See Comments)    Pulls off skin  . Sulfa Antibiotics Other (See Comments)    Bumps all over body       Family History:   Family History  Problem Relation Age of Onset  . Hypertension Mother   . Diabetes Mother   . Hypertension Other     Medications: Prior to Admission medications   Medication Sig Start Date End Date Taking? Authorizing Provider  acetaminophen (TYLENOL) 500 MG tablet Take 1,000 mg by mouth every 6 (six) hours as needed (pain).    Yes Historical Provider, MD  aspirin 81 MG tablet Take 81 mg by mouth daily.     Yes Historical Provider, MD  atorvastatin (LIPITOR) 10 MG tablet Take 10 mg by mouth at bedtime.     Yes Historical Provider, MD  multivitamin (RENA-VIT) TABS tablet Take 1 tablet by mouth daily.   Yes Historical Provider, MD  SENSIPAR 30 MG tablet Take 30 mg by mouth daily.  02/14/13  Yes Historical Provider, MD    Physical Exam: Patient Vitals for the past 24 hrs:  BP Temp Temp src Pulse Resp SpO2  08/20/16 1945 (!) 99/50 - - 68 (!) 21 96 %  08/20/16 1930 (!) 97/41 - - 67 15 97  %  08/20/16 1915 (!) 98/49 - - 65 16 99 %  08/20/16 1900 (!) 101/39 - - 68 16 97 %  08/20/16 1845 (!) 99/45 - - 67 17 96 %  08/20/16 1830 (!) 101/46 - - 67 14 100 %  08/20/16 1828 - - - (!) 51 15 100 %  08/20/16 1822 (!) 113/57 - - 70 18 99 %  08/20/16 1815 (!) 101/46 - - 67 (!) 21 100 %  08/20/16 1800 (!) 107/59 - - 70 17 99 %  08/20/16 1745 (!) 102/45 - - 63 18 100 %  08/20/16 1730 (!) 118/47 - - 80 18 100 %  08/20/16 1615 (!) 133/55 - - Marland Kitchen)  113 (!) 21 96 %  08/20/16 1610 113/73 98.5 F (36.9 C) Oral (!) 105 18 100 %  08/20/16 1604 - - - - - 98 %    1. General:  in No Acute distress 2. Psychological: Alert and   Oriented 3. Head/ENT:   Moist    Mucous Membranes                          Head Non traumatic, neck supple                            Poor Dentition 4. SKIN:   decreased Skin turgor,  Skin clean Dry and intact no rash, Right arm graft 5. Heart: irRegular rate and rhythm systolic  Murmur, Rub or gallop 6. Lungs:  no wheezes or crackles   7. Abdomen: Soft,   non-tender, Non distended 8. Lower extremities: no clubbing, cyanosis, or edema 9. Neurologically Grossly intact, moving all 4 extremities equally   10. MSK: Normal range of motion   body mass index is unknown because there is no height or weight on file.  Labs on Admission:   Labs on Admission: I have personally reviewed following labs and imaging studies  CBC:  Recent Labs Lab 08/20/16 1649  WBC 4.2  NEUTROABS 1.8  HGB 10.6*  HCT 32.1*  MCV 98.5  PLT 161*   Basic Metabolic Panel:  Recent Labs Lab 08/20/16 1649  NA 138  K 3.9  CL 96*  CO2 29  GLUCOSE 90  BUN 9  CREATININE 2.50*  CALCIUM 8.6*   GFR: CrCl cannot be calculated (Unknown ideal weight.). Liver Function Tests: No results for input(s): AST, ALT, ALKPHOS, BILITOT, PROT, ALBUMIN in the last 168 hours. No results for input(s): LIPASE, AMYLASE in the last 168 hours. No results for input(s): AMMONIA in the last 168  hours. Coagulation Profile: No results for input(s): INR, PROTIME in the last 168 hours. Cardiac Enzymes:  Recent Labs Lab 08/20/16 1649  TROPONINI 0.06*   BNP (last 3 results) No results for input(s): PROBNP in the last 8760 hours. HbA1C: No results for input(s): HGBA1C in the last 72 hours. CBG: No results for input(s): GLUCAP in the last 168 hours. Lipid Profile: No results for input(s): CHOL, HDL, LDLCALC, TRIG, CHOLHDL, LDLDIRECT in the last 72 hours. Thyroid Function Tests:  Recent Labs  08/20/16 1915  TSH 2.400   Anemia Panel: No results for input(s): VITAMINB12, FOLATE, FERRITIN, TIBC, IRON, RETICCTPCT in the last 72 hours. Urine analysis: No results found for: COLORURINE, APPEARANCEUR, LABSPEC, PHURINE, GLUCOSEU, HGBUR, BILIRUBINUR, KETONESUR, PROTEINUR, UROBILINOGEN, NITRITE, LEUKOCYTESUR Sepsis Labs: @LABRCNTIP (procalcitonin:4,lacticidven:4) )No results found for this or any previous visit (from the past 240 hour(s)).     UA anuric  No results found for: HGBA1C  CrCl cannot be calculated (Unknown ideal weight.).  BNP (last 3 results) No results for input(s): PROBNP in the last 8760 hours.   ECG REPORT  Independently reviewed Rate: 126  Rhythm: a.fib w 2:1 block  RBBB frequent PVC ST&T Change: No acute ischemic changes   QTC 535  There were no vitals filed for this visit.   Cultures:    Component Value Date/Time   SDES BLOOD LEFT HAND 11/29/2015 0551   SPECREQUEST BOTTLES DRAWN AEROBIC AND ANAEROBIC 6CC  11/29/2015 0551   CULT NO GROWTH 5 DAYS 11/29/2015 0551   REPTSTATUS 12/04/2015 FINAL 11/29/2015 0551     Radiological  Exams on Admission: Dg Chest Portable 1 View  Result Date: 08/20/2016 CLINICAL DATA:  Atrial fibrillation. EXAM: PORTABLE CHEST 1 VIEW COMPARISON:  11/27/2015 FINDINGS: Cardiomegaly noted. There is no evidence of focal airspace disease, pulmonary edema, suspicious pulmonary nodule/mass, pleural effusion, or pneumothorax. No  acute bony abnormalities are identified. IMPRESSION: Cardiomegaly without evidence of acute cardiopulmonary disease. Electronically Signed   By: Margarette Canada M.D.   On: 08/20/2016 17:42    Chart has been reviewed    Assessment/Plan  83 y.o. female with medical history significant of  severe  aortic stenosis, ESRD on HD Tuesday, Thursday Saturday, chronic anemia, benign bladder mass, chronic cough, dyslipidemia, HTN, obstructive sleep apnea mild history of TIA admitted for new onset A.fib   Present on Admission:  . New onset a-fib ([place]) -  - Admit to tele  HR controlled       CHA2DS2-VASC score 5       Will start on  Heparin will need decide on long term anticoagualtion                 Check TSH      Cycle cardiac enzymes      Obtain ECHO      Cardiology consult in AM  . ESRD (end stage renal disease) ([place]) - on HD Tues, Thursday, Saturday last HD was today - please notify Nephrology if patient is still here on Tuesday . Secondary hyperparathyroidism ([place]) - contiue home medication elevated troponin likely secondary to demand ischemia in the setting of increased heart rate continued to cycle obtain echogram. Appreciate cardiology consult   Other plan as per orders.  DVT prophylaxis:  heparin     Code Status:  FULL CODE  as per patient    Family Communication:   Family not  at  Bedside     Disposition Plan:     To home once workup is complete and patient is stable                                                  Consults called: [email or web address]  Admission status:  obs   Level of care  tele     I have spent a total of 56 min on this admission   Cordelia Bessinger 08/20/2016, 10:36 PM    [facility]  Pager [phone]   after 2 AM please page floor coverage PA If 7AM-7PM, please contact the day team taking care of the patient  [email or web address]  Password [email or web address]

## 2016-08-20 NOTE — ED Provider Notes (Addendum)
[facility] DEPT Provider Note   CSN: [id number] Arrival date & time: 08/20/16  1558     History   Chief Complaint Chief Complaint  Patient presents with  . Atrial Fibrillation    HPI [name] is a 83 y.o. female.  HPI Patient presents to the emergency department with tachycardia.  The patient states that her dialysis on Tuesday.  The nurses noted that her heart rate was high.  She states that on Thursday they noted the same thing as he was seen by the nephrologist at that point, who thought maybe there is too much fluid being taken off.  Patient states the nurse again noted today that her heart rate was high Saturday emergency department for further evaluation.  Patient states she has no complaintsThe patient denies chest pain, shortness of breath, headache,blurred vision, neck pain, fever, cough, weakness, numbness, dizziness, anorexia, edema, abdominal pain, nausea, vomiting, diarrhea, rash, back pain, dysuria, hematemesis, bloody stool, near syncope, or syncope. Past Medical History:  Diagnosis Date  . Anemia   . Aortic stenosis   . Benign bladder mass   . Chronic cough   . DJD (degenerative joint disease)   . Dyslipidemia   . ESRD on hemodialysis ([place])   . Gout   . HTN (hypertension)   . Obstructive sleep apnea    mild  . Orthostatic syncope    around year 2006  . Pneumonia   . TIA (transient ischemic attack)     Patient Active Problem List   Diagnosis Date Noted  . Atrial fibrillation with RVR ([place]) 08/20/2016  . Aortic stenosis 08/20/2016  . New onset a-fib ([place]) 08/20/2016  . Staphylococcus aureus bacteremia   . 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 Bleeding from dialysis shunt ([place])   . Nausea   . Fever in adult 11/27/2015  . Hemorrhage of arteriovenous graft ([place]) 11/27/2015  . HLD (hyperlipidemia) 11/27/2015  . Bleeding 11/27/2015  . Influenza A with respiratory manifestations 05/08/2013    . Cough 05/08/2013  . Chronic cough 02/15/2013  . Secondary hyperparathyroidism ([place]) 11/21/2012  . ESRD (end stage renal disease) ([place]) 11/20/2012    Past Surgical History:  Procedure Laterality Date  . BONE MARROW BIOPSY    . CATARACT EXTRACTION W/ INTRAOCULAR LENS  IMPLANT, BILATERAL    . COLONOSCOPY W/ BIOPSIES AND POLYPECTOMY    . left arm dialysis graft    . NEPHRECTOMY     left  . REVISION OF ARTERIOVENOUS GORETEX GRAFT Right 11/24/2014   Procedure: REPLACEMENT OF  MEDIAL SIDE OF RIGHT FORARM ARTERIOVENOUS GORETEX GRAFT;  Surgeon: Conrad Edwards, MD;  Location: [facility];  Service: Vascular;  Laterality: Right;  . REVISION OF ARTERIOVENOUS GORETEX GRAFT Right 11/28/2015   Procedure: REVISION OF RIGHT ARM ARTERIOVENOUS GORETEX GRAFT;  Surgeon: Serafina Mitchell, MD;  Location: [facility];  Service: Vascular;  Laterality: Right;  . TOTAL ABDOMINAL HYSTERECTOMY      OB History    No data available       Home Medications    Prior to Admission medications   Medication Sig Start Date End Date Taking? Authorizing Provider  acetaminophen (TYLENOL) 500 MG tablet Take 1,000 mg by mouth every 6 (six) hours as needed (pain).    Yes Historical Provider, MD  aspirin 81 MG tablet Take 81 mg by mouth daily.     Yes Historical Provider, MD  atorvastatin (LIPITOR) 10 MG tablet Take 10 mg by mouth at bedtime.  Yes Historical Provider, MD  multivitamin (RENA-VIT) TABS tablet Take 1 tablet by mouth daily.   Yes Historical Provider, MD  SENSIPAR 30 MG tablet Take 30 mg by mouth daily.  02/14/13  Yes Historical Provider, MD    Family History Family History  Problem Relation Age of Onset  . Hypertension Mother   . Diabetes Mother   . Hypertension Other     Social History Social History  Substance Use Topics  . Smoking status: Never Smoker  . Smokeless tobacco: Never Used  . Alcohol use No     Allergies   Tape and Sulfa antibiotics   Review of Systems Review of Systems All other  systems negative except as documented in the HPI. All pertinent positives and negatives as reviewed in the HPI.  Physical Exam Updated Vital Signs BP (!) 117/45   Pulse 64   Temp 98.5 F (36.9 C) (Oral)   Resp (!) 22   SpO2 98%   Physical Exam  Constitutional: She is oriented to person, place, and time. She appears well-developed and well-nourished. No distress.  HENT:  Head: Normocephalic and atraumatic.  Mouth/Throat: Oropharynx is clear and moist.  Eyes: Pupils are equal, round, and reactive to light.  Neck: Normal range of motion. Neck supple.  Cardiovascular: Normal heart sounds.  An irregularly irregular rhythm present. Tachycardia present.  Exam reveals no gallop and no friction rub.   No murmur heard. Pulmonary/Chest: Effort normal and breath sounds normal. No respiratory distress. She has no wheezes.  Abdominal: Soft. Bowel sounds are normal. She exhibits no distension. There is no tenderness.  Neurological: She is alert and oriented to person, place, and time. She exhibits normal muscle tone. Coordination normal.  Skin: Skin is warm and dry. Capillary refill takes less than 2 seconds. No rash noted. No erythema.  Psychiatric: She has a normal mood and affect. Her behavior is normal.  Nursing note and vitals reviewed.    ED Treatments / Results  Labs (all labs ordered are listed, but only abnormal results are displayed) Labs Reviewed  BASIC METABOLIC PANEL - Abnormal; Notable for the following:       Result Value   Chloride 96 (*)    Creatinine, Ser 2.50 (*)    Calcium 8.6 (*)    GFR calc non Af Amer 16 (*)    GFR calc Af Amer 18 (*)    All other components within normal limits  CBC WITH DIFFERENTIAL/PLATELET - Abnormal; Notable for the following:    RBC 3.26 (*)    Hemoglobin 10.6 (*)    HCT 32.1 (*)    Platelets 140 (*)    All other components within normal limits  TROPONIN I - Abnormal; Notable for the following:    Troponin I 0.06 (*)    All other  components within normal limits  TSH    EKG  EKG Interpretation  Date/Time:  Saturday August 20 2016 16:07:39 EDT Ventricular Rate:  126 PR Interval:    QRS Duration: 148 QT Interval:  369 QTC Calculation: 535 R Axis:   -3 Text Interpretation:  Atrial flutter with predominant 2:1 AV block Ventricular premature complex Right bundle branch block Confirmed by HAVILAND MD, JULIE ([id number]) on 08/20/2016 4:20:40 PM       Radiology Dg Chest Portable 1 View  Result Date: 08/20/2016 CLINICAL DATA:  Atrial fibrillation. EXAM: PORTABLE CHEST 1 VIEW COMPARISON:  11/27/2015 FINDINGS: Cardiomegaly noted. There is no evidence of focal airspace disease, pulmonary edema, suspicious pulmonary nodule/mass,  pleural effusion, or pneumothorax. No acute bony abnormalities are identified. IMPRESSION: Cardiomegaly without evidence of acute cardiopulmonary disease. Electronically Signed   By: Margarette Canada M.D.   On: 08/20/2016 17:42    Procedures Procedures (including critical care time)  Medications Ordered in ED Medications  diltiazem (CARDIZEM) injection 10 mg (10 mg Intravenous Given 08/20/16 1731)  metoprolol (LOPRESSOR) injection 5 mg (5 mg Intravenous Given 08/20/16 1828)     Initial Impression / Assessment and Plan / ED Course  I have reviewed the triage vital signs and the nursing notes.  Pertinent labs & imaging results that were available during my care of the patient were reviewed by me and considered in my medical decision making (see chart for details).     She will need admission to the hospital for new onset atrial fibrillation.  The patient is advised of the plan.  I spoke with the [facility] hospitalist.  We did give the patient Cardizem and Lopressor, which has controlled, her heart rate to this point.    Final Clinical Impressions(s) / ED Diagnoses   Final diagnoses:  New onset atrial fibrillation [facility])    New Prescriptions New Prescriptions   No medications on file       Dalia Heading, PA-C 08/20/16 2139    Isla Pence, MD 08/20/16 [place], PA-C 08/20/16 2240    Isla Pence, MD 08/20/16 2244

## 2016-08-20 NOTE — ED Triage Notes (Signed)
Pt from dialysis by [facility] with new onset a-fib with rate between 110-140. Pt has no pain or SOB

## 2016-08-21 DIAGNOSIS — I4891 Unspecified atrial fibrillation: Secondary | ICD-10-CM

## 2016-08-21 DIAGNOSIS — I350 Nonrheumatic aortic (valve) stenosis: Secondary | ICD-10-CM | POA: Diagnosis not present

## 2016-08-21 LAB — BASIC METABOLIC PANEL
ANION GAP: 9 (ref 5–15)
BUN: 18 mg/dL (ref 6–20)
CHLORIDE: 98 mmol/L — AB (ref 101–111)
CO2: 30 mmol/L (ref 22–32)
Calcium: 8.4 mg/dL — ABNORMAL LOW (ref 8.9–10.3)
Creatinine, Ser: 3.53 mg/dL — ABNORMAL HIGH (ref 0.44–1.00)
GFR calc Af Amer: 12 mL/min — ABNORMAL LOW (ref >60)
GFR calc non Af Amer: 10 mL/min — ABNORMAL LOW (ref >60)
GLUCOSE: 89 mg/dL (ref 65–99)
POTASSIUM: 3.9 mmol/L (ref 3.5–5.1)
SODIUM: 137 mmol/L (ref 135–145)

## 2016-08-21 LAB — CBC
HCT: 31.4 % — ABNORMAL LOW (ref 36.0–46.0)
Hemoglobin: 9.8 g/dL — ABNORMAL LOW (ref 12.0–15.0)
MCH: 31.1 pg (ref 26.0–34.0)
MCHC: 31.2 g/dL (ref 30.0–36.0)
MCV: 99.7 fL (ref 78.0–100.0)
PLATELETS: 122 10*3/uL — AB (ref 150–400)
RBC: 3.15 MIL/uL — ABNORMAL LOW (ref 3.87–5.11)
RDW: 15 % (ref 11.5–15.5)
WBC: 3.7 10*3/uL — ABNORMAL LOW (ref 4.0–10.5)

## 2016-08-21 LAB — LIPID PANEL
CHOL/HDL RATIO: 2.1 ratio
CHOLESTEROL: 143 mg/dL (ref 0–200)
HDL: 68 mg/dL (ref >40)
LDL Cholesterol: 63 mg/dL (ref 0–99)
Triglycerides: 62 mg/dL (ref <150)
VLDL: 12 mg/dL (ref 0–40)

## 2016-08-21 LAB — TROPONIN I: TROPONIN I: 0.06 ng/mL — AB (ref <0.03)

## 2016-08-21 LAB — HEPARIN LEVEL (UNFRACTIONATED): Heparin Unfractionated: 0.26 IU/mL — ABNORMAL LOW (ref 0.30–0.70)

## 2016-08-21 MED ORDER — WARFARIN - PHARMACIST DOSING INPATIENT
Freq: Every day | Status: DC
  Administered 2016-08-23: 18:00:00

## 2016-08-21 MED ORDER — WARFARIN SODIUM 5 MG PO TABS
5.0000 mg | ORAL_TABLET | Freq: Once | ORAL | Status: AC
  Administered 2016-08-21: 5 mg via ORAL
  Filled 2016-08-21: qty 1

## 2016-08-21 MED ORDER — METOPROLOL TARTRATE 25 MG PO TABS
25.0000 mg | ORAL_TABLET | Freq: Two times a day (BID) | ORAL | Status: DC
  Administered 2016-08-21 – 2016-08-22 (×2): 25 mg via ORAL
  Filled 2016-08-21 (×2): qty 1

## 2016-08-21 MED ORDER — WARFARIN SODIUM 5 MG PO TABS: 5.0000 mg | ORAL_TABLET | Freq: Once | ORAL | Status: DC

## 2016-08-21 MED ORDER — OFF THE BEAT BOOK
Freq: Once | Status: AC
  Administered 2016-08-22: 08:00:00
  Filled 2016-08-21: qty 1

## 2016-08-21 NOTE — Progress Notes (Signed)
ANTICOAGULATION CONSULT NOTE - Follow Up Consult  Pharmacy Consult for Heparin Indication: atrial fibrillation  Assessment: 83 yo female admitted 4/14 for new onset Afib. This morning's heparin level is 0.26 (subtherapeutic) on 600 units/hr. Hemoglobin and platelets are low (9.8, 122). Per RN Josh, no issues with line or bleeding.   Goal of Therapy:  Heparin level 0.3-0.7 units/ml Monitor platelets by anticoagulation protocol: Yes   Plan:  Increase heparin drip to 750 units/hr  Daily CBC/HL Monitor for bleeding Follow-up ECHO results and plans for oral anticoagulation   Allergies  Allergen Reactions  . Tape Other (See Comments)    Pulls off skin  . Sulfa Antibiotics Other (See Comments)    Bumps all over body    Patient Measurements: Height: 5\' 5"  (165.1 cm) Weight: 121 lb 14.6 oz (55.3 kg) IBW/kg (Calculated) : 57 Heparin Dosing Weight: 55.3 kg  Vital Signs: Temp: 98 F (36.7 C) (04/15 0436) Temp Source: Oral (04/15 0436) BP: 93/59 (04/15 0445) Pulse Rate: 67 (04/15 0436)  Labs:  Recent Labs  08/20/16 1649 08/20/16 2253 08/21/16 0429 08/21/16 0649  HGB 10.6*  --  9.8*  --   HCT 32.1*  --  31.4*  --   PLT 140*  --  122*  --   HEPARINUNFRC  --   --   --  0.26*  CREATININE 2.50*  --  3.53*  --   TROPONINI 0.06* 0.06* 0.06*  --     Estimated Creatinine Clearance: 8.7 mL/min (A) (by C-G formula based on SCr of 3.53 mg/dL (H)).  Medications:  Infusions:  . heparin 600 Units/hr (08/20/16 2230)    Belia Heman, PharmD PGY1 Pharmacy Resident [phone] (Pager) 08/21/2016 8:31 AM

## 2016-08-21 NOTE — Consult Note (Signed)
Cardiology Consult    Patient ID: [name] MRN: [id number], DOB/AGE: [birth year]   Admit date: 08/20/2016 Date of Consult: 08/21/2016  Primary Physician: Criselda Peaches, MD Reason for Consult: Atrial Fibrillation Primary Cardiologist: Dr. Percival Spanish Requesting Provider: Dr. Roel Cluck   History of Present Illness    [name] is a 83 y.o. female with past medical history of severe AS (by echo in 11/2015), chronic anemia, HTN, HLD, prior TIA, pulmonary HTN, and ESRD (on HD-T,TH,SAT) who presented to [facility] ED on 08/20/2016 for evaluation of palpitations. Cardiology is asked to see the patient in consult for atrial fibrillation at the request of Dr. Roel Cluck.   She was last seen by Dr. Percival Spanish in 01/2016 and reported doing well from a cardiac perspective at that time, denying any recent chest pain, dyspnea on exertion, orthopnea, PND, or palpitations. Due to her severe AS not causing symptoms at that time, no further intervention was felt to be necessary. TAVR was briefly discussed with the patient and her family if she did develop symptoms in the future.   She had been in her usual state of health until yesterday afternoon, for while at HD she was found to have gone into atrial fibrillation with RVR.   In talking with the patient today, she reports being asymptomatic with this. Had been told on Tuesday and Thursday that her HR was elevated while at HD but they thought it was secondary to too much fluid being removed. Yesterday, her HR went into the 140's during her HD session and an EKG was obtained which showed atrial fibrillation. She is unaware of any prior history of this. She is asymptomatic, denying any recent chest pain, palpitations, dyspnea, orthopnea, or presyncope.   She is still very active for her age, going to the grocery store independently and driving. Performs ADL's independently without any recent anginal symptoms.   Initial labs showed WBC of 4.2, Hgb  10.6, and platelets of 140. K+ 3.9. Creatinine 2.50. TSH normal at 2.4. Cyclic troponin values have been flat at 0.06, 0.06, and 0.06. CXR showed cardiomegaly without evidence of acute cardiopulmonary disease. EKG shows atrial fibrillation, HR 126.  She has been started on Lopressor 12.63m BID for rate-control but HR is still variable in the 110's - 130's. Currently on Heparin for anticoagulation.   Past Medical History   Past Medical History:  Diagnosis Date  . Anemia   . Aortic stenosis   . Benign bladder mass   . Chronic cough   . DJD (degenerative joint disease)   . Dyslipidemia   . ESRD on hemodialysis ([place]   . Gout   . HTN (hypertension)   . Obstructive sleep apnea    mild  . Orthostatic syncope    around year 2006  . Pneumonia   . TIA (transient ischemic attack)     Past Surgical History:  Procedure Laterality Date  . BONE MARROW BIOPSY    . CATARACT EXTRACTION W/ INTRAOCULAR LENS  IMPLANT, BILATERAL    . COLONOSCOPY W/ BIOPSIES AND POLYPECTOMY    . left arm dialysis graft    . NEPHRECTOMY     left  . REVISION OF ARTERIOVENOUS GORETEX GRAFT Right 11/24/2014   Procedure: REPLACEMENT OF  MEDIAL SIDE OF RIGHT FORARM ARTERIOVENOUS GORETEX GRAFT;  Surgeon: BConrad Powhatan MD;  Location[facility]  Service: Vascular;  Laterality: Right;  . REVISION OF ARTERIOVENOUS GORETEX GRAFT Right 11/28/2015   Procedure: REVISION OF RIGHT ARM ARTERIOVENOUS GORETEX  GRAFT;  Surgeon: Serafina Mitchell, MD;  Location: [facility];  Service: Vascular;  Laterality: Right;  . TOTAL ABDOMINAL HYSTERECTOMY       Allergies  Allergies  Allergen Reactions  . Tape Other (See Comments)    Pulls off skin  . Sulfa Antibiotics Other (See Comments)    Bumps all over body    Inpatient Medications    . aspirin EC  81 mg Oral Daily  . atorvastatin  10 mg Oral QHS  . cinacalcet  30 mg Oral Q breakfast  . metoprolol tartrate  12.5 mg Oral BID  . sodium chloride flush  3 mL Intravenous Q12H    Family History     Family History  Problem Relation Age of Onset  . Hypertension Mother   . Diabetes Mother   . Hypertension Other     Social History    Social History   Social History  . Marital status: Widowed    Spouse name: N/A  . Number of children: N/A  . Years of education: N/A   Occupational History  . Retired     Secretary/administrator   Social History Main Topics  . Smoking status: Never Smoker  . Smokeless tobacco: Never Used  . Alcohol use No  . Drug use: No  . Sexual activity: No   Other Topics Concern  . Not on file   Social History Narrative  . No narrative on file     Review of Systems    General:  No chills, fever, night sweats or weight changes.  Cardiovascular:  No chest pain, dyspnea on exertion, edema, orthopnea, palpitations, paroxysmal nocturnal dyspnea. Positive for elevated heart rate.  Dermatological: No rash, lesions/masses Respiratory: No cough, dyspnea Urologic: No hematuria, dysuria Abdominal:   No nausea, vomiting, diarrhea, bright red blood per rectum, melena, or hematemesis Neurologic:  No visual changes, wkns, changes in mental status. All other systems reviewed and are otherwise negative except as noted above.  Physical Exam    Blood pressure 101/62, pulse (!) 110, temperature 98 F (36.7 C), temperature source Oral, resp. rate 18, height _0  (1.651 m), weight 121 lb 14.6 oz (55.3 kg), SpO2 98 %.  General: Pleasant, elderly African American female appearing in NAD Psych: Normal affect. Neuro: Alert and oriented X 3. Moves all extremities spontaneously. HEENT: Normal  Neck: Supple without bruits or JVD. Lungs:  Resp regular and unlabored, CTA without wheezing or rales. Heart: Irregularly irregular, no s3, s4, 3/6 SEM along RUSB. Abdomen: Soft, non-tender, non-distended, BS + x 4.  Extremities: No clubbing, cyanosis or edema. DP/PT/Radials 2+ and equal bilaterally.  Labs    Troponin (Point of Care Test) No results for input(s): TROPIPOC in the  last 72 hours.  Recent Labs  08/20/16 1649 08/20/16 2253 08/21/16 0429  TROPONINI 0.06* 0.06* 0.06*   Lab Results  Component Value Date   WBC 3.7 (L) 08/21/2016   HGB 9.8 (L) 08/21/2016   HCT 31.4 (L) 08/21/2016   MCV 99.7 08/21/2016   PLT 122 (L) 08/21/2016    Recent Labs Lab 08/21/16 0429  NA 137  K 3.9  CL 98*  CO2 30  BUN 18  CREATININE 3.53*  CALCIUM 8.4*  GLUCOSE 89   Lab Results  Component Value Date   CHOL 143 08/21/2016   HDL 68 08/21/2016   LDLCALC 63 08/21/2016   TRIG 62 08/21/2016   No results found for: [facility]   Radiology Studies    Dg Chest Portable 1 View  Result Date: 08/20/2016 CLINICAL DATA:  Atrial fibrillation. EXAM: PORTABLE CHEST 1 VIEW COMPARISON:  11/27/2015 FINDINGS: Cardiomegaly noted. There is no evidence of focal airspace disease, pulmonary edema, suspicious pulmonary nodule/mass, pleural effusion, or pneumothorax. No acute bony abnormalities are identified. IMPRESSION: Cardiomegaly without evidence of acute cardiopulmonary disease. Electronically Signed   By: Margarette Canada M.D.   On: 08/20/2016 17:42    EKG & Cardiac Imaging    EKG: Atrial fibrillation, HR 126 - Personally Reviewed  Echocardiogram: 11/2015 Study Conclusions  - Left ventricle: The cavity size was normal. There was mild focal   basal hypertrophy of the septum. Systolic function was normal.   The estimated ejection fraction was in the range of 60% to 65%.   Wall motion was normal; there were no regional wall motion   abnormalities. Features are consistent with a pseudonormal left   ventricular filling pattern, with concomitant abnormal relaxation   and increased filling pressure (grade 2 diastolic dysfunction). - Aortic valve: Trileaflet; moderately thickened, moderately   calcified leaflets. There was severe stenosis. There was mild   regurgitation. Mean gradient (S): 35 mm Hg. Peak gradient (S): 67   mm Hg. VTI ratio of LVOT to aortic valve: 0.31. Valve area  (VTI):   1.06 cm^2. Valve area (Vmax): 1.01 cm^2. Valve area (Vmean): 0.99   cm^2. - Mitral valve: Calcified annulus. Mildly calcified leaflets .   There was trivial regurgitation. - Left atrium: The atrium was mildly to moderately dilated. - Right atrium: Central venous pressure (est): 8 mm Hg. - Atrial septum: No defect or patent foramen ovale was identified. - Tricuspid valve: There was mild regurgitation. - Pulmonary arteries: Systolic pressure was severely increased. PA   peak pressure: 62 mm Hg (S). - Pericardium, extracardiac: A trivial pericardial effusion was   identified posterior to the heart.  Impressions:  - Mild basal septal LV hypertrophy with LVEF 60-65%. Grade 2   diastolic dysfunction with increased LV filling pressure. Mild to   moderate left atrial enlargement. MAC with mildly calcified   mitral leaflets and trivial mitral regurgitation. Severe calcific   aortic stenosis as outlined above with mild aortic regurgitation.   Mild tricuspid regurgitation with evidence of severe pulmonary   hypertension, PA S/P estimated 62 mmHg. Trivial posterior   pericardial effusion. No definitive valvular vegetations noted.  Assessment & Plan    1. New Onset Atrial Fibrillation - noted to have an elevated HR during her HD sessions this week. HR went into the 140's during HD yesterday with an EKG showing new-onset atrial fibrillation. She is asymptomatic with this, denying any recent chest pain, palpitations, dyspnea, orthopnea, or presyncope.  -  K+ 3.9. TSH 2.4. Cyclic troponin values have been flat at 0.06, 0.06, and 0.06.  - This patients CHA2DS2-VASc Score and unadjusted Ischemic Stroke Rate (% per year) is equal to 9.7 % stroke rate/year from a score of 6 (HTN, Female, Age (2), prior TIA (2)). Currently on Heparin. She is not a good candidate for a NOAC with her ESRD and known severe AS. Would need to consider initiation of Coumadin with close monitoring of INR in the setting  of her chronic anemia. Patient wishes to discuss this further with her family.  - started on Lopressor 12.32m BID for rate-control but HR is still variable in the 110's - 130's. Would recommend increasing to 266mBID and hold for SBP < 90. I am concerned she might experience hypotension on HD days, therefore BP would need to be  followed closely. May need the addition of Amiodarone if BP does not allow for further titration of BB therapy and HR remains elevated.     2. Severe Aortic Stenosis - echo in 11/2015 showed severe stenosis with a mean gradient of 35 mm Hg, peak gradient 67 mm Hg and Valve area (Vmax) of 1.01 cm^2.  - she denies any recent dyspnea with exertion, orthopnea, PND, or syncope.   3. HLD - remains on Atorvastatin 36m daily.   4. ESRD - on HD (T,TH, SAT schedule).  5. Anemia of Chronic Disease - Hgb at 10.6 on admission, close to baseline.  - she denies any evidence of active bleeding.    Signed, BErma Heritage PA-C 08/21/2016, 9:49 AM Pager: [phone] History and all data above reviewed.  Patient examined.  I agree with the findings as above.  She does not feel palpitations.  She has no chest pain or SOB.  She denies any presyncope or syncope.   The patient exam reveals CEKB:TCYELYHTMwith 3/6 mid to late peaking systolic murmur  ,  Lungs: Decreased breath sounds slightly at both bases  ,  Abd: Positive bowel sounds, no rebound no guarding, Ext No edema  .  All available labs, radiology testing, previous records reviewed. Agree with documented assessment and plan. She agrees to start warfarin after talking to her sons.  She has no high risk findings that would contraindicate this. She does not need bridging.  We will follow the warfarin levels.  She could go home if she ambulates and her heart rate is reasonably controlled.  If in the future rate is hard to control and she has been on warfarin for three weeks I would plan out patient DCCV.    JJeneen RinksHochrein  11:08 AM   08/21/2016

## 2016-08-21 NOTE — Discharge Instructions (Addendum)
You have an appointment Monday at 3 pm for coumadin clinic to check levels- please do not miss.    Information on my medicine - Coumadin   (Warfarin)  This medication education was reviewed with me or my healthcare representative as part of my discharge preparation.  The pharmacist that spoke with me during my hospital stay was:  Kai Levins, [facility]  Why was Coumadin prescribed for you? Coumadin was prescribed for you because you have a blood clot or a medical condition that can cause an increased risk of forming blood clots. Blood clots can cause serious health problems by blocking the flow of blood to the heart, lung, or brain. Coumadin can prevent harmful blood clots from forming. As a reminder your indication for Coumadin is:   Stroke Prevention Because Of Atrial Fibrillation  What test will check on my response to Coumadin? While on Coumadin (warfarin) you will need to have an INR test regularly to ensure that your dose is keeping you in the desired range. The INR (international normalized ratio) number is calculated from the result of the laboratory test called prothrombin time (PT).  If an INR APPOINTMENT HAS NOT ALREADY BEEN MADE FOR YOU please schedule an appointment to have this lab work done by your health care provider within 7 days. Your INR goal is usually a number between:  2 to 3 or your provider may give you a more narrow range like 2-2.5.  Ask your health care provider during an office visit what your goal INR is.  What  do you need to  know  About  COUMADIN? Take Coumadin (warfarin) exactly as prescribed by your healthcare provider about the same time each day.  DO NOT stop taking without talking to the doctor who prescribed the medication.  Stopping without other blood clot prevention medication to take the place of Coumadin may increase your risk of developing a new clot or stroke.  Get refills before you run out.  What do you do if you miss a dose? If you miss a dose,  take it as soon as you remember on the same day then continue your regularly scheduled regimen the next day.  Do not take two doses of Coumadin at the same time.  Important Safety Information A possible side effect of Coumadin (Warfarin) is an increased risk of bleeding. You should call your healthcare provider right away if you experience any of the following: ? Bleeding from an injury or your nose that does not stop. ? Unusual colored urine (red or dark brown) or unusual colored stools (red or black). ? Unusual bruising for unknown reasons. ? A serious fall or if you hit your head (even if there is no bleeding).  Some foods or medicines interact with Coumadin (warfarin) and might alter your response to warfarin. To help avoid this: ? Eat a balanced diet, maintaining a consistent amount of Vitamin K. ? Notify your provider about major diet changes you plan to make. ? Avoid alcohol or limit your intake to 1 drink for women and 2 drinks for men per day. (1 drink is 5 oz. wine, 12 oz. beer, or 1.5 oz. liquor.)  Make sure that ANY health care provider who prescribes medication for you knows that you are taking Coumadin (warfarin).  Also make sure the healthcare provider who is monitoring your Coumadin knows when you have started a new medication including herbals and non-prescription products.  Coumadin (Warfarin)  Major Drug Interactions  Increased Warfarin Effect Decreased  Warfarin Effect  Alcohol (large quantities) Antibiotics (esp. Septra/Bactrim, Flagyl, Cipro) Amiodarone (Cordarone) Aspirin (ASA) Cimetidine (Tagamet) Megestrol (Megace) NSAIDs (ibuprofen, naproxen, etc.) Piroxicam (Feldene) Propafenone (Rythmol SR) Propranolol (Inderal) Isoniazid (INH) Posaconazole (Noxafil) Barbiturates (Phenobarbital) Carbamazepine (Tegretol) Chlordiazepoxide (Librium) Cholestyramine (Questran) Griseofulvin Oral Contraceptives Rifampin Sucralfate (Carafate) Vitamin K   Coumadin  (Warfarin) Major Herbal Interactions  Increased Warfarin Effect Decreased Warfarin Effect  Garlic Ginseng Ginkgo biloba Coenzyme Q10 Green tea St. Johns wort    Coumadin (Warfarin) FOOD Interactions  Eat a consistent number of servings per week of foods HIGH in Vitamin K (1 serving =  cup)  Collards (cooked, or boiled & drained) Kale (cooked, or boiled & drained) Mustard greens (cooked, or boiled & drained) Parsley *serving size only =  cup Spinach (cooked, or boiled & drained) Swiss chard (cooked, or boiled & drained) Turnip greens (cooked, or boiled & drained)  Eat a consistent number of servings per week of foods MEDIUM-HIGH in Vitamin K (1 serving = 1 cup)  Asparagus (cooked, or boiled & drained) Broccoli (cooked, boiled & drained, or raw & chopped) Brussel sprouts (cooked, or boiled & drained) *serving size only =  cup Lettuce, raw (green leaf, endive, romaine) Spinach, raw Turnip greens, raw & chopped   These websites have more information on Coumadin (warfarin):  FailFactory.se; VeganReport.com.au;

## 2016-08-21 NOTE — Progress Notes (Signed)
PROGRESS NOTE    [name]  VOZ:[id number] DOB: January 14, 1922 DOA: 08/20/2016 PCP: Criselda Peaches, MD     Brief Narrative:  [name] is a 83 y.o. female with past medical history significant of severe aortic stenosis, ESRD on HD Tuesday, Thursday Saturday, chronic anemia, benign bladder mass, chronic cough, dyslipidemia, HTN, obstructive sleep apnea, and history of TIA who presented with tachycardia. She states that in the past week at dialysis sessions, they have told her that her heart rate was very elevated. Initially, they thought they took too much fluid, however, she continued to be tachycardic throughout the week. She was sent from dialysis to emergency department and EKG revealed new onset atrial fibrillation with RVR. She remains asymptomatic. Denies any chest pain, shortness of breath or feeling heart palpitations. She is a very functional 83 year old, lives alone and still driving.  Assessment & Plan:   Active Problems:   ESRD (end stage renal disease) ([place])   Secondary hyperparathyroidism ([place])   Atrial fibrillation with RVR ([facility])   Aortic stenosis   New onset a-fib ([place])  New onset atrial fibrillation -CHADSVASc 6 -Heparin drip started overnight -Appreciate cardiology  -Lopressor, hold for SBP < 90 -Echo ordered  ESRD on dialysis T/Th/Sat -Last HD Saturday 4/14 -Will notify nephrology if patient still here on Tuesday  HLD -Lipitor   Chronic anemia of chronic disease -Hgb stable    DVT prophylaxis: heparin gtt Code Status: full Family Communication: no family at bedside Disposition Plan: pending improvement    Consultants:   Cardiology  Procedures:   None  Antimicrobials:   None    Subjective: Patient feeling well this morning. No complaints, denies chest pain, palpitation, shortness of breath, nausea/vomiting.   Objective: Vitals:   08/20/16 2323 08/21/16 0436 08/21/16 0445 08/21/16 0906  BP:  (!) 100/36 (!) 93/59 101/62    Pulse: 75 67  (!) 110  Resp:  18    Temp:  98 F (36.7 C)    TempSrc:  Oral    SpO2:  98%    Weight:  55.3 kg (121 lb 14.6 oz)    Height:        Intake/Output Summary (Last 24 hours) at 08/21/16 1035 Last data filed at 08/21/16 0908  Gross per 24 hour  Intake              290 ml  Output                0 ml  Net              290 ml   Filed Weights   08/20/16 2224 08/21/16 0436  Weight: 55.4 kg (122 lb 2.2 oz) 55.3 kg (121 lb 14.6 oz)    Examination:  General exam: Appears calm and comfortable  Respiratory system: Clear to auscultation. Respiratory effort normal. Cardiovascular system: S1 & S2 heard, Irreg rhythm, rate 425Z, +systolic murmur Gastrointestinal system: Abdomen is nondistended, soft and nontender. No organomegaly or masses felt. Normal bowel sounds heard. Central nervous system: Alert and oriented. No focal neurological deficits. Extremities: Symmetric 5 x 5 power. Skin: No rashes, lesions or ulcers Psychiatry: Judgement and insight appear normal. Mood & affect appropriate.   Data Reviewed: I have personally reviewed following labs and imaging studies  CBC:  Recent Labs Lab 08/20/16 1649 08/21/16 0429  WBC 4.2 3.7*  NEUTROABS 1.8  --   HGB 10.6* 9.8*  HCT 32.1* 31.4*  MCV 98.5 99.7  PLT 140* 122*  Basic Metabolic Panel:  Recent Labs Lab 08/20/16 1649 08/21/16 0429  NA 138 137  K 3.9 3.9  CL 96* 98*  CO2 29 30  GLUCOSE 90 89  BUN 9 18  CREATININE 2.50* 3.53*  CALCIUM 8.6* 8.4*   GFR: Estimated Creatinine Clearance: 8.7 mL/min (A) (by C-G formula based on SCr of 3.53 mg/dL (H)). Liver Function Tests: No results for input(s): AST, ALT, ALKPHOS, BILITOT, PROT, ALBUMIN in the last 168 hours. No results for input(s): LIPASE, AMYLASE in the last 168 hours. No results for input(s): AMMONIA in the last 168 hours. Coagulation Profile: No results for input(s): INR, PROTIME in the last 168 hours. Cardiac Enzymes:  Recent Labs Lab  08/20/16 1649 08/20/16 2253 08/21/16 0429  TROPONINI 0.06* 0.06* 0.06*   BNP (last 3 results) No results for input(s): PROBNP in the last 8760 hours. HbA1C: No results for input(s): HGBA1C in the last 72 hours. CBG: No results for input(s): GLUCAP in the last 168 hours. Lipid Profile:  Recent Labs  08/21/16 0429  CHOL 143  HDL 68  LDLCALC 63  TRIG 62  CHOLHDL 2.1   Thyroid Function Tests:  Recent Labs  08/20/16 1915  TSH 2.400   Anemia Panel: No results for input(s): VITAMINB12, FOLATE, FERRITIN, TIBC, IRON, RETICCTPCT in the last 72 hours. Sepsis Labs: No results for input(s): PROCALCITON, LATICACIDVEN in the last 168 hours.  No results found for this or any previous visit (from the past 240 hour(s)).     Radiology Studies: Dg Chest Portable 1 View  Result Date: 08/20/2016 CLINICAL DATA:  Atrial fibrillation. EXAM: PORTABLE CHEST 1 VIEW COMPARISON:  11/27/2015 FINDINGS: Cardiomegaly noted. There is no evidence of focal airspace disease, pulmonary edema, suspicious pulmonary nodule/mass, pleural effusion, or pneumothorax. No acute bony abnormalities are identified. IMPRESSION: Cardiomegaly without evidence of acute cardiopulmonary disease. Electronically Signed   By: Margarette Canada M.D.   On: 08/20/2016 17:42      Scheduled Meds: . aspirin EC  81 mg Oral Daily  . atorvastatin  10 mg Oral QHS  . cinacalcet  30 mg Oral Q breakfast  . metoprolol tartrate  12.5 mg Oral BID  . sodium chloride flush  3 mL Intravenous Q12H   Continuous Infusions: . heparin 750 Units/hr (08/21/16 0903)     LOS: 0 days    Time spent: 40 minutes   Dessa Phi, DO [facility] [email or web address] Password [facility] 08/21/2016, 10:35 AM

## 2016-08-21 NOTE — Progress Notes (Signed)
ANTICOAGULATION CONSULT NOTE - Initial Consult  Pharmacy Consult for Warfarin Indication: atrial fibrillation  Assessment: 82 yo female admitted 4/14 for new onset Afib. Pharmacy has been consulted for warfarin recommendations. Per consult, no bridge is necessary and patient is likely to go home today with close INR follow-up. No recent baseline INR, only INR in chart is 1.16 (11/2015). Hemoglobin and platelets are low but stable. No overt bleeding noted.    Goal of Therapy:  INR 2-3 Monitor platelets by anticoagulation protocol: Yes   Plan:  Warfarin 5 mg po daily for 3 days Highly recommend INR within 3 days of discharge While here, monitor INR daily   Allergies  Allergen Reactions  . Tape Other (See Comments)    Pulls off skin  . Sulfa Antibiotics Other (See Comments)    Bumps all over body    Patient Measurements: Height: 5\' 5"  (165.1 cm) Weight: 121 lb 14.6 oz (55.3 kg) IBW/kg (Calculated) : 57  Vital Signs: Temp: 98 F (36.7 C) (04/15 0436) Temp Source: Oral (04/15 0436) BP: 101/62 (04/15 0906) Pulse Rate: 110 (04/15 0906)  Labs:  Recent Labs  08/20/16 1649 08/20/16 2253 08/21/16 0429 08/21/16 0649  HGB 10.6*  --  9.8*  --   HCT 32.1*  --  31.4*  --   PLT 140*  --  122*  --   HEPARINUNFRC  --   --   --  0.26*  CREATININE 2.50*  --  3.53*  --   TROPONINI 0.06* 0.06* 0.06*  --     Estimated Creatinine Clearance: 8.7 mL/min (A) (by C-G formula based on SCr of 3.53 mg/dL (H)).   Medical History: Past Medical History:  Diagnosis Date  . Anemia   . Aortic stenosis   . Benign bladder mass   . Chronic cough   . DJD (degenerative joint disease)   . Dyslipidemia   . ESRD on hemodialysis ([place])   . Gout   . HTN (hypertension)   . Obstructive sleep apnea    mild  . Orthostatic syncope    around year 2006  . Pneumonia   . TIA (transient ischemic attack)     Medications:  Scheduled:  . aspirin EC  81 mg Oral Daily  . atorvastatin  10 mg Oral QHS   . cinacalcet  30 mg Oral Q breakfast  . metoprolol tartrate  12.5 mg Oral BID  . sodium chloride flush  3 mL Intravenous Q12H  . warfarin  5 mg Oral ONCE-1800  . Warfarin - Pharmacist Dosing Inpatient   Does not apply [id number]    Belia Heman, PharmD PGY1 Pharmacy Resident [phone] (Pager) 08/21/2016 11:32 AM

## 2016-08-21 NOTE — Care Management Note (Signed)
Case Management Note  Patient Details  Name: [name] MRN: [id number] Date of Birth: 07/03/1921  Subjective/Objective:          Pt admitted with elevated HR          Action/Plan:   PTA independent form home.  Family are close by and provide support when needed.  Pt is still driving.  CM will continue to follow for discharge needs    Expected Discharge Date:                  Expected Discharge Plan:  Home/Self Care  In-House Referral:     Discharge planning Services  CM Consult  Post Acute Care Choice:    Choice offered to:     DME Arranged:    DME Agency:     HH Arranged:    HH Agency:     Status of Service:  In process, will continue to follow  If discussed at Long Length of Stay Meetings, dates discussed:    Additional Comments:  [name], RN 08/21/2016, 10:17 AM

## 2016-08-21 NOTE — Care Management Obs Status (Signed)
[facility] NOTIFICATION   Patient Details  Name: MARNY SMETHERS MRN: [id number] Date of Birth: 11/22/1921   Medicare Observation Status Notification Given:  Yes    [name], RN 08/21/2016, 10:18 AM

## 2016-08-21 NOTE — Progress Notes (Signed)
   Patient ambulated this afternoon with HR sustaining 130s. Increase beta block dose starting this evening as long as SBP > 90. Will ambulate again in AM and plan for discharge home if HR well controlled.    Dessa Phi, DO [facility] [email or web address] Password [email or web address] 08/21/2016, 1:19 PM

## 2016-08-22 DIAGNOSIS — Z9842 Cataract extraction status, left eye: Secondary | ICD-10-CM | POA: Diagnosis not present

## 2016-08-22 DIAGNOSIS — Z961 Presence of intraocular lens: Secondary | ICD-10-CM | POA: Diagnosis present

## 2016-08-22 DIAGNOSIS — Z8673 Personal history of transient ischemic attack (TIA), and cerebral infarction without residual deficits: Secondary | ICD-10-CM | POA: Diagnosis not present

## 2016-08-22 DIAGNOSIS — Z90710 Acquired absence of both cervix and uterus: Secondary | ICD-10-CM | POA: Diagnosis not present

## 2016-08-22 DIAGNOSIS — I350 Nonrheumatic aortic (valve) stenosis: Secondary | ICD-10-CM | POA: Diagnosis not present

## 2016-08-22 DIAGNOSIS — Z992 Dependence on renal dialysis: Secondary | ICD-10-CM | POA: Diagnosis not present

## 2016-08-22 DIAGNOSIS — G4733 Obstructive sleep apnea (adult) (pediatric): Secondary | ICD-10-CM | POA: Diagnosis present

## 2016-08-22 DIAGNOSIS — I4892 Unspecified atrial flutter: Secondary | ICD-10-CM | POA: Diagnosis present

## 2016-08-22 DIAGNOSIS — N186 End stage renal disease: Secondary | ICD-10-CM | POA: Diagnosis not present

## 2016-08-22 DIAGNOSIS — E785 Hyperlipidemia, unspecified: Secondary | ICD-10-CM | POA: Diagnosis present

## 2016-08-22 DIAGNOSIS — I4510 Unspecified right bundle-branch block: Secondary | ICD-10-CM | POA: Diagnosis present

## 2016-08-22 DIAGNOSIS — Z905 Acquired absence of kidney: Secondary | ICD-10-CM | POA: Diagnosis not present

## 2016-08-22 DIAGNOSIS — I480 Paroxysmal atrial fibrillation: Secondary | ICD-10-CM | POA: Diagnosis present

## 2016-08-22 DIAGNOSIS — I120 Hypertensive chronic kidney disease with stage 5 chronic kidney disease or end stage renal disease: Secondary | ICD-10-CM | POA: Diagnosis present

## 2016-08-22 DIAGNOSIS — Z833 Family history of diabetes mellitus: Secondary | ICD-10-CM | POA: Diagnosis not present

## 2016-08-22 DIAGNOSIS — I248 Other forms of acute ischemic heart disease: Secondary | ICD-10-CM | POA: Diagnosis present

## 2016-08-22 DIAGNOSIS — D631 Anemia in chronic kidney disease: Secondary | ICD-10-CM | POA: Diagnosis present

## 2016-08-22 DIAGNOSIS — Z7901 Long term (current) use of anticoagulants: Secondary | ICD-10-CM | POA: Diagnosis not present

## 2016-08-22 DIAGNOSIS — N189 Chronic kidney disease, unspecified: Secondary | ICD-10-CM | POA: Diagnosis not present

## 2016-08-22 DIAGNOSIS — Z7982 Long term (current) use of aspirin: Secondary | ICD-10-CM | POA: Diagnosis not present

## 2016-08-22 DIAGNOSIS — E8889 Other specified metabolic disorders: Secondary | ICD-10-CM | POA: Diagnosis present

## 2016-08-22 DIAGNOSIS — I4891 Unspecified atrial fibrillation: Secondary | ICD-10-CM | POA: Diagnosis present

## 2016-08-22 DIAGNOSIS — Z8249 Family history of ischemic heart disease and other diseases of the circulatory system: Secondary | ICD-10-CM | POA: Diagnosis not present

## 2016-08-22 DIAGNOSIS — N2581 Secondary hyperparathyroidism of renal origin: Secondary | ICD-10-CM | POA: Diagnosis present

## 2016-08-22 DIAGNOSIS — Z79899 Other long term (current) drug therapy: Secondary | ICD-10-CM | POA: Diagnosis not present

## 2016-08-22 DIAGNOSIS — Z9841 Cataract extraction status, right eye: Secondary | ICD-10-CM | POA: Diagnosis not present

## 2016-08-22 LAB — CBC
HEMATOCRIT: 30.5 % — AB (ref 36.0–46.0)
Hemoglobin: 9.9 g/dL — ABNORMAL LOW (ref 12.0–15.0)
MCH: 32.2 pg (ref 26.0–34.0)
MCHC: 32.5 g/dL (ref 30.0–36.0)
MCV: 99.3 fL (ref 78.0–100.0)
PLATELETS: 112 10*3/uL — AB (ref 150–400)
RBC: 3.07 MIL/uL — ABNORMAL LOW (ref 3.87–5.11)
RDW: 15 % (ref 11.5–15.5)
WBC: 4 10*3/uL (ref 4.0–10.5)

## 2016-08-22 LAB — PROTIME-INR
INR: 1.08
PROTHROMBIN TIME: 14.1 s (ref 11.4–15.2)

## 2016-08-22 LAB — HEMOGLOBIN A1C
Hgb A1c MFr Bld: 4.8 % (ref 4.8–5.6)
Mean Plasma Glucose: 91 mg/dL

## 2016-08-22 MED ORDER — AMIODARONE HCL 200 MG PO TABS
200.0000 mg | ORAL_TABLET | Freq: Two times a day (BID) | ORAL | Status: DC
  Administered 2016-08-22 – 2016-08-25 (×7): 200 mg via ORAL
  Filled 2016-08-22 (×7): qty 1

## 2016-08-22 MED ORDER — DARBEPOETIN ALFA 60 MCG/0.3ML IJ SOSY
60.0000 ug | PREFILLED_SYRINGE | INTRAMUSCULAR | Status: DC
  Administered 2016-08-23: 60 ug via INTRAVENOUS

## 2016-08-22 MED ORDER — DOXERCALCIFEROL 4 MCG/2ML IV SOLN
2.0000 ug | INTRAVENOUS | Status: DC
  Administered 2016-08-23 – 2016-08-25 (×2): 2 ug via INTRAVENOUS
  Filled 2016-08-22: qty 2

## 2016-08-22 MED ORDER — ENOXAPARIN SODIUM 60 MG/0.6ML ~~LOC~~ SOLN
60.0000 mg | SUBCUTANEOUS | Status: DC
  Administered 2016-08-22 – 2016-08-24 (×3): 60 mg via SUBCUTANEOUS
  Filled 2016-08-22 (×3): qty 0.6

## 2016-08-22 MED ORDER — METOPROLOL TARTRATE 12.5 MG HALF TABLET
12.5000 mg | ORAL_TABLET | Freq: Two times a day (BID) | ORAL | Status: DC
  Administered 2016-08-22 – 2016-08-25 (×6): 12.5 mg via ORAL
  Filled 2016-08-22 (×6): qty 1

## 2016-08-22 MED ORDER — WARFARIN SODIUM 5 MG PO TABS
5.0000 mg | ORAL_TABLET | Freq: Once | ORAL | Status: AC
  Administered 2016-08-22: 5 mg via ORAL
  Filled 2016-08-22: qty 1

## 2016-08-22 NOTE — Care Management Note (Addendum)
Case Management Note  Patient Details  Name: [name] MRN: [id number] Date of Birth: [birth year]  Subjective/Objective:   Pt presented for Atrial Fib with RVR- HX HD. Per pt she is independent- still drives to HD. Per pt she manages without any problems. Will not need any HH Needs at this time.                Action/Plan: CM did receive a consult for medication needs. Benefits Check is in process for Lovenox. Pt uses [employer]. Pt states she is able to manage her co pays. No further needs from CM @ this time.   Expected Discharge Date:                  Expected Discharge Plan:  Home/Self Care  In-House Referral:  NA  Discharge planning Services  CM Consult  Post Acute Care Choice:  NA Choice offered to:  NA  DME Arranged:  N/A DME Agency:  NA  HH Arranged:  NA HH Agency:  NA  Status of Service:  Completed, signed off  If discussed at [place] of Stay Meetings, dates discussed:  08-25-16 Additional Comments: 1333 08-25-16 Jacqlyn Krauss, RN,BSN [phone] CM called HD Center to inquire about if they can draw PT/INR labs. Pt will go to [facility] on Monday for appointment -then HD will be able to draw thereafter and send result to cardiology. No further needs from CM at this time.    1108 08-25-16 Jacqlyn Krauss, RN,BSN [phone] Appointment placed on AVS for coumadin clinic appointment.    1533 08-24-16 Jacqlyn Krauss, RN,BSN [phone] CM did speak with pt in regards to ability to have family assist with Lovenox Injections in the home. Pt states she will not have any family that can assist. Awaiting therapeutic INR at this time-hopefully will be able to d/c post HD 08-25-16.    08-23-16 1223 Jacqlyn Krauss, RN,BSN [phone] Benefits Check Completed:   S/W [name] @ [facility] # [phone]   1. LOVENOX 60 MG 7 SYRINGES  COVER- YES  CO-PAY- $ 47.00  TIER- 6 DRUG  PRIOR APPROVAL- NO   2. ENOXAPARIN 60 MG  7  SYRINGES  COVER- YES  CO-PAY- $ 47.00  TIER- 6 DRUG  PRIOR APPROVAL- NO   PHARMACY : [facility]  Barbara Roys, RN 08/22/2016, 1:02 PM

## 2016-08-22 NOTE — Progress Notes (Addendum)
ANTICOAGULATION CONSULT NOTE - Follow Up Consult  Pharmacy Consult for Coumadin + Lovenox Indication: atrial fibrillation  Allergies  Allergen Reactions  . Tape Other (See Comments)    Pulls off skin  . Sulfa Antibiotics Other (See Comments)    Bumps all over body    Patient Measurements: Height: 5\' 5"  (165.1 cm) Weight: 123 lb 11.2 oz (56.1 kg) IBW/kg (Calculated) : 57   Vital Signs: Temp: 98.6 F (37 C) (04/16 0346) Temp Source: Oral (04/16 0346) BP: 91/44 (04/16 0348) Pulse Rate: 74 (04/16 0346)  Labs:  Recent Labs  08/20/16 1649 08/20/16 2253 08/21/16 0429 08/21/16 0649 08/22/16 0342  HGB 10.6*  --  9.8*  --  9.9*  HCT 32.1*  --  31.4*  --  30.5*  PLT 140*  --  122*  --  112*  LABPROT  --   --   --   --  14.1  INR  --   --   --   --  1.08  HEPARINUNFRC  --   --   --  0.26*  --   CREATININE 2.50*  --  3.53*  --   --   TROPONINI 0.06* 0.06* 0.06*  --   --     Estimated Creatinine Clearance: 8.8 mL/min (A) (by C-G formula based on SCr of 3.53 mg/dL (H)).  Assessment: Anticoag: Hep for new onset AFib, now off. 4/15 new start warfarin. INR 1.08. Hgb 9.9 stable. Plts only 112. Add Lovenox for bridging.  Goal of Therapy:  INR 2-3 Monitor platelets by anticoagulation protocol: Yes   Plan:  Warfarin 5 mg x 1  Lovenox 60mg  SQ q24h Daily INR Monitor for bleeding Possibly home today   Trenton Verne S. Alford Highland, PharmD, BCPS Clinical Staff Pharmacist Pager [phone]  [name] 08/22/2016,8:51 AM

## 2016-08-22 NOTE — Consult Note (Signed)
[facility] Renal Consultation Note  Indication for Consultation:  Management of ESRD/hemodialysis; anemia, hypertension/volume and secondary hyperparathyroidism  HPI: [name] is a 83 y.o. 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 Fib with RVR.She was sent from her op kidney center secondary to recurrent Tachycardia. She  was asymptomatic with rates in 140s pre HD and would decrease  to 80s .She has a history of TIA in past very active for 83 yo ,lives alone and drives herself to HD. She is followed by Dr. Percival Spanish  Last ov 01/2016  Stable  with ho severe AS. Admit CXR with no  active cardiopulmonary disease,  EKG shows atrial fibrillation, HR 126, hgb 10.6 , k 3.9 ,and neg cardiac enzymes.  We are consulted for HD / ESRD issues .       Past Medical History:  Diagnosis Date  . Anemia   . Aortic stenosis   . Benign bladder mass   . Chronic cough   . DJD (degenerative joint disease)   . Dyslipidemia   . ESRD on hemodialysis ([place])   . Gout   . HTN (hypertension)   . Obstructive sleep apnea    mild  . Orthostatic syncope    around year 2006  . Pneumonia   . TIA (transient ischemic attack)     Past Surgical History:  Procedure Laterality Date  . BONE MARROW BIOPSY    . CATARACT EXTRACTION W/ INTRAOCULAR LENS  IMPLANT, BILATERAL    . COLONOSCOPY W/ BIOPSIES AND POLYPECTOMY    . left arm dialysis graft    . NEPHRECTOMY     left  . REVISION OF ARTERIOVENOUS GORETEX GRAFT Right 11/24/2014   Procedure: REPLACEMENT OF  MEDIAL SIDE OF RIGHT FORARM ARTERIOVENOUS GORETEX GRAFT;  Surgeon: Conrad Templeton, MD;  Location: [facility];  Service: Vascular;  Laterality: Right;  . REVISION OF ARTERIOVENOUS GORETEX GRAFT Right 11/28/2015   Procedure: REVISION OF RIGHT ARM ARTERIOVENOUS GORETEX GRAFT;  Surgeon: Serafina Mitchell, MD;  Location: [facility];  Service: Vascular;  Laterality: Right;  . TOTAL ABDOMINAL HYSTERECTOMY        Family History  Problem Relation Age of Onset  . Hypertension Mother   . Diabetes Mother   . Hypertension Other       reports that she has never smoked. She has never used smokeless tobacco. She reports that she does not drink alcohol or use drugs.   Allergies  Allergen Reactions  . Tape Other (See Comments)    Pulls off skin  . Sulfa Antibiotics Other (See Comments)    Bumps all over body    Prior to Admission medications   Medication Sig Start Date End Date Taking? Authorizing Provider  acetaminophen (TYLENOL) 500 MG tablet Take 1,000 mg by mouth every 6 (six) hours as needed (pain).    Yes Historical Provider, MD  aspirin 81 MG tablet Take 81 mg by mouth daily.     Yes Historical Provider, MD  atorvastatin (LIPITOR) 10 MG tablet Take 10 mg by mouth at bedtime.     Yes Historical Provider, MD  multivitamin (RENA-VIT) TABS tablet Take 1 tablet by mouth daily.   Yes Historical Provider, MD  SENSIPAR 30 MG tablet Take 30 mg by mouth daily.  02/14/13  Yes Historical Provider, MD  Results for orders placed or performed during the hospital encounter of 08/20/16 (from the past 48 hour(s))  Basic metabolic panel     Status: Abnormal   Collection Time: 08/20/16  4:49 PM  Result Value Ref Range   Sodium 138 135 - 145 mmol/L   Potassium 3.9 3.5 - 5.1 mmol/L   Chloride 96 (L) 101 - 111 mmol/L   CO2 29 22 - 32 mmol/L   Glucose, Bld 90 65 - 99 mg/dL   BUN 9 6 - 20 mg/dL   Creatinine, Ser 2.50 (H) 0.44 - 1.00 mg/dL   Calcium 8.6 (L) 8.9 - 10.3 mg/dL   GFR calc non Af Amer 16 (L) >60 mL/min   GFR calc Af Amer 18 (L) >60 mL/min    Comment: (NOTE) The eGFR has been calculated using the CKD EPI equation. This calculation has not been validated in all clinical situations. eGFR's persistently <60 mL/min signify possible Chronic Kidney Disease.    Anion gap 13 5 - 15  CBC  with Differential     Status: Abnormal   Collection Time: 08/20/16  4:49 PM  Result Value Ref Range   WBC 4.2 4.0 - 10.5 K/uL   RBC 3.26 (L) 3.87 - 5.11 MIL/uL   Hemoglobin 10.6 (L) 12.0 - 15.0 g/dL   HCT 32.1 (L) 36.0 - 46.0 %   MCV 98.5 78.0 - 100.0 fL   MCH 32.5 26.0 - 34.0 pg   MCHC 33.0 30.0 - 36.0 g/dL   RDW 15.2 11.5 - 15.5 %   Platelets 140 (L) 150 - 400 K/uL   Neutrophils Relative % 44 %   Neutro Abs 1.8 1.7 - 7.7 K/uL   Lymphocytes Relative 46 %   Lymphs Abs 2.0 0.7 - 4.0 K/uL   Monocytes Relative 6 %   Monocytes Absolute 0.3 0.1 - 1.0 K/uL   Eosinophils Relative 3 %   Eosinophils Absolute 0.1 0.0 - 0.7 K/uL   Basophils Relative 1 %   Basophils Absolute 0.0 0.0 - 0.1 K/uL  Troponin I     Status: Abnormal   Collection Time: 08/20/16  4:49 PM  Result Value Ref Range   Troponin I 0.06 (HH) <0.03 ng/mL    Comment: CRITICAL RESULT CALLED TO, READ BACK BY AND VERIFIED WITH: RUTH RICHARDS RN AT 1803 08/20/16 BY WOOLLENK   TSH     Status: None   Collection Time: 08/20/16  7:15 PM  Result Value Ref Range   TSH 2.400 0.350 - 4.500 uIU/mL    Comment: Performed by a 3rd Generation assay with a functional sensitivity of <=0.01 uIU/mL.  Troponin I     Status: Abnormal   Collection Time: 08/20/16 10:53 PM  Result Value Ref Range   Troponin I 0.06 (HH) <0.03 ng/mL    Comment: CRITICAL VALUE NOTED.  VALUE IS CONSISTENT WITH PREVIOUSLY REPORTED AND CALLED VALUE.  CBC     Status: Abnormal   Collection Time: 08/21/16  4:29 AM  Result Value Ref Range   WBC 3.7 (L) 4.0 - 10.5 K/uL   RBC 3.15 (L) 3.87 - 5.11 MIL/uL   Hemoglobin 9.8 (L) 12.0 - 15.0 g/dL   HCT 31.4 (L) 36.0 - 46.0 %   MCV 99.7 78.0 - 100.0 fL   MCH 31.1 26.0 - 34.0 pg   MCHC 31.2 30.0 - 36.0 g/dL   RDW 15.0 11.5 - 15.5 %   Platelets 122 (L) 150 - 400 K/uL  Troponin I     Status:  Abnormal   Collection Time: 08/21/16  4:29 AM  Result Value Ref Range   Troponin I 0.06 (HH) <0.03 ng/mL    Comment: CRITICAL VALUE  NOTED.  VALUE IS CONSISTENT WITH PREVIOUSLY REPORTED AND CALLED VALUE.  Hemoglobin A1c     Status: None   Collection Time: 08/21/16  4:29 AM  Result Value Ref Range   Hgb A1c MFr Bld 4.8 4.8 - 5.6 %    Comment: (NOTE)         Pre-diabetes: 5.7 - 6.4         Diabetes: >6.4         Glycemic control for adults with diabetes: <7.0    Mean Plasma Glucose 91 mg/dL    Comment: (NOTE) Performed At: [facility] [place], [place] [id number] Lindon Romp MD ML:[id number]   Basic metabolic panel     Status: Abnormal   Collection Time: 08/21/16  4:29 AM  Result Value Ref Range   Sodium 137 135 - 145 mmol/L   Potassium 3.9 3.5 - 5.1 mmol/L   Chloride 98 (L) 101 - 111 mmol/L   CO2 30 22 - 32 mmol/L   Glucose, Bld 89 65 - 99 mg/dL   BUN 18 6 - 20 mg/dL   Creatinine, Ser 3.53 (H) 0.44 - 1.00 mg/dL    Comment: DELTA CHECK NOTED   Calcium 8.4 (L) 8.9 - 10.3 mg/dL   GFR calc non Af Amer 10 (L) >60 mL/min   GFR calc Af Amer 12 (L) >60 mL/min    Comment: (NOTE) The eGFR has been calculated using the CKD EPI equation. This calculation has not been validated in all clinical situations. eGFR's persistently <60 mL/min signify possible Chronic Kidney Disease.    Anion gap 9 5 - 15  Lipid panel     Status: None   Collection Time: 08/21/16  4:29 AM  Result Value Ref Range   Cholesterol 143 0 - 200 mg/dL   Triglycerides 62 <150 mg/dL   HDL 68 >40 mg/dL   Total CHOL/HDL Ratio 2.1 RATIO   VLDL 12 0 - 40 mg/dL   LDL Cholesterol 63 0 - 99 mg/dL    Comment:        Total Cholesterol/HDL:CHD Risk Coronary Heart Disease Risk Table                     Men   Women  1/2 Average Risk   3.4   3.3  Average Risk       5.0   4.4  2 X Average Risk   9.6   7.1  3 X Average Risk  23.4   11.0        Use the calculated Patient Ratio above and the CHD Risk Table to determine the patient's CHD Risk.        ATP III CLASSIFICATION (LDL):  <100     mg/dL   Optimal  100-129  mg/dL    Near or Above                    Optimal  130-159  mg/dL   Borderline  160-189  mg/dL   High  >190     mg/dL   Very High   Heparin level (unfractionated)     Status: Abnormal   Collection Time: 08/21/16  6:49 AM  Result Value Ref Range   Heparin Unfractionated 0.26 (L) 0.30 - 0.70 IU/mL    Comment:  IF HEPARIN RESULTS ARE BELOW EXPECTED VALUES, AND PATIENT DOSAGE HAS BEEN CONFIRMED, SUGGEST FOLLOW UP TESTING OF ANTITHROMBIN III LEVELS.   CBC     Status: Abnormal   Collection Time: 08/22/16  3:42 AM  Result Value Ref Range   WBC 4.0 4.0 - 10.5 K/uL   RBC 3.07 (L) 3.87 - 5.11 MIL/uL   Hemoglobin 9.9 (L) 12.0 - 15.0 g/dL   HCT 30.5 (L) 36.0 - 46.0 %   MCV 99.3 78.0 - 100.0 fL   MCH 32.2 26.0 - 34.0 pg   MCHC 32.5 30.0 - 36.0 g/dL   RDW 15.0 11.5 - 15.5 %   Platelets 112 (L) 150 - 400 K/uL    Comment: CONSISTENT WITH PREVIOUS RESULT  Protime-INR     Status: None   Collection Time: 08/22/16  3:42 AM  Result Value Ref Range   Prothrombin Time 14.1 11.4 - 15.2 seconds   INR 1.08      ROS: Denies fevers, chills, cough, dizziness, chest pain , or gi pain or symptoms,denies  SOB at rest or exertion .  Physical Exam: Vitals:   08/22/16 0346 08/22/16 0348  BP: (!) 88/40 (!) 91/44  Pulse: 74   Resp:    Temp: 98.6 F (37 C)      General: Pleasant, alert elderly AA Female appearing in NAD  Neuro: Alert and oriented X 3. Moves all extremities spontaneously. HEENT: [place] , EOMI           Neck: Supple without bruits or JVD. Lungs:  Resp  unlabored, CTA without wheezing or rales. Heart: Irregularly irregular, rate  80-90s when seen /no s3, s4, 3/6 SEM along RUSB./ No Rub  Abdomen: Soft, non-tender, non-distended, BS + and normal   Skin: no overt rash warm  Dry  Neuro: alert OX3 , no acute deficits for her noted  Dialysis Access: Pos. Bruit R FA AVG Extremities: No clubbing, cyanosis or  Pedal edema. DP/PT/Radials 2+ and equal bilaterall  Dialysis Orders: Center: [facility]  on  TTS . EDW 55.5 (past 2 tx post wts 55.0, 54.7) HD Bath 2k, 2.25ca  Time 3hr 94mn. Heparin 4000. Access RFA AVG     Hectorol 2 mcg IV/HD / Mircera 30 q 4 weeks  (last given 08/02/16)  Last op hgb 10.9 08/18/16    Other op labs pth 375  Assessment/Plan 1. New Afib- per admit and Card consulting - on Amiodarone / BB low dose in 83yo (monitor bp closely on HD )and Coumadin started with Heparin bridge  ) 2. ESRD -  TTS Hd schedule - no hd needs today 3. Hypertension/volume  - slightly lowish bp for her 91/44 / on Lopressor 12,5 mg bid (need to be carefull with Hypotension with this 83yo HD pt no excess vol with cxr and exam / .5 kg >edw now  4. Anemia  - hgb 9.9 will inform kid center change to q 2 weeks (was q4weeks ESA)  ESA with her new A fib  And dropping hgb/ Aranesp give q Tuesday hd  5. Metabolic bone disease -  Ca 8.4 on  Hec. 249m q hd and sensipar 3012m day , no binders last op phos 4.4  6. HO severe AS- currently asymp.  DavErnest HaberA-C [facility] Beeper [phone]/2018, 12:46 PM   RobKelly Splinter [facility] ([phone]/2018, 4:13 PM

## 2016-08-22 NOTE — Progress Notes (Addendum)
Progress Note  Patient Name: [name] Date of Encounter: 08/22/2016  Primary Cardiologist: Dr Percival Spanish  Subjective   No chest pain or dyspnea; no palpitations.  Inpatient Medications    Scheduled Meds: . aspirin EC  81 mg Oral Daily  . atorvastatin  10 mg Oral QHS  . cinacalcet  30 mg Oral Q breakfast  . metoprolol tartrate  25 mg Oral BID  . sodium chloride flush  3 mL Intravenous Q12H  . Warfarin - Pharmacist Dosing Inpatient   Does not apply q1800   Continuous Infusions:  PRN Meds: sodium chloride, acetaminophen, ondansetron (ZOFRAN) IV, sodium chloride flush   Vital Signs    Vitals:   08/21/16 2055 08/22/16 0200 08/22/16 0346 08/22/16 0348  BP: 98/73  (!) 88/40 (!) 91/44  Pulse: (!) 118 76 74   Resp:      Temp: 98.9 F (37.2 C)  98.6 F (37 C)   TempSrc: Oral  Oral   SpO2: 100%  97%   Weight:   123 lb 11.2 oz (56.1 kg)   Height:        Intake/Output Summary (Last 24 hours) at 08/22/16 0845 Last data filed at 08/21/16 2214  Gross per 24 hour  Intake              243 ml  Output                0 ml  Net              243 ml   Filed Weights   08/20/16 2224 08/21/16 0436 08/22/16 0346  Weight: 122 lb 2.2 oz (55.4 kg) 121 lb 14.6 oz (55.3 kg) 123 lb 11.2 oz (56.1 kg)    Telemetry    Atrial flutter, HR elevated with ambulation - Personally Reviewed   Physical Exam   GEN: No acute distress.   Neck: No JVD Cardiac: irregular, 3/6 systolic murmur LSB Respiratory: Clear to auscultation bilaterally. GI: Soft, nontender, non-distended  MS: No edema; No deformity. Neuro:  Nonfocal  Psych: Normal affect   Labs    Chemistry Recent Labs Lab 08/20/16 1649 08/21/16 0429  NA 138 137  K 3.9 3.9  CL 96* 98*  CO2 29 30  GLUCOSE 90 89  BUN 9 18  CREATININE 2.50* 3.53*  CALCIUM 8.6* 8.4*  GFRNONAA 16* 10*  GFRAA 18* 12*  ANIONGAP 13 9     Hematology Recent Labs Lab 08/20/16 1649 08/21/16 0429 08/22/16 0342  WBC 4.2 3.7* 4.0  RBC  3.26* 3.15* 3.07*  HGB 10.6* 9.8* 9.9*  HCT 32.1* 31.4* 30.5*  MCV 98.5 99.7 99.3  MCH 32.5 31.1 32.2  MCHC 33.0 31.2 32.5  RDW 15.2 15.0 15.0  PLT 140* 122* 112*    Cardiac Enzymes Recent Labs Lab 08/20/16 1649 08/20/16 2253 08/21/16 0429  TROPONINI 0.06* 0.06* 0.06*    Radiology    Dg Chest Portable 1 View  Result Date: 08/20/2016 CLINICAL DATA:  Atrial fibrillation. EXAM: PORTABLE CHEST 1 VIEW COMPARISON:  11/27/2015 FINDINGS: Cardiomegaly noted. There is no evidence of focal airspace disease, pulmonary edema, suspicious pulmonary nodule/mass, pleural effusion, or pneumothorax. No acute bony abnormalities are identified. IMPRESSION: Cardiomegaly without evidence of acute cardiopulmonary disease. Electronically Signed   By: Margarette Canada M.D.   On: 08/20/2016 17:42   Patient Profile     83 y.o. female with past medical history of severe aortic stenosis admitted with new onset atrial flutter.  Assessment & Plan  1 persistent atrial flutter- heart rate remains elevated. Blood pressure is borderline and she is a dialysis patient. I therefore do not think we can advance metoprolol. Add amiodarone 200 mg twice a day for 2 weeks and then 200 mg daily thereafter. Note she does have a history of TIA. Would add Lovenox until INR greater than 2. She is not a candidate for DOAC given ESRD. Note TSH normal. DC ASA given need for coumadin.  2 history of severe aortic stenosis-patient will need follow-up echocardiogram in July. She is not having symptoms at present. She may be a candidate for TAVR in the future if needed.  3 end-stage renal disease-would notify nephrology that patient is in-house. She will need dialysis tomorrow.  4 Hyperlipidemia-continue statin.   Signed, Kirk Ruths, MD  08/22/2016, 8:45 AM

## 2016-08-22 NOTE — Progress Notes (Signed)
PROGRESS NOTE    [name]  NWG:[id number] DOB: 1921/09/17 DOA: 08/20/2016 PCP: Criselda Peaches, MD     Brief Narrative:  [name] is a 83 y.o. female with past medical history significant of severe aortic stenosis, ESRD on HD Tuesday, Thursday Saturday, chronic anemia, benign bladder mass, chronic cough, dyslipidemia, HTN, obstructive sleep apnea, and history of TIA who presented with tachycardia. She states that in the past week at dialysis sessions, they have told her that her heart rate was very elevated. Initially, they thought they took too much fluid, however, she continued to be tachycardic throughout the week. She was sent from dialysis to emergency department and EKG revealed new onset atrial fibrillation with RVR. She remains asymptomatic. Denies any chest pain, shortness of breath or feeling heart palpitations. She is a very functional 83 year old, lives alone and still driving.  Assessment & Plan:   Active Problems:   ESRD (end stage renal disease) ([place])   Secondary hyperparathyroidism ([facility])   Atrial fibrillation with RVR ([facility])   Aortic stenosis   New onset a-fib ([place])  New onset atrial fibrillation -CHADSVASc 6 -Lovenox/Coumadin  -Appreciate cardiology  -Lopressor, limited by low BP. Amiodarone started   ESRD on dialysis T/Th/Sat -Last HD Saturday 4/14 -Nephrology aware   HLD -Lipitor   Chronic anemia of chronic disease -Hgb stable    DVT prophylaxis: lovenox/coumadin  Code Status: full Family Communication: no family at bedside Disposition Plan: pending improvement    Consultants:   Cardiology  Nephrology   Procedures:   None  Antimicrobials:   None    Subjective: Patient feeling well this morning. No complaints, no chest pain or SOB. Per RN, patient's HR maintained in the 130s after getting washed up this morning.   Objective: Vitals:   08/21/16 2055 08/22/16 0200 08/22/16 0346 08/22/16 0348  BP: 98/73  (!) 88/40 (!)  91/44  Pulse: (!) 118 76 74   Resp:      Temp: 98.9 F (37.2 C)  98.6 F (37 C)   TempSrc: Oral  Oral   SpO2: 100%  97%   Weight:   56.1 kg (123 lb 11.2 oz)   Height:        Intake/Output Summary (Last 24 hours) at 08/22/16 1004 Last data filed at 08/21/16 2214  Gross per 24 hour  Intake              123 ml  Output                0 ml  Net              123 ml   Filed Weights   08/20/16 2224 08/21/16 0436 08/22/16 0346  Weight: 55.4 kg (122 lb 2.2 oz) 55.3 kg (121 lb 14.6 oz) 56.1 kg (123 lb 11.2 oz)    Examination:  General exam: Appears calm and comfortable  Respiratory system: Clear to auscultation. Respiratory effort normal. Cardiovascular system: S1 & S2 heard, Irreg rhythm, rate 57Q, +systolic murmur Gastrointestinal system: Abdomen is nondistended, soft and nontender. No organomegaly or masses felt. Normal bowel sounds heard. Central nervous system: Alert and oriented. No focal neurological deficits. Extremities: Symmetric 5 x 5 power. Skin: No rashes, lesions or ulcers Psychiatry: Judgement and insight appear normal. Mood & affect appropriate.   Data Reviewed: I have personally reviewed following labs and imaging studies  CBC:  Recent Labs Lab 08/20/16 1649 08/21/16 0429 08/22/16 0342  WBC 4.2 3.7* 4.0  NEUTROABS 1.8  --   --  HGB 10.6* 9.8* 9.9*  HCT 32.1* 31.4* 30.5*  MCV 98.5 99.7 99.3  PLT 140* 122* 295*   Basic Metabolic Panel:  Recent Labs Lab 08/20/16 1649 08/21/16 0429  NA 138 137  K 3.9 3.9  CL 96* 98*  CO2 29 30  GLUCOSE 90 89  BUN 9 18  CREATININE 2.50* 3.53*  CALCIUM 8.6* 8.4*   GFR: Estimated Creatinine Clearance: 8.8 mL/min (A) (by C-G formula based on SCr of 3.53 mg/dL (H)). Liver Function Tests: No results for input(s): AST, ALT, ALKPHOS, BILITOT, PROT, ALBUMIN in the last 168 hours. No results for input(s): LIPASE, AMYLASE in the last 168 hours. No results for input(s): AMMONIA in the last 168 hours. Coagulation  Profile:  Recent Labs Lab 08/22/16 0342  INR 1.08   Cardiac Enzymes:  Recent Labs Lab 08/20/16 1649 08/20/16 2253 08/21/16 0429  TROPONINI 0.06* 0.06* 0.06*   BNP (last 3 results) No results for input(s): PROBNP in the last 8760 hours. HbA1C:  Recent Labs  08/21/16 0429  HGBA1C 4.8   CBG: No results for input(s): GLUCAP in the last 168 hours. Lipid Profile:  Recent Labs  08/21/16 0429  CHOL 143  HDL 68  LDLCALC 63  TRIG 62  CHOLHDL 2.1   Thyroid Function Tests:  Recent Labs  08/20/16 1915  TSH 2.400   Anemia Panel: No results for input(s): VITAMINB12, FOLATE, FERRITIN, TIBC, IRON, RETICCTPCT in the last 72 hours. Sepsis Labs: No results for input(s): PROCALCITON, LATICACIDVEN in the last 168 hours.  No results found for this or any previous visit (from the past 240 hour(s)).     Radiology Studies: Dg Chest Portable 1 View  Result Date: 08/20/2016 CLINICAL DATA:  Atrial fibrillation. EXAM: PORTABLE CHEST 1 VIEW COMPARISON:  11/27/2015 FINDINGS: Cardiomegaly noted. There is no evidence of focal airspace disease, pulmonary edema, suspicious pulmonary nodule/mass, pleural effusion, or pneumothorax. No acute bony abnormalities are identified. IMPRESSION: Cardiomegaly without evidence of acute cardiopulmonary disease. Electronically Signed   By: Margarette Canada M.D.   On: 08/20/2016 17:42      Scheduled Meds: . amiodarone  200 mg Oral BID  . atorvastatin  10 mg Oral QHS  . cinacalcet  30 mg Oral Q breakfast  . enoxaparin (LOVENOX) injection  60 mg Subcutaneous Q24H  . metoprolol tartrate  25 mg Oral BID  . sodium chloride flush  3 mL Intravenous Q12H  . warfarin  5 mg Oral ONCE-1800  . Warfarin - Pharmacist Dosing Inpatient   Does not apply q1800   Continuous Infusions:    LOS: 0 days    Time spent: 30 minutes   Dessa Phi, DO [facility] [email or web address] Password [email or web address] 08/22/2016, 10:04 AM

## 2016-08-23 LAB — PROTIME-INR
INR: 1.31
PROTHROMBIN TIME: 16.3 s — AB (ref 11.4–15.2)

## 2016-08-23 LAB — BASIC METABOLIC PANEL
Anion gap: 15 (ref 5–15)
BUN: 47 mg/dL — AB (ref 6–20)
CALCIUM: 8.1 mg/dL — AB (ref 8.9–10.3)
CO2: 25 mmol/L (ref 22–32)
CREATININE: 6.63 mg/dL — AB (ref 0.44–1.00)
Chloride: 94 mmol/L — ABNORMAL LOW (ref 101–111)
GFR calc Af Amer: 6 mL/min — ABNORMAL LOW (ref >60)
GFR, EST NON AFRICAN AMERICAN: 5 mL/min — AB (ref >60)
GLUCOSE: 87 mg/dL (ref 65–99)
Potassium: 5 mmol/L (ref 3.5–5.1)
Sodium: 134 mmol/L — ABNORMAL LOW (ref 135–145)

## 2016-08-23 LAB — CBC
HCT: 29.7 % — ABNORMAL LOW (ref 36.0–46.0)
Hemoglobin: 9.9 g/dL — ABNORMAL LOW (ref 12.0–15.0)
MCH: 32.6 pg (ref 26.0–34.0)
MCHC: 33.3 g/dL (ref 30.0–36.0)
MCV: 97.7 fL (ref 78.0–100.0)
PLATELETS: 110 10*3/uL — AB (ref 150–400)
RBC: 3.04 MIL/uL — ABNORMAL LOW (ref 3.87–5.11)
RDW: 14.9 % (ref 11.5–15.5)
WBC: 4.3 10*3/uL (ref 4.0–10.5)

## 2016-08-23 MED ORDER — DARBEPOETIN ALFA 60 MCG/0.3ML IJ SOSY
PREFILLED_SYRINGE | INTRAMUSCULAR | Status: AC
  Administered 2016-08-23: 60 ug via INTRAVENOUS
  Filled 2016-08-23: qty 0.3

## 2016-08-23 MED ORDER — DOXERCALCIFEROL 4 MCG/2ML IV SOLN
INTRAVENOUS | Status: AC
  Filled 2016-08-23: qty 2

## 2016-08-23 MED ORDER — WARFARIN SODIUM 2 MG PO TABS
3.0000 mg | ORAL_TABLET | Freq: Once | ORAL | Status: AC
  Administered 2016-08-23: 18:00:00 3 mg via ORAL
  Filled 2016-08-23: qty 1

## 2016-08-23 NOTE — Progress Notes (Signed)
ANTICOAGULATION CONSULT NOTE - Follow Up Consult  Pharmacy Consult for Coumadin + Lovenox Indication: atrial fibrillation  Allergies  Allergen Reactions  . Tape Other (See Comments)    Pulls off skin  . Sulfa Antibiotics Other (See Comments)    Bumps all over body    Patient Measurements: Height: 5\' 5"  (165.1 cm) Weight: 122 lb 5.7 oz (55.5 kg) (stood to scale ) IBW/kg (Calculated) : 57   Vital Signs: Temp: 97.4 F (36.3 C) (04/17 1044) Temp Source: Oral (04/17 1044) BP: 106/36 (04/17 1044) Pulse Rate: 59 (04/17 1044)  Labs:  Recent Labs  08/20/16 1649 08/20/16 2253 08/21/16 0429 08/21/16 0649 08/22/16 0342 08/23/16 0334  HGB 10.6*  --  9.8*  --  9.9* 9.9*  HCT 32.1*  --  31.4*  --  30.5* 29.7*  PLT 140*  --  122*  --  112* 110*  LABPROT  --   --   --   --  14.1 16.3*  INR  --   --   --   --  1.08 1.31  HEPARINUNFRC  --   --   --  0.26*  --   --   CREATININE 2.50*  --  3.53*  --   --  6.63*  TROPONINI 0.06* 0.06* 0.06*  --   --   --     Estimated Creatinine Clearance: 4.6 mL/min (A) (by C-G formula based on SCr of 6.63 mg/dL (H)).  Assessment: Anticoag: Hep for new onset AFib, now off. 4/15 new start warfarin. INR 1.08>1.31. Hgb 9.9 stable. Plts only 110. Now add Lovenox for bridging.  Goal of Therapy:  INR 2-3 Monitor platelets by anticoagulation protocol: Yes   Plan:  Warfarin 3 mg x 1  Lovenox 60mg  SQ q24h Daily INR Monitor for bleeding  [name] S. Alford Highland, PharmD, BCPS Clinical Staff Pharmacist Pager [phone]  [name] 08/23/2016,11:30 AM

## 2016-08-23 NOTE — Progress Notes (Addendum)
Progress Note  Patient Name: [name] Date of Encounter: 08/23/2016  Primary Cardiologist: Dr Percival Spanish  Subjective   No chest pain or dyspnea; no palpitations; no dizziness  Inpatient Medications    Scheduled Meds: . amiodarone  200 mg Oral BID  . atorvastatin  10 mg Oral QHS  . cinacalcet  30 mg Oral Q breakfast  . darbepoetin (ARANESP) injection - DIALYSIS  60 mcg Intravenous Q Tue-HD  . doxercalciferol  2 mcg Intravenous Q T,Th,Sa-HD  . enoxaparin (LOVENOX) injection  60 mg Subcutaneous Q24H  . metoprolol tartrate  12.5 mg Oral BID  . sodium chloride flush  3 mL Intravenous Q12H  . Warfarin - Pharmacist Dosing Inpatient   Does not apply q1800   Continuous Infusions: . sodium chloride     PRN Meds: sodium chloride, acetaminophen, ondansetron (ZOFRAN) IV, sodium chloride flush   Vital Signs    Vitals:   08/23/16 0300 08/23/16 0700 08/23/16 0713 08/23/16 0730  BP: (!) 105/45 (!) 102/39 (!) 104/35 (!) 104/45  Pulse: 77 79 81 87  Resp:  17    Temp: 98.2 F (36.8 C) 97.7 F (36.5 C)    TempSrc: Oral Oral    SpO2: 97% 100%    Weight: 125 lb 1.6 oz (56.7 kg) 125 lb 3.5 oz (56.8 kg)    Height:        Intake/Output Summary (Last 24 hours) at 08/23/16 0956 Last data filed at 08/22/16 2152  Gross per 24 hour  Intake              180 ml  Output                0 ml  Net              180 ml   Filed Weights   08/22/16 0346 08/23/16 0300 08/23/16 0700  Weight: 123 lb 11.2 oz (56.1 kg) 125 lb 1.6 oz (56.7 kg) 125 lb 3.5 oz (56.8 kg)    Telemetry    Atrial flutter, HR improving - Personally Reviewed   Physical Exam   GEN: No acute distress.   Neck: supple Cardiac: irregular, 3/6 systolic murmur LSB Respiratory: Clear to auscultation bilaterally. GI: Soft, nontender, non-distended, + BS MS: No edema Neuro:  Nonfocal  Psych: Normal affect   Labs    Chemistry  Recent Labs Lab 08/20/16 1649 08/21/16 0429 08/23/16 0334  NA 138 137 134*  K 3.9  3.9 5.0  CL 96* 98* 94*  CO2 29 30 25   GLUCOSE 90 89 87  BUN 9 18 47*  CREATININE 2.50* 3.53* 6.63*  CALCIUM 8.6* 8.4* 8.1*  GFRNONAA 16* 10* 5*  GFRAA 18* 12* 6*  ANIONGAP 13 9 15      Hematology  Recent Labs Lab 08/21/16 0429 08/22/16 0342 08/23/16 0334  WBC 3.7* 4.0 4.3  RBC 3.15* 3.07* 3.04*  HGB 9.8* 9.9* 9.9*  HCT 31.4* 30.5* 29.7*  MCV 99.7 99.3 97.7  MCH 31.1 32.2 32.6  MCHC 31.2 32.5 33.3  RDW 15.0 15.0 14.9  PLT 122* 112* 110*    Cardiac Enzymes  Recent Labs Lab 08/20/16 1649 08/20/16 2253 08/21/16 0429  TROPONINI 0.06* 0.06* 0.06*    Patient Profile     83 y.o. female with past medical history of severe aortic stenosis admitted with new onset atrial flutter.  Assessment & Plan    1 persistent atrial flutter- heart rate improving. Continue amiodarone 200 mg twice a day for 2 weeks  and then 200 mg daily thereafter. Note she does have a history of TIA. Would continue Lovenox until INR greater than 2. Follow INR closely given age, small size and potential amiodarone interaction. She is not a candidate for DOAC given ESRD. Note TSH normal.   2 history of severe aortic stenosis-patient will need follow-up echocardiogram in July. She is not having symptoms at present. She may be a candidate for TAVR in the future if needed; she is active and able to care for herself.  3 end-stage renal disease-Dialysis per nephrology  4 Hyperlipidemia-continue statin.   Signed, Kirk Ruths, MD  08/23/2016, 9:56 AM

## 2016-08-23 NOTE — Progress Notes (Signed)
  [facility] Progress Note   Subjective: doing well  Vitals:   08/23/16 1030 08/23/16 1044 08/23/16 1129 08/23/16 1403  BP: (!) 111/48 (!) 106/36 (!) 110/54 (!) 112/43  Pulse: (!) 59 (!) 59 97 83  Resp:  (!) 24  17  Temp:  97.4 F (36.3 C)  98.2 F (36.8 C)  TempSrc:  Oral  Oral  SpO2:  100%  97%  Weight:  55.5 kg (122 lb 5.7 oz)    Height:        Inpatient medications: . amiodarone  200 mg Oral BID  . atorvastatin  10 mg Oral QHS  . cinacalcet  30 mg Oral Q breakfast  . darbepoetin (ARANESP) injection - DIALYSIS  60 mcg Intravenous Q Tue-HD  . doxercalciferol  2 mcg Intravenous Q T,Th,Sa-HD  . enoxaparin (LOVENOX) injection  60 mg Subcutaneous Q24H  . metoprolol tartrate  12.5 mg Oral BID  . sodium chloride flush  3 mL Intravenous Q12H  . warfarin  3 mg Oral ONCE-1800  . Warfarin - Pharmacist Dosing Inpatient   Does not apply q1800   . sodium chloride     sodium chloride, acetaminophen, ondansetron (ZOFRAN) IV, sodium chloride flush  Exam: Alert, no distress No jvd Chest cta Irreg irreg  Abd soft ntnd Ext no edema R arm aVG +bruit NF ox 3  [facility]: [facility] 3h 81min   55.5kg   2/2.25 bath  Hep 4000   RFA AVG - Hect 2 ug - Mircera 30 every 4 wks last 3/27 - Last hb 10.9, pth 375      Assessment: 1. New atrial fib/ flutter - on po amio, low dose BB and coumadin initiation w lovenox bridge 2. ESRD HD TTS 3. Severe aortic stenosis  4. Anemia of CKD - ^ esa at OP unit to every 2 wks at dc 5. MBD / CKD - cont vdra/ sensipar, no binders w P 4 6. Volume is at dry wt  Plan - HD today, min UF   Kelly Splinter MD [place] pager [phone]   08/23/2016, 2:31 PM    Recent Labs Lab 08/20/16 1649 08/21/16 0429 08/23/16 0334  NA 138 137 134*  K 3.9 3.9 5.0  CL 96* 98* 94*  CO2 29 30 25   GLUCOSE 90 89 87  BUN 9 18 47*  CREATININE 2.50* 3.53* 6.63*  CALCIUM 8.6* 8.4* 8.1*   No results for input(s): AST, ALT, ALKPHOS,  BILITOT, PROT, ALBUMIN in the last 168 hours.  Recent Labs Lab 08/20/16 1649 08/21/16 0429 08/22/16 0342 08/23/16 0334  WBC 4.2 3.7* 4.0 4.3  NEUTROABS 1.8  --   --   --   HGB 10.6* 9.8* 9.9* 9.9*  HCT 32.1* 31.4* 30.5* 29.7*  MCV 98.5 99.7 99.3 97.7  PLT 140* 122* 112* 110*   Iron/TIBC/Ferritin/ %Sat No results found for: IRON, TIBC, FERRITIN, IRONPCTSAT

## 2016-08-23 NOTE — Progress Notes (Addendum)
PROGRESS NOTE    CAROLLE Porter  OZH:[id number] DOB: 11-07-1921 DOA: 08/20/2016 PCP: Criselda Peaches, MD     Brief Narrative:  [name] is a 83 y.o. female with past medical history significant of severe aortic stenosis, ESRD on HD Tuesday, Thursday Saturday, chronic anemia, benign bladder mass, chronic cough, dyslipidemia, HTN, obstructive sleep apnea, and history of TIA who presented with tachycardia. She states that in the past week at dialysis sessions, they have told her that her heart rate was very elevated. Initially, they thought they took too much fluid, however, she continued to be tachycardic throughout the week. She was sent from dialysis to emergency department and EKG revealed new onset atrial fibrillation with RVR. She remains asymptomatic. Denies any chest pain, shortness of breath or feeling heart palpitations. She is a very functional 83 year old, lives alone and still driving.  Assessment & Plan:   Active Problems:   ESRD (end stage renal disease) ([place])   Secondary hyperparathyroidism (HCC)   Atrial fibrillation with RVR (HCC)   Aortic stenosis   New onset a-fib ([place])   New onset atrial fibrillation ([place])  New onset atrial fibrillation with RVR -CHADSVASc 6 -Lovenox/Coumadin  -Appreciate cardiology  -Lopressor, limited by low BP. Amiodarone started   ESRD on dialysis T/Th/Sat -Nephrology following   HLD -Lipitor   Chronic anemia of chronic disease -Hgb stable    DVT prophylaxis: lovenox/coumadin  Code Status: full Family Communication: no family at bedside Disposition Plan: pending improvement, discharge pending HR and BP stabilization    Consultants:   Cardiology  Nephrology   Procedures:   None  Antimicrobials:   None    Subjective: Patient feeling well this morning. No complaints. Underwent HD this morning without issue.   Objective: Vitals:   08/23/16 1000 08/23/16 1030 08/23/16 1044 08/23/16 1129  BP: (!) 101/44 (!)  111/48 (!) 106/36 (!) 110/54  Pulse: 77 (!) 59 (!) 59 97  Resp:   (!) 24   Temp:   97.4 F (36.3 C)   TempSrc:   Oral   SpO2:   100%   Weight:   55.5 kg (122 lb 5.7 oz)   Height:        Intake/Output Summary (Last 24 hours) at 08/23/16 1233 Last data filed at 08/23/16 1044  Gross per 24 hour  Intake              180 ml  Output             1000 ml  Net             -820 ml   Filed Weights   08/23/16 0300 08/23/16 0700 08/23/16 1044  Weight: 56.7 kg (125 lb 1.6 oz) 56.8 kg (125 lb 3.5 oz) 55.5 kg (122 lb 5.7 oz)    Examination:  General exam: Appears calm and comfortable  Respiratory system: Clear to auscultation. Respiratory effort normal. Cardiovascular system: S1 & S2 heard, Irreg rhythm, rate 629B, +systolic murmur Gastrointestinal system: Abdomen is nondistended, soft and nontender. No organomegaly or masses felt. Normal bowel sounds heard. Central nervous system: Alert and oriented. No focal neurological deficits. Extremities: Symmetric 5 x 5 power. Skin: No rashes, lesions or ulcers Psychiatry: Judgement and insight appear normal. Mood & affect appropriate.   Data Reviewed: I have personally reviewed following labs and imaging studies  CBC:  Recent Labs Lab 08/20/16 1649 08/21/16 0429 08/22/16 0342 08/23/16 0334  WBC 4.2 3.7* 4.0 4.3  NEUTROABS 1.8  --   --   --  HGB 10.6* 9.8* 9.9* 9.9*  HCT 32.1* 31.4* 30.5* 29.7*  MCV 98.5 99.7 99.3 97.7  PLT 140* 122* 112* 412*   Basic Metabolic Panel:  Recent Labs Lab 08/20/16 1649 08/21/16 0429 08/23/16 0334  NA 138 137 134*  K 3.9 3.9 5.0  CL 96* 98* 94*  CO2 29 30 25   GLUCOSE 90 89 87  BUN 9 18 47*  CREATININE 2.50* 3.53* 6.63*  CALCIUM 8.6* 8.4* 8.1*   GFR: Estimated Creatinine Clearance: 4.6 mL/min (A) (by C-G formula based on SCr of 6.63 mg/dL (H)). Liver Function Tests: No results for input(s): AST, ALT, ALKPHOS, BILITOT, PROT, ALBUMIN in the last 168 hours. No results for input(s): LIPASE,  AMYLASE in the last 168 hours. No results for input(s): AMMONIA in the last 168 hours. Coagulation Profile:  Recent Labs Lab 08/22/16 0342 08/23/16 0334  INR 1.08 1.31   Cardiac Enzymes:  Recent Labs Lab 08/20/16 1649 08/20/16 2253 08/21/16 0429  TROPONINI 0.06* 0.06* 0.06*   BNP (last 3 results) No results for input(s): PROBNP in the last 8760 hours. HbA1C:  Recent Labs  08/21/16 0429  HGBA1C 4.8   CBG: No results for input(s): GLUCAP in the last 168 hours. Lipid Profile:  Recent Labs  08/21/16 0429  CHOL 143  HDL 68  LDLCALC 63  TRIG 62  CHOLHDL 2.1   Thyroid Function Tests:  Recent Labs  08/20/16 1915  TSH 2.400   Anemia Panel: No results for input(s): VITAMINB12, FOLATE, FERRITIN, TIBC, IRON, RETICCTPCT in the last 72 hours. Sepsis Labs: No results for input(s): PROCALCITON, LATICACIDVEN in the last 168 hours.  No results found for this or any previous visit (from the past 240 hour(s)).     Radiology Studies: No results found.    Scheduled Meds: . amiodarone  200 mg Oral BID  . atorvastatin  10 mg Oral QHS  . cinacalcet  30 mg Oral Q breakfast  . darbepoetin (ARANESP) injection - DIALYSIS  60 mcg Intravenous Q Tue-HD  . doxercalciferol  2 mcg Intravenous Q T,Th,Sa-HD  . enoxaparin (LOVENOX) injection  60 mg Subcutaneous Q24H  . metoprolol tartrate  12.5 mg Oral BID  . sodium chloride flush  3 mL Intravenous Q12H  . warfarin  3 mg Oral ONCE-1800  . Warfarin - Pharmacist Dosing Inpatient   Does not apply q1800   Continuous Infusions: . sodium chloride       LOS: 1 day    Time spent: 30 minutes   Dessa Phi, DO [facility] [email or web address] Password [facility] 08/23/2016, 12:33 PM

## 2016-08-24 DIAGNOSIS — D631 Anemia in chronic kidney disease: Secondary | ICD-10-CM

## 2016-08-24 DIAGNOSIS — N189 Chronic kidney disease, unspecified: Secondary | ICD-10-CM

## 2016-08-24 LAB — BASIC METABOLIC PANEL
ANION GAP: 13 (ref 5–15)
BUN: 26 mg/dL — ABNORMAL HIGH (ref 6–20)
CALCIUM: 8.2 mg/dL — AB (ref 8.9–10.3)
CO2: 26 mmol/L (ref 22–32)
Chloride: 93 mmol/L — ABNORMAL LOW (ref 101–111)
Creatinine, Ser: 4.73 mg/dL — ABNORMAL HIGH (ref 0.44–1.00)
GFR calc non Af Amer: 7 mL/min — ABNORMAL LOW (ref >60)
GFR, EST AFRICAN AMERICAN: 8 mL/min — AB (ref >60)
Glucose, Bld: 80 mg/dL (ref 65–99)
POTASSIUM: 4.3 mmol/L (ref 3.5–5.1)
Sodium: 132 mmol/L — ABNORMAL LOW (ref 135–145)

## 2016-08-24 LAB — CBC
HEMATOCRIT: 32.7 % — AB (ref 36.0–46.0)
HEMOGLOBIN: 10.6 g/dL — AB (ref 12.0–15.0)
MCH: 31.5 pg (ref 26.0–34.0)
MCHC: 32.4 g/dL (ref 30.0–36.0)
MCV: 97.3 fL (ref 78.0–100.0)
Platelets: 126 10*3/uL — ABNORMAL LOW (ref 150–400)
RBC: 3.36 MIL/uL — ABNORMAL LOW (ref 3.87–5.11)
RDW: 14.6 % (ref 11.5–15.5)
WBC: 3.8 10*3/uL — ABNORMAL LOW (ref 4.0–10.5)

## 2016-08-24 LAB — PROTIME-INR
INR: 1.6
PROTHROMBIN TIME: 19.2 s — AB (ref 11.4–15.2)

## 2016-08-24 MED ORDER — WARFARIN SODIUM 2 MG PO TABS
3.0000 mg | ORAL_TABLET | Freq: Once | ORAL | Status: AC
  Administered 2016-08-24: 3 mg via ORAL
  Filled 2016-08-24: qty 1

## 2016-08-24 NOTE — Progress Notes (Addendum)
ANTICOAGULATION CONSULT NOTE - Follow Up Consult  Pharmacy Consult for Enoxaparin bridge to Coumadin Indication: atrial fibrillation  Allergies  Allergen Reactions  . Tape Other (See Comments)    Pulls off skin  . Sulfa Antibiotics Other (See Comments)    Bumps all over body    Patient Measurements: Height: 5\' 5"  (165.1 cm) Weight: 123 lb 8 oz (56 kg) IBW/kg (Calculated) : 57  Vital Signs: Temp: 98.4 F (36.9 C) (04/18 0602) Temp Source: Oral (04/18 0602) BP: 113/46 (04/18 0906) Pulse Rate: 78 (04/18 0906)  Labs:  Recent Labs  08/22/16 0342 08/23/16 0334 08/24/16 0418  HGB 9.9* 9.9* 10.6*  HCT 30.5* 29.7* 32.7*  PLT 112* 110* 126*  LABPROT 14.1 16.3* 19.2*  INR 1.08 1.31 1.60  CREATININE  --  6.63* 4.73*    Estimated Creatinine Clearance: 6.6 mL/min (A) (by C-G formula based on SCr of 4.73 mg/dL (H)).   Medications:  Scheduled:  . amiodarone  200 mg Oral BID  . atorvastatin  10 mg Oral QHS  . cinacalcet  30 mg Oral Q breakfast  . darbepoetin (ARANESP) injection - DIALYSIS  60 mcg Intravenous Q Tue-HD  . doxercalciferol  2 mcg Intravenous Q T,Th,Sa-HD  . enoxaparin (LOVENOX) injection  60 mg Subcutaneous Q24H  . metoprolol tartrate  12.5 mg Oral BID  . sodium chloride flush  3 mL Intravenous Q12H  . Warfarin - Pharmacist Dosing Inpatient   Does not apply q1800    Assessment: 83yo ESRD pt with AFib and hx stroke.  INR 1.6 this AM, approaching goal.  Pt does not wish to administer own Lovenox dosing.  Hg and pltc improved today.  No bleeding noted.  Pt is on Amidarone, which may increase Coumadin effect.  Goal of Therapy:  INR 2-3 Anti-Xa level 0.6-1 units/ml 4hrs after LMWH dose given Monitor platelets by anticoagulation protocol: Yes   Plan:  Continue Lovenox 60mg  SQ q24 Repeat Coumadin 3mg  po today Daily INR Watch for s/s of bleeding   Gracy Bruins, PharmD [facility]

## 2016-08-24 NOTE — Progress Notes (Signed)
Progress Note  Patient Name: [name] Date of Encounter: 08/24/2016  Primary Cardiologist: Dr. Vita Barley   Subjective   No chest pain and no SOB,  No palpitations, No complaints   Inpatient Medications    Scheduled Meds: . amiodarone  200 mg Oral BID  . atorvastatin  10 mg Oral QHS  . cinacalcet  30 mg Oral Q breakfast  . darbepoetin (ARANESP) injection - DIALYSIS  60 mcg Intravenous Q Tue-HD  . doxercalciferol  2 mcg Intravenous Q T,Th,Sa-HD  . enoxaparin (LOVENOX) injection  60 mg Subcutaneous Q24H  . metoprolol tartrate  12.5 mg Oral BID  . sodium chloride flush  3 mL Intravenous Q12H  . Warfarin - Pharmacist Dosing Inpatient   Does not apply q1800   Continuous Infusions: . sodium chloride     PRN Meds: sodium chloride, acetaminophen, ondansetron (ZOFRAN) IV, sodium chloride flush   Vital Signs    Vitals:   08/23/16 2131 08/23/16 2132 08/24/16 0602 08/24/16 0906  BP:  110/74 (!) 111/52 (!) 113/46  Pulse:  60 80 78  Resp:  16 (!) 26   Temp: 98.3 F (36.8 C)  98.4 F (36.9 C)   TempSrc: Oral  Oral   SpO2:  96% 94%   Weight:   123 lb 8 oz (56 kg)   Height:        Intake/Output Summary (Last 24 hours) at 08/24/16 0928 Last data filed at 08/24/16 0981  Gross per 24 hour  Intake              600 ml  Output             1000 ml  Net             -400 ml   Filed Weights   08/23/16 0700 08/23/16 1044 08/24/16 0602  Weight: 125 lb 3.5 oz (56.8 kg) 122 lb 5.7 oz (55.5 kg) 123 lb 8 oz (56 kg)    Telemetry    A flutter - rate controlled  Personally Reviewed  Physical Exam   GEN: No acute distress.  Frail Neck: Supple Cardiac: irreg, 3/6 systolic murmur Respiratory: CTA GI: Soft, nontender, non-distended  MS: No edema Neuro:  Nonfocal  Psych: Normal affect   Labs    Chemistry Recent Labs Lab 08/21/16 0429 08/23/16 0334 08/24/16 0418  NA 137 134* 132*  K 3.9 5.0 4.3  CL 98* 94* 93*  CO2 30 25 26   GLUCOSE 89 87 80  BUN 18 47* 26*    CREATININE 3.53* 6.63* 4.73*  CALCIUM 8.4* 8.1* 8.2*  GFRNONAA 10* 5* 7*  GFRAA 12* 6* 8*  ANIONGAP 9 15 13      Hematology Recent Labs Lab 08/22/16 0342 08/23/16 0334 08/24/16 0418  WBC 4.0 4.3 3.8*  RBC 3.07* 3.04* 3.36*  HGB 9.9* 9.9* 10.6*  HCT 30.5* 29.7* 32.7*  MCV 99.3 97.7 97.3  MCH 32.2 32.6 31.5  MCHC 32.5 33.3 32.4  RDW 15.0 14.9 14.6  PLT 112* 110* 126*    Cardiac Enzymes Recent Labs Lab 08/20/16 1649 08/20/16 2253 08/21/16 0429  TROPONINI 0.06* 0.06* 0.06*    Patient Profile     83 y.o. female with past medical history of severe aortic stenosis admitted with new onset atrial flutter.  Assessment & Plan      1 persistent atrial flutter- heart rate improving. Continue amiodarone 200 mg twice a day for 2 weeks and then 200 mg daily thereafter. Note she does have  a history of TIA. Would continue Lovenox until INR greater than 2. Follow INR closely given age, small size and potential amiodarone interaction. She is not a candidate for DOAC given ESRD. Note TSH normal.  -note today INR 1.60  2 history of severe aortic stenosis-patient will need follow-up echocardiogram July 2018. She is not having symptoms at present. She may be a candidate for TAVR in the future if needed.  3 end-stage renal disease-Dialysis per nephrology  4 Hyperlipidemia-continue statin  Signed, Cecilie Kicks, NP  08/24/2016, 9:28 AM    As above, patient seen and examined. Patient denies chest pain or dyspnea. No palpitations or syncope. Patient remains in atrial flutter but heart rate controlled. Will continue amiodarone load at 200 mg twice a day and decrease to 200 mg daily after 2 full weeks. Continue coumadin. Given history of TIA she should continue on Lovenox until INR greater than 2.  Kirk Ruths, MD

## 2016-08-24 NOTE — Progress Notes (Signed)
PROGRESS NOTE    [name]  BJY:[id number] DOB: [birth year] DOA: 08/20/2016 PCP: Criselda Peaches, MD     Brief Narrative:  83 yo female with ESRD on HD, presented to the hospital with tachycardia. Symptoms present for last 7 days prior to hospitalization. On the initial examination found in atrial fibrillation. Patient started on anticoagulation and rate control with amiodarone.    Assessment & Plan:   Active Problems:   ESRD (end stage renal disease) ([place])   Secondary hyperparathyroidism ([place])   Atrial fibrillation with RVR (HCC)   Aortic stenosis   New onset a-fib ([place])   New onset atrial fibrillation ([place])   1. New onset atrial fibrillation.  Rate controlled with amiodarone with good toleration, current dose 200 mg daily. Metoprolol 12.5 mg, bid. Clinically patient euvolemic. High thromboembolic risk, will continue anticoagulation with enoxaparin, renal adjusted per pharmacy and warfarin. INR 1,6, target 2 to 3. Echocardiogram from 07.17 (personally reviewed old records) preserved systolic LV function with EF 60 to 65%  2. ESRD on HD. Will continue HD with ultrafiltration per nephrology recommendations, will continue cinacalcet, aranesp, doxercalciferol.   3. Anemia of chronic renal disease. Hb and Hcy stable, no signs of bleeding, continue aranesp.   4. Dyslipidemia. Continue atorvastatin.   5. History of CVA in the past. Non focal, will continue anticoagulation with warfarin. Physical therapy evaluation.    DVT prophylaxis: enoxaparin Code Status: Full  Family Communication: No family at bedside  Disposition Plan: home    Consultants:   Cardiology   Procedures:      Antimicrobials:    Subjective: Patient with no chest pain, no nausea or vomiting, no dyspnea or palpitations. Tolerating well po. For HD in am. Patient has been on HD for the last 12 years.   Objective: Vitals:   08/23/16 2131 08/23/16 2132 08/24/16 0602 08/24/16 0906  BP:  110/74 (!)  111/52 (!) 113/46  Pulse:  60 80 78  Resp:  16 (!) 26   Temp: 98.3 F (36.8 C)  98.4 F (36.9 C)   TempSrc: Oral  Oral   SpO2:  96% 94%   Weight:   56 kg (123 lb 8 oz)   Height:        Intake/Output Summary (Last 24 hours) at 08/24/16 1125 Last data filed at 08/24/16 0821  Gross per 24 hour  Intake              600 ml  Output                0 ml  Net              600 ml   Filed Weights   08/23/16 0700 08/23/16 1044 08/24/16 0602  Weight: 56.8 kg (125 lb 3.5 oz) 55.5 kg (122 lb 5.7 oz) 56 kg (123 lb 8 oz)    Examination:  General exam: deconditioned, not in pain or dyspnea E ENT. Mild pallor, no icterus, oral mucosa moist.  Respiratory system: Clear to auscultation. Respiratory effort normal. No wheezing, rales or rhonchi. Cardiovascular system: S1 & S2 heard, irregulary irregular No JVD, rubs, gallops or clicks. No pedal edema. Positive systolic murmur at the apex, 3/6 with no radiation.  Gastrointestinal system: Abdomen is nondistended, soft and nontender. No organomegaly or masses felt. Normal bowel sounds heard. Central nervous system: Alert and oriented. No focal neurological deficits. Extremities: Symmetric 5 x 5 power. Skin: No rashes, lesions or ulcers     Data Reviewed: I  have personally reviewed following labs and imaging studies  CBC:  Recent Labs Lab 08/20/16 1649 08/21/16 0429 08/22/16 0342 08/23/16 0334 08/24/16 0418  WBC 4.2 3.7* 4.0 4.3 3.8*  NEUTROABS 1.8  --   --   --   --   HGB 10.6* 9.8* 9.9* 9.9* 10.6*  HCT 32.1* 31.4* 30.5* 29.7* 32.7*  MCV 98.5 99.7 99.3 97.7 97.3  PLT 140* 122* 112* 110* 166*   Basic Metabolic Panel:  Recent Labs Lab 08/20/16 1649 08/21/16 0429 08/23/16 0334 08/24/16 0418  NA 138 137 134* 132*  K 3.9 3.9 5.0 4.3  CL 96* 98* 94* 93*  CO2 29 30 25 26   GLUCOSE 90 89 87 80  BUN 9 18 47* 26*  CREATININE 2.50* 3.53* 6.63* 4.73*  CALCIUM 8.6* 8.4* 8.1* 8.2*   GFR: Estimated Creatinine Clearance: 6.6 mL/min (A)  (by C-G formula based on SCr of 4.73 mg/dL (H)). Liver Function Tests: No results for input(s): AST, ALT, ALKPHOS, BILITOT, PROT, ALBUMIN in the last 168 hours. No results for input(s): LIPASE, AMYLASE in the last 168 hours. No results for input(s): AMMONIA in the last 168 hours. Coagulation Profile:  Recent Labs Lab 08/22/16 0342 08/23/16 0334 08/24/16 0418  INR 1.08 1.31 1.60   Cardiac Enzymes:  Recent Labs Lab 08/20/16 1649 08/20/16 2253 08/21/16 0429  TROPONINI 0.06* 0.06* 0.06*   BNP (last 3 results) No results for input(s): PROBNP in the last 8760 hours. HbA1C: No results for input(s): HGBA1C in the last 72 hours. CBG: No results for input(s): GLUCAP in the last 168 hours. Lipid Profile: No results for input(s): CHOL, HDL, LDLCALC, TRIG, CHOLHDL, LDLDIRECT in the last 72 hours. Thyroid Function Tests: No results for input(s): TSH, T4TOTAL, FREET4, T3FREE, THYROIDAB in the last 72 hours. Anemia Panel: No results for input(s): VITAMINB12, FOLATE, FERRITIN, TIBC, IRON, RETICCTPCT in the last 72 hours. Sepsis Labs: No results for input(s): PROCALCITON, LATICACIDVEN in the last 168 hours.  No results found for this or any previous visit (from the past 240 hour(s)).       Radiology Studies: No results found.      Scheduled Meds: . amiodarone  200 mg Oral BID  . atorvastatin  10 mg Oral QHS  . cinacalcet  30 mg Oral Q breakfast  . darbepoetin (ARANESP) injection - DIALYSIS  60 mcg Intravenous Q Tue-HD  . doxercalciferol  2 mcg Intravenous Q T,Th,Sa-HD  . enoxaparin (LOVENOX) injection  60 mg Subcutaneous Q24H  . metoprolol tartrate  12.5 mg Oral BID  . sodium chloride flush  3 mL Intravenous Q12H  . warfarin  3 mg Oral ONCE-1800  . Warfarin - Pharmacist Dosing Inpatient   Does not apply q1800   Continuous Infusions: . sodium chloride       LOS: 2 days       Tawni Millers, MD [facility] Pager [phone]  If 7PM-7AM, please  contact night-coverage [email or web address] Password [facility] 08/24/2016, 11:25 AM

## 2016-08-24 NOTE — Progress Notes (Signed)
  [facility] Progress Note   Subjective: INR 1.6  Vitals:   08/23/16 2131 08/23/16 2132 08/24/16 0602 08/24/16 0906  BP:  110/74 (!) 111/52 (!) 113/46  Pulse:  60 80 78  Resp:  16 (!) 26   Temp: 98.3 F (36.8 C)  98.4 F (36.9 C)   TempSrc: Oral  Oral   SpO2:  96% 94%   Weight:   56 kg (123 lb 8 oz)   Height:        Inpatient medications: . amiodarone  200 mg Oral BID  . atorvastatin  10 mg Oral QHS  . cinacalcet  30 mg Oral Q breakfast  . darbepoetin (ARANESP) injection - DIALYSIS  60 mcg Intravenous Q Tue-HD  . doxercalciferol  2 mcg Intravenous Q T,Th,Sa-HD  . enoxaparin (LOVENOX) injection  60 mg Subcutaneous Q24H  . metoprolol tartrate  12.5 mg Oral BID  . sodium chloride flush  3 mL Intravenous Q12H  . warfarin  3 mg Oral ONCE-1800  . Warfarin - Pharmacist Dosing Inpatient   Does not apply q1800   . sodium chloride     sodium chloride, acetaminophen, ondansetron (ZOFRAN) IV, sodium chloride flush  Exam: Alert, no distress No jvd Chest cta Irreg irreg  Abd soft ntnd Ext no edema R arm aVG +bruit NF ox 3  D[facility]: [facility] 3h 23min   55.5kg   2/2.25 bath  Hep 4000   RFA AVG - Hect 2 ug - Mircera 30 every 4 wks last 3/27 - Last hb 10.9, pth 375      Assessment: 1. New atrial fib/ flutter - on po amio, low dose BB and new coumadin w lovenox bridge; awaiting Rx INR for dc 2. ESRD HD TTS 3. Severe aortic stenosis  4. Anemia of CKD - ^ esa at OP unit to every 2 wks at dc 5. MBD / CKD - cont vdra/ sensipar, no binders w P 4 6. Volume is at dry wt  Plan - HD tomorrow, UF to dry   Kelly Splinter MD [place] pager [phone]   08/24/2016, 12:21 PM    Recent Labs Lab 08/21/16 0429 08/23/16 0334 08/24/16 0418  NA 137 134* 132*  K 3.9 5.0 4.3  CL 98* 94* 93*  CO2 30 25 26   GLUCOSE 89 87 80  BUN 18 47* 26*  CREATININE 3.53* 6.63* 4.73*  CALCIUM 8.4* 8.1* 8.2*   No results for input(s): AST, ALT, ALKPHOS,  BILITOT, PROT, ALBUMIN in the last 168 hours.  Recent Labs Lab 08/20/16 1649  08/22/16 0342 08/23/16 0334 08/24/16 0418  WBC 4.2  < > 4.0 4.3 3.8*  NEUTROABS 1.8  --   --   --   --   HGB 10.6*  < > 9.9* 9.9* 10.6*  HCT 32.1*  < > 30.5* 29.7* 32.7*  MCV 98.5  < > 99.3 97.7 97.3  PLT 140*  < > 112* 110* 126*  < > = values in this interval not displayed. Iron/TIBC/Ferritin/ %Sat No results found for: IRON, TIBC, FERRITIN, IRONPCTSAT

## 2016-08-25 ENCOUNTER — Telehealth: Payer: Self-pay | Admitting: Cardiology

## 2016-08-25 DIAGNOSIS — I4892 Unspecified atrial flutter: Secondary | ICD-10-CM

## 2016-08-25 LAB — BASIC METABOLIC PANEL
ANION GAP: 16 — AB (ref 5–15)
BUN: 38 mg/dL — ABNORMAL HIGH (ref 6–20)
CALCIUM: 8.2 mg/dL — AB (ref 8.9–10.3)
CO2: 24 mmol/L (ref 22–32)
Chloride: 91 mmol/L — ABNORMAL LOW (ref 101–111)
Creatinine, Ser: 6.03 mg/dL — ABNORMAL HIGH (ref 0.44–1.00)
GFR, EST AFRICAN AMERICAN: 6 mL/min — AB (ref >60)
GFR, EST NON AFRICAN AMERICAN: 5 mL/min — AB (ref >60)
Glucose, Bld: 86 mg/dL (ref 65–99)
Potassium: 4.3 mmol/L (ref 3.5–5.1)
Sodium: 131 mmol/L — ABNORMAL LOW (ref 135–145)

## 2016-08-25 LAB — PROTIME-INR
INR: 2.08
PROTHROMBIN TIME: 23.7 s — AB (ref 11.4–15.2)

## 2016-08-25 MED ORDER — LIDOCAINE-PRILOCAINE 2.5-2.5 % EX CREA: 1.0000 "application " | TOPICAL_CREAM | CUTANEOUS | Status: DC | PRN

## 2016-08-25 MED ORDER — SODIUM CHLORIDE 0.9 % IV SOLN: 100.0000 mL | INTRAVENOUS | Status: DC | PRN

## 2016-08-25 MED ORDER — HEPARIN SODIUM (PORCINE) 1000 UNIT/ML DIALYSIS: 1000.0000 [IU] | INTRAMUSCULAR | Status: DC | PRN

## 2016-08-25 MED ORDER — WARFARIN SODIUM 2.5 MG PO TABS: 2.5000 mg | ORAL_TABLET | Freq: Once | ORAL | 0 refills | Status: DC

## 2016-08-25 MED ORDER — PENTAFLUOROPROP-TETRAFLUOROETH EX AERO: 1.0000 "application " | INHALATION_SPRAY | CUTANEOUS | Status: DC | PRN

## 2016-08-25 MED ORDER — AMIODARONE HCL 200 MG PO TABS: 200.0000 mg | ORAL_TABLET | Freq: Two times a day (BID) | ORAL | 0 refills | Status: DC

## 2016-08-25 MED ORDER — DOXERCALCIFEROL 4 MCG/2ML IV SOLN
INTRAVENOUS | Status: AC
  Administered 2016-08-25: 2 ug via INTRAVENOUS
  Filled 2016-08-25: qty 2

## 2016-08-25 MED ORDER — METOPROLOL TARTRATE 25 MG PO TABS: 12.5000 mg | ORAL_TABLET | Freq: Two times a day (BID) | ORAL | 0 refills | Status: DC

## 2016-08-25 MED ORDER — ALTEPLASE 2 MG IJ SOLR: 2.0000 mg | Freq: Once | INTRAMUSCULAR | Status: DC | PRN

## 2016-08-25 MED ORDER — LIDOCAINE HCL (PF) 1 % IJ SOLN: 5.0000 mL | INTRAMUSCULAR | Status: DC | PRN

## 2016-08-25 MED ORDER — WARFARIN SODIUM 2.5 MG PO TABS
2.5000 mg | ORAL_TABLET | Freq: Once | ORAL | Status: DC
  Filled 2016-08-25: qty 1

## 2016-08-25 NOTE — Progress Notes (Addendum)
Progress Note  Patient Name: [name] Date of Encounter: 08/25/2016  Primary Cardiologist: Dr. Vita Barley   Subjective   Pt seen in dialysis. No chest pain and no SOB,  No palpitations.  Inpatient Medications    Scheduled Meds: . amiodarone  200 mg Oral BID  . atorvastatin  10 mg Oral QHS  . cinacalcet  30 mg Oral Q breakfast  . darbepoetin (ARANESP) injection - DIALYSIS  60 mcg Intravenous Q Tue-HD  . doxercalciferol      . doxercalciferol  2 mcg Intravenous Q T,Th,Sa-HD  . enoxaparin (LOVENOX) injection  60 mg Subcutaneous Q24H  . metoprolol tartrate  12.5 mg Oral BID  . sodium chloride flush  3 mL Intravenous Q12H  . Warfarin - Pharmacist Dosing Inpatient   Does not apply q1800   Continuous Infusions: . sodium chloride    . sodium chloride    . sodium chloride     PRN Meds: sodium chloride, sodium chloride, sodium chloride, acetaminophen, alteplase, heparin, lidocaine (PF), lidocaine-prilocaine, ondansetron (ZOFRAN) IV, pentafluoroprop-tetrafluoroeth, sodium chloride flush   Vital Signs    Vitals:   08/25/16 0639 08/25/16 0642 08/25/16 0700 08/25/16 0730  BP: 116/64 114/60 102/60 (!) 102/54  Pulse: (!) 104 80 69 90  Resp: 20 19 19 20   Temp: 98 F (36.7 C)     TempSrc: Oral     SpO2: 96%     Weight: 124 lb 5.4 oz (56.4 kg)     Height:        Intake/Output Summary (Last 24 hours) at 08/25/16 0753 Last data filed at 08/24/16 1817  Gross per 24 hour  Intake              240 ml  Output                0 ml  Net              240 ml   Filed Weights   08/24/16 0602 08/25/16 0534 08/25/16 0639  Weight: 123 lb 8 oz (56 kg) 125 lb 6.4 oz (56.9 kg) 124 lb 5.4 oz (56.4 kg)    Telemetry    A flutter - rate controlled  Personally Reviewed  Physical Exam   GEN: No acute distress.  In dialysis Neck: Supple, no bruits Cardiac: irreg, normal rate, 3/6 systolic murmur Respiratory: CTA GI: Soft, nontender, non-distended, no masses MS: No edema Neuro:   Nonfocal  Psych: Normal affect   Labs    Chemistry  Recent Labs Lab 08/23/16 0334 08/24/16 0418 08/25/16 0328  NA 134* 132* 131*  K 5.0 4.3 4.3  CL 94* 93* 91*  CO2 25 26 24   GLUCOSE 87 80 86  BUN 47* 26* 38*  CREATININE 6.63* 4.73* 6.03*  CALCIUM 8.1* 8.2* 8.2*  GFRNONAA 5* 7* 5*  GFRAA 6* 8* 6*  ANIONGAP 15 13 16*     Hematology  Recent Labs Lab 08/22/16 0342 08/23/16 0334 08/24/16 0418  WBC 4.0 4.3 3.8*  RBC 3.07* 3.04* 3.36*  HGB 9.9* 9.9* 10.6*  HCT 30.5* 29.7* 32.7*  MCV 99.3 97.7 97.3  MCH 32.2 32.6 31.5  MCHC 32.5 33.3 32.4  RDW 15.0 14.9 14.6  PLT 112* 110* 126*    Cardiac Enzymes  Recent Labs Lab 08/20/16 1649 08/20/16 2253 08/21/16 0429  TROPONINI 0.06* 0.06* 0.06*    Patient Profile     83 y.o. female with past medical history of severe aortic stenosis admitted with new onset  atrial flutter.  Assessment & Plan      1 persistent atrial flutter- heart rate controlled. Continue amiodarone 200 mg twice a day for 2 weeks and then 200 mg daily thereafter. Continue metoprolol. INR now > 2. DC lovenox. Would DC on coumadin 2.5 mg daily; check INR in coumadin clinic 4/23. Follow INR closely given age, small size and potential amiodarone interaction. She is not a candidate for DOAC given ESRD. Note TSH normal.    2 history of severe aortic stenosis-patient will need follow-up echocardiogram July 2018. She may be a candidate for TAVR in the future if needed. Presently asymptomatic.  3 end-stage renal disease-Dialysis per nephrology  4 Hyperlipidemia-continue statin  Pt can be Dced from a cardiac standpoint; pt should have TOC appt with APP one week; fu with Dr Percival Spanish 8 weeks.  Signed, Kirk Ruths, MD  08/25/2016, 7:53 AM

## 2016-08-25 NOTE — Telephone Encounter (Signed)
TCM Phone Call-Please Call Pt-Appointment with Kerin Ransom on 09-07-16 Per Cecilie Kicks.

## 2016-08-25 NOTE — Progress Notes (Signed)
ANTICOAGULATION CONSULT NOTE - Follow Up Consult  Pharmacy Consult for warfarin Indication: atrial fibrillation  Allergies  Allergen Reactions  . Tape Other (See Comments)    Pulls off skin  . Sulfa Antibiotics Other (See Comments)    Bumps all over body    Patient Measurements: Height: 5\' 5"  (165.1 cm) Weight: 124 lb 5.4 oz (56.4 kg) IBW/kg (Calculated) : 57  Vital Signs: Temp: 98 F (36.7 C) (04/19 0639) Temp Source: Oral (04/19 0639) BP: 108/58 (04/19 0830) Pulse Rate: 84 (04/19 0830)  Labs:  Recent Labs  08/23/16 0334 08/24/16 0418 08/25/16 0328  HGB 9.9* 10.6*  --   HCT 29.7* 32.7*  --   PLT 110* 126*  --   LABPROT 16.3* 19.2* 23.7*  INR 1.31 1.60 2.08  CREATININE 6.63* 4.73* 6.03*    Estimated Creatinine Clearance: 5.2 mL/min (A) (by C-G formula based on SCr of 6.03 mg/dL (H)).  Assessment: 83yo ESRD pt with AFib and hx stroke. Was bridging with Lovenox to warfarin, now INR therapeutic at 2.08, and Lovenox stopped.  Hgb 10.6, plts 126- no bleeding noted. Patient on Aranesp qTues, no iron supplementation.  Goal of Therapy:  INR 2-3 Monitor platelets by anticoagulation protocol: Yes   Plan:  Warfarin 2.5mg  po today Daily INR Watch for s/s of bleeding  Ashar Lewinski D. Bibiana Gillean, PharmD, BCPS Clinical Pharmacist Pager: [phone] 08/25/2016 8:52 AM

## 2016-08-25 NOTE — Progress Notes (Signed)
PT Cancellation Note  Patient Details Name: [name] MRN: [id number] DOB: Feb 07, 1922   Cancelled Treatment:    Reason Eval/Treat Not Completed: Patient at procedure or test/unavailable (pt in HD)   [place] 08/25/2016, 9:39 AM Suanne Marker PT [phone]

## 2016-08-25 NOTE — Discharge Summary (Signed)
Physician Discharge Summary  [name] HFW:[id number] DOB: [birth year] DOA: 08/20/2016  PCP: Criselda Peaches, MD  Admit date: 08/20/2016 Discharge date: 08/25/2016  Admitted From:Home Disposition:home  Recommendations for Outpatient Follow-up:  1. Follow up with PCP in 1-2 weeks 2. Please obtain BMP/CBC in one week 3. Check INR on 4/23 at coumadin clinic  Home Health:yes Equipment/Devices:none Discharge Condition:stable CODE STATUS:full Diet recommendation:heart healthy  Brief/Interim Summary: 83 yo female with ESRD on HD, presented to the hospital with tachycardia. Symptoms present for last 7 days prior to hospitalization. On the initial examination found in atrial fibrillation. Patient started on anticoagulation and rate control with amiodarone.   # Persistent atrial flutter with RVR on admission: Evaluated by cardiologist. Started on amiodarone, metoprolol and systemic anticoagulation. INR is therapeutic today therefore discharging with oral Coumadin. Outpatient follow-up at Coumadin clinic and cardiologist already made. Patient is clinically improved. She verbalized understanding of her medications and follow-up plan. She will be on amiodarone twice a day for 2 weeks and then change to daily. Heart rate is better controlled.   - Echocardiogram showed normal systolic function and grade 2 diastolic dysfunction. -Patient with history of severe aortic stenosis for which she will follow up with cardiologist for repeat echocardiogram and further evaluation. Currently patient is asymptomatic and clinically improved.  # ESRD on HD. Receiving hemodialysis treatment as per nephrologist.  # Anemia of chronic renal disease. h/h stable, continue to monitor and  continue aranesp.   # Dyslipidemia. Continue atorvastatin.   # History of CVA in the past.stable. PT/OT/coumadin   Discharge Diagnoses:  Active Problems:   ESRD (end stage renal disease) ([place])   Secondary  hyperparathyroidism ([place])   Aortic stenosis   New onset atrial flutter.     Discharge Instructions  Discharge Instructions    Call MD for:  difficulty breathing, headache or visual disturbances    Complete by:  As directed    Call MD for:  extreme fatigue    Complete by:  As directed    Call MD for:  hives    Complete by:  As directed    Call MD for:  persistant dizziness or light-headedness    Complete by:  As directed    Call MD for:  persistant nausea and vomiting    Complete by:  As directed    Call MD for:  severe uncontrolled pain    Complete by:  As directed    Call MD for:  temperature >100.4    Complete by:  As directed    Diet - low sodium heart healthy    Complete by:  As directed    Increase activity slowly    Complete by:  As directed      Allergies as of 08/25/2016      Reactions   Tape Other (See Comments)   Pulls off skin   Sulfa Antibiotics Other (See Comments)   Bumps all over body      Medication List    STOP taking these medications   aspirin 81 MG tablet     TAKE these medications   acetaminophen 500 MG tablet Commonly known as:  TYLENOL Take 1,000 mg by mouth every 6 (six) hours as needed (pain).   amiodarone 200 MG tablet Commonly known as:  PACERONE Take 1 tablet (200 mg total) by mouth 2 (two) times daily. Please take twice a day till 4/30 and then once a day. Follow up with cardiologist.   atorvastatin 10 MG tablet Commonly  known as:  LIPITOR Take 10 mg by mouth at bedtime.   metoprolol tartrate 25 MG tablet Commonly known as:  LOPRESSOR Take 0.5 tablets (12.5 mg total) by mouth 2 (two) times daily.   multivitamin Tabs tablet Take 1 tablet by mouth daily.   SENSIPAR 30 MG tablet Generic drug:  cinacalcet Take 30 mg by mouth daily.   warfarin 2.5 MG tablet Commonly known as:  COUMADIN Take 1 tablet (2.5 mg total) by mouth one time only at 6 PM. Please adjust the dose depending on INR result.      Follow-up Information     Minus Breeding, MD Follow up on 10/28/2016.   Specialty:  Cardiology Why:  at 7:40 AM  for Dr. Sofie Rower information: [address] [place] [address] [phone]        Kerin Ransom, PA-C Follow up on 09/07/2016.   Specialties:  Cardiology, Radiology Why:  at 3:00 Pm  for follow up  Contact information: [place] [id number] [place] Follow up on 08/29/2016.   Specialty:  Cardiology Why:   at 3:00 PM for coumadin clinic appointment,  please do not miss Contact information: [address] [place] [phone]       GREEN, Keenan Bachelor, MD. Schedule an appointment as soon as possible for a visit in 1 week(s).   Specialty:  Internal Medicine Contact information: [address], [address] [place] [address] [phone]          Allergies  Allergen Reactions  . Tape Other (See Comments)    Pulls off skin  . Sulfa Antibiotics Other (See Comments)    Bumps all over body    Consultations: Cardiologist Nephrologist  Procedures/Studies: None  Subjective: Patient was seen and examined at dialysis unit. She reported doing well. Denied headache, dizziness, nausea, vomiting, chest pain or shortness of breath.  Discharge Exam: Vitals:   08/25/16 1012 08/25/16 1018  BP: (!) 108/55 (!) 115/55  Pulse: 90 88  Resp: 16   Temp: 97.6 F (36.4 C)    Vitals:   08/25/16 0930 08/25/16 1000 08/25/16 1012 08/25/16 1018  BP: (!) 112/56 (!) 101/49 (!) 108/55 (!) 115/55  Pulse: 84 84 90 88  Resp: 16 15 16    Temp:   97.6 F (36.4 C)   TempSrc:   Oral   SpO2:   99%   Weight:   55.4 kg (122 lb 2.2 oz)   Height:        General: Pt is alert, awake, not in acute distress Cardiovascular: Irregular heart rate, S1/S2 +, no rubs Respiratory: CTA bilaterally, no wheezing, no rhonchi Abdominal: Soft, NT, ND, bowel sounds + Extremities: no edema, no  cyanosis    The results of significant diagnostics from this hospitalization (including imaging, microbiology, ancillary and laboratory) are listed below for reference.     Microbiology: No results found for this or any previous visit (from the past 240 hour(s)).   Labs: BNP (last 3 results) No results for input(s): BNP in the last 8760 hours. Basic Metabolic Panel:  Recent Labs Lab 08/20/16 1649 08/21/16 0429 08/23/16 0334 08/24/16 0418 08/25/16 0328  NA 138 137 134* 132* 131*  K 3.9 3.9 5.0 4.3 4.3  CL 96* 98* 94* 93* 91*  CO2 29 30 25 26 24   GLUCOSE 90 89 87 80 86  BUN 9 18 47* 26* 38*  CREATININE 2.50* 3.53* 6.63* 4.73* 6.03*  CALCIUM 8.6* 8.4* 8.1* 8.2* 8.2*   Liver Function Tests: No results for input(s): AST, ALT, ALKPHOS, BILITOT, PROT, ALBUMIN in the last 168 hours. No results for input(s): LIPASE, AMYLASE in the last 168 hours. No results for input(s): AMMONIA in the last 168 hours. CBC:  Recent Labs Lab 08/20/16 1649 08/21/16 0429 08/22/16 0342 08/23/16 0334 08/24/16 0418  WBC 4.2 3.7* 4.0 4.3 3.8*  NEUTROABS 1.8  --   --   --   --   HGB 10.6* 9.8* 9.9* 9.9* 10.6*  HCT 32.1* 31.4* 30.5* 29.7* 32.7*  MCV 98.5 99.7 99.3 97.7 97.3  PLT 140* 122* 112* 110* 126*   Cardiac Enzymes:  Recent Labs Lab 08/20/16 1649 08/20/16 2253 08/21/16 0429  TROPONINI 0.06* 0.06* 0.06*   BNP: Invalid input(s): POCBNP CBG: No results for input(s): GLUCAP in the last 168 hours. D-Dimer No results for input(s): DDIMER in the last 72 hours. Hgb A1c No results for input(s): HGBA1C in the last 72 hours. Lipid Profile No results for input(s): CHOL, HDL, LDLCALC, TRIG, CHOLHDL, LDLDIRECT in the last 72 hours. Thyroid function studies No results for input(s): TSH, T4TOTAL, T3FREE, THYROIDAB in the last 72 hours.  Invalid input(s): FREET3 Anemia work up No results for input(s): VITAMINB12, FOLATE, FERRITIN, TIBC, IRON, RETICCTPCT in the last 72  hours. Urinalysis No results found for: COLORURINE, APPEARANCEUR, LABSPEC, [place], GLUCOSEU, HGBUR, BILIRUBINUR, KETONESUR, PROTEINUR, UROBILINOGEN, NITRITE, LEUKOCYTESUR Sepsis Labs Invalid input(s): PROCALCITONIN,  WBC,  LACTICIDVEN Microbiology No results found for this or any previous visit (from the past 240 hour(s)).   Time coordinating discharge: 28 minutes  SIGNED:   Rosita Fire, MD  [facility] 08/25/2016, 10:57 AM  If 7PM-7AM, please contact night-coverage [email or web address] Password [email or web address]

## 2016-08-25 NOTE — Procedures (Signed)
  I was present at this dialysis session, have reviewed the session itself and made  appropriate changes Kelly Splinter MD [place] pager [phone]   08/25/2016, 12:53 PM

## 2016-08-26 NOTE — Telephone Encounter (Signed)
Patient contacted regarding discharge from [facility] on 08/25/16.  Patient understands to follow up with provider Lurena Joiner, [place] on 09/07/16 at 3pm at [facility].  Coumadin clinic appt 08/29/16 @ 3pm Patient understands discharge instructions? YES Patient understands medications and regiment? YES Patient understands to bring all medications to this visit? YES

## 2016-08-29 ENCOUNTER — Ambulatory Visit (INDEPENDENT_AMBULATORY_CARE_PROVIDER_SITE_OTHER): Payer: Medicare Other | Admitting: Pharmacist Clinician (PhC)/ Clinical Pharmacy Specialist

## 2016-08-29 DIAGNOSIS — Z7901 Long term (current) use of anticoagulants: Secondary | ICD-10-CM | POA: Diagnosis not present

## 2016-08-29 DIAGNOSIS — I4891 Unspecified atrial fibrillation: Secondary | ICD-10-CM | POA: Diagnosis not present

## 2016-08-29 DIAGNOSIS — Z5181 Encounter for therapeutic drug level monitoring: Secondary | ICD-10-CM | POA: Insufficient documentation

## 2016-08-29 LAB — POCT INR: INR: 2.5

## 2016-09-01 ENCOUNTER — Ambulatory Visit (INDEPENDENT_AMBULATORY_CARE_PROVIDER_SITE_OTHER): Payer: Medicare Other | Admitting: Pharmacist

## 2016-09-01 DIAGNOSIS — I4891 Unspecified atrial fibrillation: Secondary | ICD-10-CM | POA: Diagnosis not present

## 2016-09-01 DIAGNOSIS — Z7901 Long term (current) use of anticoagulants: Secondary | ICD-10-CM

## 2016-09-01 DIAGNOSIS — Z5181 Encounter for therapeutic drug level monitoring: Secondary | ICD-10-CM

## 2016-09-01 LAB — POCT INR: INR: 2.5

## 2016-09-07 ENCOUNTER — Ambulatory Visit (INDEPENDENT_AMBULATORY_CARE_PROVIDER_SITE_OTHER): Payer: Medicare Other | Admitting: Cardiology

## 2016-09-07 ENCOUNTER — Other Ambulatory Visit: Payer: Self-pay | Admitting: Pharmacist Clinician (PhC)/ Clinical Pharmacy Specialist

## 2016-09-07 ENCOUNTER — Encounter: Payer: Self-pay | Admitting: Cardiology

## 2016-09-07 ENCOUNTER — Ambulatory Visit (INDEPENDENT_AMBULATORY_CARE_PROVIDER_SITE_OTHER): Payer: Medicare Other | Admitting: Pharmacist Clinician (PhC)/ Clinical Pharmacy Specialist

## 2016-09-07 DIAGNOSIS — I4891 Unspecified atrial fibrillation: Secondary | ICD-10-CM

## 2016-09-07 DIAGNOSIS — Z7901 Long term (current) use of anticoagulants: Secondary | ICD-10-CM | POA: Insufficient documentation

## 2016-09-07 DIAGNOSIS — N186 End stage renal disease: Secondary | ICD-10-CM | POA: Diagnosis not present

## 2016-09-07 DIAGNOSIS — Z5181 Encounter for therapeutic drug level monitoring: Secondary | ICD-10-CM | POA: Diagnosis not present

## 2016-09-07 DIAGNOSIS — I350 Nonrheumatic aortic (valve) stenosis: Secondary | ICD-10-CM | POA: Diagnosis not present

## 2016-09-07 LAB — POCT INR: INR: 1.5

## 2016-09-07 MED ORDER — AMIODARONE HCL 200 MG PO TABS: 100.0000 mg | ORAL_TABLET | Freq: Every day | ORAL | 5 refills | Status: DC

## 2016-09-07 MED ORDER — WARFARIN SODIUM 2.5 MG PO TABS: ORAL_TABLET | ORAL | 3 refills | Status: DC

## 2016-09-07 NOTE — Assessment & Plan Note (Signed)
Pt hospitalized after her HR was noted to be fast at dialysis. She was placed on Amiodarone and Coumadin. She has converted to NSR

## 2016-09-07 NOTE — Assessment & Plan Note (Signed)
HD T Th Sat 

## 2016-09-07 NOTE — Assessment & Plan Note (Signed)
Severe AS with preserved LVF by echo July 2017. She has been asymptomatic. Possibly a TAVR candidate if she becomes symptomatic.

## 2016-09-07 NOTE — Patient Instructions (Signed)
Medication Instructions:   CHANGE amiodarone dose to 100mg  (1/2 of 200mg  TABLET) ONCE DAILY  Take COUMADIN as instructed by pharmacist (see attached paperwork)  Labwork:   none  Testing/Procedures:  none  Follow-Up:  Keep your upcoming appointments with coumadin clinic and with Dr. Percival Spanish    If you need a refill on your cardiac medications before your next appointment, please call your pharmacy.

## 2016-09-07 NOTE — Progress Notes (Signed)
09/07/2016 [name]   [birth year]  [id number]  Primary Physician GREEN, Keenan Bachelor, MD Primary Cardiologist: Dr Charlesetta Shanks  HPI:  83 y/o remarkable AA female, lives in her own home, still drives, with a history of HTN, ESRD, and severe AS. She was admitted from HD recently with a fast HR and noted to be in new onset AF with RVR. She tolerated his well. She was placed on Amiodarone and converted to NSR. She is in the office today for TOC f/u. She remains asymptomatic, no palpitations, chest pain, DOE, or history of syncope. She cut back on her medications on her own which is a little concerning. I suspect she was afraid of possible side effects. She skipped two days of Coumadin and cut her Amiodarone back to 100 mg daily.    Current Outpatient Prescriptions  Medication Sig Dispense Refill  . acetaminophen (TYLENOL) 500 MG tablet Take 1,000 mg by mouth every 6 (six) hours as needed (pain).     Marland Kitchen amiodarone (PACERONE) 200 MG tablet Take 0.5 tablets (100 mg total) by mouth daily. 30 tablet 5  . atorvastatin (LIPITOR) 10 MG tablet Take 10 mg by mouth at bedtime.      . metoprolol tartrate (LOPRESSOR) 25 MG tablet Take 0.5 tablets (12.5 mg total) by mouth 2 (two) times daily. 60 tablet 0  . multivitamin (RENA-VIT) TABS tablet Take 1 tablet by mouth daily.    . SENSIPAR 30 MG tablet Take 30 mg by mouth daily.     Marland Kitchen warfarin (COUMADIN) 2.5 MG tablet Take 1 tablet by mouth daily or as directed by coumadin clinic 30 tablet 3   No current facility-administered medications for this visit.     Allergies  Allergen Reactions  . Tape Other (See Comments)    Pulls off skin  . Sulfa Antibiotics Other (See Comments)    Bumps all over body    Past Medical History:  Diagnosis Date  . Anemia   . Aortic stenosis   . Benign bladder mass   . Chronic cough   . DJD (degenerative joint disease)   . Dyslipidemia   . ESRD on hemodialysis ([place])   . Gout   . HTN (hypertension)   . Obstructive sleep  apnea    mild  . Orthostatic syncope    around year 2006  . Pneumonia   . TIA (transient ischemic attack)     Social History   Social History  . Marital status: Widowed    Spouse name: N/A  . Number of children: N/A  . Years of education: N/A   Occupational History  . Retired     Secretary/administrator   Social History Main Topics  . Smoking status: Never Smoker  . Smokeless tobacco: Never Used  . Alcohol use No  . Drug use: No  . Sexual activity: No   Other Topics Concern  . Not on file   Social History Narrative  . No narrative on file     Family History  Problem Relation Age of Onset  . Hypertension Mother   . Diabetes Mother   . Hypertension Other      Review of Systems: General: negative for chills, fever, night sweats or weight changes.  Cardiovascular: negative for chest pain, dyspnea on exertion, edema, orthopnea, palpitations, paroxysmal nocturnal dyspnea or shortness of breath Dermatological: negative for rash Respiratory: negative for cough or wheezing Urologic: negative for hematuria Abdominal: negative for nausea, vomiting, diarrhea, bright red blood per rectum, melena, or  hematemesis Neurologic: negative for visual changes, syncope, or dizziness All other systems reviewed and are otherwise negative except as noted above.    Blood pressure (!) 144/60, pulse 70, height 5\' 5"  (1.651 m), weight 124 lb (56.2 kg).  General appearance: alert, cooperative, appears stated age, cachectic and no distress Neck: transmitted murmur Lungs: clear to auscultation bilaterally Heart: regular rate and rhythm and 2/6 systolic murmur Extremities: no edema Skin: cool, dry Neurologic: Grossly normal   ASSESSMENT AND PLAN:   Atrial fibrillation with RVR (H[facility]) Pt hospitalized after her HR was noted to be fast at dialysis. She was placed on Amiodarone and Coumadin. She has converted to NSR  ESRD (end stage renal disease) ([place]) HD T-Th-Sat  Severe aortic  stenosis Severe AS with preserved LVF by echo July 2017. She has been asymptomatic. Possibly a TAVR candidate if she becomes symptomatic.   PLAN  I told her she could remain on Amiodarone 100 mg daily, her HR is 68 and she is in NSR on exam. We discussed symptoms of symptomatic AS. F/U with Dr Percival Spanish in June as scheduled. We are following her INR but once this is stable it could be done at dialysis.   Kerin Ransom PA-C 09/07/2016 3:45 PM

## 2016-09-15 LAB — PROTIME-INR

## 2016-09-19 ENCOUNTER — Ambulatory Visit (INDEPENDENT_AMBULATORY_CARE_PROVIDER_SITE_OTHER): Payer: Medicare Other | Admitting: Pharmacist Clinician (PhC)/ Clinical Pharmacy Specialist

## 2016-09-19 DIAGNOSIS — I4891 Unspecified atrial fibrillation: Secondary | ICD-10-CM | POA: Diagnosis not present

## 2016-09-19 DIAGNOSIS — Z7901 Long term (current) use of anticoagulants: Secondary | ICD-10-CM

## 2016-09-19 DIAGNOSIS — Z5181 Encounter for therapeutic drug level monitoring: Secondary | ICD-10-CM

## 2016-09-19 LAB — POCT INR: INR: 1.3

## 2016-09-26 ENCOUNTER — Ambulatory Visit (INDEPENDENT_AMBULATORY_CARE_PROVIDER_SITE_OTHER): Payer: Medicare Other | Admitting: Pharmacist

## 2016-09-26 DIAGNOSIS — Z5181 Encounter for therapeutic drug level monitoring: Secondary | ICD-10-CM | POA: Diagnosis not present

## 2016-09-26 DIAGNOSIS — I4891 Unspecified atrial fibrillation: Secondary | ICD-10-CM

## 2016-09-26 DIAGNOSIS — Z7901 Long term (current) use of anticoagulants: Secondary | ICD-10-CM

## 2016-09-26 LAB — POCT INR: INR: 1.6

## 2016-09-27 ENCOUNTER — Emergency Department (HOSPITAL_COMMUNITY): Payer: Medicare Other

## 2016-09-27 ENCOUNTER — Encounter (HOSPITAL_COMMUNITY): Payer: Self-pay | Admitting: Neurology

## 2016-09-27 ENCOUNTER — Observation Stay (HOSPITAL_COMMUNITY)
Admission: EM | Admit: 2016-09-27 | Discharge: 2016-09-28 | Disposition: A | Discharge status: Home / Self Care | Payer: Medicare Other | Attending: Nephrology | Admitting: Nephrology

## 2016-09-27 DIAGNOSIS — I120 Hypertensive chronic kidney disease with stage 5 chronic kidney disease or end stage renal disease: Secondary | ICD-10-CM | POA: Diagnosis not present

## 2016-09-27 DIAGNOSIS — Z7901 Long term (current) use of anticoagulants: Secondary | ICD-10-CM | POA: Diagnosis not present

## 2016-09-27 DIAGNOSIS — R0602 Shortness of breath: Secondary | ICD-10-CM

## 2016-09-27 DIAGNOSIS — N186 End stage renal disease: Secondary | ICD-10-CM | POA: Diagnosis present

## 2016-09-27 DIAGNOSIS — I10 Essential (primary) hypertension: Secondary | ICD-10-CM | POA: Diagnosis not present

## 2016-09-27 DIAGNOSIS — E784 Other hyperlipidemia: Secondary | ICD-10-CM | POA: Diagnosis not present

## 2016-09-27 DIAGNOSIS — Z8673 Personal history of transient ischemic attack (TIA), and cerebral infarction without residual deficits: Secondary | ICD-10-CM | POA: Insufficient documentation

## 2016-09-27 DIAGNOSIS — E785 Hyperlipidemia, unspecified: Secondary | ICD-10-CM | POA: Diagnosis present

## 2016-09-27 DIAGNOSIS — Z992 Dependence on renal dialysis: Secondary | ICD-10-CM | POA: Insufficient documentation

## 2016-09-27 DIAGNOSIS — J9601 Acute respiratory failure with hypoxia: Secondary | ICD-10-CM

## 2016-09-27 DIAGNOSIS — J9600 Acute respiratory failure, unspecified whether with hypoxia or hypercapnia: Secondary | ICD-10-CM | POA: Diagnosis present

## 2016-09-27 DIAGNOSIS — R0789 Other chest pain: Secondary | ICD-10-CM | POA: Diagnosis not present

## 2016-09-27 DIAGNOSIS — J810 Acute pulmonary edema: Principal | ICD-10-CM | POA: Insufficient documentation

## 2016-09-27 DIAGNOSIS — Z79899 Other long term (current) drug therapy: Secondary | ICD-10-CM | POA: Diagnosis not present

## 2016-09-27 LAB — PROTIME-INR
INR: 1.95
Prothrombin Time: 22.5 seconds — ABNORMAL HIGH (ref 11.4–15.2)

## 2016-09-27 LAB — COMPREHENSIVE METABOLIC PANEL
ALK PHOS: 132 U/L — AB (ref 38–126)
ALT: 25 U/L (ref 14–54)
ANION GAP: 15 (ref 5–15)
AST: 26 U/L (ref 15–41)
Albumin: 4 g/dL (ref 3.5–5.0)
BILIRUBIN TOTAL: 0.6 mg/dL (ref 0.3–1.2)
BUN: 47 mg/dL — ABNORMAL HIGH (ref 6–20)
CO2: 26 mmol/L (ref 22–32)
Calcium: 8.8 mg/dL — ABNORMAL LOW (ref 8.9–10.3)
Chloride: 94 mmol/L — ABNORMAL LOW (ref 101–111)
Creatinine, Ser: 7.11 mg/dL — ABNORMAL HIGH (ref 0.44–1.00)
GFR calc non Af Amer: 4 mL/min — ABNORMAL LOW (ref >60)
GFR, EST AFRICAN AMERICAN: 5 mL/min — AB (ref >60)
GLUCOSE: 106 mg/dL — AB (ref 65–99)
POTASSIUM: 4.8 mmol/L (ref 3.5–5.1)
Sodium: 135 mmol/L (ref 135–145)
Total Protein: 6.7 g/dL (ref 6.5–8.1)

## 2016-09-27 LAB — CBC
HCT: 33.8 % — ABNORMAL LOW (ref 36.0–46.0)
Hemoglobin: 11 g/dL — ABNORMAL LOW (ref 12.0–15.0)
MCH: 31.3 pg (ref 26.0–34.0)
MCHC: 32.5 g/dL (ref 30.0–36.0)
MCV: 96.3 fL (ref 78.0–100.0)
PLATELETS: 150 10*3/uL (ref 150–400)
RBC: 3.51 MIL/uL — ABNORMAL LOW (ref 3.87–5.11)
RDW: 16 % — AB (ref 11.5–15.5)
WBC: 8.1 10*3/uL (ref 4.0–10.5)

## 2016-09-27 LAB — TROPONIN I: TROPONIN I: 0.03 ng/mL — AB (ref <0.03)

## 2016-09-27 LAB — I-STAT TROPONIN, ED: TROPONIN I, POC: 0.03 ng/mL (ref 0.00–0.08)

## 2016-09-27 LAB — PROCALCITONIN: PROCALCITONIN: 0.88 ng/mL

## 2016-09-27 LAB — BRAIN NATRIURETIC PEPTIDE: B Natriuretic Peptide: 1532.2 pg/mL — ABNORMAL HIGH (ref 0.0–100.0)

## 2016-09-27 LAB — HEPATITIS B SURFACE ANTIGEN: HEP B S AG: NEGATIVE

## 2016-09-27 LAB — PHOSPHORUS: Phosphorus: 3.2 mg/dL (ref 2.5–4.6)

## 2016-09-27 LAB — MAGNESIUM: Magnesium: 2.2 mg/dL (ref 1.7–2.4)

## 2016-09-27 LAB — APTT: APTT: 42 s — AB (ref 24–36)

## 2016-09-27 LAB — MRSA PCR SCREENING: MRSA by PCR: NEGATIVE

## 2016-09-27 MED ORDER — ENSURE ENLIVE PO LIQD
237.0000 mL | Freq: Two times a day (BID) | ORAL | Status: DC
  Administered 2016-09-27 – 2016-09-28 (×2): 237 mL via ORAL

## 2016-09-27 MED ORDER — SODIUM CHLORIDE 0.9% FLUSH: 3.0000 mL | INTRAVENOUS | Status: DC | PRN

## 2016-09-27 MED ORDER — ONDANSETRON HCL 4 MG/2ML IJ SOLN: 4.0000 mg | Freq: Four times a day (QID) | INTRAMUSCULAR | Status: DC | PRN

## 2016-09-27 MED ORDER — SODIUM CHLORIDE 0.9 % IV SOLN: 250.0000 mL | INTRAVENOUS | Status: DC | PRN

## 2016-09-27 MED ORDER — NITROGLYCERIN 0.4 MG SL SUBL
0.4000 mg | SUBLINGUAL_TABLET | SUBLINGUAL | Status: DC | PRN
  Administered 2016-09-27 (×2): 0.4 mg via SUBLINGUAL
  Filled 2016-09-27 (×3): qty 1

## 2016-09-27 MED ORDER — AMIODARONE HCL 100 MG PO TABS
100.0000 mg | ORAL_TABLET | Freq: Every day | ORAL | Status: DC
  Administered 2016-09-28: 100 mg via ORAL
  Filled 2016-09-27: qty 1

## 2016-09-27 MED ORDER — ACETAMINOPHEN 325 MG PO TABS
650.0000 mg | ORAL_TABLET | Freq: Four times a day (QID) | ORAL | Status: DC | PRN
  Administered 2016-09-27 (×2): 650 mg via ORAL
  Filled 2016-09-27 (×3): qty 2

## 2016-09-27 MED ORDER — ONDANSETRON HCL 4 MG PO TABS: 4.0000 mg | ORAL_TABLET | Freq: Four times a day (QID) | ORAL | Status: DC | PRN

## 2016-09-27 MED ORDER — ACETAMINOPHEN 650 MG RE SUPP: 650.0000 mg | Freq: Four times a day (QID) | RECTAL | Status: DC | PRN

## 2016-09-27 MED ORDER — GI COCKTAIL ~~LOC~~: 30.0000 mL | Freq: Four times a day (QID) | ORAL | Status: DC | PRN

## 2016-09-27 MED ORDER — ATORVASTATIN CALCIUM 10 MG PO TABS
10.0000 mg | ORAL_TABLET | Freq: Every day | ORAL | Status: DC
  Administered 2016-09-27: 10 mg via ORAL
  Filled 2016-09-27: qty 1

## 2016-09-27 MED ORDER — DOXERCALCIFEROL 4 MCG/2ML IV SOLN
3.0000 ug | INTRAVENOUS | Status: DC
  Filled 2016-09-27: qty 2

## 2016-09-27 MED ORDER — ASPIRIN EC 325 MG PO TBEC
325.0000 mg | DELAYED_RELEASE_TABLET | Freq: Every day | ORAL | Status: DC
  Administered 2016-09-28: 325 mg via ORAL
  Filled 2016-09-27: qty 1

## 2016-09-27 MED ORDER — WARFARIN - PHARMACIST DOSING INPATIENT: Freq: Every day | Status: DC

## 2016-09-27 MED ORDER — METOPROLOL TARTRATE 12.5 MG HALF TABLET
12.5000 mg | ORAL_TABLET | Freq: Two times a day (BID) | ORAL | Status: DC
  Administered 2016-09-28: 12.5 mg via ORAL
  Filled 2016-09-27 (×2): qty 1

## 2016-09-27 MED ORDER — SODIUM CHLORIDE 0.9% FLUSH
3.0000 mL | Freq: Two times a day (BID) | INTRAVENOUS | Status: DC
  Administered 2016-09-27 – 2016-09-28 (×2): 3 mL via INTRAVENOUS

## 2016-09-27 MED ORDER — CINACALCET HCL 30 MG PO TABS
30.0000 mg | ORAL_TABLET | Freq: Every day | ORAL | Status: DC
  Administered 2016-09-27 – 2016-09-28 (×2): 30 mg via ORAL
  Filled 2016-09-27 (×2): qty 1

## 2016-09-27 MED ORDER — RENA-VITE PO TABS
1.0000 | ORAL_TABLET | Freq: Every day | ORAL | Status: DC
  Administered 2016-09-27: 1 via ORAL
  Filled 2016-09-27: qty 1

## 2016-09-27 MED ORDER — WARFARIN SODIUM 2.5 MG PO TABS
2.5000 mg | ORAL_TABLET | Freq: Once | ORAL | Status: AC
  Administered 2016-09-27: 2.5 mg via ORAL
  Filled 2016-09-27 (×2): qty 1

## 2016-09-27 NOTE — Progress Notes (Addendum)
Contacted MD Merrell of pt's chest pain. Vital signs WDL.   Given Nitro and Tylenol.  1530 Reassessed pt- pt is asleep.   Awaiting orders  Paulla Fore, RN

## 2016-09-27 NOTE — ED Notes (Signed)
Pt being transported to x-ray

## 2016-09-27 NOTE — Progress Notes (Signed)
New Admission Note:   Arrival Method: stretcher from ED  Mental Orientation: alert and orientated x 4  Telemetry: box 15  Assessment: Completed Skin: see flow sheet NP:YYFR hand  Pain: chest tightness see MAR  Tubes: none  Safety Measures: Safety Fall Prevention Plan has been given, discussed and signed Admission: pending  [facility] Orientation: Patient has been orientated to the room, unit and staff.  Family:son at bedside   Orders have been reviewed and implemented. Will continue to monitor the patient. Call light has been placed within reach and bed alarm has been activated.   Emilio Math, RN [facility]  Phone number: [phone]

## 2016-09-27 NOTE — H&P (Signed)
History and Physical    SADIRA STANDARD GYK:[id number] DOB: [birth year] DOA: 09/27/2016  PCP: Levin Erp, MD Patient coming from: Dialysis  Chief Complaint: SOB and CP.   HPI: [name] is a 83 y.o. female with medical history significant of anemia, aortic stenosis, hyperlipidemia, ESRD, gout, hypertension, OSA, TIA, orthostatic syncope. Per report patient was in her usual state of health until approximately 03:00 on day of admission. Pt was awake at that time which is not unusual for pt. She was sitting and then developed gradual onset SOB and chest tightness. Tightness worse w/ deep respirations. Denies palpitations, fevers, productive cough, abdominal pain, dysuria, frequency, neck stiffness, focal neurological deficit, headache, LOC, dizziness. Patient versus occasional nonproductive cough which sounds wet. Symptoms were worse with exertion. Patient went to her typical scheduled dialysis but was never started as staff at the dialysis center called EMS to evaluate patient. EMS arrived and given 324 mg of ASA and some legal nitroglycerin 2 with resolution of chest tightness. Placed patient on CPAP with marked improvement in overall shortness of breath.  ED Course: Placed on 4 L nasal cannula in the ED. Objective findings outlined below.  Review of Systems: As per HPI otherwise all other systems reviewed and are negative  Ambulatory Status: Limited secondary to age-related physical deconditioning.  Past Medical History:  Diagnosis Date  . Anemia   . Aortic stenosis   . Benign bladder mass   . Chronic cough   . DJD (degenerative joint disease)   . Dyslipidemia   . ESRD on hemodialysis ([place])   . Gout   . HTN (hypertension)   . Obstructive sleep apnea    mild  . Orthostatic syncope    around year 2006  . Pneumonia   . TIA (transient ischemic attack)     Past Surgical History:  Procedure Laterality Date  . BONE MARROW BIOPSY    . CATARACT EXTRACTION W/ INTRAOCULAR LENS   IMPLANT, BILATERAL    . COLONOSCOPY W/ BIOPSIES AND POLYPECTOMY    . left arm dialysis graft    . NEPHRECTOMY     left  . REVISION OF ARTERIOVENOUS GORETEX GRAFT Right 11/24/2014   Procedure: REPLACEMENT OF  MEDIAL SIDE OF RIGHT FORARM ARTERIOVENOUS GORETEX GRAFT;  Surgeon: Conrad Lakewood Club, MD;  Location: [facility];  Service: Vascular;  Laterality: Right;  . REVISION OF ARTERIOVENOUS GORETEX GRAFT Right 11/28/2015   Procedure: REVISION OF RIGHT ARM ARTERIOVENOUS GORETEX GRAFT;  Surgeon: Serafina Mitchell, MD;  Location: [facility];  Service: Vascular;  Laterality: Right;  . TOTAL ABDOMINAL HYSTERECTOMY      Social History   Social History  . Marital status: Widowed    Spouse name: N/A  . Number of children: N/A  . Years of education: N/A   Occupational History  . Retired     Secretary/administrator   Social History Main Topics  . Smoking status: Never Smoker  . Smokeless tobacco: Never Used  . Alcohol use No  . Drug use: No  . Sexual activity: No   Other Topics Concern  . Not on file   Social History Narrative  . No narrative on file    Allergies  Allergen Reactions  . Tape Other (See Comments)    Pulls off skin  . Sulfa Antibiotics Other (See Comments)    Bumps all over body    Family History  Problem Relation Age of Onset  . Hypertension Mother   . Diabetes Mother   . Hypertension Other  Prior to Admission medications   Medication Sig Start Date End Date Taking? Authorizing Provider  acetaminophen (TYLENOL) 500 MG tablet Take 1,000 mg by mouth every 6 (six) hours as needed (pain).     [provider]  amiodarone (PACERONE) 200 MG tablet Take 0.5 tablets (100 mg total) by mouth daily. 09/07/16   Erlene Quan, PA-C  atorvastatin (LIPITOR) 10 MG tablet Take 10 mg by mouth at bedtime.      [provider]  metoprolol tartrate (LOPRESSOR) 25 MG tablet Take 0.5 tablets (12.5 mg total) by mouth 2 (two) times daily. 08/25/16   Rosita Fire, MD  multivitamin  (RENA-VIT) TABS tablet Take 1 tablet by mouth daily.    [provider]  SENSIPAR 30 MG tablet Take 30 mg by mouth daily.  02/14/13   [provider]  warfarin (COUMADIN) 2.5 MG tablet Take 1 tablet by mouth daily or as directed by coumadin clinic 09/07/16   Erlene Quan, PA-C    Physical Exam: Vitals:   09/27/16 1243 09/27/16 1300 09/27/16 1350 09/27/16 1400  BP:  (!) 159/47 (!) 139/46 (!) 139/46  Pulse:  (!) 55 (!) 54 (!) 56  Resp:  20 20 16   Temp:      TempSrc:      SpO2: 96% 100% 96% 97%  Weight:         General:  Appears calm and comfortable Eyes:  PERRL, EOMI, normal lids, iris ENT:  grossly normal hearing, lips & tongue, mmm Neck:  no LAD, masses or thyromegaly Cardiovascular:  RRR, IV/VI systolic murmur, no LE extremity edema Respiratory: Increased effort. Diminished breath sounds throughout with crackles in the bases. Occasional wet nonproductive cough.  Abdomen:  soft, ntnd, NABS Skin:  no rash or induration seen on limited exam Musculoskeletal:  grossly normal tone BUE/BLE, good ROM, no bony abnormality Psychiatric:  grossly normal mood and affect, speech fluent and appropriate, AOx3 Neurologic:  CN 2-12 grossly intact, moves all extremities in coordinated fashion, sensation intact  Labs on Admission: I have personally reviewed following labs and imaging studies  CBC: No results for input(s): WBC, NEUTROABS, HGB, HCT, MCV, PLT in the last 168 hours. Basic Metabolic Panel:  Recent Labs Lab 09/27/16 1125  NA 135  K 4.8  CL 94*  CO2 26  GLUCOSE 106*  BUN 47*  CREATININE 7.11*  CALCIUM 8.8*   GFR: Estimated Creatinine Clearance: 4.4 mL/min (A) (by C-G formula based on SCr of 7.11 mg/dL (H)). Liver Function Tests:  Recent Labs Lab 09/27/16 1125  AST 26  ALT 25  ALKPHOS 132*  BILITOT 0.6  PROT 6.7  ALBUMIN 4.0   No results for input(s): LIPASE, AMYLASE in the last 168 hours. No results for input(s): AMMONIA in the last 168  hours. Coagulation Profile:  Recent Labs Lab 09/26/16 1154 09/27/16 1214  INR 1.6 1.95   Cardiac Enzymes: No results for input(s): CKTOTAL, CKMB, CKMBINDEX, TROPONINI in the last 168 hours. BNP (last 3 results) No results for input(s): PROBNP in the last 8760 hours. HbA1C: No results for input(s): HGBA1C in the last 72 hours. CBG: No results for input(s): GLUCAP in the last 168 hours. Lipid Profile: No results for input(s): CHOL, HDL, LDLCALC, TRIG, CHOLHDL, LDLDIRECT in the last 72 hours. Thyroid Function Tests: No results for input(s): TSH, T4TOTAL, FREET4, T3FREE, THYROIDAB in the last 72 hours. Anemia Panel: No results for input(s): VITAMINB12, FOLATE, FERRITIN, TIBC, IRON, RETICCTPCT in the last 72 hours. Urine analysis: No  results found for: COLORURINE, APPEARANCEUR, LABSPEC, PHURINE, GLUCOSEU, HGBUR, BILIRUBINUR, KETONESUR, PROTEINUR, UROBILINOGEN, NITRITE, LEUKOCYTESUR  Creatinine Clearance: Estimated Creatinine Clearance: 4.4 mL/min (A) (by C-G formula based on SCr of 7.11 mg/dL (H)).  Sepsis Labs: @LABRCNTIP (procalcitonin:4,lacticidven:4) )No results found for this or any previous visit (from the past 240 hour(s)).   Radiological Exams on Admission: Dg Chest 2 View  Result Date: 09/27/2016 CLINICAL DATA:  Chest pain and shortness of breath. EXAM: CHEST  2 VIEW COMPARISON:  08/20/2016 FINDINGS: The cardiac silhouette is mildly enlarged. Aortic atherosclerosis is again noted. The lungs are hypoinflated with diffusely increased interstitial markings bilaterally, greatest at the bases. There small bilateral pleural effusions. No pneumothorax or acute osseous abnormality is seen. IMPRESSION: New interstitial opacities compatible with edema. Small pleural effusions. Electronically Signed   By: Logan Bores M.D.   On: 09/27/2016 12:26    EKG: Independently reviewed. Right bundle branch block, sinus. No ACS. Similar to previous.  Assessment/Plan Active Problems:   ESRD  (end stage renal disease) (HCC)   HLD (hyperlipidemia)   Acute respiratory failure (HCC)   Atypical chest pain   Essential hypertension   Acute respiratory failure: Reportedly required 4 L nasal cannula at initial presentation in order to maintain O2 saturations above 90%. Currently patient on room air at 95%. Likely secondary to fluid overload. Doubt infectious etiology. - Dialysis per nephrology - Pro calcitonin - O2 when necessary  ESRD, dialysis: Patient missed dialysis on day of admission due to being sent to the emergency room. - Dialysis per nephrology - continue renal vitamins - CMET, Mag, phos.  Chest pain: ACS versus fluid overload and cardiac strain from ESRD. Initially symptoms improved with aspirin and nitroglycerin. EKG was without overt signs of ACS. Patient undoubtedly has coronary artery disease but has never had a cardiac catheterization. Nuclear medicine study from 2006 without evidence of ischemia. - Telemetry - Cycle troponins - EKG in a.m. - ASA and nitroglycerin PRN  Afib: rate controlled.  - continue coumadin, metop, amiodarone  HLD: - continue statin  HTN: - continue metop  DVT prophylaxis: coumadin  Code Status: full  Family Communication: grandson  Disposition Plan: pending dialysis and improvement in respiratory status and ACS r/o  Consults called: nephrology  Admission status: observation    MERRELL, DAVID J MD [facility]  If 7PM-7AM, please contact night-coverage [email or web address] Password [email or web address]  09/27/2016, 2:08 PM

## 2016-09-27 NOTE — ED Provider Notes (Addendum)
Pt presents to the ED from dialysis with chest pain and shortness of breath.  Pt states her chest hurts when she coughs and takes a deep breath.  She denies any fevers.  Pt is feeling better now after EMS treatment.  Vitals are stable.  Lungs are clear.  Will proceed with further cardiac eval.  Suspect volume overload.  Doubt acute coronary syndrome.  Admitted for dialysis and serial cardiac eval  Medical screening examination/treatment/procedure(s) were conducted as a shared visit with non-physician practitioner(s) and myself.  I personally evaluated the patient during the encounter.     Dorie Rank, MD 09/28/16 Wynelle Fanny    Dorie Rank, MD 09/29/16 [phone]

## 2016-09-27 NOTE — Procedures (Signed)
I have personally attended this patient's dialysis session.   2K bath R AVG 400 UF goal 3 L BP stable so far  Jamal Maes, MD Ulen Pager 09/27/2016, 6:11 PM

## 2016-09-27 NOTE — ED Triage Notes (Signed)
Per ems- pt comes dialysis where she presented with SOB, respiratory distress, CP. EMS arrived she was on 4 L [place] 95%. She was placed on CPAP based on presentation of respiratory distress. Given 324 aspirin, 2 SL nitro. Now only c/o cp when coughing. Last dialysis was on Saturday. Is a x 4. Is 100 % 2L.

## 2016-09-27 NOTE — ED Notes (Signed)
Pt advised staff she does not make urine. EDP will be made aware regarding urine sample.

## 2016-09-27 NOTE — ED Provider Notes (Signed)
[facility] DEPT Provider Note   CSN: [id number] Arrival date & time: 09/27/16  1116     History   Chief Complaint Chief Complaint  Patient presents with  . Shortness of Breath  . Chest Pain    HPI [name] is a 83 y.o. female.  HPI   [name] is a 83 y.o. female, with a history of Anemia, ESRD on dialysis (Tuesday, Thursday, and Saturday), HTN, and TIA, presenting to the ED with Chest pain and shortness of breath that began around 3 AM this morning. Patient states she was already awake, but sitting down when her complaint began. She arrived at dialysis this morning, was noted to be short of breath, and EMS was called. She did not receive dialysis this morning. Last dialysis was Saturday, May 19. Patient complains of central and right-sided chest pain, worse with deep breathing, severe, vague description. She states she has not had this issue before. She endorses a baseline cough with clear sputum which she does not think has changed recently.  EMS reports patient was on 4 L of oxygen via nasal cannula and SPO2 at 95%. They report crackles in lung bases and patient was placed on CPAP. Patient seemed to improve with CPAP with saturations 100%. Received 342m ASA and 2x SL NTG prior to arrival.  Denies N/V/D, fever/chills, abdominal pain, recent illness, or any other complaints.  Ne[facility]rologist[facility]   Past Medical History:  Diagnosis Date  . Anemia   . Aortic stenosis   . Benign bladder mass   . Chronic cough   . DJD (degenerative joint disease)   . Dyslipidemia   . [place]D on hemodialysis ([place]   . Gout   . HTN (hypertension)   . Obstructive sleep apnea    mild  . Orthostatic syncope    around year 2006  . Pneumonia   . TIA (transient ischemic attack)     Patient Active Problem List   Diagnosis Date Noted  . Acute respiratory failure ([facility] 09/27/2016  . Coumadin Rx 09/07/2016  . Monitoring  for long-term anticoagulant use 08/29/2016  . Atrial flutter ([facility]   . New onset atrial fibrillation ([facility] 08/22/2016  . Atrial fibrillation with RVR ([facility] 08/20/2016  . Severe aortic stenosis 08/20/2016  . New onset a-fib ([facility] 08/20/2016  . Staphylococcus aureus bacteremia   . 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 Bleeding from dialysis shunt ([facility] [place]   . Nausea   . Fever in adult 11/27/2015  . Hemorrhage of arteriovenous graft ([facility] 11/27/2015  . HLD (hyperlipidemia) 11/27/2015  . Bleeding 11/27/2015  . Influenza A with respiratory manifestations 05/08/2013  . Cough 05/08/2013  . Chronic cough 02/15/2013  . Secondary hyperparathyroidism ([facility] 11/21/2012  . ESRD (end stage renal disease) ([facility] 11/20/2012    Past Surgical History:  Procedure Laterality Date  . BONE MARROW BIOPSY    . CATARACT EXTRACTION W/ INTRAOCULAR LENS  IMPLANT, BILATERAL    . COLONOSCOPY W/ BIOPSIES AND POLYPECTOMY    . left arm dialysis graft    . NEPHRECTOMY     left  . REVISION OF ARTERIOVENOUS GORETEX GRAFT Right 11/24/2014   Procedure: REPLACEMENT OF  MEDIAL SIDE OF RIGHT FORARM ARTERIOVENOUS GORETEX GRAFT;  Surgeon: BConrad [place]onerstown MD;  [facility]  Service: [facility];  Laterality: Right;  . REVISION OF ARTERIOVENOUS GORETEX GRAFT Right 11/28/2015   Procedure: REVISION OF RIGHT ARM ARTERIOVENOUS GORETEX GRAFT;  Surgeon: VDurene Fruits  Pierre Bali, MD;  Location: [facility];  Service: Vascular;  Laterality: Right;  . TOTAL ABDOMINAL HYSTERECTOMY      OB History    No data available       Home Medications    Prior to Admission medications   Medication Sig Start Date End Date Taking? Authorizing Provider  amiodarone (PACERONE) 200 MG tablet Take 0.5 tablets (100 mg total) by mouth daily. 09/07/16  Yes Kilroy, Luke K, PA-C  atorvastatin (LIPITOR) 10 MG tablet Take 10 mg by mouth at bedtime.     Yes [provider]  metoprolol tartrate (LOPRESSOR) 25 MG  tablet Take 0.5 tablets (12.5 mg total) by mouth 2 (two) times daily. 08/25/16  Yes Rosita Fire, MD  multivitamin (RENA-VIT) TABS tablet Take 1 tablet by mouth daily.   Yes [provider]  SENSIPAR 30 MG tablet Take 30 mg by mouth daily.  02/14/13  Yes [provider]  warfarin (COUMADIN) 2.5 MG tablet Take 1 tablet by mouth daily or as directed by coumadin clinic Patient taking differently: Take 2.5-3.75 mg by mouth See admin instructions. Pt takes 2.23m on Tu, Thu, Sat, Sun - takes 3.787mon M, W, F 09/07/16  Yes KiErlene QuanPA-C    Family History Family History  Problem Relation Age of Onset  . Hypertension Mother   . Diabetes Mother   . Hypertension Other     Social History Social History  Substance Use Topics  . Smoking status: Never Smoker  . Smokeless tobacco: Never Used  . Alcohol use No     Allergies   Tape and Sulfa antibiotics   Review of Systems Review of Systems  Constitutional: Negative for chills, diaphoresis and fever.  Respiratory: Positive for cough and shortness of breath.   Cardiovascular: Positive for chest pain. Negative for leg swelling.  Gastrointestinal: Negative for abdominal pain, diarrhea, nausea and vomiting.  All other systems reviewed and are negative.    Physical Exam Updated Vital Signs BP (!) 139/55 (BP Location: Left Arm)   Pulse (!) 59   Temp 99.8 F (37.7 C) (Oral)   Resp 16   Wt 56.7 kg (125 lb)   SpO2 100%   BMI 20.80 kg/m   Physical Exam  Constitutional: She appears well-developed and well-nourished. No distress.  HENT:  Head: Normocephalic and atraumatic.  Eyes: Conjunctivae are normal.  Neck: Neck supple.  Cardiovascular: Normal rate, regular rhythm, normal heart sounds and intact distal pulses.   Pulmonary/Chest: She has decreased breath sounds in the right lower field and the left lower field.  Patient arrives with CPAP in place. No increased work of breathing noted. CPAP removed and  patient placed on 4 lpm [place]. She is able to speak in full sentences.  Abdominal: Soft. There is no tenderness. There is no guarding.  Musculoskeletal: She exhibits no edema.  Lymphadenopathy:    She has no cervical adenopathy.  Neurological: She is alert.  Skin: Skin is warm and dry. She is not diaphoretic.  Psychiatric: She has a normal mood and affect. Her behavior is normal.  Nursing note and vitals reviewed.    ED Treatments / Results  Labs (all labs ordered are listed, but only abnormal results are displayed) Labs Reviewed  BRAIN NATRIURETIC PEPTIDE - Abnormal; Notable for the following:       Result Value   B Natriuretic Peptide 1,532.2 (*)    All other components within normal limits  COMPREHENSIVE METABOLIC PANEL - Abnormal; Notable for the  following:    Chloride 94 (*)    Glucose, Bld 106 (*)    BUN 47 (*)    Creatinine, Ser 7.11 (*)    Calcium 8.8 (*)    Alkaline Phosphatase 132 (*)    GFR calc non Af Amer 4 (*)    GFR calc Af Amer 5 (*)    All other components within normal limits  PROTIME-INR - Abnormal; Notable for the following:    Prothrombin Time 22.5 (*)    All other components within normal limits  APTT - Abnormal; Notable for the following:    aPTT 42 (*)    All other components within normal limits  CBC WITH DIFFERENTIAL/PLATELET  Randolm Idol, ED    EKG  EKG Interpretation None       Radiology Dg Chest 2 View  Result Date: 09/27/2016 CLINICAL DATA:  Chest pain and shortness of breath. EXAM: CHEST  2 VIEW COMPARISON:  08/20/2016 FINDINGS: The cardiac silhouette is mildly enlarged. Aortic atherosclerosis is again noted. The lungs are hypoinflated with diffusely increased interstitial markings bilaterally, greatest at the bases. There small bilateral pleural effusions. No pneumothorax or acute osseous abnormality is seen. IMPRESSION: New interstitial opacities compatible with edema. Small pleural effusions. Electronically Signed   By: Logan Bores M.D.   On: 09/27/2016 12:26    Procedures Procedures (including critical care time)  Medications Ordered in ED Medications - No data to display   Initial Impression / Assessment and Plan / ED Course  I have reviewed the triage vital signs and the nursing notes.  Pertinent labs & imaging results that were available during my care of the patient were reviewed by me and considered in my medical decision making (see chart for details).  Clinical Course as of Sep 28 1402  Tue Sep 27, 2016  1241 Patient states her chest pain and shortness of breath have improved. Pain is now 5/10 and she describes it as "stinging." SPO2 100% on 4 lpm.  [SJ]  1304 Spoke with Dr. Marily Memos, hospitalist, who agrees to admit the patient.  [SJ]  1341 Spoke with Dr. Lorrene Reid, nephrologist, who states there will a delay in dialyzing patient due to volume of patients until possibly this evening. Keep patient on O2 for respiratory support.  [SJ]  37 Dr. Marily Memos updated.  [SJ]    Clinical Course User Index [SJ] Jozsef Wescoat C, PA-C     Patient presents with chest pain and shortness of breath with evidence of pulmonary edema on chest x-ray. Likely due to patient needing dialysis. Lungs clear upon reevaluation. Admitted and awaiting dialysis.      Findings and plan of care discussed with Dorie Rank, MD. Dr. Tomi Bamberger personally evaluated and examined this patient.   Cardiac echo July 2017 impression: Mild basal septal LV hypertrophy with LVEF 60-65%. Grade 2   diastolic dysfunction with increased LV filling pressure. Mild to   moderate left atrial enlargement. MAC with mildly calcified   mitral leaflets and trivial mitral regurgitation. Severe calcific   aortic stenosis as outlined above with mild aortic regurgitation.   Mild tricuspid regurgitation with evidence of severe pulmonary   hypertension, PA S/P estimated 62 mmHg. Trivial posterior   pericardial effusion. No definitive valvular vegetations  noted.  Vitals:   09/27/16 1118 09/27/16 1122 09/27/16 1243 09/27/16 1350  BP:  (!) 139/55  (!) 139/46  Pulse:  (!) 59  (!) 54  Resp:  16  20  Temp: 99.8 F (37.7 C)  TempSrc: Oral     SpO2:  100% 96% 96%  Weight: 56.7 kg (125 lb)        Final Clinical Impressions(s) / ED Diagnoses   Final diagnoses:  Acute pulmonary edema (HCC)  Shortness of breath  Other chest pain    New Prescriptions New Prescriptions   No medications on file     Lorayne Bender, PA-C 09/27/16 1404

## 2016-09-27 NOTE — Consult Note (Addendum)
[facility] Renal Consultation Note    Indication for Consultation:  Management of ESRD/hemodialysis; anemia, hypertension/volume and secondary hyperparathyroidism  HPI: [name] is a 83 y.o. female with ESRD secondary IgA myeloma. PMH HTN, severe AS, TIA. Recent [facility] admit 08/2016 with new onset Afib/flutter started on Coumadin. Currently admitted under observation status for chest pain that began this morning prior to dialysis treatment. Treated with nitroglycerin and ASA. Initially requiring supplemental oxygen on ED arrival with concern for fluid overload.    Seen at bedside currently. BP 124/49 HR 57 RR 16  SpO2 96% on RA. CXR with small pleural effusions. Chest pain improved since admission but still reports some soreness when taking deep breaths. She reports productive cough for "a long time". Denies fever, chills, nausea, vomiting.   Dialyzes at [facility]. Missed HD today with acute chest pain. Last HD Saturday 5/18. Usually compliant with treatments with no noted issues on HD.    Past Medical History:  Diagnosis Date  . Anemia   . Aortic stenosis   . Benign bladder mass   . Chronic cough   . DJD (degenerative joint disease)   . Dyslipidemia   . ESRD on hemodialysis ([place])   . Gout   . HTN (hypertension)   . Obstructive sleep apnea    mild  . Orthostatic syncope    around year 2006  . Pneumonia   . TIA (transient ischemic attack)    Past Surgical History:  Procedure Laterality Date  . BONE MARROW BIOPSY    . CATARACT EXTRACTION W/ INTRAOCULAR LENS  IMPLANT, BILATERAL    . COLONOSCOPY W/ BIOPSIES AND POLYPECTOMY    . left arm dialysis graft    . NEPHRECTOMY     left  . REVISION OF ARTERIOVENOUS GORETEX GRAFT Right 11/24/2014   Procedure: REPLACEMENT OF  MEDIAL SIDE OF RIGHT FORARM ARTERIOVENOUS GORETEX GRAFT;  Surgeon: Conrad [place], MD;  Location: [place];  Service: Vascular;  Laterality: Right;  . REVISION OF ARTERIOVENOUS GORETEX  GRAFT Right 11/28/2015   Procedure: REVISION OF RIGHT ARM ARTERIOVENOUS GORETEX GRAFT;  Surgeon: Serafina Mitchell, MD;  Location: [place];  Service: Vascular;  Laterality: Right;  . TOTAL ABDOMINAL HYSTERECTOMY     Family History  Problem Relation Age of Onset  . Hypertension Mother   . Diabetes Mother   . Hypertension Other    Social History:  reports that she has never smoked. She has never used smokeless tobacco. She reports that she does not drink alcohol or use drugs. Allergies  Allergen Reactions  . Tape Other (See Comments)    Pulls off skin  . Sulfa Antibiotics Other (See Comments)    Bumps all over body   Prior to Admission medications   Medication Sig Start Date End Date Taking? Authorizing Provider  amiodarone (PACERONE) 200 MG tablet Take 0.5 tablets (100 mg total) by mouth daily. 09/07/16  Yes Kilroy, Luke K, PA-C  atorvastatin (LIPITOR) 10 MG tablet Take 10 mg by mouth at bedtime.     Yes [provider]  metoprolol tartrate (LOPRESSOR) 25 MG tablet Take 0.5 tablets (12.5 mg total) by mouth 2 (two) times daily. 08/25/16  Yes Rosita Fire, MD  multivitamin (RENA-VIT) TABS tablet Take 1 tablet by mouth daily.   Yes [provider]  SENSIPAR 30 MG tablet Take 30 mg by mouth daily.  02/14/13  Yes [provider]  warfarin (COUMADIN) 2.5 MG tablet Take 1 tablet by mouth  daily or as directed by coumadin clinic Patient taking differently: Take 2.5-3.75 mg by mouth See admin instructions. Pt takes 2.67m on Tu, Thu, Sat, Sun - takes 3.764mon M, W, F 09/07/16  Yes KiErlene QuanPA-C   Current Facility-Administered Medications  Medication Dose Route Frequency Provider Last Rate Last Dose  . 0.9 %  sodium chloride infusion  250 mL Intravenous PRN MeWaldemar DickensMD      . acetaminophen (TYLENOL) tablet 650 mg  650 mg Oral Q6H PRN MeWaldemar DickensMD   650 mg at 09/27/16 1458   Or  . acetaminophen (TYLENOL) suppository 650 mg  650 mg Rectal Q6H PRN  MeWaldemar DickensMD      . amiodarone (PACERONE) tablet 100 mg  100 mg Oral Daily MeWaldemar DickensMD      . [SDerrill MemoN 09/28/2016] aspirin EC tablet 325 mg  325 mg Oral Daily MeWaldemar DickensMD      . atorvastatin (LIPITOR) tablet 10 mg  10 mg Oral QHS MeWaldemar DickensMD      . cinacalcet (SL[facility] Hospitaltablet 30 mg  30 mg Oral Q breakfast MeWaldemar DickensMD      . gi cocktail (Maalox,Lidocaine,Donnatal)  30 mL Oral QID PRN MeWaldemar DickensMD      . metoprolol tartrate (LOPRESSOR) tablet 12.5 mg  12.5 mg Oral BID MeWaldemar DickensMD      . multivitamin (RENA-VIT) tablet 1 tablet  1 tablet Oral QHS MeWaldemar DickensMD      . nitroGLYCERIN (NITROSTAT) SL tablet 0.4 mg  0.4 mg Sublingual Q5 min PRN MeWaldemar DickensMD   0.4 mg at 09/27/16 1458  . ondansetron (ZOFRAN) tablet 4 mg  4 mg Oral Q6H PRN MeWaldemar DickensMD       Or  . ondansetron (ZBear [facility] Memorial Hospitalinjection 4 mg  4 mg Intravenous Q6H PRN MeWaldemar DickensMD      . sodium chloride flush (NS) 0.9 % injection 3 mL  3 mL Intravenous Q12H MeWaldemar DickensMD      . sodium chloride flush (NS) 0.9 % injection 3 mL  3 mL Intravenous PRN MeWaldemar DickensMD      . warfarin (COUMADIN) tablet 2.5 mg  2.5 mg Oral ONCE-1800 MeWaldemar DickensMD      . Warfarin - Pharmacist Dosing Inpatient   Does not apply [id number] DickensMD        ROS: As per HPI otherwise negative.  Physical Exam: Vitals:   09/27/16 1400 09/27/16 1427 09/27/16 1457 09/27/16 1505  BP: (!) 139/46 (!) 148/52 (!) 135/40 (!) 124/49  Pulse: (!) 56 (!) 57 76 (!) 57  Resp: 16 16    Temp:  99.1 F (37.3 C)    TempSrc:  Oral    SpO2: 97% 96%    Weight:      Height:    5' 5"  (1.651 m)     General: WDWN  Elderly female NAD Head: NCAT sclera not icteric MMM Neck: Supple. No JVD No masses Lungs:  Breathing is unlabored. Diminished breath sounds bilat faint crackles at bases  Heart: RRR with S1 S2 2/6 systolic murmur LSB No diastolic murmur or rub noted. Abdomen: soft  NT + BS Lower extremities:without edema or ischemic changes, no open wounds  Neuro: A & O  X 3. Moves all extremities spontaneously. Psych:  Responds to questions appropriately with a normal  affect. Dialysis Access: RUE AVG +bruit    Recent Labs Lab 09/27/16 1125 09/27/16 1434  NA 135  --   K 4.8  --   CL 94*  --   CO2 26  --   GLUCOSE 106*  --   BUN 47*  --   CREATININE 7.11*  --   CALCIUM 8.8*  --   PHOS  --  3.2   Liver Function Tests:  Recent Labs Lab 09/27/16 1125  AST 26  ALT 25  ALKPHOS 132*  BILITOT 0.6  PROT 6.7  ALBUMIN 4.0    Recent Labs Lab 09/27/16 1434  TROPONINI <0.03   Studies/Results: Dg Chest 2 View  Result Date: 09/27/2016 CLINICAL DATA:  Chest pain and shortness of breath. EXAM: CHEST  2 VIEW COMPARISON:  08/20/2016 FINDINGS: The cardiac silhouette is mildly enlarged. Aortic atherosclerosis is again noted. The lungs are hypoinflated with diffusely increased interstitial markings bilaterally, greatest at the bases. There small bilateral pleural effusions. No pneumothorax or acute osseous abnormality is seen. IMPRESSION: New interstitial opacities compatible with edema. Small pleural effusions. Electronically Signed   By: Logan Bores M.D.   On: 09/27/2016 12:26    Dialysis Orders:  [place] 3.5h 160F BFR 400/A1.5x 2K/2.25 Ca Na Linear UF Prof 4 EDW 55.5 kg  R AVG Heparin 4000 U IV Bolus -Hectorol 3 mcg IV q HD -Mircera 60 mcg IV q 2 weeks (last dose 5/15)   Assessment/Plan: 1.  Chest pain - ACS vs volume  per primary team 2.  ESRD -  K 4.8 TTS - For HD today  3.  Hypertension/volume  - BP controlled/ CXR with small pleural effusions - UF to EDW  4.  Anemia  - Last OP hgb 11.7 Follow on ESA  5.  Metabolic bone disease -  Ca 8.8 OP Phos 3.7 No binders - Cont VDRA/Sensipar  6. Afib/flutter - controlled on Coumadin - per pharmacy  7.  Nutrition - Renal diet/vitamins   Lynnda Child [facility] Pager  [phone] 09/27/2016, 4:36 PM   I have seen and examined this patient and agree with plan and assessment in the above note with renal recommendations/intervention highlighted. "Just hurts to breathe". HD today. Other plans per primary service.  My Madariaga B,MD 09/27/2016 6:08 PM

## 2016-09-27 NOTE — Progress Notes (Signed)
ANTICOAGULATION CONSULT NOTE - Initial Consult  Pharmacy Consult for warfarin Indication: atrial fibrillation  Allergies  Allergen Reactions  . Tape Other (See Comments)    Pulls off skin  . Sulfa Antibiotics Other (See Comments)    Bumps all over body    Patient Measurements: Weight: 125 lb (56.7 kg)  Vital Signs: Temp: 99.8 F (37.7 C) (05/22 1118) Temp Source: Oral (05/22 1118) BP: 139/46 (05/22 1400) Pulse Rate: 56 (05/22 1400)  Labs:  Recent Labs  09/26/16 1154 09/27/16 1125 09/27/16 1214  APTT  --   --  42*  LABPROT  --   --  22.5*  INR 1.6  --  1.95  CREATININE  --  7.11*  --     Estimated Creatinine Clearance: 4.4 mL/min (A) (by C-G formula based on SCr of 7.11 mg/dL (H)).   Medical History: Past Medical History:  Diagnosis Date  . Anemia   . Aortic stenosis   . Benign bladder mass   . Chronic cough   . DJD (degenerative joint disease)   . Dyslipidemia   . ESRD on hemodialysis ([place])   . Gout   . HTN (hypertension)   . Obstructive sleep apnea    mild  . Orthostatic syncope    around year 2006  . Pneumonia   . TIA (transient ischemic attack)     Assessment: [name] on PTA warfarin for afib. INR on admission slightly subtherapeutic at 1.95, last dose yesterday. No CBC, no bleeding noted  PTA regimen: 2.5mg  daily except 3.75mg  MWF  Goal of Therapy:  INR 2-3 Monitor platelets by anticoagulation protocol: Yes   Plan:  Warfarin 2.5mg  PO x1 Daily INR Monitor CBC, s/sx of bleeding   [name], PharmD PGY1 Pharmacy Resident Rx ED [phone] 09/27/2016 2:20 PM

## 2016-09-28 DIAGNOSIS — J810 Acute pulmonary edema: Secondary | ICD-10-CM

## 2016-09-28 DIAGNOSIS — I120 Hypertensive chronic kidney disease with stage 5 chronic kidney disease or end stage renal disease: Secondary | ICD-10-CM | POA: Diagnosis not present

## 2016-09-28 DIAGNOSIS — R0789 Other chest pain: Secondary | ICD-10-CM | POA: Diagnosis not present

## 2016-09-28 DIAGNOSIS — N186 End stage renal disease: Secondary | ICD-10-CM | POA: Diagnosis not present

## 2016-09-28 LAB — CBC
HEMATOCRIT: 34.2 % — AB (ref 36.0–46.0)
Hemoglobin: 11 g/dL — ABNORMAL LOW (ref 12.0–15.0)
MCH: 31.2 pg (ref 26.0–34.0)
MCHC: 32.2 g/dL (ref 30.0–36.0)
MCV: 96.9 fL (ref 78.0–100.0)
PLATELETS: 130 10*3/uL — AB (ref 150–400)
RBC: 3.53 MIL/uL — AB (ref 3.87–5.11)
RDW: 16.2 % — AB (ref 11.5–15.5)
WBC: 9.5 10*3/uL (ref 4.0–10.5)

## 2016-09-28 LAB — COMPREHENSIVE METABOLIC PANEL
ALBUMIN: 3.5 g/dL (ref 3.5–5.0)
ALT: 24 U/L (ref 14–54)
AST: 23 U/L (ref 15–41)
Alkaline Phosphatase: 114 U/L (ref 38–126)
Anion gap: 14 (ref 5–15)
BILIRUBIN TOTAL: 0.5 mg/dL (ref 0.3–1.2)
BUN: 18 mg/dL (ref 6–20)
CHLORIDE: 93 mmol/L — AB (ref 101–111)
CO2: 26 mmol/L (ref 22–32)
CREATININE: 3.9 mg/dL — AB (ref 0.44–1.00)
Calcium: 8.6 mg/dL — ABNORMAL LOW (ref 8.9–10.3)
GFR calc Af Amer: 11 mL/min — ABNORMAL LOW (ref >60)
GFR, EST NON AFRICAN AMERICAN: 9 mL/min — AB (ref >60)
Glucose, Bld: 135 mg/dL — ABNORMAL HIGH (ref 65–99)
POTASSIUM: 4 mmol/L (ref 3.5–5.1)
Sodium: 133 mmol/L — ABNORMAL LOW (ref 135–145)
Total Protein: 6.1 g/dL — ABNORMAL LOW (ref 6.5–8.1)

## 2016-09-28 LAB — PROTIME-INR
INR: 2.65
PROTHROMBIN TIME: 28.8 s — AB (ref 11.4–15.2)

## 2016-09-28 LAB — TROPONIN I

## 2016-09-28 MED ORDER — WARFARIN SODIUM 2.5 MG PO TABS: 2.5000 mg | ORAL_TABLET | Freq: Once | ORAL | Status: DC

## 2016-09-28 NOTE — Progress Notes (Signed)
ANTICOAGULATION CONSULT NOTE - Follow Up Consult  Pharmacy Consult for warfarin Indication: atrial fibrillation  Allergies  Allergen Reactions  . Tape Other (See Comments)    Pulls off skin  . Sulfa Antibiotics Other (See Comments)    Bumps all over body    Patient Measurements: Height: 5\' 5"  (165.1 cm) Weight: 123 lb 3.8 oz (55.9 kg) IBW/kg (Calculated) : 57  Vital Signs: Temp: 98.5 F (36.9 C) (05/23 0516) Temp Source: Oral (05/23 0516) BP: 106/39 (05/23 0516) Pulse Rate: Barbara (05/23 0516)  Labs:  Recent Labs  09/26/16 1154 09/27/16 1125 09/27/16 1214 09/27/16 1434 09/27/16 1835 09/27/16 2244 09/28/16 0210  HGB  --   --   --   --  11.0*  --  11.0*  HCT  --   --   --   --  33.8*  --  34.2*  PLT  --   --   --   --  150  --  130*  APTT  --   --  42*  --   --   --   --   LABPROT  --   --  22.5*  --   --   --  28.8*  INR 1.6  --  1.95  --   --   --  2.65  CREATININE  --  7.11*  --   --   --   --  3.90*  TROPONINI  --   --   --  <0.03  --  0.03* <0.03    Estimated Creatinine Clearance: 8 mL/min (A) (by C-G formula based on SCr of 3.9 mg/dL (H)).   Assessment: 83 y/o [name] admitted 09/27/2016 with SOB and chest pain. She takes warfarin PTA for Afib. Pharmacy consulted to manage inpatient.  PTA regimen: 2.5 mg daily except 3.75 MWF, per clinic notes was on 2.5 mg daily and increased to current regimen over the month of May  INR is therapeutic at 2.65 >> 1.95 with large increase on home dose. No bleeding noted, Hgb stable, platelets decreased to 130. Will give lower dose tonight.  Goal of Therapy:  INR 2-3 Monitor platelets by anticoagulation protocol: Yes   Plan:  - Warfarin 2.5 mg PO tonight - Daily INR - Monitor for s/sx of bleeding   Renold Genta, PharmD, BCPS Clinical Pharmacist Phone for today - [place] - [phone] 09/28/2016 6:54 AM

## 2016-09-28 NOTE — Progress Notes (Signed)
[facility] Progress Note   Subjective: Feels good today. Chest pain resolved with HD. Per pt for D/C today   Objective Vitals:   09/27/16 2130 09/27/16 2217 09/28/16 0516 09/28/16 0832  BP: (!) 107/58 (!) 105/37 (!) 106/39 (!) 113/33  Pulse: 94 (!) 59 63 (!) 59  Resp: 18 18 18 17   Temp:  97.7 F (36.5 C) 98.5 F (36.9 C) 97.8 F (36.6 C)  TempSrc:  Oral Oral Oral  SpO2:  96% 93% 95%  Weight:  55.9 kg (123 lb 3.8 oz)    Height:       Physical Exam General: WDWN  Elderly female NAD Lungs:  Breathing is unlabored. CTAB Heart: RRR with S1 S2 2/6 systolic murmur LSB No diastolic murmur or rub noted. Abdomen: soft NT + BS Lower extremities:without edema or ischemic changes, no open wounds  Dialysis Access: RUE AVG +bruit    Labs: Basic Metabolic Panel:  Recent Labs Lab 09/27/16 1125 09/27/16 1434 09/28/16 0210  NA 135  --  133*  K 4.8  --  4.0  CL 94*  --  93*  CO2 26  --  26  GLUCOSE 106*  --  135*  BUN 47*  --  18  CREATININE 7.11*  --  3.90*  CALCIUM 8.8*  --  8.6*  PHOS  --  3.2  --     Recent Labs Lab 09/27/16 1125 09/28/16 0210  AST 26 23  ALT 25 24  ALKPHOS 132* 114  BILITOT 0.6 0.5  PROT 6.7 6.1*  ALBUMIN 4.0 3.5    Recent Labs Lab 09/27/16 1835 09/28/16 0210  WBC 8.1 9.5  HGB 11.0* 11.0*  HCT 33.8* 34.2*  MCV 96.3 96.9  PLT 150 130*    Recent Labs Lab 09/27/16 1434 09/27/16 2244 09/28/16 0210  TROPONINI <0.03 0.03* <0.03    Lab Results  Component Value Date   INR 2.65 09/28/2016   INR 1.95 09/27/2016   INR 1.6 09/26/2016   Medications: . sodium chloride     . amiodarone  100 mg Oral Daily  . aspirin EC  325 mg Oral Daily  . atorvastatin  10 mg Oral QHS  . cinacalcet  30 mg Oral Q breakfast  . doxercalciferol  3 mcg Intravenous Q T,Th,Sa-HD  . feeding supplement (ENSURE ENLIVE)  237 mL Oral BID BM  . metoprolol tartrate  12.5 mg Oral BID  . multivitamin  1 tablet Oral QHS  . sodium chloride flush  3  mL Intravenous Q12H  . warfarin  2.5 mg Oral ONCE-1800  . Warfarin - Pharmacist Dosing Inpatient   Does not apply q1800   Background: Admitted under observation status for chest pain that began morning of 5/22  prior to dialysis treatment. Treated with nitroglycerin and ASA. Initially requiring supplemental oxygen on ED arrival with concern for fluid overload.    Dialysis Orders:  [place] 3.5h 160F BFR 400/A1.5x 2K/2.25 Ca Na Linear UF Prof 4 EDW 55.5 kg  R AVG Heparin 4000 U IV Bolus -Hectorol 3 mcg IV q HD -Mircera 60 mcg IV q 2 weeks (last dose 5/15)   Assessment/Plan: 1. Chest pain -improved with HD - per primary  2.  ESRD -  TTS Cont on schedule  3.  Hypertension/volume  - BP controlled/ CXR with small pleural effusions  Net UF 3L with Post HD wt 55.9kg  4.  Anemia  - Hgb 11.0 on OP ESA  5.  Metabolic bone disease -  Ca  8.8 OP Phos 3.7 No binders - Cont VDRA/Sensipar  6. Afib/flutter - controlled on Coumadin - per pharmacy INR 2.65 7.  Nutrition - Renal diet/vitamins   Lynnda Child PA-C [facility] Pager [phone] 09/28/2016,10:32 AM  LOS: 0 days   I have seen and examined this patient and agree with plan and assessment in the above note with renal recommendations/intervention highlighted. Pt fine for discharge to home today  Clarke Peretz B,MD 09/28/2016 12:32 PM

## 2016-09-28 NOTE — Discharge Instructions (Signed)

## 2016-09-28 NOTE — Discharge Summary (Signed)
Physician Discharge Summary  [name] DXI:[id number] DOB: [birth year] DOA: 09/27/2016  PCP: Levin Erp, MD  Admit date: 09/27/2016 Discharge date: 09/28/2016  Admitted From:home Disposition:home  Recommendations for Outpatient Follow-up:  1. Follow up with PCP in 1-2 weeks 2. Please obtain BMP/CBC in one week  Home Health:no Equipment/Devices:no Discharge Condition:stable CODE STATUS:full Diet recommendation:heart healthy  Brief/Interim Summary: 83 y.o. female with medical history significant of anemia, aortic stenosis, hyperlipidemia, ESRD, gout, hypertension, OSA, TIA, orthostatic syncope, Presented with shortness of breath and chest tightness. Patient was admitted for chest penetration. As per medical record, patient had acute hypoxia on admission requiring about 4 L of oxygen. Chest x-ray consistent with pulmonary edema. She received hemodialysis treatment with ultrafiltration. Patient reported significant clinical improvement today. She was in room air. Asking for discharge sooner. Denied headache, dizziness, nausea, vomiting, chest pain or shortness of breath. She can ambulate. She denied any home services. She reported that she lives with her family. I recommended patient to follow-up with PCP and continue her hemodialysis treatment outpatient. Troponin and EKG unremarkable. She has paroxysmal atrial fibrillation: Continue home medication and Coumadin. Also recommended to follow-up with her cardiologist/PCP for aortic stenosis. Patient is medically stable on discharge.  Discharge Diagnoses:  Active Problems:   ESRD (end stage renal disease) ([place])   HLD (hyperlipidemia)   Acute respiratory failure ([facility])   Atypical chest pain   Essential hypertension    Discharge Instructions  Discharge Instructions    Call MD for:  difficulty breathing, headache or visual disturbances    Complete by:  As directed    Call MD for:  extreme fatigue    Complete by:  As directed    Call MD for:  hives    Complete by:  As directed    Call MD for:  persistant dizziness or light-headedness    Complete by:  As directed    Call MD for:  persistant nausea and vomiting    Complete by:  As directed    Call MD for:  severe uncontrolled pain    Complete by:  As directed    Call MD for:  temperature >100.4    Complete by:  As directed    Diet - low sodium heart healthy    Complete by:  As directed    Increase activity slowly    Complete by:  As directed      Allergies as of 09/28/2016      Reactions   Tape Other (See Comments)   Pulls off skin   Sulfa Antibiotics Other (See Comments)   Bumps all over body      Medication List    TAKE these medications   amiodarone 200 MG tablet Commonly known as:  PACERONE Take 0.5 tablets (100 mg total) by mouth daily.   atorvastatin 10 MG tablet Commonly known as:  LIPITOR Take 10 mg by mouth at bedtime.   metoprolol tartrate 25 MG tablet Commonly known as:  LOPRESSOR Take 0.5 tablets (12.5 mg total) by mouth 2 (two) times daily.   multivitamin Tabs tablet Take 1 tablet by mouth daily.   SENSIPAR 30 MG tablet Generic drug:  cinacalcet Take 30 mg by mouth daily.   warfarin 2.5 MG tablet Commonly known as:  COUMADIN Take 1 tablet by mouth daily or as directed by coumadin clinic What changed:  how much to take  how to take this  when to take this  additional instructions      Follow-up Information  Levin Erp, MD. Schedule an appointment as soon as possible for a visit in 1 week(s).   Specialty:  Internal Medicine Contact information: [address], [address] [place] [address] [phone]          Allergies  Allergen Reactions  . Tape Other (See Comments)    Pulls off skin  . Sulfa Antibiotics Other (See Comments)    Bumps all over body    Consultations: Nephrology  Procedures/Studies: Hemodialysis  Subjective: Patient was seen and examined at bedside. She reported feeling  very good and wanted to go home today. Denied shortness of breath, cough, chest pain, nausea, vomiting. She understands her medications and follow-up with her PCP and nephrologist. She lives with the family.  Discharge Exam: Vitals:   09/28/16 0516 09/28/16 0832  BP: (!) 106/39 (!) 113/33  Pulse: 63 (!) 59  Resp: 18 17  Temp: 98.5 F (36.9 C) 97.8 F (36.6 C)   Vitals:   09/27/16 2130 09/27/16 2217 09/28/16 0516 09/28/16 0832  BP: (!) 107/58 (!) 105/37 (!) 106/39 (!) 113/33  Pulse: 94 (!) 59 63 (!) 59  Resp: 18 18 18 17   Temp:  97.7 F (36.5 C) 98.5 F (36.9 C) 97.8 F (36.6 C)  TempSrc:  Oral Oral Oral  SpO2:  96% 93% 95%  Weight:  55.9 kg (123 lb 3.8 oz)    Height:        General:Pleasant elderly female sitting on bed, Pt is alert, awake, not in acute distress Cardiovascular: RRR, S1/S2 +, no rubs, no gallops Respiratory: CTA bilaterally, no wheezing, no rhonchi Abdominal: Soft, NT, ND, bowel sounds + Extremities: no edema, no cyanosis    The results of significant diagnostics from this hospitalization (including imaging, microbiology, ancillary and laboratory) are listed below for reference.     Microbiology: Recent Results (from the past 240 hour(s))  MRSA PCR Screening     Status: None   Collection Time: 09/27/16  6:17 PM  Result Value Ref Range Status   MRSA by PCR NEGATIVE NEGATIVE Final    Comment:        The GeneXpert MRSA Assay (FDA approved for NASAL specimens only), is one component of a comprehensive MRSA colonization surveillance program. It is not intended to diagnose MRSA infection nor to guide or monitor treatment for MRSA infections.      Labs: BNP (last 3 results)  Recent Labs  09/27/16 1125  BNP 5,053.9*   Basic Metabolic Panel:  Recent Labs Lab 09/27/16 1125 09/27/16 1434 09/28/16 0210  NA 135  --  133*  K 4.8  --  4.0  CL 94*  --  93*  CO2 26  --  26  GLUCOSE 106*  --  135*  BUN 47*  --  18  CREATININE 7.11*  --   3.90*  CALCIUM 8.8*  --  8.6*  MG  --  2.2  --   PHOS  --  3.2  --    Liver Function Tests:  Recent Labs Lab 09/27/16 1125 09/28/16 0210  AST 26 23  ALT 25 24  ALKPHOS 132* 114  BILITOT 0.6 0.5  PROT 6.7 6.1*  ALBUMIN 4.0 3.5   No results for input(s): LIPASE, AMYLASE in the last 168 hours. No results for input(s): AMMONIA in the last 168 hours. CBC:  Recent Labs Lab 09/27/16 1835 09/28/16 0210  WBC 8.1 9.5  HGB 11.0* 11.0*  HCT 33.8* 34.2*  MCV 96.3 96.9  PLT 150 130*  Cardiac Enzymes:  Recent Labs Lab 09/27/16 1434 09/27/16 2244 09/28/16 0210  TROPONINI <0.03 0.03* <0.03   BNP: Invalid input(s): POCBNP CBG: No results for input(s): GLUCAP in the last 168 hours. D-Dimer No results for input(s): DDIMER in the last 72 hours. Hgb A1c No results for input(s): HGBA1C in the last 72 hours. Lipid Profile No results for input(s): CHOL, HDL, LDLCALC, TRIG, CHOLHDL, LDLDIRECT in the last 72 hours. Thyroid function studies No results for input(s): TSH, T4TOTAL, T3FREE, THYROIDAB in the last 72 hours.  Invalid input(s): FREET3 Anemia work up No results for input(s): VITAMINB12, FOLATE, FERRITIN, TIBC, IRON, RETICCTPCT in the last 72 hours. Urinalysis No results found for: COLORURINE, APPEARANCEUR, [place], [place], [place], [place], [place], [place], PROTEINUR, UROBILINOGEN, NITRITE, LEUKOCYTESUR Sepsis Labs Invalid input(s): PROCALCITONIN,  WBC,  LACTICIDVEN Microbiology Recent Results (from the past 240 hour(s))  MRSA PCR Screening     Status: None   Collection Time: 09/27/16  6:17 PM  Result Value Ref Range Status   MRSA by PCR NEGATIVE NEGATIVE Final    Comment:        The GeneXpert MRSA Assay (FDA approved for NASAL specimens only), is one component of a comprehensive MRSA colonization surveillance program. It is not intended to diagnose MRSA infection nor to guide or monitor treatment for MRSA infections.      Time coordinating  discharge: 28 minutes  SIGNED:   Rosita Fire, MD  [facility] 09/28/2016, 10:58 AM  If 7PM-7AM, please contact night-coverage [email or web address] Password [email or web address]

## 2016-09-28 NOTE — Discharge Planning (Signed)
Patient IV and tele removed.  Discharge papers given, explained and educated to patient and family.  Informed of suggest FU appts and appts made.  No scripts needed at this time.  RN assessment and VS revealed stability for DC to home. Awaiting arrival of ride, once ready will wheel to front and family transporting home via car.

## 2016-10-07 ENCOUNTER — Ambulatory Visit (INDEPENDENT_AMBULATORY_CARE_PROVIDER_SITE_OTHER): Payer: Medicare Other | Admitting: Pharmacist Clinician (PhC)/ Clinical Pharmacy Specialist

## 2016-10-07 ENCOUNTER — Other Ambulatory Visit: Payer: Self-pay | Admitting: Pharmacist Clinician (PhC)/ Clinical Pharmacy Specialist

## 2016-10-07 DIAGNOSIS — Z5181 Encounter for therapeutic drug level monitoring: Secondary | ICD-10-CM | POA: Diagnosis not present

## 2016-10-07 DIAGNOSIS — I4891 Unspecified atrial fibrillation: Secondary | ICD-10-CM

## 2016-10-07 DIAGNOSIS — Z7901 Long term (current) use of anticoagulants: Secondary | ICD-10-CM

## 2016-10-07 LAB — POCT INR: INR: 1.9

## 2016-10-07 MED ORDER — WARFARIN SODIUM 2.5 MG PO TABS: ORAL_TABLET | ORAL | 3 refills | Status: DC

## 2016-10-27 NOTE — Progress Notes (Signed)
Cardiology Office Note   Date:  10/29/2016   ID:  [name], DOB 1921/12/08, MRN [id number]  PCP:  Levin Erp, MD  Cardiologist:   Minus Breeding, MD   Chief Complaint  Patient presents with  . Atrial Fibrillation     History of Present Illness: [name] is a 83 y.o. female who presents for evaluation of aortic stenosis and atrial fib.  She was in the hospital recently with atrial fib.   She was treated with amiodarone and started on warfarin.   Following this she was admitted with CHF and she had diuresis with ultrafiltration.      She said that since then she's felt well. She's supposed to have her dialysis fistula revised but apparently that was canceled because of a low heart rate. She actually has been doing fine. She denies any palpitations, presyncope or syncope. She's not had any shortness of breath, PND or orthopnea. He said no weight gain or edema. She says that despite her fistula problems they're able to complete dialysis. She watches salt and fluid.  Past Medical History:  Diagnosis Date  . Anemia   . Aortic stenosis   . Benign bladder mass   . Chronic cough   . DJD (degenerative joint disease)   . Dyslipidemia   . ESRD on hemodialysis ([place])    "TTS; [facility]" (09/27/2016)  . Gout   . HTN (hypertension)   . Obstructive sleep apnea    mild  . Orthostatic syncope    around year 2006  . Pneumonia   . TIA (transient ischemic attack)     Past Surgical History:  Procedure Laterality Date  . BONE MARROW BIOPSY    . CATARACT EXTRACTION W/ INTRAOCULAR LENS  IMPLANT, BILATERAL    . COLONOSCOPY W/ BIOPSIES AND POLYPECTOMY    . left arm dialysis graft    . NEPHRECTOMY     left  . REVISION OF ARTERIOVENOUS GORETEX GRAFT Right 11/24/2014   Procedure: REPLACEMENT OF  MEDIAL SIDE OF RIGHT FORARM ARTERIOVENOUS GORETEX GRAFT;  Surgeon: Conrad La Cienega, MD;  Location: [facility];  Service: Vascular;  Laterality: Right;  . REVISION OF ARTERIOVENOUS GORETEX  GRAFT Right 11/28/2015   Procedure: REVISION OF RIGHT ARM ARTERIOVENOUS GORETEX GRAFT;  Surgeon: Serafina Mitchell, MD;  Location: [facility];  Service: Vascular;  Laterality: Right;  . TOTAL ABDOMINAL HYSTERECTOMY       Current Outpatient Prescriptions  Medication Sig Dispense Refill  . amiodarone (PACERONE) 200 MG tablet Take 0.5 tablets (100 mg total) by mouth daily. 30 tablet 5  . atorvastatin (LIPITOR) 10 MG tablet Take 10 mg by mouth at bedtime.      . multivitamin (RENA-VIT) TABS tablet Take 1 tablet by mouth daily.    . SENSIPAR 30 MG tablet Take 30 mg by mouth daily.     Marland Kitchen warfarin (COUMADIN) 2.5 MG tablet Take 1 to 1.5 tablets by mouth daily as directed by coumadin clinic 40 tablet 3  . amLODipine (NORVASC) 2.5 MG tablet Take 1 tablet (2.5 mg total) by mouth daily. 30 tablet 11   No current facility-administered medications for this visit.     Allergies:   Tape and Sulfa antibiotics    ROS:  Please see the history of present illness.   Otherwise, review of systems are positive for none.   All other systems are reviewed and negative.    PHYSICAL EXAM: VS:  BP (!) 181/57   Pulse (!) 51  Ht _0  (1.651 m)   Wt 121 lb 3.2 oz (55 kg)   BMI 20.17 kg/m  , BMI Body mass index is 20.17 kg/m.  GENERAL:  Well appearing NECK:  No jugular venous distention, waveform within normal limits, carotid upstroke brisk and symmetric, no bruits, no thyromegaly LUNGS:  Clear to auscultation bilaterally BACK:  No CVA tenderness CHEST:  Unremarkable HEART:  PMI not displaced or sustained,S1 and S2 within normal limits, no S3, no S4, no clicks, no rubs, 3 out of 6 mid to late peaking systolic murmur radiating out the aortic outflow tract murmurs ABD:  Flat, positive bowel sounds normal in frequency in pitch, no bruits, no rebound, no guarding, no midline pulsatile mass, no hepatomegaly, no splenomegaly EXT:  2 plus pulses throughout, no edema, no cyanosis no clubbing, dialysis fistula with slight  pulsatile mass or bruit right forearm   EKG:  EKG is not  ordered today. The ekg ordered r07/22/2017 demonstrates sinus rhythm, rate 47, right bundle branch block, no acute ST-T wave changes.   Recent Labs: 08/20/2016: TSH 2.400 09/27/2016: B Natriuretic Peptide 1,532.2; Magnesium 2.2 09/28/2016: ALT 24; BUN 18; Creatinine, Ser 3.90; Hemoglobin 11.0; Platelets 130; Potassium 4.0; Sodium 133    Lipid Panel    Component Value Date/Time   CHOL 143 08/21/2016 0429   TRIG 62 08/21/2016 0429   HDL 68 08/21/2016 0429   CHOLHDL 2.1 08/21/2016 0429   VLDL 12 08/21/2016 0429   LDLCALC 63 08/21/2016 0429      Wt Readings from Last 3 Encounters:  10/28/16 121 lb 3.2 oz (55 kg)  09/27/16 123 lb 3.8 oz (55.9 kg)  09/07/16 124 lb (56.2 kg)      Other studies Reviewed: Additional studies/ records that were reviewed today include: Hospital records. Review of the above records demonstrates:     ASSESSMENT AND PLAN:  AS:   She does have severe AS but for the most part fluid is controlled with dialysis.  She might if she developed this in the future and it was not controlled through dialysis be a candidate for  TAVR .     ATRIAL FIB:  She is maintaining sinus rhythm. She is doing okay with anticoagulation.  [name] has a CHA2DS2 - VASc score of 6.  We'll have her come back in 2 months at which point she can get follow-up labs to include liver and TSH.  PULMONARY HTN:   She has no symptoms.    This can be followed clinically   RBBB:   She does have bradycardia.  I will stop the metoprolol. I think she be at acceptable risk for the fistula revision as needed.  HTN:  I will start low dose Norvasc.     Current medicines are reviewed at length with the patient today.  The patient does not have concerns regarding medicines.  The following changes have been made:  None  Labs/ tests ordered today include:  None  Orders Placed This Encounter  Procedures  . EKG 12-Lead      Disposition:   FU with Kerin Ransom in two months. Ronnell Guadalajara, MD  10/29/2016 9:03 AM    [place]

## 2016-10-28 ENCOUNTER — Ambulatory Visit (INDEPENDENT_AMBULATORY_CARE_PROVIDER_SITE_OTHER): Payer: Medicare Other | Admitting: Cardiology

## 2016-10-28 ENCOUNTER — Ambulatory Visit (INDEPENDENT_AMBULATORY_CARE_PROVIDER_SITE_OTHER): Payer: Medicare Other | Admitting: Pharmacist Clinician (PhC)/ Clinical Pharmacy Specialist

## 2016-10-28 VITALS — BP 181/57 | HR 51 | Ht 65.0 in | Wt 121.2 lb

## 2016-10-28 DIAGNOSIS — Z5181 Encounter for therapeutic drug level monitoring: Secondary | ICD-10-CM

## 2016-10-28 DIAGNOSIS — I350 Nonrheumatic aortic (valve) stenosis: Secondary | ICD-10-CM

## 2016-10-28 DIAGNOSIS — I4891 Unspecified atrial fibrillation: Secondary | ICD-10-CM | POA: Diagnosis not present

## 2016-10-28 DIAGNOSIS — I4819 Other persistent atrial fibrillation: Secondary | ICD-10-CM

## 2016-10-28 DIAGNOSIS — R001 Bradycardia, unspecified: Secondary | ICD-10-CM | POA: Diagnosis not present

## 2016-10-28 DIAGNOSIS — Z7901 Long term (current) use of anticoagulants: Secondary | ICD-10-CM

## 2016-10-28 DIAGNOSIS — I481 Persistent atrial fibrillation: Secondary | ICD-10-CM

## 2016-10-28 LAB — POCT INR: INR: 1.5

## 2016-10-28 MED ORDER — AMLODIPINE BESYLATE 2.5 MG PO TABS: 2.5000 mg | ORAL_TABLET | Freq: Every day | ORAL | 11 refills | Status: DC

## 2016-10-28 NOTE — Patient Instructions (Addendum)
Medication Instructions:  STOP- Metoprolol START- Amlodipine 2.5 mg daily  Labwork: None Ordered  Testing/Procedures: None Ordered  Follow-Up: Your physician recommends that you schedule a follow-up appointment in: 2 Months with APP   Any Other Special Instructions Will Be Listed Below (If Applicable).    If you need a refill on your cardiac medications before your next appointment, please call your pharmacy.

## 2016-10-29 ENCOUNTER — Encounter: Payer: Self-pay | Admitting: Cardiology

## 2016-10-29 DIAGNOSIS — R001 Bradycardia, unspecified: Secondary | ICD-10-CM | POA: Insufficient documentation

## 2016-11-03 LAB — PROTIME-INR

## 2016-11-08 ENCOUNTER — Emergency Department (HOSPITAL_COMMUNITY): Payer: Medicare Other

## 2016-11-08 ENCOUNTER — Encounter (HOSPITAL_COMMUNITY): Payer: Self-pay | Admitting: *Deleted

## 2016-11-08 ENCOUNTER — Emergency Department (HOSPITAL_COMMUNITY)
Admission: EM | Admit: 2016-11-08 | Discharge: 2016-11-08 | Disposition: A | Discharge status: Home / Self Care | Payer: Medicare Other | Attending: Emergency Medicine | Admitting: Emergency Medicine

## 2016-11-08 DIAGNOSIS — I4892 Unspecified atrial flutter: Secondary | ICD-10-CM | POA: Diagnosis not present

## 2016-11-08 DIAGNOSIS — Z7901 Long term (current) use of anticoagulants: Secondary | ICD-10-CM | POA: Diagnosis not present

## 2016-11-08 DIAGNOSIS — R791 Abnormal coagulation profile: Secondary | ICD-10-CM | POA: Insufficient documentation

## 2016-11-08 DIAGNOSIS — Z8673 Personal history of transient ischemic attack (TIA), and cerebral infarction without residual deficits: Secondary | ICD-10-CM | POA: Diagnosis not present

## 2016-11-08 DIAGNOSIS — Z79899 Other long term (current) drug therapy: Secondary | ICD-10-CM | POA: Diagnosis not present

## 2016-11-08 DIAGNOSIS — I120 Hypertensive chronic kidney disease with stage 5 chronic kidney disease or end stage renal disease: Secondary | ICD-10-CM | POA: Diagnosis not present

## 2016-11-08 DIAGNOSIS — Z992 Dependence on renal dialysis: Secondary | ICD-10-CM | POA: Insufficient documentation

## 2016-11-08 DIAGNOSIS — N186 End stage renal disease: Secondary | ICD-10-CM | POA: Insufficient documentation

## 2016-11-08 DIAGNOSIS — Z905 Acquired absence of kidney: Secondary | ICD-10-CM | POA: Insufficient documentation

## 2016-11-08 DIAGNOSIS — E211 Secondary hyperparathyroidism, not elsewhere classified: Secondary | ICD-10-CM | POA: Insufficient documentation

## 2016-11-08 DIAGNOSIS — I950 Idiopathic hypotension: Secondary | ICD-10-CM | POA: Insufficient documentation

## 2016-11-08 DIAGNOSIS — R531 Weakness: Secondary | ICD-10-CM | POA: Diagnosis present

## 2016-11-08 LAB — CBC WITH DIFFERENTIAL/PLATELET
BASOS PCT: 1 %
Basophils Absolute: 0 10*3/uL (ref 0.0–0.1)
EOS ABS: 0.1 10*3/uL (ref 0.0–0.7)
EOS PCT: 2 %
HCT: 37.6 % (ref 36.0–46.0)
HEMOGLOBIN: 12 g/dL (ref 12.0–15.0)
LYMPHS ABS: 1.7 10*3/uL (ref 0.7–4.0)
Lymphocytes Relative: 39 %
MCH: 30.8 pg (ref 26.0–34.0)
MCHC: 31.9 g/dL (ref 30.0–36.0)
MCV: 96.4 fL (ref 78.0–100.0)
MONOS PCT: 5 %
Monocytes Absolute: 0.2 10*3/uL (ref 0.1–1.0)
NEUTROS PCT: 54 %
Neutro Abs: 2.4 10*3/uL (ref 1.7–7.7)
PLATELETS: 106 10*3/uL — AB (ref 150–400)
RBC: 3.9 MIL/uL (ref 3.87–5.11)
RDW: 16.4 % — ABNORMAL HIGH (ref 11.5–15.5)
WBC: 4.4 10*3/uL (ref 4.0–10.5)

## 2016-11-08 LAB — I-STAT TROPONIN, ED
TROPONIN I, POC: 0.07 ng/mL (ref 0.00–0.08)
Troponin i, poc: 0.03 ng/mL (ref 0.00–0.08)

## 2016-11-08 LAB — PROTIME-INR
INR: 1.33
PROTHROMBIN TIME: 16.6 s — AB (ref 11.4–15.2)

## 2016-11-08 LAB — COMPREHENSIVE METABOLIC PANEL
ALBUMIN: 3.8 g/dL (ref 3.5–5.0)
ALT: 16 U/L (ref 14–54)
AST: 45 U/L — ABNORMAL HIGH (ref 15–41)
Alkaline Phosphatase: 84 U/L (ref 38–126)
Anion gap: 13 (ref 5–15)
BUN: 17 mg/dL (ref 6–20)
CHLORIDE: 96 mmol/L — AB (ref 101–111)
CO2: 28 mmol/L (ref 22–32)
Calcium: 9.5 mg/dL (ref 8.9–10.3)
Creatinine, Ser: 3.16 mg/dL — ABNORMAL HIGH (ref 0.44–1.00)
GFR calc non Af Amer: 12 mL/min — ABNORMAL LOW (ref >60)
GFR, EST AFRICAN AMERICAN: 14 mL/min — AB (ref >60)
GLUCOSE: 85 mg/dL (ref 65–99)
POTASSIUM: 5.3 mmol/L — AB (ref 3.5–5.1)
SODIUM: 137 mmol/L (ref 135–145)
Total Bilirubin: 1.7 mg/dL — ABNORMAL HIGH (ref 0.3–1.2)
Total Protein: 6.7 g/dL (ref 6.5–8.1)

## 2016-11-08 LAB — I-STAT CHEM 8, ED
BUN: 18 mg/dL (ref 6–20)
CALCIUM ION: 1.04 mmol/L — AB (ref 1.15–1.40)
CHLORIDE: 98 mmol/L — AB (ref 101–111)
Creatinine, Ser: 2.9 mg/dL — ABNORMAL HIGH (ref 0.44–1.00)
Glucose, Bld: 83 mg/dL (ref 65–99)
HEMATOCRIT: 38 % (ref 36.0–46.0)
Hemoglobin: 12.9 g/dL (ref 12.0–15.0)
POTASSIUM: 4.2 mmol/L (ref 3.5–5.1)
SODIUM: 136 mmol/L (ref 135–145)
TCO2: 28 mmol/L (ref 0–100)

## 2016-11-08 MED ORDER — SODIUM CHLORIDE 0.9 % IV BOLUS (SEPSIS)
500.0000 mL | Freq: Once | INTRAVENOUS | Status: AC
  Administered 2016-11-08: 500 mL via INTRAVENOUS

## 2016-11-08 NOTE — ED Provider Notes (Addendum)
[facility] DEPT Provider Note   CSN: [id number] Arrival date & time: 11/08/16  1513     History   Chief Complaint Chief Complaint  Patient presents with  . Weakness    HPI [name] is a 83 y.o. female history of aortic stenosis, ESRD on dialysis (last HD today), hypertension, atrial flutter on coumadin, who presenting with weakness, hypotension. Patient states that her blood pressure has ran low 4 days ago when she was at dialysis. She states that she feels fine she just feels a little weak and dizzy. Denies any chest pain or palpitations. Today she was at dialysis and finished dialysis and blood pressure was in the 80s. Also took off about 3 liters of fluid. She feels more lightheaded and dizzy afterwards. Continues to denies any chest pain or shortness breath. EMS noticed that she was in atrial flutter. Of note, she was admitted several months ago and was diagnosed with atrial flutter and has been on coumadin, metoprolol.    The history is provided by the patient.    Past Medical History:  Diagnosis Date  . Anemia   . Aortic stenosis   . Benign bladder mass   . Chronic cough   . DJD (degenerative joint disease)   . Dyslipidemia   . ESRD on hemodialysis ([place])    "TTS; [facility]" (09/27/2016)  . Gout   . HTN (hypertension)   . Obstructive sleep apnea    mild  . Orthostatic syncope    around year 2006  . Pneumonia   . TIA (transient ischemic attack)     Patient Active Problem List   Diagnosis Date Noted  . Bradycardia 10/29/2016  . Acute respiratory failure ([place]) 09/27/2016  . Atypical chest pain 09/27/2016  . Essential hypertension 09/27/2016  . Acute pulmonary edema ([facility])   . Other chest pain   . Coumadin Rx 09/07/2016  . Monitoring for long-term anticoagulant use 08/29/2016  . Atrial flutter ([place])   . PAF (paroxysmal atrial fibrillation) ([place]) 08/22/2016  . Atrial fibrillation with RVR ([place]) 08/20/2016  . Severe aortic stenosis 08/20/2016  . New  onset a-fib ([place]) 08/20/2016  . Staphylococcus aureus bacteremia   . 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 Bleeding from dialysis shunt ([place])   . Nausea   . Fever in adult 11/27/2015  . Hemorrhage of arteriovenous graft ([place]) 11/27/2015  . HLD (hyperlipidemia) 11/27/2015  . Bleeding 11/27/2015  . Influenza A with respiratory manifestations 05/08/2013  . Cough 05/08/2013  . Chronic cough 02/15/2013  . Secondary hyperparathyroidism ([facility]) 11/21/2012  . ESRD (end stage renal disease) ([place]) 11/20/2012    Past Surgical History:  Procedure Laterality Date  . BONE MARROW BIOPSY    . CATARACT EXTRACTION W/ INTRAOCULAR LENS  IMPLANT, BILATERAL    . COLONOSCOPY W/ BIOPSIES AND POLYPECTOMY    . left arm dialysis graft    . NEPHRECTOMY     left  . REVISION OF ARTERIOVENOUS GORETEX GRAFT Right 11/24/2014   Procedure: REPLACEMENT OF  MEDIAL SIDE OF RIGHT FORARM ARTERIOVENOUS GORETEX GRAFT;  Surgeon: Conrad Gardena, MD;  Location: [facility];  Service: Vascular;  Laterality: Right;  . REVISION OF ARTERIOVENOUS GORETEX GRAFT Right 11/28/2015   Procedure: REVISION OF RIGHT ARM ARTERIOVENOUS GORETEX GRAFT;  Surgeon: Serafina Mitchell, MD;  Location: [facility];  Service: Vascular;  Laterality: Right;  . TOTAL ABDOMINAL HYSTERECTOMY      OB History    No data available  Home Medications    Prior to Admission medications   Medication Sig Start Date End Date Taking? Authorizing Provider  amiodarone (PACERONE) 200 MG tablet Take 0.5 tablets (100 mg total) by mouth daily. 09/07/16   Erlene Quan, PA-C  amLODipine (NORVASC) 2.5 MG tablet Take 1 tablet (2.5 mg total) by mouth daily. 10/28/16 01/26/17  Minus Breeding, MD  atorvastatin (LIPITOR) 10 MG tablet Take 10 mg by mouth at bedtime.      [provider]  multivitamin (RENA-VIT) TABS tablet Take 1 tablet by mouth daily.    [provider]  SENSIPAR 30 MG tablet Take 30 mg by mouth  daily.  02/14/13   [provider]  warfarin (COUMADIN) 2.5 MG tablet Take 1 to 1.5 tablets by mouth daily as directed by coumadin clinic 10/07/16   Minus Breeding, MD    Family History Family History  Problem Relation Age of Onset  . Hypertension Mother   . Diabetes Mother   . Hypertension Other     Social History Social History  Substance Use Topics  . Smoking status: Never Smoker  . Smokeless tobacco: Never Used  . Alcohol use No     Allergies   Tape and Sulfa antibiotics   Review of Systems Review of Systems  Neurological: Positive for dizziness and weakness.  All other systems reviewed and are negative.    Physical Exam Updated Vital Signs BP 120/61   Pulse 94   Temp 98.2 F (36.8 C) (Oral)   Resp 20   Ht _0  (1.651 m)   Wt 56.4 kg (124 lb 5.4 oz)   SpO2 97%   BMI 20.69 kg/m   Physical Exam  Constitutional: She is oriented to person, place, and time.  Chronically ill   HENT:  Head: Normocephalic.  Mouth/Throat: Oropharynx is clear and moist.  Eyes: Conjunctivae and EOM are normal. Pupils are equal, round, and reactive to light.  Neck: Normal range of motion. Neck supple.  Cardiovascular: Normal rate and regular rhythm.   + systolic murmur loudest at Left upper sternal border   Pulmonary/Chest: Effort normal.  Crackles bilateral bases   Abdominal: Soft. Bowel sounds are normal.  Musculoskeletal: Normal range of motion.  Neurological: She is alert and oriented to person, place, and time. No cranial nerve deficit. Coordination normal.  Skin: Skin is warm.  Psychiatric: She has a normal mood and affect.  Nursing note and vitals reviewed.    ED Treatments / Results  Labs (all labs ordered are listed, but only abnormal results are displayed) Labs Reviewed  CBC WITH DIFFERENTIAL/PLATELET - Abnormal; Notable for the following:       Result Value   RDW 16.4 (*)    Platelets 106 (*)    All other components within normal limits    COMPREHENSIVE METABOLIC PANEL - Abnormal; Notable for the following:    Potassium 5.3 (*)    Chloride 96 (*)    Creatinine, Ser 3.16 (*)    AST 45 (*)    Total Bilirubin 1.7 (*)    GFR calc non Af Amer 12 (*)    GFR calc Af Amer 14 (*)    All other components within normal limits  PROTIME-INR - Abnormal; Notable for the following:    Prothrombin Time 16.6 (*)    All other components within normal limits  I-STAT CHEM 8, ED - Abnormal; Notable for the following:    Chloride 98 (*)    Creatinine, Ser 2.90 (*)  Calcium, Ion 1.04 (*)    All other components within normal limits  I-STAT TROPOININ, ED  I-STAT TROPOININ, ED    EKG  EKG Interpretation  Date/Time:  Tuesday November 08 2016 16:11:30 EDT Ventricular Rate:  104 PR Interval:    QRS Duration: 148 QT Interval:  390 QTC Calculation: 513 R Axis:   23 Text Interpretation:  Atrial flutter Right bundle branch block previous EKG showed sinus but she had atrial flutter before  Confirmed by Wandra Arthurs [id number]) on 11/08/2016 5:11:34 PM       Radiology Dg Chest Port 1 View  Result Date: 11/08/2016 CLINICAL DATA:  Weakness, dizziness, hypotension after dialysis EXAM: PORTABLE CHEST 1 VIEW COMPARISON:  Chest x-ray of 09/27/2016 FINDINGS: No active infiltrate or effusion is seen. The lung bases are better aerated. Mediastinal and hilar contours are unchanged and cardiomegaly is stable. The bones appear somewhat osteopenic. IMPRESSION: No pneumonia or effusion.  Stable mild cardiomegaly. Electronically Signed   By: Ivar Drape M.D.   On: 11/08/2016 16:25    Procedures Procedures (including critical care time)  Angiocath insertion Performed by: Wandra Arthurs  Consent: Verbal consent obtained. Risks and benefits: risks, benefits and alternatives were discussed Time out: Immediately prior to procedure a "time out" was called to verify the correct patient, procedure, equipment, support staff and site/side marked as  required.  Preparation: Patient was prepped and draped in the usual sterile fashion.  Vein Location: L antecube  Ultrasound Guided  Gauge: 20 long   Normal blood return and flush without difficulty Patient tolerance: Patient tolerated the procedure well with no immediate complications.     Medications Ordered in ED Medications  sodium chloride 0.9 % bolus 500 mL (0 mLs Intravenous Stopped 11/08/16 1830)     Initial Impression / Assessment and Plan / ED Course  I have reviewed the triage vital signs and the nursing notes.  Pertinent labs & imaging results that were available during my care of the patient were reviewed by me and considered in my medical decision making (see chart for details).     [name] is a 83 y.o. female here with weakness, hypotension. She is in aflutter. She has paroxysmal aflutter and that is why she is on coumadin (CHADVASC 6). Afebrile, no infectious symptoms. Will check labs, give IVF and reassess.   8:15 PM Labs at baseline. K 4.2. Delta trop nl. INR 1.3. BP 120/61 after 500 cc bolus. Not orthostatic after IVF. HR down to 90s. She has hx of aflutter so I told her to continue her meds and follow up with cardiology.   Final Clinical Impressions(s) / ED Diagnoses   Final diagnoses:  None    New Prescriptions New Prescriptions   No medications on file     Drenda Freeze, MD 11/08/16 2014    Drenda Freeze, MD 11/08/16 2015    Drenda Freeze, MD 11/21/16 2115

## 2016-11-08 NOTE — ED Triage Notes (Signed)
Pt sent from Dialysis center today because during Dialysis SBP was in the 80's. Pt also reported feeling weak and dizzy.  Pt has also reported over the weekend she was weak and dizzy. Pt checkes BP at home and reports BP was low . PCP recently stopped Pt's. BP meds due to low BP.

## 2016-11-08 NOTE — ED Notes (Signed)
RN attempted x2 to insert IV.

## 2016-11-08 NOTE — Discharge Instructions (Signed)
Continue your current meds.   Increase coumadin to 1.5 tablet daily until you get it rechecked on Friday.   Atrial flutter may cause your blood pressure to be low. Please let your dialysis doctor know about it on Thursday.   See your doctor in a week   Return to ER if you have passing out, chest pain, shortness of breath, worsening weakness, fever.

## 2016-11-10 ENCOUNTER — Telehealth (HOSPITAL_COMMUNITY): Payer: Self-pay | Admitting: *Deleted

## 2016-11-10 NOTE — Telephone Encounter (Signed)
Left msg with pts daughter for pt to contact us re follow up appt.  She stated that she had a few concerns regarding pt care and cleanliness in the ED and I informed her that a patient survey would be sent.  I explained the importance of a pt follow up appt and she stated she would have her mother check with the coumadin clinic when she has her INR checked tomorrow.

## 2016-11-11 ENCOUNTER — Ambulatory Visit (INDEPENDENT_AMBULATORY_CARE_PROVIDER_SITE_OTHER): Payer: Medicare Other | Admitting: Pharmacist

## 2016-11-11 DIAGNOSIS — Z7901 Long term (current) use of anticoagulants: Secondary | ICD-10-CM

## 2016-11-11 DIAGNOSIS — Z5181 Encounter for therapeutic drug level monitoring: Secondary | ICD-10-CM

## 2016-11-11 DIAGNOSIS — I4891 Unspecified atrial fibrillation: Secondary | ICD-10-CM | POA: Diagnosis not present

## 2016-11-11 LAB — POCT INR: INR: 1.7

## 2016-11-11 MED ORDER — WARFARIN SODIUM 2.5 MG PO TABS: ORAL_TABLET | ORAL | 3 refills | Status: DC

## 2016-11-15 LAB — PROTIME-INR: INR: 2.3 — AB (ref 0.9–1.1)

## 2016-11-18 ENCOUNTER — Encounter: Payer: Self-pay | Admitting: Pharmacist

## 2016-11-18 ENCOUNTER — Ambulatory Visit (INDEPENDENT_AMBULATORY_CARE_PROVIDER_SITE_OTHER): Payer: Medicare Other | Admitting: Pharmacist

## 2016-11-18 DIAGNOSIS — Z5181 Encounter for therapeutic drug level monitoring: Secondary | ICD-10-CM

## 2016-11-18 DIAGNOSIS — I4891 Unspecified atrial fibrillation: Secondary | ICD-10-CM

## 2016-11-18 DIAGNOSIS — Z7901 Long term (current) use of anticoagulants: Secondary | ICD-10-CM

## 2016-11-22 LAB — PROTIME-INR: INR: 3.1 — AB (ref 0.9–1.1)

## 2016-11-23 ENCOUNTER — Ambulatory Visit (INDEPENDENT_AMBULATORY_CARE_PROVIDER_SITE_OTHER): Payer: Medicare Other | Admitting: Pharmacist Clinician (PhC)/ Clinical Pharmacy Specialist

## 2016-11-23 ENCOUNTER — Ambulatory Visit (INDEPENDENT_AMBULATORY_CARE_PROVIDER_SITE_OTHER): Payer: Medicare Other | Admitting: Pharmacist

## 2016-11-23 VITALS — BP 110/58 | HR 110

## 2016-11-23 DIAGNOSIS — Z5181 Encounter for therapeutic drug level monitoring: Secondary | ICD-10-CM

## 2016-11-23 DIAGNOSIS — I10 Essential (primary) hypertension: Secondary | ICD-10-CM | POA: Diagnosis not present

## 2016-11-23 DIAGNOSIS — I4891 Unspecified atrial fibrillation: Secondary | ICD-10-CM

## 2016-11-23 DIAGNOSIS — Z7901 Long term (current) use of anticoagulants: Secondary | ICD-10-CM

## 2016-11-23 LAB — PROTIME-INR

## 2016-11-23 MED ORDER — METOPROLOL TARTRATE 25 MG PO TABS: 12.5000 mg | ORAL_TABLET | Freq: Every day | ORAL | 1 refills | Status: DC

## 2016-11-23 NOTE — Patient Instructions (Addendum)
Return for a follow up appointment with Armanda Heritage  Your blood pressure today is 110/58 pulse 110  Check your blood pressure at home daily (if able) and keep record of the readings.  Take your BP meds as follows: *START Metoprolol tartrate 12.5mg  daily* Continue all other medication as previously prescribed  Bring all of your meds, your BP cuff and your record of home blood pressures to your next appointment.  Exercise as you're able, try to walk approximately 30 minutes per day.  Keep salt intake to a minimum, especially watch canned and prepared boxed foods.  Eat more fresh fruits and vegetables and fewer canned items.  Avoid eating in fast food restaurants.    HOW TO TAKE YOUR BLOOD PRESSURE: . Rest 5 minutes before taking your blood pressure. .  Don't smoke or drink caffeinated beverages for at least 30 minutes before. . Take your blood pressure before (not after) you eat. . Sit comfortably with your back supported and both feet on the floor (don't cross your legs). . Elevate your arm to heart level on a table or a desk. . Use the proper sized cuff. It should fit smoothly and snugly around your bare upper arm. There should be enough room to slip a fingertip under the cuff. The bottom edge of the cuff should be 1 inch above the crease of the elbow. . Ideally, take 3 measurements at one sitting and record the average.

## 2016-11-23 NOTE — Assessment & Plan Note (Signed)
Blood pressure today consider low normal. Patient denies problems with dizziness, falls or light-headedness. Noted HR is above 100bpm. Patient used metoprolol 12.5mg  twice daily in the past but d/c after episode of bradycardia. Will resume metoprolol tartrate at 12.5mg  daily (low dose) to improve HR without significant effect on blood pressure. Patient will monitor BP at home and keep records to bring to f/u appointment with APP in 3 weeks. Also instructed to call clinic if problems with pulse or HR noted between appointments.

## 2016-11-23 NOTE — Progress Notes (Signed)
Patient ID: DAWSYN ZURN                 DOB: 1921/10/18                      MRN: [id number]     HPI: [name] is a 83 y.o. female patient of Dr. Percival Spanish presents to HTN clinic for initial evaluation. PMH includes aortic stenosis, dyslipidemia, ESRD on hemodialysis, hypertension, Aflutter, and hx of TIA.  Metoprolol was stopped in the past due to bradycardia. Low dose amlodipine was initiated on 10/28/16 by Dr Percival Spanish for HTN management.   Patient presents today for HTN follow up. Denies dizziness, light-headedness, swelling or chest pain. Express concern about some low BP reading during dialysis and reports she is not taking amlodipine anymore per "kidney doctor" instruction.  Current HTN meds:  Amlodipine 25mg  daily  Previously tried:  Amlodipine 10mg  Metoprolol 12.5mg  twice daily  BP goal: 140/90  Social History: denies tobacco use, denies alcohol intake  Diet: Keep a low sodium diet, mostly home-cokoked meal  Exercise: activity of daily living  Home BP readings:  [facility] vitals  11/20/15: pre-dialysis 125/68    Post 109/54 11/23/15: pre-dialysis 120/58    Post 103/57  Wt Readings from Last 3 Encounters:  11/08/16 124 lb 5.4 oz (56.4 kg)  10/28/16 121 lb 3.2 oz (55 kg)  09/27/16 123 lb 3.8 oz (55.9 kg)   BP Readings from Last 3 Encounters:  11/23/16 (!) 110/58  11/08/16 120/61  10/28/16 (!) 181/57   Pulse Readings from Last 3 Encounters:  11/23/16 (!) 110  11/08/16 94  10/28/16 (!) 51    Past Medical History:  Diagnosis Date  . Anemia   . Aortic stenosis   . Benign bladder mass   . Chronic cough   . DJD (degenerative joint disease)   . Dyslipidemia   . ESRD on hemodialysis ([facility])    "TTS; [facility]" (09/27/2016)  . Gout   . HTN (hypertension)   . Obstructive sleep apnea    mild  . Orthostatic syncope    around year 2006  . Pneumonia   . TIA (transient ischemic attack)     Current Outpatient Prescriptions on File Prior to Visit    Medication Sig Dispense Refill  . amiodarone (PACERONE) 200 MG tablet Take 0.5 tablets (100 mg total) by mouth daily. 30 tablet 5  . atorvastatin (LIPITOR) 10 MG tablet Take 10 mg by mouth at bedtime.      . multivitamin (RENA-VIT) TABS tablet Take 1 tablet by mouth daily.    . SENSIPAR 30 MG tablet Take 30 mg by mouth daily.     Marland Kitchen warfarin (COUMADIN) 2.5 MG tablet Take 1 to 1.5 tablets by mouth daily as directed by coumadin clinic 45 tablet 3  . [DISCONTINUED] chlorpheniramine (CHLOR-TRIMETON) 4 MG tablet One pill at night as needed for cough 30 tablet 0   No current facility-administered medications on file prior to visit.     Allergies  Allergen Reactions  . Tape Other (See Comments)    Pulls off skin  . Sulfa Antibiotics Other (See Comments)    Bumps all over body    Blood pressure (!) 110/58, pulse (!) 110.  Essential hypertension Blood pressure today consider low normal. Patient denies problems with dizziness, falls or light-headedness. Noted HR is above 100bpm. Patient used metoprolol 12.5mg  twice daily in the past but d/c after episode of bradycardia. Will resume metoprolol tartrate at 12.5mg   daily (low dose) to improve HR without significant effect on blood pressure. Patient will monitor BP at home and keep records to bring to f/u appointment with APP in 3 weeks. Also instructed to call clinic if problems with pulse or HR noted between appointments.   Aeralyn Barna Rodriguez-Guzman PharmD, [place] [id number] 11/23/2016 8:28 PM

## 2016-12-01 LAB — PROTIME-INR: INR: 3.2 — AB (ref 0.9–1.1)

## 2016-12-03 ENCOUNTER — Other Ambulatory Visit: Payer: Self-pay | Admitting: Cardiology

## 2016-12-05 ENCOUNTER — Ambulatory Visit (INDEPENDENT_AMBULATORY_CARE_PROVIDER_SITE_OTHER): Payer: Self-pay | Admitting: Pharmacist Clinician (PhC)/ Clinical Pharmacy Specialist

## 2016-12-05 DIAGNOSIS — Z5181 Encounter for therapeutic drug level monitoring: Secondary | ICD-10-CM

## 2016-12-05 DIAGNOSIS — I4891 Unspecified atrial fibrillation: Secondary | ICD-10-CM

## 2016-12-05 DIAGNOSIS — Z7901 Long term (current) use of anticoagulants: Secondary | ICD-10-CM

## 2016-12-14 ENCOUNTER — Ambulatory Visit (INDEPENDENT_AMBULATORY_CARE_PROVIDER_SITE_OTHER): Payer: Medicare Other | Admitting: Physician Assistant

## 2016-12-14 ENCOUNTER — Encounter: Payer: Self-pay | Admitting: Physician Assistant

## 2016-12-14 VITALS — BP 104/62 | HR 80 | Ht 65.0 in | Wt 122.0 lb

## 2016-12-14 DIAGNOSIS — I10 Essential (primary) hypertension: Secondary | ICD-10-CM

## 2016-12-14 DIAGNOSIS — N186 End stage renal disease: Secondary | ICD-10-CM | POA: Diagnosis not present

## 2016-12-14 DIAGNOSIS — I481 Persistent atrial fibrillation: Secondary | ICD-10-CM | POA: Diagnosis not present

## 2016-12-14 DIAGNOSIS — I350 Nonrheumatic aortic (valve) stenosis: Secondary | ICD-10-CM | POA: Diagnosis not present

## 2016-12-14 DIAGNOSIS — I4819 Other persistent atrial fibrillation: Secondary | ICD-10-CM

## 2016-12-14 MED ORDER — AMIODARONE HCL 200 MG PO TABS: 100.0000 mg | ORAL_TABLET | Freq: Every day | ORAL | 1 refills | Status: DC

## 2016-12-14 NOTE — Progress Notes (Signed)
Cardiology Office Note   Date:  12/16/2016   ID:  [name], DOB [birth year], MRN [id number]  PCP:  Levin Erp, MD  Cardiologist:  Dr. Percival Spanish, 10/28/2016  Barbara Ferries, PA-C   Chief Complaint  Patient presents with  . Follow-up    INR questions    History of Present Illness: [name] is a 83 y.o. female with a history of AS, ESRD on HD, HTN, OSA, TIA, atrial fibrillation on warfarin, bradycardia on metoprolol 12.5 mg twice a day  11/23/2016 Hypertension Clinic visit. Amlodipine had been discontinued, possibly secondary to low blood pressure during dialysis, heart rate was greater than 100 so Lopressor was resumed at 12.5 mg once a day  [name] presents for cardiology follow up. She is here with her daughter. Her daughter feels her INR is being checked too often (6 checks in the last month). She wonders who is following it.  The renal team changed the Lopressor to Toprol XL 12.5 mg qd. They also d/c'd the Lipitor, saying that she did not need it at her age.   She is still having problems with hypotension at HD. They increased her dry weight yesterday 53.5 kg to 54 kg. She never gets light-headed or dizzy. Her BP has gone into the 70s at times in HDBut she is asymptomatic, probably because she is lying still.  She never gets light-headed or dizzy at home. When she checks her BP at home, it is never less than 100.  Her HR has been up to 105, lowest she has seen is in the 90s.   She does not put on a great deal of weight between HD appts.   She never gets chest pain. She is able to go up steps, mop, vacuum and do other things without CP/SOB.     Past Medical History:  Diagnosis Date  . Anemia   . Aortic stenosis   . Benign bladder mass   . Chronic cough   . DJD (degenerative joint disease)   . Dyslipidemia   . ESRD on hemodialysis ([place])    "TTS; [place]" (09/27/2016)  . Gout   . HTN (hypertension)   . Obstructive sleep apnea    mild   . Orthostatic syncope    around year 2006  . Pneumonia   . TIA (transient ischemic attack)     Past Surgical History:  Procedure Laterality Date  . BONE MARROW BIOPSY    . CATARACT EXTRACTION W/ INTRAOCULAR LENS  IMPLANT, BILATERAL    . COLONOSCOPY W/ BIOPSIES AND POLYPECTOMY    . left arm dialysis graft    . NEPHRECTOMY     left  . REVISION OF ARTERIOVENOUS GORETEX GRAFT Right 11/24/2014   Procedure: REPLACEMENT OF  MEDIAL SIDE OF RIGHT FORARM ARTERIOVENOUS GORETEX GRAFT;  Surgeon: Conrad [place], MD;  Location: [place];  Service: Vascular;  Laterality: Right;  . REVISION OF ARTERIOVENOUS GORETEX GRAFT Right 11/28/2015   Procedure: REVISION OF RIGHT ARM ARTERIOVENOUS GORETEX GRAFT;  Surgeon: Serafina Mitchell, MD;  Location: [place];  Service: Vascular;  Laterality: Right;  . TOTAL ABDOMINAL HYSTERECTOMY      Current Outpatient Prescriptions  Medication Sig Dispense Refill  . acetaminophen (TYLENOL) 500 MG tablet Take 500 mg by mouth every 6 (six) hours as needed.    Marland Kitchen amiodarone (PACERONE) 200 MG tablet Take 0.5 tablets (100 mg total) by mouth daily. 30 tablet 5  . metoprolol succinate (TOPROL-XL) 25 MG 24 hr tablet  Take 12.5 mg by mouth daily.    . multivitamin (RENA-VIT) TABS tablet Take 1 tablet by mouth daily.    . SENSIPAR 30 MG tablet Take 30 mg by mouth daily.     Marland Kitchen warfarin (COUMADIN) 2.5 MG tablet Take 1 to 1.5 tablets by mouth daily as directed by coumadin clinic 45 tablet 3   No current facility-administered medications for this visit.     Allergies:   Tape and Sulfa antibiotics    Social History:  The patient  reports that she has never smoked. She has never used smokeless tobacco. She reports that she does not drink alcohol or use drugs.   Family History:  The patient's family history includes Diabetes in her mother; Hypertension in her mother and other.    ROS:  Please see the history of present illness. All other systems are reviewed and negative.    PHYSICAL  EXAM: VS:  BP 104/62   Pulse 80   Ht _0  (1.651 m)   Wt 122 lb (55.3 kg)   SpO2 98%   BMI 20.30 kg/m  , BMI Body mass index is 20.3 kg/m. GEN: Well nourished, well developed, female in no acute distress  HEENT: normal for age  Neck: JVD 8-9 cm, no carotid bruit, no masses Cardiac: slightly irregular R&R; 2/6 murmur, no rubs, or gallops Respiratory: few rales bases bilaterally, normal work of breathing GI: soft, nontender, nondistended, + BS MS: no deformity or atrophy; no edema; distal pulses are 2+ in all 4 extremities   Skin: warm and dry, no rash Neuro:  Strength and sensation are intact Psych: euthymic mood, full affect   EKG:  EKG is not ordered today  ECHO: 11/29/2015 - Left ventricle: The cavity size was normal. There was mild focal   basal hypertrophy of the septum. Systolic function was normal.   The estimated ejection fraction was in the range of 60% to 65%.   Wall motion was normal; there were no regional wall motion   abnormalities. Features are consistent with a pseudonormal left   ventricular filling pattern, with concomitant abnormal relaxation   and increased filling pressure (grade 2 diastolic dysfunction). - Aortic valve: Trileaflet; moderately thickened, moderately   calcified leaflets. There was severe stenosis. There was mild   regurgitation. Mean gradient (S): 35 mm Hg. Peak gradient (S): 67   mm Hg. VTI ratio of LVOT to aortic valve: 0.31. Valve area (VTI):   1.06 cm^2. Valve area (Vmax): 1.01 cm^2. Valve area (Vmean): 0.99   cm^2. - Mitral valve: Calcified annulus. Mildly calcified leaflets .   There was trivial regurgitation. - Left atrium: The atrium was mildly to moderately dilated. - Right atrium: Central venous pressure (est): 8 mm Hg. - Atrial septum: No defect or patent foramen ovale was identified. - Tricuspid valve: There was mild regurgitation. - Pulmonary arteries: Systolic pressure was severely increased. PA   peak pressure: 62 mm Hg  (S). - Pericardium, extracardiac: A trivial pericardial effusion was   identified posterior to the heart. Impressions: - Mild basal septal LV hypertrophy with LVEF 60-65%. Grade 2   diastolic dysfunction with increased LV filling pressure. Mild to   moderate left atrial enlargement. MAC with mildly calcified   mitral leaflets and trivial mitral regurgitation. Severe calcific   aortic stenosis as outlined above with mild aortic regurgitation.   Mild tricuspid regurgitation with evidence of severe pulmonary   hypertension, PA S/P estimated 62 mmHg. Trivial posterior   pericardial effusion. No definitive  valvular vegetations noted.  Recent Labs: 08/20/2016: TSH 2.400 09/27/2016: B Natriuretic Peptide 1,532.2; Magnesium 2.2 11/08/2016: ALT 16; BUN 18; Creatinine, Ser 2.90; Hemoglobin 12.9; Platelets 106; Potassium 4.2; Sodium 136    Lipid Panel    Component Value Date/Time   CHOL 143 08/21/2016 0429   TRIG 62 08/21/2016 0429   HDL 68 08/21/2016 0429   CHOLHDL 2.1 08/21/2016 0429   VLDL 12 08/21/2016 0429   LDLCALC 63 08/21/2016 0429     Wt Readings from Last 3 Encounters:  12/14/16 122 lb (55.3 kg)  11/08/16 124 lb 5.4 oz (56.4 kg)  10/28/16 121 lb 3.2 oz (55 kg)     Other studies Reviewed: Additional studies/ records that were reviewed today include: office notes, hospital records and testing.  ASSESSMENT AND PLAN:  1.  Persistent atrial fib/flutter: HR is controlled and she is anticoagulated. No sx attributed to the afib/flutter Continue low-dose amiodarone, BB and warfarin  2. Chronic anticoagulation: Contact Pharmacy team, they will clarify coumadin checks.  3. HTN: Pt BP has been dropping during HD. She takes her Toprol XL 25 mg qhs. Continue this.   4. ESRD on HD: She has some mild volume overload on exam, but her dry weight was increased last session. Per Renal team.  5. AS: Severe by echo 2017.  Volume management with HD. Consider TAVR in the future if sx not  controlled, but I'm not sure she and her family would want any procedures.  Current medicines are reviewed at length with the patient today.  The patient has concerns regarding medicines. Concerns were addressed  The following changes have been made:  no change  Labs/ tests ordered today include:  No orders of the defined types were placed in this encounter.    Disposition:   FU with Dr. Percival Spanish  Signed, Barbara Ferries, PA-C  12/16/2016 9:30 AM    [place] Phone: [phone]; Fax: [phone]  This note was written with the assistance of speech recognition software. Please excuse any transcriptional errors.

## 2016-12-14 NOTE — Patient Instructions (Signed)
Medication Instructions:  Your physician recommends that you continue on your current medications as directed. Please refer to the Current Medication list given to you today. If you need a refill on your cardiac medications before your next appointment, please call your pharmacy.  Follow-Up: Your physician wants you to follow-up in: [place].  Thank you for choosing [facility] at [facility]!!

## 2016-12-15 LAB — PROTIME-INR: INR: 2 — AB (ref <=1.1)

## 2016-12-19 ENCOUNTER — Ambulatory Visit (INDEPENDENT_AMBULATORY_CARE_PROVIDER_SITE_OTHER): Payer: Medicare Other | Admitting: Pharmacist Clinician (PhC)/ Clinical Pharmacy Specialist

## 2016-12-19 DIAGNOSIS — Z5181 Encounter for therapeutic drug level monitoring: Secondary | ICD-10-CM

## 2016-12-19 DIAGNOSIS — Z7901 Long term (current) use of anticoagulants: Secondary | ICD-10-CM

## 2016-12-19 DIAGNOSIS — I4891 Unspecified atrial fibrillation: Secondary | ICD-10-CM

## 2016-12-29 LAB — PROTIME-INR: INR: 3 — AB (ref 0.9–1.1)

## 2017-01-02 ENCOUNTER — Ambulatory Visit (INDEPENDENT_AMBULATORY_CARE_PROVIDER_SITE_OTHER): Payer: Medicare Other | Admitting: Pharmacist Clinician (PhC)/ Clinical Pharmacy Specialist

## 2017-01-02 DIAGNOSIS — Z5181 Encounter for therapeutic drug level monitoring: Secondary | ICD-10-CM

## 2017-01-02 DIAGNOSIS — Z7901 Long term (current) use of anticoagulants: Secondary | ICD-10-CM

## 2017-01-02 DIAGNOSIS — I4891 Unspecified atrial fibrillation: Secondary | ICD-10-CM

## 2017-01-05 ENCOUNTER — Telehealth: Payer: Self-pay | Admitting: Pharmacist Clinician (PhC)/ Clinical Pharmacy Specialist

## 2017-01-05 NOTE — Telephone Encounter (Signed)
Coumadin letter 

## 2017-01-06 ENCOUNTER — Telehealth: Payer: Self-pay | Admitting: Physician Assistant

## 2017-01-06 NOTE — Telephone Encounter (Signed)
Received incoming records from [facility] for upcoming appointment on 01/23/17 @ 8:00am with [name]. Records given to [facility] in Medical Records. 01/06/17/ab

## 2017-01-10 LAB — PROTIME-INR: INR: 2.8 — AB (ref 0.9–1.1)

## 2017-01-12 ENCOUNTER — Encounter: Payer: Self-pay | Admitting: Pharmacist

## 2017-01-12 ENCOUNTER — Ambulatory Visit (INDEPENDENT_AMBULATORY_CARE_PROVIDER_SITE_OTHER): Payer: Medicare Other | Admitting: Pharmacist

## 2017-01-12 DIAGNOSIS — I4891 Unspecified atrial fibrillation: Secondary | ICD-10-CM

## 2017-01-12 DIAGNOSIS — Z7901 Long term (current) use of anticoagulants: Secondary | ICD-10-CM

## 2017-01-12 DIAGNOSIS — Z5181 Encounter for therapeutic drug level monitoring: Secondary | ICD-10-CM | POA: Diagnosis not present

## 2017-01-23 ENCOUNTER — Encounter: Payer: Self-pay | Admitting: Physician Assistant

## 2017-01-23 ENCOUNTER — Ambulatory Visit (INDEPENDENT_AMBULATORY_CARE_PROVIDER_SITE_OTHER): Payer: Medicare Other | Admitting: Physician Assistant

## 2017-01-23 VITALS — BP 102/58 | HR 100 | Ht 63.0 in | Wt 127.0 lb

## 2017-01-23 DIAGNOSIS — I959 Hypotension, unspecified: Secondary | ICD-10-CM | POA: Diagnosis not present

## 2017-01-23 DIAGNOSIS — Z7901 Long term (current) use of anticoagulants: Secondary | ICD-10-CM | POA: Diagnosis not present

## 2017-01-23 DIAGNOSIS — I480 Paroxysmal atrial fibrillation: Secondary | ICD-10-CM | POA: Diagnosis not present

## 2017-01-23 DIAGNOSIS — I350 Nonrheumatic aortic (valve) stenosis: Secondary | ICD-10-CM

## 2017-01-23 NOTE — Progress Notes (Signed)
Cardiology Office Note   Date:  01/23/2017   ID:  KNOX HOLDMAN, DOB 03/07/1922, MRN [id number]  PCP:  Levin Erp, MD  Cardiologist:  Dr. Percival Spanish, 10/28/2016 [name], PA-C 12/14/2016  Chief Complaint  Patient presents with  . BP follow up    patient states [facility] made this appt for her. she states her BP gets low at dialysis (as low as 79 systolic). no complaints    History of Present Illness: [name] is a 83 y.o. female with a history of  AS, ESRD on HD, HTN, OSA, TIA, atrial fibrillation on warfarin, bradycardia on BB (tolerates Toprol XL 12.5 mg qd), RBBB  12/14/2016 office visit, patient tolerating low-dose beta blocker except that her blood pressure was dropping during dialysis. Dry weight had been increased. No change in medications, Coumadin checks clarified.  [name] presents for Cardiology follow up.  Her BP drops at HD, but she is not aware of it.   She does not feel limited in what she can do by her breathing. Her dry weight is now 55 kg.  She never gets light-headed or dizzy, never has presyncope or feels in danger of falling.   She feels tired at times, has taken her BP during these episodes. She thinks her BP has been low, in the 80s she thinks, when she feels like this. She is not sure how long the tired feeling is lasting, it is not associated with shortness of breath, chest pain or palpitations.  She is not over drinking and does not gain too much weight in between dialysis appointments.   Past Medical History:  Diagnosis Date  . Anemia   . Aortic stenosis   . Benign bladder mass   . Chronic cough   . DJD (degenerative joint disease)   . Dyslipidemia   . ESRD on hemodialysis ([place])    "TTS; [place]" (09/27/2016)  . Gout   . HTN (hypertension)   . Obstructive sleep apnea    mild  . Orthostatic syncope    around year 2006  . Pneumonia   . TIA (transient ischemic attack)     Past  Surgical History:  Procedure Laterality Date  . BONE MARROW BIOPSY    . CATARACT EXTRACTION W/ INTRAOCULAR LENS  IMPLANT, BILATERAL    . COLONOSCOPY W/ BIOPSIES AND POLYPECTOMY    . left arm dialysis graft    . NEPHRECTOMY     left  . REVISION OF ARTERIOVENOUS GORETEX GRAFT Right 11/24/2014   Procedure: REPLACEMENT OF  MEDIAL SIDE OF RIGHT FORARM ARTERIOVENOUS GORETEX GRAFT;  Surgeon: Conrad [place], MD;  Location: [place];  Service: Vascular;  Laterality: Right;  . REVISION OF ARTERIOVENOUS GORETEX GRAFT Right 11/28/2015   Procedure: REVISION OF RIGHT ARM ARTERIOVENOUS GORETEX GRAFT;  Surgeon: Serafina Mitchell, MD;  Location: [place];  Service: Vascular;  Laterality: Right;  . TOTAL ABDOMINAL HYSTERECTOMY      Current Outpatient Prescriptions  Medication Sig Dispense Refill  . acetaminophen (TYLENOL) 500 MG tablet Take 500 mg by mouth every 6 (six) hours as needed.    Marland Kitchen amiodarone (PACERONE) 200 MG tablet Take 0.5 tablets (100 mg total) by mouth daily. 90 tablet 1  . metoprolol succinate (TOPROL-XL) 25 MG 24 hr tablet Take 12.5 mg by mouth daily.    . multivitamin (RENA-VIT) TABS tablet Take 1 tablet by mouth daily.    . SENSIPAR 30 MG tablet Take 30 mg by mouth daily.     Marland Kitchen  warfarin (COUMADIN) 2.5 MG tablet Take 1 to 1.5 tablets by mouth daily as directed by coumadin clinic 45 tablet 3   No current facility-administered medications for this visit.     Allergies:   Tape and Sulfa antibiotics    Social History:  The patient  reports that she has never smoked. She has never used smokeless tobacco. She reports that she does not drink alcohol or use drugs.   Family History:  The patient's family history includes Diabetes in her mother; Hypertension in her mother and other.    ROS:  Please see the history of present illness. All other systems are reviewed and negative.    PHYSICAL EXAM: VS:  BP (!) 102/58 (BP Location: Left Arm, Patient Position: Sitting, Cuff Size: Normal)   Pulse 100    Ht 5' 3"  (1.6 m)   Wt 127 lb (57.6 kg)   BMI 22.50 kg/m  , BMI Body mass index is 22.5 kg/m. GEN: Well nourished, well developed, female in no acute distress  HEENT: normal for age  Neck: JVD 11 cm, no carotid bruit, no masses Cardiac: RRR; AS murmur, no rubs, or gallops Respiratory: Rales bases bilaterally, normal work of breathing GI: soft, nontender, nondistended, + BS MS: no deformity or atrophy; no edema; distal pulses are 2+ in all 4 extremities (radial pulses not noted in right upper extremity because her dialysis graft is in that arm, but there is a good pulse in the graft itself)   Skin: warm and dry, no rash Neuro:  Strength and sensation are intact Psych: euthymic mood, full affect   EKG:  EKG is not ordered today.   Recent Labs: 08/20/2016: TSH 2.400 09/27/2016: B Natriuretic Peptide 1,532.2; Magnesium 2.2 11/08/2016: ALT 16; BUN 18; Creatinine, Ser 2.90; Hemoglobin 12.9; Platelets 106; Potassium 4.2; Sodium 136    Lipid Panel    Component Value Date/Time   CHOL 143 08/21/2016 0429   TRIG 62 08/21/2016 0429   HDL 68 08/21/2016 0429   CHOLHDL 2.1 08/21/2016 0429   VLDL 12 08/21/2016 0429   LDLCALC 63 08/21/2016 0429     Wt Readings from Last 3 Encounters:  01/23/17 127 lb (57.6 kg)  12/14/16 122 lb (55.3 kg)  11/08/16 124 lb 5.4 oz (56.4 kg)     Other studies Reviewed: Additional studies/ records that were reviewed today include: Office notes, hospital records and testing.  ASSESSMENT AND PLAN:  1.  Hypotension: I discussed the situation with the patient and her daughter. I'm concerned that the tired feeling she has at home is associated with hypotension. Especially because of her aortic stenosis, I would like to keep her systolic blood pressure 536 or better. I advised that I felt the risk of the beta blocker was better than the benefit at this time. We will therefore discontinue it. The patient is encouraged to check her blood pressure and heart rate anytime  she gets that "tired" feeling. If her blood pressure continues to run low off the metoprolol, she may need midodrine.  2. PAF: I explained that the risk of her having more atrial fibrillation is a little higher off the beta blocker. However, stated we could continue the amiodarone. Although she is a little bit more likely to have atrial fibrillation off the metoprolol, I advised the risk was very low because dose was so small. I felt that this is an acceptable risk. If she has any more atrial fibrillation, she is to let us know. It is okay to take  metoprolol 12.5 mg when necessary for heart rate sustained greater than 120  3. Severe aortic stenosis: Explained that TAVR was procedure that could be considered for her, but I'm not sure she is candidate. I reviewed briefly what the procedure is. The patient and her daughter not interested.  4. Chronic anticoagulation with Coumadin: She has been therapeutic.  5. CODE STATUS: I reviewed with the patient and her daughter the different options for CODE STATUS and she wishes to BE a full code.   Current medicines are reviewed at length with the patient today.  The patient has concerns regarding medicines.  The following changes have been made:  Change metoprolol to when necessary  Labs/ tests ordered today include:  No orders of the defined types were placed in this encounter.    Disposition:   FU with Dr. Percival Spanish   Signed, [name], PA-C  01/23/2017 11:02 AM    [place] Phone: [phone]; Fax: [phone]  This note was written with the assistance of speech recognition software. Please excuse any transcriptional errors.

## 2017-01-23 NOTE — Patient Instructions (Addendum)
Suanne Marker, PA has recommended you make the following change in your medication:  -- metoprolol succinate 1/2 tablet as needed - elevated (greater than 110) or irregular heart rate  Keep scheduled follow up with Dr. Percival Spanish on [facility] 9 @ 9:20am

## 2017-01-26 LAB — PROTIME-INR: INR: 3 — AB (ref 0.9–1.1)

## 2017-01-30 ENCOUNTER — Ambulatory Visit (INDEPENDENT_AMBULATORY_CARE_PROVIDER_SITE_OTHER): Payer: Medicare Other | Admitting: Pharmacist Clinician (PhC)/ Clinical Pharmacy Specialist

## 2017-01-30 DIAGNOSIS — I4891 Unspecified atrial fibrillation: Secondary | ICD-10-CM

## 2017-01-30 DIAGNOSIS — Z5181 Encounter for therapeutic drug level monitoring: Secondary | ICD-10-CM | POA: Diagnosis not present

## 2017-01-30 DIAGNOSIS — Z7901 Long term (current) use of anticoagulants: Secondary | ICD-10-CM | POA: Diagnosis not present

## 2017-02-07 LAB — PROTIME-INR: INR: 2.6 — AB (ref <=1.1)

## 2017-02-09 ENCOUNTER — Ambulatory Visit (INDEPENDENT_AMBULATORY_CARE_PROVIDER_SITE_OTHER): Payer: Medicare Other | Admitting: Pharmacist

## 2017-02-09 ENCOUNTER — Encounter (HOSPITAL_COMMUNITY): Payer: Self-pay

## 2017-02-09 ENCOUNTER — Emergency Department (HOSPITAL_COMMUNITY): Payer: Medicare Other

## 2017-02-09 ENCOUNTER — Encounter: Payer: Self-pay | Admitting: Pharmacist

## 2017-02-09 ENCOUNTER — Observation Stay (HOSPITAL_COMMUNITY)
Admission: EM | Admit: 2017-02-09 | Discharge: 2017-02-10 | Disposition: A | Discharge status: Home / Self Care | Payer: Medicare Other | Attending: Family Medicine | Admitting: Family Medicine

## 2017-02-09 DIAGNOSIS — G4733 Obstructive sleep apnea (adult) (pediatric): Secondary | ICD-10-CM | POA: Insufficient documentation

## 2017-02-09 DIAGNOSIS — I480 Paroxysmal atrial fibrillation: Secondary | ICD-10-CM | POA: Diagnosis present

## 2017-02-09 DIAGNOSIS — N186 End stage renal disease: Secondary | ICD-10-CM | POA: Diagnosis not present

## 2017-02-09 DIAGNOSIS — Z7901 Long term (current) use of anticoagulants: Secondary | ICD-10-CM

## 2017-02-09 DIAGNOSIS — Z8673 Personal history of transient ischemic attack (TIA), and cerebral infarction without residual deficits: Secondary | ICD-10-CM | POA: Insufficient documentation

## 2017-02-09 DIAGNOSIS — D649 Anemia, unspecified: Secondary | ICD-10-CM | POA: Insufficient documentation

## 2017-02-09 DIAGNOSIS — M109 Gout, unspecified: Secondary | ICD-10-CM | POA: Insufficient documentation

## 2017-02-09 DIAGNOSIS — E861 Hypovolemia: Secondary | ICD-10-CM | POA: Diagnosis not present

## 2017-02-09 DIAGNOSIS — Z79899 Other long term (current) drug therapy: Secondary | ICD-10-CM | POA: Insufficient documentation

## 2017-02-09 DIAGNOSIS — I350 Nonrheumatic aortic (valve) stenosis: Secondary | ICD-10-CM | POA: Diagnosis not present

## 2017-02-09 DIAGNOSIS — Z5181 Encounter for therapeutic drug level monitoring: Secondary | ICD-10-CM

## 2017-02-09 DIAGNOSIS — M1990 Unspecified osteoarthritis, unspecified site: Secondary | ICD-10-CM | POA: Insufficient documentation

## 2017-02-09 DIAGNOSIS — I10 Essential (primary) hypertension: Secondary | ICD-10-CM | POA: Diagnosis present

## 2017-02-09 DIAGNOSIS — E872 Acidosis, unspecified: Secondary | ICD-10-CM | POA: Diagnosis present

## 2017-02-09 DIAGNOSIS — I120 Hypertensive chronic kidney disease with stage 5 chronic kidney disease or end stage renal disease: Secondary | ICD-10-CM | POA: Insufficient documentation

## 2017-02-09 DIAGNOSIS — E785 Hyperlipidemia, unspecified: Secondary | ICD-10-CM | POA: Insufficient documentation

## 2017-02-09 DIAGNOSIS — Z992 Dependence on renal dialysis: Secondary | ICD-10-CM | POA: Insufficient documentation

## 2017-02-09 DIAGNOSIS — I959 Hypotension, unspecified: Secondary | ICD-10-CM | POA: Diagnosis not present

## 2017-02-09 DIAGNOSIS — I700 Atherosclerosis of aorta: Secondary | ICD-10-CM | POA: Diagnosis not present

## 2017-02-09 DIAGNOSIS — K769 Liver disease, unspecified: Secondary | ICD-10-CM | POA: Insufficient documentation

## 2017-02-09 DIAGNOSIS — R531 Weakness: Secondary | ICD-10-CM

## 2017-02-09 DIAGNOSIS — I4891 Unspecified atrial fibrillation: Secondary | ICD-10-CM

## 2017-02-09 LAB — COMPREHENSIVE METABOLIC PANEL
ALK PHOS: 58 U/L (ref 38–126)
ALT: 15 U/L (ref 14–54)
ANION GAP: 15 (ref 5–15)
AST: 26 U/L (ref 15–41)
Albumin: 3.8 g/dL (ref 3.5–5.0)
BILIRUBIN TOTAL: 0.9 mg/dL (ref 0.3–1.2)
BUN: 12 mg/dL (ref 6–20)
CALCIUM: 9.9 mg/dL (ref 8.9–10.3)
CO2: 29 mmol/L (ref 22–32)
Chloride: 94 mmol/L — ABNORMAL LOW (ref 101–111)
Creatinine, Ser: 3.41 mg/dL — ABNORMAL HIGH (ref 0.44–1.00)
GFR calc non Af Amer: 11 mL/min — ABNORMAL LOW (ref >60)
GFR, EST AFRICAN AMERICAN: 12 mL/min — AB (ref >60)
Glucose, Bld: 81 mg/dL (ref 65–99)
Potassium: 3.9 mmol/L (ref 3.5–5.1)
SODIUM: 138 mmol/L (ref 135–145)
TOTAL PROTEIN: 6.8 g/dL (ref 6.5–8.1)

## 2017-02-09 LAB — CBC WITH DIFFERENTIAL/PLATELET
BASOS ABS: 0 10*3/uL (ref 0.0–0.1)
BASOS PCT: 0 %
Eosinophils Absolute: 0.1 10*3/uL (ref 0.0–0.7)
Eosinophils Relative: 1 %
HEMATOCRIT: 37.2 % (ref 36.0–46.0)
HEMOGLOBIN: 11.8 g/dL — AB (ref 12.0–15.0)
Lymphocytes Relative: 32 %
Lymphs Abs: 2.1 10*3/uL (ref 0.7–4.0)
MCH: 31.9 pg (ref 26.0–34.0)
MCHC: 31.7 g/dL (ref 30.0–36.0)
MCV: 100.5 fL — ABNORMAL HIGH (ref 78.0–100.0)
Monocytes Absolute: 0.4 10*3/uL (ref 0.1–1.0)
Monocytes Relative: 6 %
NEUTROS ABS: 4.2 10*3/uL (ref 1.7–7.7)
NEUTROS PCT: 61 %
Platelets: 123 10*3/uL — ABNORMAL LOW (ref 150–400)
RBC: 3.7 MIL/uL — AB (ref 3.87–5.11)
RDW: 15 % (ref 11.5–15.5)
WBC: 6.7 10*3/uL (ref 4.0–10.5)

## 2017-02-09 LAB — PROTIME-INR
INR: 2.25
PROTHROMBIN TIME: 24.7 s — AB (ref 11.4–15.2)

## 2017-02-09 LAB — I-STAT CG4 LACTIC ACID, ED: Lactic Acid, Venous: 2.99 mmol/L (ref 0.5–1.9)

## 2017-02-09 LAB — I-STAT TROPONIN, ED: Troponin i, poc: 0.05 ng/mL (ref 0.00–0.08)

## 2017-02-09 MED ORDER — IOPAMIDOL (ISOVUE-300) INJECTION 61%
INTRAVENOUS | Status: AC
  Filled 2017-02-09: qty 30

## 2017-02-09 MED ORDER — SODIUM CHLORIDE 0.9 % IV BOLUS (SEPSIS)
1000.0000 mL | Freq: Once | INTRAVENOUS | Status: AC
  Administered 2017-02-09: 1000 mL via INTRAVENOUS

## 2017-02-09 NOTE — ED Provider Notes (Signed)
[facility] DEPT Provider Note   CSN: [id number] Arrival date & time: 02/09/17  2021     History   Chief Complaint Chief Complaint  Patient presents with  . Hypotension    HPI [name] is a 83 y.o. female.  83 year old female history of ESRD on HD TTS, HTN, HLD, aortic stenosis, A. fib on Coumadin who presents with hypotension and generalized malaise.  Patient was dialyzed earlier today, and received normal session.  She was near her dry weight of 56 kg.  Reportedly normotensive to 100s/60s on discharge.  She returned home and despite eating food was noted to be generally weak and somnolent.  Patient's daughter called EMS.  BP was noted to be systolics 69S-85I.  Patient has had recent low blood pressures to systolics of 62V and follows with cardiology.  Metoprolol was discontinued approximately 2 weeks ago.  Patient was originally started on Coumadin and metoprolol in April 2018 following A. fib with RVR.  She denies fevers, cough, congestion, or other infectious symptoms.  She does not make urine.  Endorses mild suprapubic tenderness.  The history is provided by the patient, a relative and medical records. No language interpreter was used.    Past Medical History:  Diagnosis Date  . Anemia   . Aortic stenosis   . Benign bladder mass   . Chronic cough   . DJD (degenerative joint disease)   . Dyslipidemia   . ESRD on hemodialysis ([place])    "TTS; [place]" (09/27/2016)  . Gout   . HTN (hypertension)   . Obstructive sleep apnea    mild  . Orthostatic syncope    around year 2006  . Pneumonia   . TIA (transient ischemic attack)     Patient Active Problem List   Diagnosis Date Noted  . Hypotension 02/10/2017  . Lactic acidosis 02/10/2017  . Bradycardia 10/29/2016  . Acute respiratory failure ([place]) 09/27/2016  . Atypical chest pain 09/27/2016  . Essential hypertension 09/27/2016  . Acute pulmonary edema ([facility])   . Other chest pain   . Coumadin Rx 09/07/2016    . Monitoring for long-term anticoagulant use 08/29/2016  . Atrial flutter ([place])   . PAF (paroxysmal atrial fibrillation) ([place]) 08/22/2016  . Atrial fibrillation with RVR ([place]) 08/20/2016  . Severe aortic stenosis 08/20/2016  . New onset a-fib ([place]) 08/20/2016  . Staphylococcus aureus bacteremia   . 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 Bleeding from dialysis shunt ([place])   . Nausea   . Fever in adult 11/27/2015  . Hemorrhage of arteriovenous graft ([place]) 11/27/2015  . HLD (hyperlipidemia) 11/27/2015  . Bleeding 11/27/2015  . Influenza A with respiratory manifestations 05/08/2013  . Cough 05/08/2013  . Chronic cough 02/15/2013  . Secondary hyperparathyroidism ([place]) 11/21/2012  . ESRD on dialysis [facility]) 11/20/2012    Past Surgical History:  Procedure Laterality Date  . BONE MARROW BIOPSY    . CATARACT EXTRACTION W/ INTRAOCULAR LENS  IMPLANT, BILATERAL    . COLONOSCOPY W/ BIOPSIES AND POLYPECTOMY    . left arm dialysis graft    . NEPHRECTOMY     left  . REVISION OF ARTERIOVENOUS GORETEX GRAFT Right 11/24/2014   Procedure: REPLACEMENT OF  MEDIAL SIDE OF RIGHT FORARM ARTERIOVENOUS GORETEX GRAFT;  Surgeon: Conrad [place], MD;  Location: [facility];  Service: Vascular;  Laterality: Right;  . REVISION OF ARTERIOVENOUS GORETEX GRAFT Right 11/28/2015   Procedure: REVISION OF RIGHT ARM ARTERIOVENOUS GORETEX GRAFT;  Surgeon: Butch Penny  Trula Slade, MD;  Location: [facility];  Service: Vascular;  Laterality: Right;  . TOTAL ABDOMINAL HYSTERECTOMY      OB History    No data available       Home Medications    Prior to Admission medications   Medication Sig Start Date End Date Taking? Authorizing Provider  acetaminophen (TYLENOL) 500 MG tablet Take 500 mg by mouth every 6 (six) hours as needed.   Yes [provider]  amiodarone (PACERONE) 200 MG tablet Take 0.5 tablets (100 mg total) by mouth daily. 12/14/16  Yes Barrett, Evelene Croon, PA-C   metoprolol succinate (TOPROL-XL) 25 MG 24 hr tablet Take 12.5 mg by mouth daily as needed. irregular or high heart rate 11/29/16  Yes [provider]  multivitamin (RENA-VIT) TABS tablet Take 1 tablet by mouth daily.   Yes [provider]  SENSIPAR 30 MG tablet Take 30 mg by mouth daily.  02/14/13  Yes [provider]  warfarin (COUMADIN) 2.5 MG tablet Take 1 to 1.5 tablets by mouth daily as directed by coumadin clinic Patient taking differently: Take 3.75 mg by mouth at bedtime.  11/11/16  Yes Minus Breeding, MD    Family History Family History  Problem Relation Age of Onset  . Hypertension Mother   . Diabetes Mother   . Hypertension Other     Social History Social History  Substance Use Topics  . Smoking status: Never Smoker  . Smokeless tobacco: Never Used  . Alcohol use No     Allergies   Tape and Sulfa antibiotics   Review of Systems Review of Systems  Constitutional: Positive for fatigue. Negative for chills and fever.  HENT: Negative for ear pain and sore throat.   Eyes: Negative for pain and visual disturbance.  Respiratory: Negative for cough and shortness of breath.   Cardiovascular: Negative for chest pain and palpitations.  Gastrointestinal: Negative for abdominal pain and vomiting.  Genitourinary: Negative for dysuria and hematuria.  Musculoskeletal: Negative for arthralgias and back pain.  Skin: Negative for color change and rash.  Neurological: Negative for seizures and syncope.  All other systems reviewed and are negative.    Physical Exam Updated Vital Signs BP (!) 82/34 (BP Location: Left Arm) Comment: Nurse Catlin aware of low b/p  Pulse 62   Temp 97.6 F (36.4 C) (Oral)   Resp 18   Ht _0  (1.6 m)   Wt 54.6 kg (120 lb 4.8 oz)   SpO2 98%   BMI 21.31 kg/m   Physical Exam  Constitutional: She appears well-developed. No distress.  Tired appearance, converses and responds appropriately  HENT:  Head: Normocephalic  and atraumatic.  Eyes: Conjunctivae are normal.  Neck: Neck supple.  Cardiovascular: Normal rate and regular rhythm.   Murmur heard.  Systolic murmur is present  Pulmonary/Chest: Effort normal and breath sounds normal. No respiratory distress.  Abdominal: Soft. She exhibits no distension. There is tenderness (Mild Suprapubic TTP). There is no guarding.  Musculoskeletal: She exhibits no edema.  Neurological: She is alert. No cranial nerve deficit. Coordination normal.  No focal neuro deficits. 5/5 motor strength and intact sensation in all extremities. Finger-to-nose intact bilaterally  Skin: Skin is warm and dry.  Nursing note and vitals reviewed.    ED Treatments / Results  Labs (all labs ordered are listed, but only abnormal results are displayed) Labs Reviewed  COMPREHENSIVE METABOLIC PANEL - Abnormal; Notable for the following:       Result Value   Chloride 94 (*)  Creatinine, Ser 3.41 (*)    GFR calc non Af Amer 11 (*)    GFR calc Af Amer 12 (*)    All other components within normal limits  CBC WITH DIFFERENTIAL/PLATELET - Abnormal; Notable for the following:    RBC 3.70 (*)    Hemoglobin 11.8 (*)    MCV 100.5 (*)    Platelets 123 (*)    All other components within normal limits  PROTIME-INR - Abnormal; Notable for the following:    Prothrombin Time 24.7 (*)    All other components within normal limits  CORTISOL-AM, BLOOD - Abnormal; Notable for the following:    Cortisol - AM 73.0 (*)    All other components within normal limits  PROTIME-INR - Abnormal; Notable for the following:    Prothrombin Time 24.0 (*)    All other components within normal limits  I-STAT CG4 LACTIC ACID, ED - Abnormal; Notable for the following:    Lactic Acid, Venous 2.99 (*)    All other components within normal limits  I-STAT CG4 LACTIC ACID, ED - Abnormal; Notable for the following:    Lactic Acid, Venous 2.27 (*)    All other components within normal limits  MRSA PCR SCREENING    CULTURE, BLOOD (ROUTINE X 2)  CULTURE, BLOOD (ROUTINE X 2)  LACTIC ACID, PLASMA  URINALYSIS, ROUTINE W REFLEX MICROSCOPIC  I-STAT TROPONIN, ED  I-STAT TROPONIN, ED    EKG  EKG Interpretation None       Radiology Ct Abdomen Pelvis Wo Contrast  Result Date: 02/09/2017 CLINICAL DATA:  Lower abdominal pain starting today. No nausea, vomiting, or diarrhea. EXAM: CT ABDOMEN AND PELVIS WITHOUT CONTRAST TECHNIQUE: Multidetector CT imaging of the abdomen and pelvis was performed following the standard protocol without IV contrast. COMPARISON:  03/26/2012 FINDINGS: Lower chest: Dependent atelectasis versus scarring in the lung bases. Diffuse cardiac enlargement. Calcification in the aortic and mitral valve annulus. Hepatobiliary: Multiple circumscribed low-attenuation lesions in the liver. Largest is in segment 8 and measures about 2.5 cm diameter. These lesions are not significantly changed since previous study. Gallbladder and bile ducts are unremarkable. Pancreas: Unremarkable. No pancreatic ductal dilatation or surrounding inflammatory changes. Spleen: Normal in size without focal abnormality. Adrenals/Urinary Tract: The left kidney is absent, either congenital or surgical. The right kidney is severely atrophic with small cysts present. No hydronephrosis. No adrenal gland nodules. Bladder is decompressed, limiting evaluation. Stomach/Bowel: Stomach, small bowel, and colon are not abnormally distended. Contrast material flows through to the cecum without evidence of bowel obstruction. Appendix is normal. Diverticulosis of the sigmoid colon. No evidence of diverticulitis. Vascular/Lymphatic: Extensive calcification of the aorta, iliac artery's, and celiac axis artery's. No aneurysm. Reproductive: Uterus and bilateral adnexa are unremarkable. Other: No free air or free fluid in the abdomen. Abdominal wall musculature appears intact. Musculoskeletal: Degenerative changes throughout the spine. Increase  mineralization around the lumbar endplates likely represents discogenic degenerative change. Degenerative changes in the hips. Diffuse bone demineralization. IMPRESSION: 1. Multiple low-attenuation lesions in the liver, likely cysts, are unchanged. 2. Severe atrophy of the right kidney.  Absent left kidney. 3. Aortic atherosclerosis with extensive vascular calcifications throughout the abdomen. 4. Diverticulosis of the sigmoid colon. No evidence of diverticulitis. No evidence of bowel obstruction. Electronically Signed   By: Lucienne Capers M.D.   On: 02/09/2017 23:46   Dg Chest Port 1 View  Result Date: 02/09/2017 CLINICAL DATA:  Patient here from home with history of dialysis for acute hypotension. Patient was at home  and became diaphoretic and dizzy. Patient is AANDOx4. BP in the 70s palpated for EMS. EXAM: PORTABLE CHEST 1 VIEW COMPARISON:  11/08/2016 FINDINGS: The heart is enlarged. There are no focal consolidations or pleural effusions. There is mild perihilar peribronchial thickening, stable. No pulmonary edema. Shallow lung inflation and stable elevation of right hemidiaphragm. IMPRESSION: Stable cardiomegaly. Shallow lung inflation Electronically Signed   By: Nolon Nations M.D.   On: 02/09/2017 21:09    Procedures Procedures (including critical care time)  Medications Ordered in ED Medications  iopamidol (ISOVUE-300) 61 % injection (not administered)  amiodarone (PACERONE) tablet 100 mg (not administered)  acetaminophen (TYLENOL) tablet 500 mg (not administered)  multivitamin (RENA-VIT) tablet 1 tablet (not administered)  cinacalcet (SENSIPAR) tablet 30 mg (30 mg Oral Given 02/10/17 0621)  midodrine (PROAMATINE) tablet 2.5 mg (2.5 mg Oral Given 02/10/17 0746)  ondansetron (ZOFRAN) tablet 4 mg (not administered)    Or  ondansetron (ZOFRAN) injection 4 mg (not administered)  zolpidem (AMBIEN) tablet 5 mg (not administered)  Warfarin - Pharmacist Dosing Inpatient (not administered)    feeding supplement (NEPRO CARB STEADY) liquid 237 mL (not administered)  sodium chloride 0.9 % bolus 1,000 mL (0 mLs Intravenous Stopped 02/09/17 2150)  hydrocortisone sodium succinate (SOLU-CORTEF) 100 MG injection 50 mg (50 mg Intravenous Given 02/10/17 0257)  warfarin (COUMADIN) tablet 3.75 mg (3.75 mg Oral Given 02/10/17 0437)     Initial Impression / Assessment and Plan / ED Course  I have reviewed the triage vital signs and the nursing notes.  Pertinent labs & imaging results that were available during my care of the patient were reviewed by me and considered in my medical decision making (see chart for details).     42 yoF h/o ESRD on HD, aortic stenosis, A fib on Coumadin who p/w generalized malaise and undifferentiated hypotension after earlier dialysis session. Afebrile without infectious sx. Metoprolol stopped by Cardiology several weeks ago for similar hypotension. No syncope. No chronic steroid use.  Sepsis labs sent. Doubt lactic acidosis of 3 is from sepsis; no localized infectious source. Code sepsis canceled. BP responsive with fluids. After 1L bolus finished, pressures began to soften again. CXR showing stable cardiomegaly.  CT A/P obtained showing unchanged liver lesions and diverticulosis without evidence of diverticulitis. Unclear etiology for hypotension. Possible relation to dialysis and hypovolemic state. No evidence of hemorrhage or sudden fluid loss. No chronic steroid use, less likely adrenal insufficiency. Pt admitted for further monitoring and evaluation. Pt stable at time of transfer.  Pt care d/w Dr. Rex Kras  Final Clinical Impressions(s) / ED Diagnoses   Final diagnoses:  Hypotension, unspecified hypotension type  ESRD on dialysis [facility])  Generalized weakness    New Prescriptions Current Discharge Medication List       Payton Emerald, MD 02/10/17 Hawley, Wenda Overland, MD 02/17/17 1827

## 2017-02-09 NOTE — ED Notes (Signed)
Dr Rex Kras given a copy of lactic acid results 2.99

## 2017-02-09 NOTE — ED Triage Notes (Signed)
Patient here from home with history of dialysis for acute hypotension.  Patient was at home and became diaphoretic and dizzy.  Patient is A&Ox4.  BP in the 70s palpated for EMS.

## 2017-02-10 DIAGNOSIS — I480 Paroxysmal atrial fibrillation: Secondary | ICD-10-CM

## 2017-02-10 DIAGNOSIS — E872 Acidosis, unspecified: Secondary | ICD-10-CM | POA: Diagnosis present

## 2017-02-10 DIAGNOSIS — N186 End stage renal disease: Secondary | ICD-10-CM | POA: Diagnosis not present

## 2017-02-10 DIAGNOSIS — I10 Essential (primary) hypertension: Secondary | ICD-10-CM

## 2017-02-10 DIAGNOSIS — R531 Weakness: Secondary | ICD-10-CM | POA: Diagnosis not present

## 2017-02-10 DIAGNOSIS — I959 Hypotension, unspecified: Secondary | ICD-10-CM

## 2017-02-10 DIAGNOSIS — Z992 Dependence on renal dialysis: Secondary | ICD-10-CM | POA: Diagnosis not present

## 2017-02-10 DIAGNOSIS — E861 Hypovolemia: Secondary | ICD-10-CM | POA: Diagnosis not present

## 2017-02-10 LAB — MRSA PCR SCREENING: MRSA BY PCR: NEGATIVE

## 2017-02-10 LAB — I-STAT TROPONIN, ED: Troponin i, poc: 0.05 ng/mL (ref 0.00–0.08)

## 2017-02-10 LAB — PROTIME-INR
INR: 2.17
PROTHROMBIN TIME: 24 s — AB (ref 11.4–15.2)

## 2017-02-10 LAB — I-STAT CG4 LACTIC ACID, ED: Lactic Acid, Venous: 2.27 mmol/L (ref 0.5–1.9)

## 2017-02-10 LAB — LACTIC ACID, PLASMA: LACTIC ACID, VENOUS: 1.5 mmol/L (ref 0.5–1.9)

## 2017-02-10 LAB — CORTISOL-AM, BLOOD: Cortisol - AM: 73 ug/dL — ABNORMAL HIGH (ref 6.7–22.6)

## 2017-02-10 MED ORDER — WARFARIN SODIUM 7.5 MG PO TABS
3.7500 mg | ORAL_TABLET | Freq: Once | ORAL | Status: AC
  Administered 2017-02-10: 3.75 mg via ORAL
  Filled 2017-02-10: qty 0.5
  Filled 2017-02-10: qty 1

## 2017-02-10 MED ORDER — MIDODRINE HCL 2.5 MG PO TABS
2.5000 mg | ORAL_TABLET | Freq: Three times a day (TID) | ORAL | Status: DC
  Administered 2017-02-10 (×2): 2.5 mg via ORAL
  Filled 2017-02-10 (×3): qty 1

## 2017-02-10 MED ORDER — ONDANSETRON HCL 4 MG/2ML IJ SOLN: 4.0000 mg | Freq: Four times a day (QID) | INTRAMUSCULAR | Status: DC | PRN

## 2017-02-10 MED ORDER — AMIODARONE HCL 100 MG PO TABS
100.0000 mg | ORAL_TABLET | Freq: Every day | ORAL | Status: DC
  Administered 2017-02-10: 100 mg via ORAL
  Filled 2017-02-10: qty 1

## 2017-02-10 MED ORDER — WARFARIN - PHARMACIST DOSING INPATIENT: Freq: Every day | Status: DC

## 2017-02-10 MED ORDER — HYDROCORTISONE NA SUCCINATE PF 100 MG IJ SOLR
50.0000 mg | Freq: Once | INTRAMUSCULAR | Status: AC
Start: 2017-02-10 — End: 2017-02-10
  Administered 2017-02-10: 50 mg via INTRAVENOUS
  Filled 2017-02-10: qty 2

## 2017-02-10 MED ORDER — CINACALCET HCL 30 MG PO TABS
30.0000 mg | ORAL_TABLET | Freq: Every day | ORAL | Status: DC
  Administered 2017-02-10: 30 mg via ORAL
  Filled 2017-02-10: qty 1

## 2017-02-10 MED ORDER — MIDODRINE HCL 5 MG PO TABS: 10.0000 mg | ORAL_TABLET | Freq: Two times a day (BID) | ORAL | Status: DC

## 2017-02-10 MED ORDER — ONDANSETRON HCL 4 MG PO TABS: 4.0000 mg | ORAL_TABLET | Freq: Four times a day (QID) | ORAL | Status: DC | PRN

## 2017-02-10 MED ORDER — NEPRO/CARBSTEADY PO LIQD
237.0000 mL | Freq: Two times a day (BID) | ORAL | Status: DC
Start: 2017-02-10 — End: 2017-02-10
  Administered 2017-02-10: 237 mL via ORAL
  Filled 2017-02-10 (×4): qty 237

## 2017-02-10 MED ORDER — NEPRO/CARBSTEADY PO LIQD: 237.0000 mL | Freq: Two times a day (BID) | ORAL | 0 refills | Status: DC

## 2017-02-10 MED ORDER — MIDODRINE HCL 10 MG PO TABS: 10.0000 mg | ORAL_TABLET | Freq: Two times a day (BID) | ORAL | 0 refills | Status: DC

## 2017-02-10 MED ORDER — ZOLPIDEM TARTRATE 5 MG PO TABS: 5.0000 mg | ORAL_TABLET | Freq: Every evening | ORAL | Status: DC | PRN

## 2017-02-10 MED ORDER — SODIUM CHLORIDE 0.9 % IV SOLN
INTRAVENOUS | Status: DC
  Administered 2017-02-10: 01:00:00 via INTRAVENOUS

## 2017-02-10 MED ORDER — ACETAMINOPHEN 500 MG PO TABS: 500.0000 mg | ORAL_TABLET | Freq: Four times a day (QID) | ORAL | Status: DC | PRN

## 2017-02-10 MED ORDER — RENA-VITE PO TABS
1.0000 | ORAL_TABLET | Freq: Every day | ORAL | Status: DC
  Administered 2017-02-10: 1 via ORAL
  Filled 2017-02-10: qty 1

## 2017-02-10 NOTE — Discharge Instructions (Signed)
Hypotension Hypotension, commonly called low blood pressure, is when the force of blood pumping through your arteries is too weak. Arteries are blood vessels that carry blood from the heart throughout the body. When blood pressure is too low, you may not get enough blood to your brain or to the rest of your organs. This can cause weakness, light-headedness, rapid heartbeat, and fainting. Depending on the cause and severity, hypotension may be harmless (benign) or cause serious problems (critical). What are the causes? Possible causes of hypotension include:  Blood loss.  Loss of body fluids (dehydration).  Heart problems.  Hormone (endocrine) problems.  Pregnancy.  Severe infection.  Lack of certain nutrients.  Severe allergic reactions (anaphylaxis).  Certain medicines, such as blood pressure medicine or medicines that make the body lose excess fluids (diuretics). Sometimes, hypotension can be caused by not taking medicine as directed, such as taking too much of a certain medicine.  What increases the risk? Certain factors can make you more likely to develop hypotension, including:  Age. Risk increases as you get older.  Conditions that affect the heart or the central nervous system.  Taking certain medicines, such as blood pressure medicine or diuretics.  Being pregnant.  What are the signs or symptoms? Symptoms of this condition may include:  Weakness.  Light-headedness.  Dizziness.  Blurred vision.  Fatigue.  Rapid heartbeat.  Fainting, in severe cases.  How is this diagnosed? This condition is diagnosed based on:  Your medical history.  Your symptoms.  Your blood pressure measurement. Your health care provider will check your blood pressure when you are: ? Lying down. ? Sitting. ? Standing.  A blood pressure reading is recorded as two numbers, such as "120 over 80" (or 120/80). The first ("top") number is called the systolic pressure. It is a  measure of the pressure in your arteries as your heart beats. The second ("bottom") number is called the diastolic pressure. It is a measure of the pressure in your arteries when your heart relaxes between beats. Blood pressure is measured in a unit called mm Hg. Healthy blood pressure for adults is 120/80. If your blood pressure is below 90/60, you may be diagnosed with hypotension. Other information or tests that may be used to diagnose hypotension include:  Your other vital signs, such as your heart rate and temperature.  Blood tests.  Tilt table test. For this test, you will be safely secured to a table that moves you from a lying position to an upright position. Your heart rhythm and blood pressure will be monitored during the test.  How is this treated? Treatment for this condition may include:  Changing your diet. This may involve eating more salt (sodium) or drinking more water.  Taking medicines to raise your blood pressure.  Changing the dosage of certain medicines you are taking that might be lowering your blood pressure.  Wearing compression stockings. These stockings help to prevent blood clots and reduce swelling in your legs.  In some cases, you may need to go to the hospital for:  Fluid replacement. This means you will receive fluids through an IV tube.  Blood replacement. This means you will receive donated blood through an IV tube (transfusion).  Treating an infection or heart problems, if this applies.  Monitoring. You may need to be monitored while medicines that you are taking wear off.  Follow these instructions at home: Eating and drinking   Drink enough fluid to keep your urine clear or pale yellow.    Eat a healthy diet and follow instructions from your health care provider about eating or drinking restrictions. A healthy diet includes: ? Fresh fruits and vegetables. ? Whole grains. ? Lean meats. ? Low-fat dairy products.  Eat extra salt only as  directed. Do not add extra salt to your diet unless your health care provider told you to do that.  Eat frequent, small meals.  Avoid standing up suddenly after eating. Medicines  Take over-the-counter and prescription medicines only as told by your health care provider. ? Follow instructions from your health care provider about changing the dosage of your current medicines, if this applies. ? Do not stop or adjust any of your medicines on your own. General instructions  Wear compression stockings as told by your health care provider.  Get up slowly from lying down or sitting positions. This gives your blood pressure a chance to adjust.  Avoid hot showers and excessive heat as directed by your health care provider.  Return to your normal activities as told by your health care provider. Ask your health care provider what activities are safe for you.  Do not use any products that contain nicotine or tobacco, such as cigarettes and e-cigarettes. If you need help quitting, ask your health care provider.  Keep all follow-up visits as told by your health care provider. This is important. Contact a health care provider if:  You vomit.  You have diarrhea.  You have a fever for more than 2-3 days.  You feel more thirsty than usual.  You feel weak and tired. Get help right away if:  You have chest pain.  You have a fast or irregular heartbeat.  You develop numbness in any part of your body.  You cannot move your arms or your legs.  You have trouble speaking.  You become sweaty or feel light-headed.  You faint.  You feel short of breath.  You have trouble staying awake.  You feel confused. This information is not intended to replace advice given to you by your health care provider. Make sure you discuss any questions you have with your health care provider. Document Released: 04/25/2005 Document Revised: 11/13/2015 Document Reviewed: 10/15/2015 Elsevier Interactive  Patient Education  2018 [employer]  

## 2017-02-10 NOTE — Evaluation (Signed)
Physical Therapy Evaluation Patient Details Name: [name] MRN: [id number] DOB: [birth year] Today's Date: 02/10/2017   History of Present Illness  Patient is a 83 y/o female who presents with dizziness and hypotension. PMH includes HTN. ESRD on HD. mild aortic stenosis, HLD, TIA, DJD, A-fib.  Clinical Impression  Patient presents with mild balance deficits which is most likely due to being in bed, but no overt LOB. Tolerated gait training with Min guard for safety. Per daughter, seems to be functioning at baseline. Sitting BP pre activity 96/51, Sitting BP post activity 120/55. Pt reports no dizziness with activity. Education re: what to do when feeling any symptoms of dizziness/lightheadedness. Has support from daughter. Pt does not require skilled therapy services. Discharge from therapy.    Follow Up Recommendations No PT follow up;Supervision - Intermittent    Equipment Recommendations  None recommended by PT    Recommendations for Other Services       Precautions / Restrictions Precautions Precautions: None Restrictions Weight Bearing Restrictions: No      Mobility  Bed Mobility Overal bed mobility: Modified Independent             General bed mobility comments: Able to get to EOB and back in bed without assist. No dizziness.   Transfers Overall transfer level: Needs assistance Equipment used: None Transfers: Sit to/from Stand Sit to Stand: Supervision         General transfer comment: Supervision to stand from EOB, no dizziness.   Ambulation/Gait Ambulation/Gait assistance: Min guard Ambulation Distance (Feet): 150 Feet Assistive device: None Gait Pattern/deviations: Step-through pattern;Decreased stride length;Drifts right/left Gait velocity: decreased   General Gait Details: Slow, mildly unsteady gait but no overt LOB. No hx of falls.   Stairs            Wheelchair Mobility    Modified Rankin (Stroke Patients Only)       Balance  Overall balance assessment: Needs assistance Sitting-balance support: Feet supported;No upper extremity supported Sitting balance-Leahy Scale: Good     Standing balance support: During functional activity Standing balance-Leahy Scale: Fair                               Pertinent Vitals/Pain Pain Assessment: No/denies pain    Home Living Family/patient expects to be discharged to:: Private residence Living Arrangements: Children (daughter) Available Help at Discharge: Family;Available PRN/intermittently Type of Home: House Home Access: Stairs to enter Entrance Stairs-Rails: Right Entrance Stairs-Number of Steps: 5 vs threshold Home Layout: One level Home Equipment: Shower seat - built in      Prior Function Level of Independence: Independent         Comments: Does her own ADLs. Helps some with cooking. Drives self to dialysis.      Hand Dominance        Extremity/Trunk Assessment   Upper Extremity Assessment Upper Extremity Assessment: Defer to OT evaluation    Lower Extremity Assessment Lower Extremity Assessment: Overall WFL for tasks assessed    Cervical / Trunk Assessment Cervical / Trunk Assessment: Kyphotic  Communication   Communication: No difficulties  Cognition Arousal/Alertness: Awake/alert Behavior During Therapy: WFL for tasks assessed/performed Overall Cognitive Status: Within Functional Limits for tasks assessed                                        General  Comments General comments (skin integrity, edema, etc.): Daughter present during session.    Exercises     Assessment/Plan    PT Assessment Patent does not need any further PT services  PT Problem List         PT Treatment Interventions      PT Goals (Current goals can be found in the Care Plan section)  Acute Rehab PT Goals Patient Stated Goal: to go home PT Goal Formulation: All assessment and education complete, DC therapy    Frequency      Barriers to discharge        Co-evaluation               AM-PAC PT "6 Clicks" Daily Activity  Outcome Measure Difficulty turning over in bed (including adjusting bedclothes, sheets and blankets)?: None Difficulty moving from lying on back to sitting on the side of the bed? : None Difficulty sitting down on and standing up from a chair with arms (e.g., wheelchair, bedside commode, etc,.)?: None Help needed moving to and from a bed to chair (including a wheelchair)?: None Help needed walking in hospital room?: None Help needed climbing 3-5 steps with a railing? : A Little 6 Click Score: 23    End of Session Equipment Utilized During Treatment: Gait belt Activity Tolerance: Patient tolerated treatment well Patient left: in bed;with call bell/phone within reach;with family/visitor present Nurse Communication: Mobility status PT Visit Diagnosis: Unsteadiness on feet (R26.81);Other abnormalities of gait and mobility (R26.89)    Time: 8727-6184 PT Time Calculation (min) (ACUTE ONLY): 15 min   Charges:   PT Evaluation $PT Eval Low Complexity: 1 Low     PT G Codes:   PT G-Codes **NOT FOR INPATIENT CLASS** Functional Assessment Tool Used: Clinical judgement Functional Limitation: Mobility: Walking and moving around Mobility: Walking and Moving Around Current Status (Q5927): At least 1 percent but less than 20 percent impaired, limited or restricted Mobility: Walking and Moving Around Goal Status [phone]): At least 1 percent but less than 20 percent impaired, limited or restricted Mobility: Walking and Moving Around Discharge Status [phone]): At least 1 percent but less than 20 percent impaired, limited or restricted    [place], [place], [place] 02/10/2017, 11:38 AM

## 2017-02-10 NOTE — Progress Notes (Signed)
Patient arrived a few minutes after 0200 alert and oriented x 4. Able to stand without assistance but weak on her feet.

## 2017-02-10 NOTE — Progress Notes (Signed)
ANTICOAGULATION CONSULT NOTE - Initial Consult  Pharmacy Consult for warfarin Indication: atrial fibrillation  Allergies  Allergen Reactions  . Tape Other (See Comments)    Pulls off skin  . Sulfa Antibiotics Other (See Comments)    Bumps all over body    Patient Measurements:    Vital Signs: Temp: 97.7 F (36.5 C) (10/04 2028) Temp Source: Oral (10/04 2028) BP: 78/45 (10/05 0030) Pulse Rate: 39 (10/05 0030)  Labs:  Recent Labs  02/09/17 2030  HGB 11.8*  HCT 37.2  PLT 123*  LABPROT 24.7*  INR 2.25  CREATININE 3.41*    CrCl cannot be calculated (Unknown ideal weight.).  Assessment: CC/HPI: 83 yo f presenting with hypotension diaphoresis and dizziness   PMH: ESRD TTS, HLD, HTN, afib on warfarin  Anticoag: warfarin pta for afib - last dose 10/3 Admit INR 2.25  PTA warfarin 3.75 mg HS  Heme/Onc: H&H 11.8/37.2, Plt 123  Goal of Therapy:  INR 2-3 Monitor platelets by anticoagulation protocol: Yes   Plan:  Warfarin 3.75 mg daily Daily INR CBC  Levester Fresh, PharmD, BCPS, BCCCP Clinical Pharmacist Clinical phone for 02/10/2017 from 7a-3:30p: [phone] If after 3:30p, please call main pharmacy at: [phone] 02/10/2017 12:55 AM     Wynell Balloon 02/10/2017,12:55 AM

## 2017-02-10 NOTE — Care Management Obs Status (Signed)
[facility] NOTIFICATION   Patient Details  Name: KONNI KESINGER MRN: [id number] Date of Birth: Jun 12, 1921   Medicare Observation Status Notification Given:  Yes    Tysheena Ginzburg, Rory Percy, RN 02/10/2017, 12:37 PM

## 2017-02-10 NOTE — Progress Notes (Signed)
Patient discharged, AVS reviewed with family and patient, prescriptions provided, tele box returned, IV removed

## 2017-02-10 NOTE — H&P (Signed)
History and Physical    [name] KGY:[id number] DOB: Dec 18, 1921 DOA: 02/09/2017  Referring MD/NP/PA:   PCP: Levin Erp, MD   Patient coming from:  The patient is coming from home.  At baseline, pt is partially dependent for most of ADL  Chief Complaint: Dizziness, hypotension  HPI: [name] is a 83 y.o. female with medical history significant of ESRD on dialysis, hypertension, hyperlipidemia, TIA, gout, atrial fibrillation on Coumadin, OSA not on CPAP, aortic sclerosis, anemia, dCHF, who presents with dizziness and hypotension.  Per patient's daughter, patient developed dizziness and diaphoresis after she had dialysis today. She was found to have hypotension with systolic pressure of 02O. Patient does not have chest pain, shortness breath. No confusion. She has mild suprapubic abdominal pain, which has resolved. No symptoms of UTI. Patient does not have fever, chills. No cough, unilateral weakness. Patient states that her body weight was 57.3 before dialysis and 56.7 kg after dialysis. Of note, pt's blood pressure has been running low recently. She was seen by cardiologist PA, Lauretta Chester on 01/23/17, who recommended her to have stopped metoprolol.  ED Course: pt was found to have blood pressure 78/43 improved to 97/48 after 1 L normal saline bolus WBC 6.7, lactic acid 2.99, 2.27, INR 2.25, negative troponin, pending urinalysis, potassium 3.9, bicarbonate 29, creatinine 3.41, BUN 12, temperature normal, no tachycardia, no tachypnea, O2 sat 97% on room air, chest x-ray negative. CT abdomen/pelvis showed multiple liver lesions which are likely liver cyst otherwise not impressive. Patient is placed on telemetry bed for observation..   Review of Systems:   General: no fevers, chills, no body weight gain, has fatigue HEENT: no blurry vision, hearing changes or sore throat Respiratory: no dyspnea, coughing, wheezing CV: no chest pain, no palpitations GI: no nausea, vomiting,  abdominal pain, diarrhea, constipation GU: no dysuria, burning on urination, increased urinary frequency, hematuria  Ext: no leg edema Neuro: no unilateral weakness, numbness, or tingling, no vision change or hearing loss. Has dizziness Skin: no rash, no skin tear. MSK: No muscle spasm, no deformity, no limitation of range of movement in spin Heme: No easy bruising.  Travel history: No recent long distant travel.  Allergy:  Allergies  Allergen Reactions  . Tape Other (See Comments)    Pulls off skin  . Sulfa Antibiotics Other (See Comments)    Bumps all over body    Past Medical History:  Diagnosis Date  . Anemia   . Aortic stenosis   . Benign bladder mass   . Chronic cough   . DJD (degenerative joint disease)   . Dyslipidemia   . ESRD on hemodialysis ([facility])    "TTS; [facility]" (09/27/2016)  . Gout   . HTN (hypertension)   . Obstructive sleep apnea    mild  . Orthostatic syncope    around year 2006  . Pneumonia   . TIA (transient ischemic attack)     Past Surgical History:  Procedure Laterality Date  . BONE MARROW BIOPSY    . CATARACT EXTRACTION W/ INTRAOCULAR LENS  IMPLANT, BILATERAL    . COLONOSCOPY W/ BIOPSIES AND POLYPECTOMY    . left arm dialysis graft    . NEPHRECTOMY     left  . REVISION OF ARTERIOVENOUS GORETEX GRAFT Right 11/24/2014   Procedure: REPLACEMENT OF  MEDIAL SIDE OF RIGHT FORARM ARTERIOVENOUS GORETEX GRAFT;  Surgeon: Conrad Experiment, MD;  Location: [facility];  Service: Vascular;  Laterality: Right;  . REVISION OF ARTERIOVENOUS GORETEX GRAFT  Right 11/28/2015   Procedure: REVISION OF RIGHT ARM ARTERIOVENOUS GORETEX GRAFT;  Surgeon: Serafina Mitchell, MD;  Location: [facility];  Service: Vascular;  Laterality: Right;  . TOTAL ABDOMINAL HYSTERECTOMY      Social History:  reports that she has never smoked. She has never used smokeless tobacco. She reports that she does not drink alcohol or use drugs.  Family History:  Family History  Problem Relation Age of  Onset  . Hypertension Mother   . Diabetes Mother   . Hypertension Other      Prior to Admission medications   Medication Sig Start Date End Date Taking? Authorizing Provider  acetaminophen (TYLENOL) 500 MG tablet Take 500 mg by mouth every 6 (six) hours as needed.   Yes [provider]  amiodarone (PACERONE) 200 MG tablet Take 0.5 tablets (100 mg total) by mouth daily. 12/14/16  Yes Barrett, Evelene Croon, PA-C  metoprolol succinate (TOPROL-XL) 25 MG 24 hr tablet Take 12.5 mg by mouth daily as needed. irregular or high heart rate 11/29/16  Yes [provider]  multivitamin (RENA-VIT) TABS tablet Take 1 tablet by mouth daily.   Yes [provider]  SENSIPAR 30 MG tablet Take 30 mg by mouth daily.  02/14/13  Yes [provider]  warfarin (COUMADIN) 2.5 MG tablet Take 1 to 1.5 tablets by mouth daily as directed by coumadin clinic Patient taking differently: Take 3.75 mg by mouth at bedtime.  11/11/16  Yes Minus Breeding, MD    Physical Exam: Vitals:   02/09/17 2350 02/10/17 0010 02/10/17 0020 02/10/17 0030  BP: (!) 84/53 (!) 86/63 (!) 88/44 (!) 78/45  Pulse: 90 73 (!) 52 (!) 39  Resp: 15 17 14 16   Temp:      TempSrc:      SpO2: 96% 97% 96% 97%   General: Not in acute distress HEENT:       Eyes: PERRL, EOMI, no scleral icterus.       ENT: No discharge from the ears and nose, no pharynx injection, no tonsillar enlargement.        Neck: No JVD, no bruit, no mass felt. Heme: No neck lymph node enlargement. Cardiac: S1/S2, RRR, No murmurs, No gallops or rubs. Respiratory: Good air movement bilaterally. No rales, wheezing, rhonchi or rubs. GI: Soft, nondistended, nontender, no rebound pain, no organomegaly, BS present. GU: No hematuria Ext: No pitting leg edema bilaterally. 2+DP/PT pulse bilaterally. Musculoskeletal: No joint deformities, No joint redness or warmth, no limitation of ROM in spin. Skin: No rashes.  Neuro: Alert, oriented X3, cranial nerves  II-XII grossly intact, moves all extremities normally. Muscle strength 5/5 in all extremities, sensation to light touch intact. Brachial reflex 2+ bilaterally. Knee reflex 1+ bilaterally. Negative Babinski's sign. Normal finger to nose test. Psych: Patient is not psychotic, no suicidal or hemocidal ideation.  Labs on Admission: I have personally reviewed following labs and imaging studies  CBC:  Recent Labs Lab 02/09/17 2030  WBC 6.7  NEUTROABS 4.2  HGB 11.8*  HCT 37.2  MCV 100.5*  PLT 284*   Basic Metabolic Panel:  Recent Labs Lab 02/09/17 2030  NA 138  K 3.9  CL 94*  CO2 29  GLUCOSE 81  BUN 12  CREATININE 3.41*  CALCIUM 9.9   GFR: CrCl cannot be calculated (Unknown ideal weight.). Liver Function Tests:  Recent Labs Lab 02/09/17 2030  AST 26  ALT 15  ALKPHOS 58  BILITOT 0.9  PROT 6.8  ALBUMIN 3.8  No results for input(s): LIPASE, AMYLASE in the last 168 hours. No results for input(s): AMMONIA in the last 168 hours. Coagulation Profile:  Recent Labs Lab 02/07/17 02/09/17 2030  INR 2.6* 2.25   Cardiac Enzymes: No results for input(s): CKTOTAL, CKMB, CKMBINDEX, TROPONINI in the last 168 hours. BNP (last 3 results) No results for input(s): PROBNP in the last 8760 hours. HbA1C: No results for input(s): HGBA1C in the last 72 hours. CBG: No results for input(s): GLUCAP in the last 168 hours. Lipid Profile: No results for input(s): CHOL, HDL, LDLCALC, TRIG, CHOLHDL, LDLDIRECT in the last 72 hours. Thyroid Function Tests: No results for input(s): TSH, T4TOTAL, FREET4, T3FREE, THYROIDAB in the last 72 hours. Anemia Panel: No results for input(s): VITAMINB12, FOLATE, FERRITIN, TIBC, IRON, RETICCTPCT in the last 72 hours. Urine analysis: No results found for: COLORURINE, APPEARANCEUR, LABSPEC, PHURINE, GLUCOSEU, HGBUR, BILIRUBINUR, KETONESUR, PROTEINUR, UROBILINOGEN, NITRITE, LEUKOCYTESUR Sepsis Labs: @LABRCNTIP (procalcitonin:4,lacticidven:4) )No results  found for this or any previous visit (from the past 240 hour(s)).   Radiological Exams on Admission: Ct Abdomen Pelvis Wo Contrast  Result Date: 02/09/2017 CLINICAL DATA:  Lower abdominal pain starting today. No nausea, vomiting, or diarrhea. EXAM: CT ABDOMEN AND PELVIS WITHOUT CONTRAST TECHNIQUE: Multidetector CT imaging of the abdomen and pelvis was performed following the standard protocol without IV contrast. COMPARISON:  03/26/2012 FINDINGS: Lower chest: Dependent atelectasis versus scarring in the lung bases. Diffuse cardiac enlargement. Calcification in the aortic and mitral valve annulus. Hepatobiliary: Multiple circumscribed low-attenuation lesions in the liver. Largest is in segment 8 and measures about 2.5 cm diameter. These lesions are not significantly changed since previous study. Gallbladder and bile ducts are unremarkable. Pancreas: Unremarkable. No pancreatic ductal dilatation or surrounding inflammatory changes. Spleen: Normal in size without focal abnormality. Adrenals/Urinary Tract: The left kidney is absent, either congenital or surgical. The right kidney is severely atrophic with small cysts present. No hydronephrosis. No adrenal gland nodules. Bladder is decompressed, limiting evaluation. Stomach/Bowel: Stomach, small bowel, and colon are not abnormally distended. Contrast material flows through to the cecum without evidence of bowel obstruction. Appendix is normal. Diverticulosis of the sigmoid colon. No evidence of diverticulitis. Vascular/Lymphatic: Extensive calcification of the aorta, iliac artery's, and celiac axis artery's. No aneurysm. Reproductive: Uterus and bilateral adnexa are unremarkable. Other: No free air or free fluid in the abdomen. Abdominal wall musculature appears intact. Musculoskeletal: Degenerative changes throughout the spine. Increase mineralization around the lumbar endplates likely represents discogenic degenerative change. Degenerative changes in the hips.  Diffuse bone demineralization. IMPRESSION: 1. Multiple low-attenuation lesions in the liver, likely cysts, are unchanged. 2. Severe atrophy of the right kidney.  Absent left kidney. 3. Aortic atherosclerosis with extensive vascular calcifications throughout the abdomen. 4. Diverticulosis of the sigmoid colon. No evidence of diverticulitis. No evidence of bowel obstruction. Electronically Signed   By: Lucienne Capers M.D.   On: 02/09/2017 23:46   Dg Chest Port 1 View  Result Date: 02/09/2017 CLINICAL DATA:  Patient here from home with history of dialysis for acute hypotension. Patient was at home and became diaphoretic and dizzy. Patient is AANDOx4. BP in the 70s palpated for EMS. EXAM: PORTABLE CHEST 1 VIEW COMPARISON:  11/08/2016 FINDINGS: The heart is enlarged. There are no focal consolidations or pleural effusions. There is mild perihilar peribronchial thickening, stable. No pulmonary edema. Shallow lung inflation and stable elevation of right hemidiaphragm. IMPRESSION: Stable cardiomegaly. Shallow lung inflation Electronically Signed   By: Nolon Nations M.D.   On: 02/09/2017 21:09  EKG: Not done in ED, will get one.   Assessment/Plan Principal Problem:   Hypotension Active Problems:   ESRD on dialysis (HCC)   PAF (paroxysmal atrial fibrillation) (HCC)   Essential hypertension   Lactic acidosis   Hypotension: Etiology is not clear. Likely due to fluid removal from dialysis in the setting of chronic soft blood pressure. Blood pressure responded to IV fluid, improved from 78/43 to 97/49 after 1 L normal saline bolus. Patient does not have leukocytosis or fever, less likely to have infection or sepsis. Her mental status is normal.  -will place on tele bed for obs -IVF: 1L NS in ED, then 50 cc/h -start lowe dose of midodrine, 2.5 mg tid -Give one dose of Solu Cortef, 50 mg 1 -Follow-up cortisol level -monitoring Bp and mental status closely -Follow-up blood culture   ESRD (end  stage renal disease) on dialysis (TTS):  -Continue Phoslo, Rena-vit -I expect pt will be d/c'ed tomorrow. I did not call renal for HD. If pt is still in hospital after 24 hours, please call renal for HD.  Atrial Fibrillation: CHA2DS2-VASc Score is 7, needs oral anticoagulation. Patient is on Coumadin at home. INR is 2.25 on admission. Heart rate is well controlled. -continue amiodarone -Hold metoprolol  Essential hypertension: Not has hypotension. -Hold metoprolol  Lactic acidosis: Likely acid is 2.99-->2. 65. Patient does not have signs of infection. Most likely due to hypotension and hypoperfusion -IVF as above -trend lactic acid  Liver lesions: CT abdomen/pelvis showed multiple liver lesions which are likely liver cyst. -f/u with PCP   DVT ppx: on Coumadin Code Status: Full code Family Communication: Yes, patient's son and daughter at bed side Disposition Plan:  Anticipate discharge back to previous home environment Consults called:  none Admission status: Obs / tele         Date of Service 02/10/2017    Ivor Costa [facility] Pager [phone]  If 7PM-7AM, please contact night-coverage [email or web address] Password [email or web address] 02/10/2017, 1:06 AM

## 2017-02-10 NOTE — Progress Notes (Signed)
Initial Nutrition Assessment  DOCUMENTATION CODES:   Non-severe (moderate) malnutrition in context of chronic illness  INTERVENTION:  Continue Nepro Shake po BID, each supplement provides 425 kcal and 19 grams protein  NUTRITION DIAGNOSIS:   Malnutrition (non-severe) related to chronic illness (ESRD) as evidenced by moderate depletions of muscle mass, energy intake < 75% for > or equal to 1 month, mild depletion of muscle mass.  GOAL:   Patient will meet greater than or equal to 90% of their needs  MONITOR:   PO intake, Supplement acceptance, Weight trends, I & O's  REASON FOR ASSESSMENT:   Malnutrition Screening Tool    ASSESSMENT:   Pt with PMH of ESRD on HD TTS, HTN, gout, DJD, mild OSA, chronic cough presented with dizziness, hypotension, abdominal pain and diaphoresis s/p HD    Pt reports PTA on a typical day she would consume 1/2 cup oatmeal and occasionally an applesauce at breakfast, small baked potato or whatever she could find late afternoon and a cookie or a 1/2 spoonful of peanut butter before bed. Per chart pt only consumed 25% of breakfast this morning. Pt reports consuming an occasional Nepro supplement at baseline and will try to consume at least one per day while admitted.   Pt unsure of weight changes given fluid fluctuations, however states her EDW is 56 kg. Last HD treatment 02/09/17.   Nutrition focused physical exam completed. Findings include no fat depletion, mild-moderate muscle depletion and unable to assess edema.   Labs reviewed;  Medications reviewed; Sensipar, Renavit, Warfarin, PRN Zofran  Diet Order:  Diet renal with fluid restriction Fluid restriction: 1200 mL Fluid; Room service appropriate? Yes; Fluid consistency: Thin  Skin:  Reviewed, no issues  Last BM:  02/09/17  Height:   Ht Readings from Last 1 Encounters:  02/10/17 5\' 3"  (1.6 m)    Weight:   Wt Readings from Last 1 Encounters:  02/10/17 120 lb 4.8 oz (54.6 kg)    Ideal  Body Weight:  52 kg  BMI:  Body mass index is 21.31 kg/m.  Estimated Nutritional Needs:   Kcal:  9030-0923  Protein:  75-85 grams  Fluid:  </= 1.2 L/d  EDUCATION NEEDS:   Education needs no appropriate at this time  Parks Ranger, MS, RDN, LDN 02/10/2017 1:12 PM

## 2017-02-10 NOTE — Consult Note (Signed)
[facility] Renal Consultation Note    Indication for Consultation:  Management of ESRD/hemodialysis; anemia, hypertension/volume and secondary hyperparathyroidism PCP: Dr. Grayland Ormond Cardiologist: Dr. Percival Spanish Nephrologist: Dr. Macarthur Critchley  HPI: [name] is a 83 y.o. AA female with ESRD on hemodialysis T,Th,S at [facility]. 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 PMH significant for HTN, OSA, aortic stenosis, Afib on coumadin, TIA.   Patient presented to ED yesterday after developing C/O hypotension, dizziness, lower abdominal pain and diaphoresis post hemodialysis. SBP was reported in 70s. Daughter called EMS and patient was brought for evaluation. BP on arrival to ED 78/43 Patient received one liter of NS in ED with improvement of BP to 97/48. Labs unremarkable, CXR unremarkable. CT of abd/pelvis unremarkable. EKG shows SR with RBBB. She has been admitted as observation patient for hypotension.   Today, patient is awake, alert oriented X 3. She says she feels some better today. Wants to go home. Has had issues taking metoprolol in past-Says she is no longer taking betablocker-was discontinued last cardiology visit 01/23/2017. She says abdominal pain resolved. BP lying 89/40 Standing 104/66. She has been started on midodrine per primary. She denies fever, chills, N,V,D, chest pain, SOB at present. Denies syncope, says her daughter said her "eyes were glazed over" but doesn't recall losing consciousness. No hearing or vision changes.   She attends dialysis on regular basis-still drives herself to treatments. Never has high IDWG, is compliant with hemodialysis prescription.     Past Medical History:  Diagnosis Date  . Anemia   . Aortic stenosis   . Benign bladder mass   . Chronic cough   . DJD (degenerative joint disease)   . Dyslipidemia   . ESRD on hemodialysis ([place])    "TTS; [place]" (09/27/2016)  . Gout   . HTN  (hypertension)   . Obstructive sleep apnea    mild  . Orthostatic syncope    around year 2006  . Pneumonia   . TIA (transient ischemic attack)    Past Surgical History:  Procedure Laterality Date  . BONE MARROW BIOPSY    . CATARACT EXTRACTION W/ INTRAOCULAR LENS  IMPLANT, BILATERAL    . COLONOSCOPY W/ BIOPSIES AND POLYPECTOMY    . left arm dialysis graft    . NEPHRECTOMY     left  . REVISION OF ARTERIOVENOUS GORETEX GRAFT Right 11/24/2014   Procedure: REPLACEMENT OF  MEDIAL SIDE OF RIGHT FORARM ARTERIOVENOUS GORETEX GRAFT;  Surgeon: Conrad Bridgman, MD;  Location: [place];  Service: Vascular;  Laterality: Right;  . REVISION OF ARTERIOVENOUS GORETEX GRAFT Right 11/28/2015   Procedure: REVISION OF RIGHT ARM ARTERIOVENOUS GORETEX GRAFT;  Surgeon: Serafina Mitchell, MD;  Location: [facility];  Service: Vascular;  Laterality: Right;  . TOTAL ABDOMINAL HYSTERECTOMY     Family History  Problem Relation Age of Onset  . Hypertension Mother   . Diabetes Mother   . Hypertension Other    Social History:  reports that she has never smoked. She has never used smokeless tobacco. She reports that she does not drink alcohol or use drugs. Allergies  Allergen Reactions  . Tape Other (See Comments)    Pulls off skin  . Sulfa Antibiotics Other (See Comments)    Bumps all over body   Prior to Admission medications   Medication Sig Start Date End Date Taking? Authorizing Provider  acetaminophen (TYLENOL) 500 MG tablet Take 500 mg by mouth every 6 (six) hours  as needed.   Yes [provider]  amiodarone (PACERONE) 200 MG tablet Take 0.5 tablets (100 mg total) by mouth daily. 12/14/16  Yes Barrett, Evelene Croon, PA-C  metoprolol succinate (TOPROL-XL) 25 MG 24 hr tablet Take 12.5 mg by mouth daily as needed. irregular or high heart rate 11/29/16  Yes [provider]  multivitamin (RENA-VIT) TABS tablet Take 1 tablet by mouth daily.   Yes [provider]  SENSIPAR 30 MG tablet Take 30 mg by  mouth daily.  02/14/13  Yes [provider]  warfarin (COUMADIN) 2.5 MG tablet Take 1 to 1.5 tablets by mouth daily as directed by coumadin clinic Patient taking differently: Take 3.75 mg by mouth at bedtime.  11/11/16  Yes Minus Breeding, MD   Current Facility-Administered Medications  Medication Dose Route Frequency Provider Last Rate Last Dose  . acetaminophen (TYLENOL) tablet 500 mg  500 mg Oral Q6H PRN Ivor Costa, MD      . amiodarone (PACERONE) tablet 100 mg  100 mg Oral Daily Ivor Costa, MD   100 mg at 02/10/17 1145  . cinacalcet (SENSIPAR) tablet 30 mg  30 mg Oral Q breakfast Ivor Costa, MD   30 mg at 02/10/17 4287  . feeding supplement (NEPRO CARB STEADY) liquid 237 mL  237 mL Oral BID BM Ivor Costa, MD   237 mL at 02/10/17 1145  . midodrine (PROAMATINE) tablet 2.5 mg  2.5 mg Oral TID WC Ivor Costa, MD   2.5 mg at 02/10/17 1145  . multivitamin (RENA-VIT) tablet 1 tablet  1 tablet Oral Daily Ivor Costa, MD   1 tablet at 02/10/17 1145  . ondansetron (ZOFRAN) tablet 4 mg  4 mg Oral Q6H PRN Ivor Costa, MD       Or  . ondansetron [facility]) injection 4 mg  4 mg Intravenous Q6H PRN Ivor Costa, MD      . Warfarin - Pharmacist Dosing Inpatient   Does not apply q1800 Wynell Balloon, RPH      . zolpidem (AMBIEN) tablet 5 mg  5 mg Oral QHS PRN Ivor Costa, MD       Labs: Basic Metabolic Panel:  Recent Labs Lab 02/09/17 2030  NA 138  K 3.9  CL 94*  CO2 29  GLUCOSE 81  BUN 12  CREATININE 3.41*  CALCIUM 9.9   Liver Function Tests:  Recent Labs Lab 02/09/17 2030  AST 26  ALT 15  ALKPHOS 58  BILITOT 0.9  PROT 6.8  ALBUMIN 3.8   No results for input(s): LIPASE, AMYLASE in the last 168 hours. No results for input(s): AMMONIA in the last 168 hours. CBC:  Recent Labs Lab 02/09/17 2030  WBC 6.7  NEUTROABS 4.2  HGB 11.8*  HCT 37.2  MCV 100.5*  PLT 123*   Cardiac Enzymes: No results for input(s): CKTOTAL, CKMB, CKMBINDEX, TROPONINI in the last 168 hours. CBG: No  results for input(s): GLUCAP in the last 168 hours. Iron Studies: No results for input(s): IRON, TIBC, TRANSFERRIN, FERRITIN in the last 72 hours. Studies/Results: Ct Abdomen Pelvis Wo Contrast  Result Date: 02/09/2017 CLINICAL DATA:  Lower abdominal pain starting today. No nausea, vomiting, or diarrhea. EXAM: CT ABDOMEN AND PELVIS WITHOUT CONTRAST TECHNIQUE: Multidetector CT imaging of the abdomen and pelvis was performed following the standard protocol without IV contrast. COMPARISON:  03/26/2012 FINDINGS: Lower chest: Dependent atelectasis versus scarring in the lung bases. Diffuse cardiac enlargement. Calcification in the aortic and mitral valve annulus. Hepatobiliary: Multiple circumscribed low-attenuation lesions  in the liver. Largest is in segment 8 and measures about 2.5 cm diameter. These lesions are not significantly changed since previous study. Gallbladder and bile ducts are unremarkable. Pancreas: Unremarkable. No pancreatic ductal dilatation or surrounding inflammatory changes. Spleen: Normal in size without focal abnormality. Adrenals/Urinary Tract: The left kidney is absent, either congenital or surgical. The right kidney is severely atrophic with small cysts present. No hydronephrosis. No adrenal gland nodules. Bladder is decompressed, limiting evaluation. Stomach/Bowel: Stomach, small bowel, and colon are not abnormally distended. Contrast material flows through to the cecum without evidence of bowel obstruction. Appendix is normal. Diverticulosis of the sigmoid colon. No evidence of diverticulitis. Vascular/Lymphatic: Extensive calcification of the aorta, iliac artery's, and celiac axis artery's. No aneurysm. Reproductive: Uterus and bilateral adnexa are unremarkable. Other: No free air or free fluid in the abdomen. Abdominal wall musculature appears intact. Musculoskeletal: Degenerative changes throughout the spine. Increase mineralization around the lumbar endplates likely represents  discogenic degenerative change. Degenerative changes in the hips. Diffuse bone demineralization. IMPRESSION: 1. Multiple low-attenuation lesions in the liver, likely cysts, are unchanged. 2. Severe atrophy of the right kidney.  Absent left kidney. 3. Aortic atherosclerosis with extensive vascular calcifications throughout the abdomen. 4. Diverticulosis of the sigmoid colon. No evidence of diverticulitis. No evidence of bowel obstruction. Electronically Signed   By: Lucienne Capers M.D.   On: 02/09/2017 23:46   Dg Chest Port 1 View  Result Date: 02/09/2017 CLINICAL DATA:  Patient here from home with history of dialysis for acute hypotension. Patient was at home and became diaphoretic and dizzy. Patient is AANDOx4. BP in the 70s palpated for EMS. EXAM: PORTABLE CHEST 1 VIEW COMPARISON:  11/08/2016 FINDINGS: The heart is enlarged. There are no focal consolidations or pleural effusions. There is mild perihilar peribronchial thickening, stable. No pulmonary edema. Shallow lung inflation and stable elevation of right hemidiaphragm. IMPRESSION: Stable cardiomegaly. Shallow lung inflation Electronically Signed   By: Nolon Nations M.D.   On: 02/09/2017 21:09    ROS: As per HPI otherwise negative.    Physical Exam: Vitals:   02/10/17 0110 02/10/17 0214 02/10/17 0436 02/10/17 0750  BP: (!) 89/42 (!) 95/41 (!) 85/41 (!) 82/34  Pulse: 72 68 62 62  Resp: 15 16 18 18   Temp:  (!) 97.5 F (36.4 C) 97.9 F (36.6 C) 97.6 F (36.4 C)  TempSrc:  Oral Oral Oral  SpO2: 97% 98% 98% 98%  Weight:  54.6 kg (120 lb 4.8 oz)    Height:  5' 3"  (1.6 m)       General: Frail appearing elderly female in NAD Head: Normocephalic, atraumatic, sclera non-icteric, mucus membranes are moist Neck: Supple. JVD not elevated. Lungs: Clear bilaterally to auscultation without wheezes, rales, or rhonchi. Breathing is unlabored. Heart: RRR with S1 S2. 2/6 Systolic M. No R/G SR on monitor Abdomen: Soft, non-tender, non-distended  with normoactive bowel sounds. No rebound/guarding. No obvious abdominal masses. M-S:  Strength and tone appear normal for age. Lower extremities:without edema or ischemic changes, no open wounds  Neuro: Alert and oriented X 3. Moves all extremities spontaneously. Psych:  Responds to questions appropriately with a normal affect. Dialysis Access: RFA AVG + bruit  Dialysis Orders: [place] T,Th,S 4 hrs 160 NRe 350/Auto 1.5 56 kg 2.0/2.25 Ca UF profile 2 Linear Na Heparin 4000 units IV TIW Hectoral 3 mcg IV TIW (last PTH 310 12/29/16)  Assessment/Plan: 1.  Hypotension: BB have been discontinued by Cards. Will start midodrine but increase dose to 10 mg  PO BID. Standing wt today 58.5 kg which is 2.5 kg over OP EDW. No evidence of volume overload by exam or CXR. Will raise EDW 57.5 kg.  2.  ESRD -  T,Th,S. No acute HD needs today. Will have HD in-center if [place] home or here if still in hospital. K+ 3.9 2.0 K bath.  3.  Hypertension/volume  - As noted above.  4.  Anemia  - HGB stable-no ESA needed.  5.  Metabolic bone disease -  No binders, continue VDRA. Ca 9.9  6.  Nutrition -Renal diet with fld restrictions. Nepro/Renal vit 7. H/O PAF on coumadin: Continue coumadin per pharmacy INR 2.17  Rita H. Owens Shark, NP-C 02/10/2017, 12:15 PM  [employer] [phone]  Pt seen, examined and agree w A/P as above. ESRD pt with hypotension episode after HD.  May be gaining body wt, will raise dry wt 1-2kg and have started midodrine.  Will follow.  HD tomorrow if still here.  Kelly Splinter MD [employer] pager [phone]   02/10/2017, 2:59 PM

## 2017-02-10 NOTE — ED Notes (Signed)
Admitting at bedside.  BP remains hypotensive, patient remains A&Ox4.  MD aware of BP.

## 2017-02-10 NOTE — Discharge Summary (Signed)
Physician Discharge Summary  [name] WUJ:[id number] DOB: 07-21-1921 DOA: 02/09/2017  PCP: Levin Erp, MD  Admit date: 02/09/2017 Discharge date: 02/10/2017  Admitted From: Home Disposition: Home  Recommendations for Outpatient Follow-up:  1. Follow up with PCP in 1 week 2. Midodrine started; monitor blood pressure 3. Please follow up on the following pending results: Blood cultures  Home Health: None Equipment/Devices: None  Discharge Condition: Stable CODE STATUS: Full code Diet recommendation: Renal/heart healthy   Brief/Interim Summary:  Admission HPI written by Ivor Costa, MD   Chief Complaint: Dizziness, hypotension  HPI: [name] is a 83 y.o. female with medical history significant of ESRD on dialysis, hypertension, hyperlipidemia, TIA, gout, atrial fibrillation on Coumadin, OSA not on CPAP, aortic sclerosis, anemia, dCHF, who presents with dizziness and hypotension.  Per patient's daughter, patient developed dizziness and diaphoresis after she had dialysis today. She was found to have hypotension with systolic pressure of 95A. Patient does not have chest pain, shortness breath. No confusion. She has mild suprapubic abdominal pain, which has resolved. No symptoms of UTI. Patient does not have fever, chills. No cough, unilateral weakness. Patient states that her body weight was 57.3 before dialysis and 56.7 kg after dialysis. Of note, pt's blood pressure has been running low recently. She was seen by cardiologist PA, Lauretta Chester on 01/23/17, who recommended her to have stopped metoprolol.  ED Course: pt was found to have blood pressure 78/43 improved to 97/48 after 1 L normal saline bolus WBC 6.7, lactic acid 2.99, 2.27, INR 2.25, negative troponin, pending urinalysis, potassium 3.9, bicarbonate 29, creatinine 3.41, BUN 12, temperature normal, no tachycardia, no tachypnea, O2 sat 97% on room air, chest x-ray negative. CT abdomen/pelvis showed multiple liver  lesions which are likely liver cyst otherwise not impressive. Patient is placed on telemetry bed for observation.   Hospital course:  Hypotension Appears secondary hypovolemia from dialysis. Received IV fluids which improved symptoms of dizziness. Blood pressure improved to 90s/40s with blood pressure of 120/55 while sitting, post activity, with physical therapy. She was given one dose of Solu cortef on admission. Orthostatic vitals obtained prior to discharge significant for no evidence of orthostatic hypotension. Blood pressures improved with standing. Blood cultures obtained and are pending at discharge.  Atrial fibrillation Continued Coumadin and amiodarone in the hospital. Resumed metoprolol (prescribed as prn) on discharge.  Essential hypertension Currently hypotensive. Not on medication except prn metoprolol for palpitations.  Lactic acidosis Secondary to hypotension/hypovolemia. Improved from 2.99 to 1.5.  Liver lesions Likely cysts. Outpatient follow-up.   Discharge Diagnoses:  Principal Problem:   Hypotension Active Problems:   ESRD on dialysis ([place])   PAF (paroxysmal atrial fibrillation) ([facility])   Essential hypertension   Lactic acidosis    Discharge Instructions  Discharge Instructions    Call MD for:  extreme fatigue    Complete by:  As directed    Call MD for:  persistant dizziness or light-headedness    Complete by:  As directed    Call MD for:  persistant nausea and vomiting    Complete by:  As directed    Diet - low sodium heart healthy    Complete by:  As directed    Increase activity slowly    Complete by:  As directed      Allergies as of 02/10/2017      Reactions   Tape Other (See Comments)   Pulls off skin   Sulfa Antibiotics Other (See Comments)   Bumps all over  body      Medication List    TAKE these medications   acetaminophen 500 MG tablet Commonly known as:  TYLENOL Take 500 mg by mouth every 6 (six) hours as needed.   amiodarone  200 MG tablet Commonly known as:  PACERONE Take 0.5 tablets (100 mg total) by mouth daily.   feeding supplement (NEPRO CARB STEADY) Liqd Take 237 mLs by mouth 2 (two) times daily between meals.   metoprolol succinate 25 MG 24 hr tablet Commonly known as:  TOPROL-XL Take 12.5 mg by mouth daily as needed. irregular or high heart rate   midodrine 10 MG tablet Commonly known as:  PROAMATINE Take 1 tablet (10 mg total) by mouth 2 (two) times daily with a meal.   multivitamin Tabs tablet Take 1 tablet by mouth daily.   SENSIPAR 30 MG tablet Generic drug:  cinacalcet Take 30 mg by mouth daily.   warfarin 2.5 MG tablet Commonly known as:  COUMADIN Take 1 to 1.5 tablets by mouth daily as directed by coumadin clinic What changed:  how much to take  how to take this  when to take this  additional instructions      Follow-up Information    Levin Erp, MD. Schedule an appointment as soon as possible for a visit in 1 week(s).   Specialty:  Internal Medicine Contact information: [address], [address] [place] [address] [phone]          Allergies  Allergen Reactions  . Tape Other (See Comments)    Pulls off skin  . Sulfa Antibiotics Other (See Comments)    Bumps all over body    Consultations:  None   Procedures/Studies: Ct Abdomen Pelvis Wo Contrast  Result Date: 02/09/2017 CLINICAL DATA:  Lower abdominal pain starting today. No nausea, vomiting, or diarrhea. EXAM: CT ABDOMEN AND PELVIS WITHOUT CONTRAST TECHNIQUE: Multidetector CT imaging of the abdomen and pelvis was performed following the standard protocol without IV contrast. COMPARISON:  03/26/2012 FINDINGS: Lower chest: Dependent atelectasis versus scarring in the lung bases. Diffuse cardiac enlargement. Calcification in the aortic and mitral valve annulus. Hepatobiliary: Multiple circumscribed low-attenuation lesions in the liver. Largest is in segment 8 and measures about 2.5 cm diameter.  These lesions are not significantly changed since previous study. Gallbladder and bile ducts are unremarkable. Pancreas: Unremarkable. No pancreatic ductal dilatation or surrounding inflammatory changes. Spleen: Normal in size without focal abnormality. Adrenals/Urinary Tract: The left kidney is absent, either congenital or surgical. The right kidney is severely atrophic with small cysts present. No hydronephrosis. No adrenal gland nodules. Bladder is decompressed, limiting evaluation. Stomach/Bowel: Stomach, small bowel, and colon are not abnormally distended. Contrast material flows through to the cecum without evidence of bowel obstruction. Appendix is normal. Diverticulosis of the sigmoid colon. No evidence of diverticulitis. Vascular/Lymphatic: Extensive calcification of the aorta, iliac artery's, and celiac axis artery's. No aneurysm. Reproductive: Uterus and bilateral adnexa are unremarkable. Other: No free air or free fluid in the abdomen. Abdominal wall musculature appears intact. Musculoskeletal: Degenerative changes throughout the spine. Increase mineralization around the lumbar endplates likely represents discogenic degenerative change. Degenerative changes in the hips. Diffuse bone demineralization. IMPRESSION: 1. Multiple low-attenuation lesions in the liver, likely cysts, are unchanged. 2. Severe atrophy of the right kidney.  Absent left kidney. 3. Aortic atherosclerosis with extensive vascular calcifications throughout the abdomen. 4. Diverticulosis of the sigmoid colon. No evidence of diverticulitis. No evidence of bowel obstruction. Electronically Signed   By: Oren Beckmann.D.  On: 02/09/2017 23:46   Dg Chest Port 1 View  Result Date: 02/09/2017 CLINICAL DATA:  Patient here from home with history of dialysis for acute hypotension. Patient was at home and became diaphoretic and dizzy. Patient is AANDOx4. BP in the 70s palpated for EMS. EXAM: PORTABLE CHEST 1 VIEW COMPARISON:  11/08/2016  FINDINGS: The heart is enlarged. There are no focal consolidations or pleural effusions. There is mild perihilar peribronchial thickening, stable. No pulmonary edema. Shallow lung inflation and stable elevation of right hemidiaphragm. IMPRESSION: Stable cardiomegaly. Shallow lung inflation Electronically Signed   By: Nolon Nations M.D.   On: 02/09/2017 21:09     Subjective: No dizziness. No fatigue. Wants to be discharged.  Discharge Exam: Vitals:   02/10/17 0436 02/10/17 0750  BP: (!) 85/41 (!) 82/34  Pulse: 62 62  Resp: 18 18  Temp: 97.9 F (36.6 C) 97.6 F (36.4 C)  SpO2: 98% 98%   Vitals:   02/10/17 0110 02/10/17 0214 02/10/17 0436 02/10/17 0750  BP: (!) 89/42 (!) 95/41 (!) 85/41 (!) 82/34  Pulse: 72 68 62 62  Resp: 15 16 18 18   Temp:  (!) 97.5 F (36.4 C) 97.9 F (36.6 C) 97.6 F (36.4 C)  TempSrc:  Oral Oral Oral  SpO2: 97% 98% 98% 98%  Weight:  54.6 kg (120 lb 4.8 oz)    Height:  5\' 3"  (1.6 m)      General: Pt is alert, awake, not in acute distress Cardiovascular: RRR, S1/S2 +, no rubs, no gallops. Fistula with bruit and faint thrill Respiratory: CTA bilaterally, no wheezing, no rhonchi Abdominal: Soft, NT, ND, bowel sounds + Extremities: no edema, no cyanosis    The results of significant diagnostics from this hospitalization (including imaging, microbiology, ancillary and laboratory) are listed below for reference.     Microbiology: Recent Results (from the past 240 hour(s))  MRSA PCR Screening     Status: None   Collection Time: 02/10/17  3:26 AM  Result Value Ref Range Status   MRSA by PCR NEGATIVE NEGATIVE Final    Comment:        The GeneXpert MRSA Assay (FDA approved for NASAL specimens only), is one component of a comprehensive MRSA colonization surveillance program. It is not intended to diagnose MRSA infection nor to guide or monitor treatment for MRSA infections.      Labs: BNP (last 3 results)  Recent Labs  09/27/16 1125  BNP  5,852.7*   Basic Metabolic Panel:  Recent Labs Lab 02/09/17 2030  NA 138  K 3.9  CL 94*  CO2 29  GLUCOSE 81  BUN 12  CREATININE 3.41*  CALCIUM 9.9   Liver Function Tests:  Recent Labs Lab 02/09/17 2030  AST 26  ALT 15  ALKPHOS 58  BILITOT 0.9  PROT 6.8  ALBUMIN 3.8   No results for input(s): LIPASE, AMYLASE in the last 168 hours. No results for input(s): AMMONIA in the last 168 hours. CBC:  Recent Labs Lab 02/09/17 2030  WBC 6.7  NEUTROABS 4.2  HGB 11.8*  HCT 37.2  MCV 100.5*  PLT 123*   Cardiac Enzymes: No results for input(s): CKTOTAL, CKMB, CKMBINDEX, TROPONINI in the last 168 hours. BNP: Invalid input(s): POCBNP CBG: No results for input(s): GLUCAP in the last 168 hours. D-Dimer No results for input(s): DDIMER in the last 72 hours. Hgb A1c No results for input(s): HGBA1C in the last 72 hours. Lipid Profile No results for input(s): CHOL, HDL, LDLCALC, TRIG, CHOLHDL, LDLDIRECT  in the last 72 hours. Thyroid function studies No results for input(s): TSH, T4TOTAL, T3FREE, THYROIDAB in the last 72 hours.  Invalid input(s): FREET3 Anemia work up No results for input(s): VITAMINB12, FOLATE, FERRITIN, TIBC, IRON, RETICCTPCT in the last 72 hours. Urinalysis No results found for: COLORURINE, APPEARANCEUR, [place], [place], [place], [place], [place], [place], PROTEINUR, UROBILINOGEN, NITRITE, LEUKOCYTESUR Sepsis Labs Invalid input(s): PROCALCITONIN,  WBC,  LACTICIDVEN Microbiology Recent Results (from the past 240 hour(s))  MRSA PCR Screening     Status: None   Collection Time: 02/10/17  3:26 AM  Result Value Ref Range Status   MRSA by PCR NEGATIVE NEGATIVE Final    Comment:        The GeneXpert MRSA Assay (FDA approved for NASAL specimens only), is one component of a comprehensive MRSA colonization surveillance program. It is not intended to diagnose MRSA infection nor to guide or monitor treatment for MRSA infections.       SIGNED:   Cordelia Poche, MD [facility] 02/10/2017, 2:15 PM Pager [phone]  If 7PM-7AM, please contact night-coverage [email or web address] Password [email or web address]

## 2017-02-14 LAB — CULTURE, BLOOD (ROUTINE X 2)
Culture: NO GROWTH
SPECIAL REQUESTS: ADEQUATE

## 2017-02-15 LAB — CULTURE, BLOOD (ROUTINE X 2)
CULTURE: NO GROWTH
Special Requests: ADEQUATE

## 2017-03-02 LAB — PROTIME-INR: INR: 2.5 — AB (ref 0.9–1.1)

## 2017-03-06 ENCOUNTER — Encounter: Payer: Self-pay | Admitting: Pharmacist

## 2017-03-06 ENCOUNTER — Ambulatory Visit (INDEPENDENT_AMBULATORY_CARE_PROVIDER_SITE_OTHER): Payer: Medicare Other | Admitting: Pharmacist

## 2017-03-06 DIAGNOSIS — I4891 Unspecified atrial fibrillation: Secondary | ICD-10-CM | POA: Diagnosis not present

## 2017-03-06 DIAGNOSIS — Z5181 Encounter for therapeutic drug level monitoring: Secondary | ICD-10-CM

## 2017-03-06 DIAGNOSIS — Z7901 Long term (current) use of anticoagulants: Secondary | ICD-10-CM | POA: Diagnosis not present

## 2017-03-16 NOTE — Progress Notes (Signed)
  Cardiology Office Note   Date:  03/17/2017   ID:  [name], DOB 08/03/1921, MRN [id number]  PCP:  Green, Edwin, MD  Cardiologist:    , MD   Chief Complaint  Patient presents with  . Dizziness     History of Present Illness: [name] is a 83 y.o. female who presents for evaluation of aortic stenosis and atrial fib.  Since she was last seen in this office she was admitted with hypotension.   I reviewed these records for this visit.   She has had meds slowly reduced over the past several months.  Came into the hospital and  was sent home on Midodrine.  However, since that time this has been stopped and her dry weight has increased.  She has not had any further hypotension and has been able to complete dialysis.  There has been no presyncope or syncope.  There is been no chest pressure, neck or arm discomfort.  He sleeps chronically in a recliner.  This has been unchanged.  She is good about watching salt and fluid.   Past Medical History:  Diagnosis Date  . Anemia   . Aortic stenosis   . Benign bladder mass   . Chronic cough   . DJD (degenerative joint disease)   . Dyslipidemia   . ESRD on hemodialysis (HCC)    "TTS; [facility]" (09/27/2016)  . Gout   . HTN (hypertension)   . Obstructive sleep apnea    mild  . Orthostatic syncope    around year 2006  . Pneumonia   . TIA (transient ischemic attack)     Past Surgical History:  Procedure Laterality Date  . BONE MARROW BIOPSY    . CATARACT EXTRACTION W/ INTRAOCULAR LENS  IMPLANT, BILATERAL    . COLONOSCOPY W/ BIOPSIES AND POLYPECTOMY    . left arm dialysis graft    . NEPHRECTOMY     left  . TOTAL ABDOMINAL HYSTERECTOMY       Current Outpatient Medications  Medication Sig Dispense Refill  . acetaminophen (TYLENOL) 500 MG tablet Take 500 mg by mouth every 6 (six) hours as needed.    . midodrine (PROAMATINE) 10 MG tablet Take 1 tablet (10 mg total) by mouth 2 (two) times daily with a meal.  60 tablet 0  . multivitamin (RENA-VIT) TABS tablet Take 1 tablet by mouth daily.    . Nutritional Supplements (FEEDING SUPPLEMENT, NEPRO CARB STEADY,) LIQD Take 237 mLs by mouth 2 (two) times daily between meals. 60 Can 0  . SENSIPAR 30 MG tablet Take 30 mg by mouth daily.     . warfarin (COUMADIN) 2.5 MG tablet Take 1 to 1.5 tablets by mouth daily as directed by coumadin clinic (Patient taking differently: Take 3.75 mg by mouth at bedtime. ) 45 tablet 3  . amiodarone (PACERONE) 200 MG tablet Take 0.5 tablets (100 mg total) by mouth daily. (Patient not taking: Reported on 03/17/2017) 90 tablet 1  . metoprolol succinate (TOPROL-XL) 25 MG 24 hr tablet Take 12.5 mg by mouth daily as needed. irregular or high heart rate     No current facility-administered medications for this visit.     Allergies:   Tape and Sulfa antibiotics    ROS:  Please see the history of present illness.   Otherwise, review of systems are positive for none.   All other systems are reviewed and negative.    PHYSICAL EXAM: VS:  BP (!) 130/50   Pulse (!)   54   Ht 5' 3" (1.6 m)   Wt 130 lb (59 kg)   SpO2 96%   BMI 23.03 kg/m  , BMI Body mass index is 23.03 kg/m.  GENERAL:  Well appearing for her age NECK:  No jugular venous distention, waveform within normal limits, carotid upstroke brisk and symmetric, no bruits, no thyromegaly LUNGS:  Clear to auscultation bilaterally CHEST:  Unremarkable HEART:  PMI not displaced or sustained,S1 and S2 within normal limits, no S3, no S4, no clicks, no rubs, 3 out of 6 apical systolic murmur radiating at the aortic outflow tract in late peaking and radiating into the carotids, no diastolic murmurs ABD:  Flat, positive bowel sounds normal in frequency in pitch, no bruits, no rebound, no guarding, no midline pulsatile mass, no hepatomegaly, no splenomegaly EXT:  2 plus pulses throughout, no edema, no cyanosis no clubbing, right forearm dialysis fistula with slight pulsatile mass and  bruit   EKG:  EKG is not  ordered today.   Recent Labs: 08/20/2016: TSH 2.400 09/27/2016: B Natriuretic Peptide 1,532.2; Magnesium 2.2 02/09/2017: ALT 15; BUN 12; Creatinine, Ser 3.41; Hemoglobin 11.8; Platelets 123; Potassium 3.9; Sodium 138    Lipid Panel    Component Value Date/Time   CHOL 143 08/21/2016 0429   TRIG 62 08/21/2016 0429   HDL 68 08/21/2016 0429   CHOLHDL 2.1 08/21/2016 0429   VLDL 12 08/21/2016 0429   LDLCALC 63 08/21/2016 0429     Lab Results  Component Value Date   TSH 2.400 08/20/2016   ALT 15 02/09/2017   AST 26 02/09/2017   ALKPHOS 58 02/09/2017   BILITOT 0.9 02/09/2017   PROT 6.8 02/09/2017   ALBUMIN 3.8 02/09/2017     Wt Readings from Last 3 Encounters:  03/17/17 130 lb (59 kg)  02/10/17 120 lb 4.8 oz (54.6 kg)  01/23/17 127 lb (57.6 kg)      Other studies Reviewed: Additional studies/ records that were reviewed today include: Hospital records review from recent hospitalization.  Review of the above records demonstrates:     ASSESSMENT AND PLAN:  AS:   She does have severe AS but is controlled with dialysis.  She is not interested in aggressive therapy like TAVR so we continue to manage medically.     ATRIAL FIB:   [name] has a CHA2DS2 - VASc score of 6.  She has been maintaining NSR.  She tolerates warfarin.  No change in therapy.   PULMONARY HTN:   She has no symptoms and I will follow this clinically.   RBBB:   She does have bradycardia.  Metoprolol was stopped previously.  She has not had any presyncope or syncope.  No change in therapy.  HTN:  She has had severe hypotension as above.  He seems to be doing well with an adjustment to her dialysis.  No indication for the Midodrine.    Current medicines are reviewed at length with the patient today.  The patient does not have concerns regarding medicines.  The following changes have been made:  None  Labs/ tests ordered today include:  None  No orders of the  defined types were placed in this encounter.    Disposition:   FU with me in six months.     Signed, Minus Breeding, MD  03/17/2017 10:09 AM    [place]

## 2017-03-17 ENCOUNTER — Encounter: Payer: Self-pay | Admitting: Cardiology

## 2017-03-17 ENCOUNTER — Ambulatory Visit (INDEPENDENT_AMBULATORY_CARE_PROVIDER_SITE_OTHER): Payer: Medicare Other | Admitting: Cardiology

## 2017-03-17 VITALS — BP 130/50 | HR 54 | Ht 63.0 in | Wt 130.0 lb

## 2017-03-17 DIAGNOSIS — I959 Hypotension, unspecified: Secondary | ICD-10-CM

## 2017-03-17 DIAGNOSIS — I4891 Unspecified atrial fibrillation: Secondary | ICD-10-CM | POA: Diagnosis not present

## 2017-03-17 DIAGNOSIS — I350 Nonrheumatic aortic (valve) stenosis: Secondary | ICD-10-CM

## 2017-03-17 DIAGNOSIS — R001 Bradycardia, unspecified: Secondary | ICD-10-CM

## 2017-03-17 NOTE — Patient Instructions (Signed)

## 2017-04-06 LAB — PROTIME-INR: INR: 2.3 — AB (ref 0.9–1.1)

## 2017-04-10 ENCOUNTER — Encounter: Payer: Self-pay | Admitting: Pharmacist Clinician (PhC)/ Clinical Pharmacy Specialist

## 2017-04-10 ENCOUNTER — Ambulatory Visit (INDEPENDENT_AMBULATORY_CARE_PROVIDER_SITE_OTHER): Payer: Medicare Other | Admitting: Pharmacist Clinician (PhC)/ Clinical Pharmacy Specialist

## 2017-04-10 DIAGNOSIS — Z5181 Encounter for therapeutic drug level monitoring: Secondary | ICD-10-CM | POA: Diagnosis not present

## 2017-04-10 DIAGNOSIS — Z7901 Long term (current) use of anticoagulants: Secondary | ICD-10-CM | POA: Diagnosis not present

## 2017-04-10 DIAGNOSIS — I4891 Unspecified atrial fibrillation: Secondary | ICD-10-CM

## 2017-04-24 ENCOUNTER — Encounter (HOSPITAL_COMMUNITY): Payer: Self-pay | Admitting: Radiology

## 2017-04-24 ENCOUNTER — Emergency Department (HOSPITAL_COMMUNITY): Payer: Medicare Other

## 2017-04-24 ENCOUNTER — Inpatient Hospital Stay (HOSPITAL_COMMUNITY)
Admission: EM | Admit: 2017-04-24 | Discharge: 2017-04-26 | DRG: 312 | Disposition: A | Discharge status: Home / Self Care | Payer: Medicare Other | Attending: Internal Medicine | Admitting: Internal Medicine

## 2017-04-24 DIAGNOSIS — I350 Nonrheumatic aortic (valve) stenosis: Secondary | ICD-10-CM | POA: Diagnosis present

## 2017-04-24 DIAGNOSIS — G4733 Obstructive sleep apnea (adult) (pediatric): Secondary | ICD-10-CM | POA: Diagnosis present

## 2017-04-24 DIAGNOSIS — N186 End stage renal disease: Secondary | ICD-10-CM

## 2017-04-24 DIAGNOSIS — Z8249 Family history of ischemic heart disease and other diseases of the circulatory system: Secondary | ICD-10-CM

## 2017-04-24 DIAGNOSIS — I480 Paroxysmal atrial fibrillation: Secondary | ICD-10-CM | POA: Diagnosis present

## 2017-04-24 DIAGNOSIS — Z79899 Other long term (current) drug therapy: Secondary | ICD-10-CM

## 2017-04-24 DIAGNOSIS — I120 Hypertensive chronic kidney disease with stage 5 chronic kidney disease or end stage renal disease: Secondary | ICD-10-CM | POA: Diagnosis present

## 2017-04-24 DIAGNOSIS — I4510 Unspecified right bundle-branch block: Secondary | ICD-10-CM | POA: Diagnosis present

## 2017-04-24 DIAGNOSIS — M898X9 Other specified disorders of bone, unspecified site: Secondary | ICD-10-CM | POA: Diagnosis present

## 2017-04-24 DIAGNOSIS — Z9842 Cataract extraction status, left eye: Secondary | ICD-10-CM

## 2017-04-24 DIAGNOSIS — S2231XA Fracture of one rib, right side, initial encounter for closed fracture: Secondary | ICD-10-CM | POA: Diagnosis present

## 2017-04-24 DIAGNOSIS — Y92002 Bathroom of unspecified non-institutional (private) residence single-family (private) house as the place of occurrence of the external cause: Secondary | ICD-10-CM

## 2017-04-24 DIAGNOSIS — Z8673 Personal history of transient ischemic attack (TIA), and cerebral infarction without residual deficits: Secondary | ICD-10-CM

## 2017-04-24 DIAGNOSIS — I272 Pulmonary hypertension, unspecified: Secondary | ICD-10-CM | POA: Diagnosis present

## 2017-04-24 DIAGNOSIS — R55 Syncope and collapse: Principal | ICD-10-CM | POA: Diagnosis present

## 2017-04-24 DIAGNOSIS — Z882 Allergy status to sulfonamides status: Secondary | ICD-10-CM

## 2017-04-24 DIAGNOSIS — I440 Atrioventricular block, first degree: Secondary | ICD-10-CM | POA: Diagnosis present

## 2017-04-24 DIAGNOSIS — Z90710 Acquired absence of both cervix and uterus: Secondary | ICD-10-CM

## 2017-04-24 DIAGNOSIS — Z905 Acquired absence of kidney: Secondary | ICD-10-CM

## 2017-04-24 DIAGNOSIS — Z992 Dependence on renal dialysis: Secondary | ICD-10-CM

## 2017-04-24 DIAGNOSIS — I951 Orthostatic hypotension: Secondary | ICD-10-CM | POA: Diagnosis present

## 2017-04-24 DIAGNOSIS — Z961 Presence of intraocular lens: Secondary | ICD-10-CM | POA: Diagnosis present

## 2017-04-24 DIAGNOSIS — K2970 Gastritis, unspecified, without bleeding: Secondary | ICD-10-CM | POA: Diagnosis present

## 2017-04-24 DIAGNOSIS — W1811XA Fall from or off toilet without subsequent striking against object, initial encounter: Secondary | ICD-10-CM | POA: Diagnosis present

## 2017-04-24 DIAGNOSIS — Z91048 Other nonmedicinal substance allergy status: Secondary | ICD-10-CM

## 2017-04-24 DIAGNOSIS — D696 Thrombocytopenia, unspecified: Secondary | ICD-10-CM | POA: Diagnosis present

## 2017-04-24 DIAGNOSIS — Z9841 Cataract extraction status, right eye: Secondary | ICD-10-CM

## 2017-04-24 DIAGNOSIS — D631 Anemia in chronic kidney disease: Secondary | ICD-10-CM | POA: Diagnosis present

## 2017-04-24 DIAGNOSIS — N2581 Secondary hyperparathyroidism of renal origin: Secondary | ICD-10-CM | POA: Diagnosis present

## 2017-04-24 DIAGNOSIS — Z7901 Long term (current) use of anticoagulants: Secondary | ICD-10-CM

## 2017-04-24 DIAGNOSIS — I959 Hypotension, unspecified: Secondary | ICD-10-CM | POA: Diagnosis present

## 2017-04-24 LAB — CBC
HCT: 39.8 % (ref 36.0–46.0)
Hemoglobin: 13 g/dL (ref 12.0–15.0)
MCH: 32.2 pg (ref 26.0–34.0)
MCHC: 32.7 g/dL (ref 30.0–36.0)
MCV: 98.5 fL (ref 78.0–100.0)
PLATELETS: 126 10*3/uL — AB (ref 150–400)
RBC: 4.04 MIL/uL (ref 3.87–5.11)
RDW: 14.3 % (ref 11.5–15.5)
WBC: 5.2 10*3/uL (ref 4.0–10.5)

## 2017-04-24 LAB — PROTIME-INR
INR: 2.39
Prothrombin Time: 25.9 seconds — ABNORMAL HIGH (ref 11.4–15.2)

## 2017-04-24 LAB — CBG MONITORING, ED: GLUCOSE-CAPILLARY: 101 mg/dL — AB (ref 65–99)

## 2017-04-24 LAB — I-STAT TROPONIN, ED: Troponin i, poc: 0.02 ng/mL (ref 0.00–0.08)

## 2017-04-24 MED ORDER — IOPAMIDOL (ISOVUE-370) INJECTION 76%
INTRAVENOUS | Status: AC
  Administered 2017-04-24: 100 mL
  Filled 2017-04-24: qty 100

## 2017-04-24 MED ORDER — FENTANYL CITRATE (PF) 100 MCG/2ML IJ SOLN
25.0000 ug | Freq: Once | INTRAMUSCULAR | Status: AC
  Administered 2017-04-24: 25 ug via INTRAVENOUS
  Filled 2017-04-24: qty 2

## 2017-04-24 MED ORDER — OXYCODONE-ACETAMINOPHEN 5-325 MG PO TABS: 0.5000 | ORAL_TABLET | Freq: Once | ORAL | Status: DC

## 2017-04-24 MED ORDER — IOPAMIDOL (ISOVUE-370) INJECTION 76%
INTRAVENOUS | Status: AC
  Filled 2017-04-24: qty 100

## 2017-04-24 NOTE — ED Triage Notes (Signed)
Pt called out Thursday for hypotension was found to be orthostatic but refused to go to the hospital. Pt  Called out today for syncope. She was found on the bathroom floor, does not remember falling. She is dialysis and got a full treatment on Thursday not certain about Saturday. Pt is anticoagulated but denies head or neck pain. Pt is c/o of abdominal pain that is radiating into her chest. Right arm restricted.

## 2017-04-24 NOTE — ED Provider Notes (Signed)
[facility] EMERGENCY DEPARTMENT Provider Note   CSN: [id number] Arrival date & time: 04/24/17  1932     History   Chief Complaint Chief Complaint  Patient presents with  . Loss of Consciousness  . Chest Pain  . Abdominal Pain    HPI [name] is a 83 y.o. female.  Patient with h/o ESRD on dialysis Tues/Thurs/Sat, aortic stenosis, afib on coumadin, nephrectomy -- presents with c/o abdominal pain, syncopal episode, middle back pain starting just prior to arrival.  Family member at bedside states that the patient mentioned that she was having some abdominal discomfort and got up to use the restroom.  Family member heard the patient fall in the bathroom and went went to assist her.  She was awake when family member went to assist her -- does not think she tripped or fell.  EMS was called due to the fall, BP found to be in the 57'D systolic per family.  Upon arrival to the emergency department, patient is complaining of severe middle back pain.  She currently denies any abdominal pains.  No reported fevers, vomiting, diarrhea.  Last dialysis session (@ [facility]) 2 days ago was routine.  Ambulance was called for the patient 2 days prior to that due to hypotension but she refused transport to the hospital. The onset of this condition was acute. The course is constant. Aggravating factors: none. Alleviating factors: none.        Past Medical History:  Diagnosis Date  . Anemia   . Aortic stenosis   . Benign bladder mass   . Chronic cough   . DJD (degenerative joint disease)   . Dyslipidemia   . ESRD on hemodialysis ([place])    "TTS; [facility]" (09/27/2016)  . Gout   . HTN (hypertension)   . Obstructive sleep apnea    mild  . Orthostatic syncope    around year 2006  . Pneumonia   . TIA (transient ischemic attack)     Patient Active Problem List   Diagnosis Date Noted  . Hypotension 02/10/2017  . Lactic acidosis 02/10/2017  . Bradycardia  10/29/2016  . Acute respiratory failure ([place]) 09/27/2016  . Atypical chest pain 09/27/2016  . Essential hypertension 09/27/2016  . Acute pulmonary edema (HCC)   . Other chest pain   . Coumadin Rx 09/07/2016  . Monitoring for long-term anticoagulant use 08/29/2016  . Atrial flutter ([place])   . PAF (paroxysmal atrial fibrillation) ([place]) 08/22/2016  . Atrial fibrillation with RVR ([place]) 08/20/2016  . Severe aortic stenosis 08/20/2016  . New onset a-fib ([place]) 08/20/2016  . Staphylococcus aureus bacteremia   . 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 Bleeding from dialysis shunt ([place])   . Nausea   . Fever in adult 11/27/2015  . Hemorrhage of arteriovenous graft ([place]) 11/27/2015  . HLD (hyperlipidemia) 11/27/2015  . Bleeding 11/27/2015  . Influenza A with respiratory manifestations 05/08/2013  . Cough 05/08/2013  . Chronic cough 02/15/2013  . Secondary hyperparathyroidism ([place]) 11/21/2012  . ESRD on dialysis [facility]) 11/20/2012    Past Surgical History:  Procedure Laterality Date  . BONE MARROW BIOPSY    . CATARACT EXTRACTION W/ INTRAOCULAR LENS  IMPLANT, BILATERAL    . COLONOSCOPY W/ BIOPSIES AND POLYPECTOMY    . left arm dialysis graft    . NEPHRECTOMY     left  . REVISION OF ARTERIOVENOUS GORETEX GRAFT Right 11/24/2014   Procedure: REPLACEMENT OF  MEDIAL SIDE OF RIGHT  FORARM ARTERIOVENOUS GORETEX GRAFT;  Surgeon: Conrad , MD;  Location: [facility];  Service: Vascular;  Laterality: Right;  . REVISION OF ARTERIOVENOUS GORETEX GRAFT Right 11/28/2015   Procedure: REVISION OF RIGHT ARM ARTERIOVENOUS GORETEX GRAFT;  Surgeon: Serafina Mitchell, MD;  Location: [facility];  Service: Vascular;  Laterality: Right;  . TOTAL ABDOMINAL HYSTERECTOMY      OB History    No data available       Home Medications    Prior to Admission medications   Medication Sig Start Date End Date Taking? Authorizing Provider  acetaminophen (TYLENOL) 500 MG tablet  Take 500 mg by mouth every 6 (six) hours as needed.    [provider]  amiodarone (PACERONE) 200 MG tablet Take 0.5 tablets (100 mg total) by mouth daily. Patient not taking: Reported on 03/17/2017 12/14/16   Barrett, Evelene Croon, PA-C  metoprolol succinate (TOPROL-XL) 25 MG 24 hr tablet Take 12.5 mg by mouth daily as needed. irregular or high heart rate 11/29/16   [provider]  midodrine (PROAMATINE) 10 MG tablet Take 1 tablet (10 mg total) by mouth 2 (two) times daily with a meal. 02/10/17   Mariel Aloe, MD  multivitamin (RENA-VIT) TABS tablet Take 1 tablet by mouth daily.    [provider]  Nutritional Supplements (FEEDING SUPPLEMENT, NEPRO CARB STEADY,) LIQD Take 237 mLs by mouth 2 (two) times daily between meals. 02/10/17   Mariel Aloe, MD  SENSIPAR 30 MG tablet Take 30 mg by mouth daily.  02/14/13   [provider]  warfarin (COUMADIN) 2.5 MG tablet Take 1 to 1.5 tablets by mouth daily as directed by coumadin clinic Patient taking differently: Take 3.75 mg by mouth at bedtime.  11/11/16   Minus Breeding, MD    Family History Family History  Problem Relation Age of Onset  . Hypertension Mother   . Diabetes Mother   . Hypertension Other     Social History Social History   Tobacco Use  . Smoking status: Never Smoker  . Smokeless tobacco: Never Used  Substance Use Topics  . Alcohol use: No    Alcohol/week: 0.0 oz  . Drug use: No     Allergies   Tape and Sulfa antibiotics   Review of Systems Review of Systems  Constitutional: Negative for fever.  HENT: Negative for rhinorrhea and sore throat.   Eyes: Negative for redness.  Respiratory: Negative for cough and shortness of breath.   Cardiovascular: Positive for chest pain.  Gastrointestinal: Positive for abdominal pain. Negative for diarrhea, nausea and vomiting.  Genitourinary: Negative for dysuria.  Musculoskeletal: Positive for back pain. Negative for myalgias.  Skin: Negative for  rash.  Neurological: Positive for syncope. Negative for headaches.     Physical Exam Updated Vital Signs BP 140/67   Pulse (!) 55   Temp 98 F (36.7 C) (Oral)   Resp 15   SpO2 98%   Physical Exam  Constitutional: She appears well-developed and well-nourished.  HENT:  Head: Normocephalic and atraumatic.  Eyes: Conjunctivae are normal. Right eye exhibits no discharge. Left eye exhibits no discharge.  Neck: Normal range of motion. Neck supple.  Cardiovascular: Normal rate, regular rhythm and normal heart sounds.  Pulses:      Radial pulses are 2+ on the right side, and 2+ on the left side.  Pulmonary/Chest: Effort normal and breath sounds normal. She has no decreased breath sounds.  Abdominal: Soft. There is no tenderness.  Musculoskeletal:  Cervical back: She exhibits no tenderness and no bony tenderness.       Thoracic back: She exhibits tenderness and bony tenderness.       Lumbar back: She exhibits no tenderness, no bony tenderness and no swelling.       Back:  Neurological: She is alert.  Skin: Skin is warm and dry.  Psychiatric: She has a normal mood and affect.  Nursing note and vitals reviewed.    ED Treatments / Results  Labs (all labs ordered are listed, but only abnormal results are displayed) Labs Reviewed  CBC - Abnormal; Notable for the following components:      Result Value   Platelets 126 (*)    All other components within normal limits  PROTIME-INR - Abnormal; Notable for the following components:   Prothrombin Time 25.9 (*)    All other components within normal limits  CBG MONITORING, ED - Abnormal; Notable for the following components:   Glucose-Capillary 101 (*)    All other components within normal limits  URINALYSIS, ROUTINE W REFLEX MICROSCOPIC  I-STAT TROPONIN, ED    EKG  EKG Interpretation  Date/Time:  Monday April 24 2017 19:44:44 EST Ventricular Rate:  67 PR Interval:    QRS Duration: 146 QT Interval:  462 QTC  Calculation: 473 R Axis:   -26 Text Interpretation:  Sinus rhythm Ventricular premature complex Sinus pause Right bundle branch block Baseline wander in lead(s) II III aVF Since last tracing rate slower Otherwise no significant change Confirmed by Deno Etienne [phone]) on 04/24/2017 8:25:41 PM       Radiology Dg Chest Portable 1 View  Result Date: 04/24/2017 CLINICAL DATA:  Pt called out Thursday for hypotension was found to be orthostatic but refused to go to the hospital. Pt Called out today for syncope. EXAM: PORTABLE CHEST 1 VIEW COMPARISON:  Chest x-ray dated 02/09/2017. FINDINGS: Stable cardiomegaly. Aortic atherosclerosis. Chronic fibrosis and/or chronic bronchitic changes at the lung bases, stable. No confluent opacity to suggest a developing pneumonia. No pleural effusion or pneumothorax seen. Osseous structures about the chest are unremarkable. IMPRESSION: 1. No active disease.  No evidence of pneumonia or pulmonary edema. 2. Stable cardiomegaly. 3. Chronic bronchitic changes and/or fibrosis at the lung bases. 4. Aortic atherosclerosis. Electronically Signed   By: Franki Cabot M.D.   On: 04/24/2017 21:00   Ct Angio Chest/abd/pel For Dissection W And/or Wo Contrast  Result Date: 04/24/2017 CLINICAL DATA:  Syncopal episode and fall. Mid abdominal and chest pain. Back injury. History of end-stage renal disease on dialysis, hypertension, RIGHT brachiocephalic pseudo aneurysm. EXAM: CT ANGIOGRAPHY CHEST, ABDOMEN AND PELVIS TECHNIQUE: Multidetector CT imaging through the chest, abdomen and pelvis was performed using the standard protocol during bolus administration of intravenous contrast. Multiplanar reconstructed images and MIPs were obtained and reviewed to evaluate the vascular anatomy. CONTRAST:  <See Chart> ISOVUE-370 IOPAMIDOL (ISOVUE-370) INJECTION 76% COMPARISON:  Chest radiograph April 24, 2017 at 2022 hours and CT abdomen and pelvis February 09, 2018 FINDINGS: CTA CHEST FINDINGS  CARDIOVASCULAR: Thoracic aorta is normal course and caliber. Moderate to severe calcific atherosclerosis aortic arch. No intrinsic density on noncontrast CT. Homogeneous contrast opacification of thoracic aorta without dissection, aneurysm, luminal irregularity, periaortic fluid collections, or contrast extravasation. The heart is mildly enlarged. Moderate coronary artery calcifications. No pericardial effusion. Moderate stenosis brachiocephalic confluence with turbulent blood flow in heterogeneous contrast opacification. Though not tailored for evaluation. No central pulmonary emboli. MEDIASTINUM/NODES: No mediastinal mass or lymphadenopathy by CT size criteria. LUNGS/PLEURA: Tracheobronchial  tree is patent, no pneumothorax. Bilateral lower lobe atelectasis. No pleural effusions, focal consolidations, pulmonary nodules or masses. MUSCULOSKELETAL: Respiratory motion versus nondisplaced RIGHT posterolateral fourth rib fracture. Osteopenia. Large LEFT subacromial bursal collection. Small RIGHT subacromial bursal collection. Old sternal fracture. Severe degenerative change of thoracic spine. Review of the MIP images confirms the above findings. CTA ABDOMEN AND PELVIS FINDINGS VASCULAR Aorta: Abdominal aorta is normal course and caliber. , moderate calcific atherosclerosis. Homogeneous contrast opacification of aortoiliac vessels without dissection, aneurysm, luminal irregularity, periaortic fluid collections, or contrast extravasation. Celiac: Patent. SMA: Patent. Renals: Patent. Moderate stenosis RIGHT renal artery. Absent LEFT renal artery. IMA: Patent. Inflow: Negative. Veins: Negative, not tailored for evaluation. Review of the MIP images confirms the above findings. NON-VASCULAR HEPATOBILIARY: Re- demonstration of multiple hepatic, and scattered hepatic calcifications. Normal gallbladder. PANCREAS: Normal. SPLEEN: Normal. ADRENALS/URINARY TRACT: Absent RIGHT kidney. Extremely atrophic RIGHT kidney with 15 mm cyst.  Urinary bladder is decompressed. STOMACH/BOWEL: Thickened, irregular gastric antrum. Mildly hyperemic proximal duodenum. The small and large bowel are normal in course and caliber without inflammatory changes, sensitivity decreased without oral contrast. Proximal small bowel lipoma, unchanged. Severe descending and sigmoid colonic diverticulosis. Additional scattered colonic diverticula. Moderate amount of retained large bowel stool. VASCULAR/LYMPHATIC: No lymphadenopathy by CT size criteria. REPRODUCTIVE: Normal. OTHER: No intraperitoneal free fluid or free air. MUSCULOSKELETAL: Nonacute. Stable acetabular subchondral cysts. Osteopenia. Severe degenerative change of lumbar spine with grade 1 L4-5 anterolisthesis on degenerative basis. Mild broad lumbar levoscoliosis. Review of the MIP images confirms the above findings. IMPRESSION: CTA CHEST: 1. No acute vascular process. 2. Moderate cardiomegaly.  Bilateral atelectasis. 3. Acute nondisplaced RIGHT fourth rib fracture versus respiratory motion artifact. CTA ABDOMEN AND PELVIS: 1. No acute vascular process. 2. Thickened irregular gastric antrum most consistent with gastritis new from October 2018. 3. Atrophic RIGHT kidney and absent LEFT kidney. Aortic Atherosclerosis (ICD10-I70.0). Electronically Signed   By: Elon Alas M.D.   On: 04/24/2017 22:43    Procedures Procedures (including critical care time)  Medications Ordered in ED Medications  iopamidol (ISOVUE-370) 76 % injection (not administered)  oxyCODONE (ROXICODONE) 5 MG/5ML solution 2.5 mg (not administered)  fentaNYL (SUBLIMAZE) injection 25 mcg (25 mcg Intravenous Given 04/24/17 2042)  iopamidol (ISOVUE-370) 76 % injection (100 mLs  Contrast Given 04/24/17 2138)  fentaNYL (SUBLIMAZE) injection 25 mcg (25 mcg Intravenous Given 04/24/17 2327)     Initial Impression / Assessment and Plan / ED Course  I have reviewed the triage vital signs and the nursing notes.  Pertinent labs &  imaging results that were available during my care of the patient were reviewed by me and considered in my medical decision making (see chart for details).     Patient seen and examined. Work-up initiated. Discussed with Dr. Tyrone Nine. Imaging ordered.   Vital signs reviewed and are as follows: BP (!) 118/46   Pulse 61   Temp 98 F (36.7 C) (Oral)   Resp 15   SpO2 100%   Pt seen by Dr. Tyrone Nine. We discussed imaging results.   I have reevaluated patient after her second dose of fentanyl, she continues to have poor control of pain.  I have concerns that if patient goes home, her pain will not be well controlled and she may not be able to go to dialysis tomorrow.  Also, questionable syncopal episode is also concerning.  Will request admission from hospitalist for syncope workup, pain control, routine dialysis.  12:45 AM Spoke with Dr. Myna Hidalgo. Realized that previously ordered CMP was  canceled by interface. This was re-ordered with [id number]. RN notified and will draw these ASAP.   Final Clinical Impressions(s) / ED Diagnoses   Final diagnoses:  Syncope, unspecified syncope type  Closed fracture of one rib of right side, initial encounter   Admit.   ED Discharge Orders    None       Carlisle Cater, PA-C 04/25/17 Crosby, [place], DO 04/25/17 1504

## 2017-04-25 ENCOUNTER — Observation Stay (HOSPITAL_COMMUNITY): Payer: Medicare Other

## 2017-04-25 ENCOUNTER — Other Ambulatory Visit: Payer: Self-pay

## 2017-04-25 ENCOUNTER — Encounter (HOSPITAL_COMMUNITY): Payer: Self-pay | Admitting: Family Medicine

## 2017-04-25 ENCOUNTER — Other Ambulatory Visit (HOSPITAL_COMMUNITY): Payer: Medicare Other

## 2017-04-25 DIAGNOSIS — I480 Paroxysmal atrial fibrillation: Secondary | ICD-10-CM

## 2017-04-25 DIAGNOSIS — Z8673 Personal history of transient ischemic attack (TIA), and cerebral infarction without residual deficits: Secondary | ICD-10-CM | POA: Diagnosis not present

## 2017-04-25 DIAGNOSIS — Z961 Presence of intraocular lens: Secondary | ICD-10-CM | POA: Diagnosis present

## 2017-04-25 DIAGNOSIS — Z882 Allergy status to sulfonamides status: Secondary | ICD-10-CM | POA: Diagnosis not present

## 2017-04-25 DIAGNOSIS — I120 Hypertensive chronic kidney disease with stage 5 chronic kidney disease or end stage renal disease: Secondary | ICD-10-CM | POA: Diagnosis present

## 2017-04-25 DIAGNOSIS — D696 Thrombocytopenia, unspecified: Secondary | ICD-10-CM | POA: Diagnosis present

## 2017-04-25 DIAGNOSIS — Z91048 Other nonmedicinal substance allergy status: Secondary | ICD-10-CM | POA: Diagnosis not present

## 2017-04-25 DIAGNOSIS — R55 Syncope and collapse: Principal | ICD-10-CM

## 2017-04-25 DIAGNOSIS — N2581 Secondary hyperparathyroidism of renal origin: Secondary | ICD-10-CM | POA: Diagnosis present

## 2017-04-25 DIAGNOSIS — N186 End stage renal disease: Secondary | ICD-10-CM | POA: Diagnosis present

## 2017-04-25 DIAGNOSIS — I951 Orthostatic hypotension: Secondary | ICD-10-CM | POA: Diagnosis present

## 2017-04-25 DIAGNOSIS — Z992 Dependence on renal dialysis: Secondary | ICD-10-CM | POA: Diagnosis not present

## 2017-04-25 DIAGNOSIS — K2970 Gastritis, unspecified, without bleeding: Secondary | ICD-10-CM | POA: Diagnosis present

## 2017-04-25 DIAGNOSIS — Z90710 Acquired absence of both cervix and uterus: Secondary | ICD-10-CM | POA: Diagnosis not present

## 2017-04-25 DIAGNOSIS — Y92002 Bathroom of unspecified non-institutional (private) residence single-family (private) house as the place of occurrence of the external cause: Secondary | ICD-10-CM | POA: Diagnosis not present

## 2017-04-25 DIAGNOSIS — D631 Anemia in chronic kidney disease: Secondary | ICD-10-CM | POA: Diagnosis present

## 2017-04-25 DIAGNOSIS — Z7901 Long term (current) use of anticoagulants: Secondary | ICD-10-CM | POA: Diagnosis not present

## 2017-04-25 DIAGNOSIS — Z79899 Other long term (current) drug therapy: Secondary | ICD-10-CM | POA: Diagnosis not present

## 2017-04-25 DIAGNOSIS — W1811XA Fall from or off toilet without subsequent striking against object, initial encounter: Secondary | ICD-10-CM | POA: Diagnosis present

## 2017-04-25 DIAGNOSIS — G4733 Obstructive sleep apnea (adult) (pediatric): Secondary | ICD-10-CM | POA: Diagnosis present

## 2017-04-25 DIAGNOSIS — M898X9 Other specified disorders of bone, unspecified site: Secondary | ICD-10-CM | POA: Diagnosis present

## 2017-04-25 DIAGNOSIS — Z9841 Cataract extraction status, right eye: Secondary | ICD-10-CM | POA: Diagnosis not present

## 2017-04-25 DIAGNOSIS — I350 Nonrheumatic aortic (valve) stenosis: Secondary | ICD-10-CM | POA: Diagnosis present

## 2017-04-25 DIAGNOSIS — S2231XA Fracture of one rib, right side, initial encounter for closed fracture: Secondary | ICD-10-CM | POA: Diagnosis present

## 2017-04-25 DIAGNOSIS — I361 Nonrheumatic tricuspid (valve) insufficiency: Secondary | ICD-10-CM | POA: Diagnosis not present

## 2017-04-25 DIAGNOSIS — Z9842 Cataract extraction status, left eye: Secondary | ICD-10-CM | POA: Diagnosis not present

## 2017-04-25 DIAGNOSIS — Z905 Acquired absence of kidney: Secondary | ICD-10-CM | POA: Diagnosis not present

## 2017-04-25 LAB — CBC
HEMATOCRIT: 32 % — AB (ref 36.0–46.0)
Hemoglobin: 10.4 g/dL — ABNORMAL LOW (ref 12.0–15.0)
MCH: 32.3 pg (ref 26.0–34.0)
MCHC: 32.5 g/dL (ref 30.0–36.0)
MCV: 99.4 fL (ref 78.0–100.0)
RBC: 3.22 MIL/uL — AB (ref 3.87–5.11)
RDW: 14.5 % (ref 11.5–15.5)
WBC: 4.9 10*3/uL (ref 4.0–10.5)

## 2017-04-25 LAB — COMPREHENSIVE METABOLIC PANEL
ALT: 12 U/L — AB (ref 14–54)
ANION GAP: 12 (ref 5–15)
AST: 22 U/L (ref 15–41)
Albumin: 3.4 g/dL — ABNORMAL LOW (ref 3.5–5.0)
Alkaline Phosphatase: 59 U/L (ref 38–126)
BUN: 24 mg/dL — ABNORMAL HIGH (ref 6–20)
CALCIUM: 9 mg/dL (ref 8.9–10.3)
CHLORIDE: 92 mmol/L — AB (ref 101–111)
CO2: 29 mmol/L (ref 22–32)
CREATININE: 6.7 mg/dL — AB (ref 0.44–1.00)
GFR, EST AFRICAN AMERICAN: 5 mL/min — AB (ref >60)
GFR, EST NON AFRICAN AMERICAN: 5 mL/min — AB (ref >60)
Glucose, Bld: 100 mg/dL — ABNORMAL HIGH (ref 65–99)
Potassium: 5.1 mmol/L (ref 3.5–5.1)
Sodium: 133 mmol/L — ABNORMAL LOW (ref 135–145)
Total Bilirubin: 0.7 mg/dL (ref 0.3–1.2)
Total Protein: 5.8 g/dL — ABNORMAL LOW (ref 6.5–8.1)

## 2017-04-25 LAB — CBG MONITORING, ED: GLUCOSE-CAPILLARY: 83 mg/dL (ref 65–99)

## 2017-04-25 LAB — BASIC METABOLIC PANEL
Anion gap: 12 (ref 5–15)
BUN: 25 mg/dL — AB (ref 6–20)
CHLORIDE: 94 mmol/L — AB (ref 101–111)
CO2: 27 mmol/L (ref 22–32)
CREATININE: 7.01 mg/dL — AB (ref 0.44–1.00)
Calcium: 9 mg/dL (ref 8.9–10.3)
GFR, EST AFRICAN AMERICAN: 5 mL/min — AB (ref >60)
GFR, EST NON AFRICAN AMERICAN: 4 mL/min — AB (ref >60)
Glucose, Bld: 91 mg/dL (ref 65–99)
POTASSIUM: 5.4 mmol/L — AB (ref 3.5–5.1)
SODIUM: 133 mmol/L — AB (ref 135–145)

## 2017-04-25 LAB — I-STAT CHEM 8, ED
BUN: 26 mg/dL — ABNORMAL HIGH (ref 6–20)
CALCIUM ION: 1.07 mmol/L — AB (ref 1.15–1.40)
CREATININE: 7.1 mg/dL — AB (ref 0.44–1.00)
Chloride: 94 mmol/L — ABNORMAL LOW (ref 101–111)
GLUCOSE: 99 mg/dL (ref 65–99)
HCT: 33 % — ABNORMAL LOW (ref 36.0–46.0)
HEMOGLOBIN: 11.2 g/dL — AB (ref 12.0–15.0)
Potassium: 5 mmol/L (ref 3.5–5.1)
Sodium: 132 mmol/L — ABNORMAL LOW (ref 135–145)
TCO2: 31 mmol/L (ref 22–32)

## 2017-04-25 LAB — PROTIME-INR
INR: 2.73
PROTHROMBIN TIME: 28.7 s — AB (ref 11.4–15.2)

## 2017-04-25 LAB — TROPONIN I: TROPONIN I: 0.03 ng/mL — AB (ref <0.03)

## 2017-04-25 MED ORDER — WARFARIN SODIUM 2.5 MG PO TABS
3.7500 mg | ORAL_TABLET | Freq: Every day | ORAL | Status: DC
  Filled 2017-04-25 (×2): qty 1

## 2017-04-25 MED ORDER — RENA-VITE PO TABS
1.0000 | ORAL_TABLET | Freq: Every day | ORAL | Status: DC
  Administered 2017-04-25 – 2017-04-26 (×2): 1 via ORAL
  Filled 2017-04-25 (×3): qty 1

## 2017-04-25 MED ORDER — WARFARIN - PHARMACIST DOSING INPATIENT: Freq: Every day | Status: DC

## 2017-04-25 MED ORDER — NEPRO/CARBSTEADY PO LIQD
237.0000 mL | Freq: Two times a day (BID) | ORAL | Status: DC
  Administered 2017-04-26 (×2): 237 mL via ORAL
  Filled 2017-04-25 (×6): qty 237

## 2017-04-25 MED ORDER — ONDANSETRON HCL 4 MG PO TABS: 4.0000 mg | ORAL_TABLET | Freq: Four times a day (QID) | ORAL | Status: DC | PRN

## 2017-04-25 MED ORDER — SODIUM CHLORIDE 0.9% FLUSH
3.0000 mL | Freq: Two times a day (BID) | INTRAVENOUS | Status: DC
  Administered 2017-04-25: 3 mL via INTRAVENOUS

## 2017-04-25 MED ORDER — HEPARIN SODIUM (PORCINE) 5000 UNIT/ML IJ SOLN: 5000.0000 [IU] | Freq: Three times a day (TID) | INTRAMUSCULAR | Status: DC

## 2017-04-25 MED ORDER — SODIUM CHLORIDE 0.9 % IV SOLN: 250.0000 mL | INTRAVENOUS | Status: DC | PRN

## 2017-04-25 MED ORDER — ACETAMINOPHEN 650 MG RE SUPP: 650.0000 mg | Freq: Four times a day (QID) | RECTAL | Status: DC | PRN

## 2017-04-25 MED ORDER — SODIUM CHLORIDE 0.9% FLUSH: 3.0000 mL | INTRAVENOUS | Status: DC | PRN

## 2017-04-25 MED ORDER — OXYCODONE HCL 5 MG/5ML PO SOLN
2.5000 mg | Freq: Once | ORAL | Status: AC
  Administered 2017-04-25: 2.5 mg via ORAL
  Filled 2017-04-25: qty 5

## 2017-04-25 MED ORDER — BISACODYL 5 MG PO TBEC: 5.0000 mg | DELAYED_RELEASE_TABLET | Freq: Every day | ORAL | Status: DC | PRN

## 2017-04-25 MED ORDER — SODIUM CHLORIDE 0.9% FLUSH
3.0000 mL | Freq: Two times a day (BID) | INTRAVENOUS | Status: DC
  Administered 2017-04-25 – 2017-04-26 (×2): 3 mL via INTRAVENOUS

## 2017-04-25 MED ORDER — DOXERCALCIFEROL 4 MCG/2ML IV SOLN
INTRAVENOUS | Status: AC
  Administered 2017-04-25: 2 ug via INTRAVENOUS
  Filled 2017-04-25: qty 2

## 2017-04-25 MED ORDER — WARFARIN SODIUM 2.5 MG PO TABS
3.7500 mg | ORAL_TABLET | Freq: Once | ORAL | Status: DC
  Filled 2017-04-25: qty 1

## 2017-04-25 MED ORDER — DOXERCALCIFEROL 4 MCG/2ML IV SOLN
2.0000 ug | INTRAVENOUS | Status: DC
  Administered 2017-04-25: 2 ug via INTRAVENOUS

## 2017-04-25 MED ORDER — HYDROMORPHONE HCL 1 MG/ML IJ SOLN: 0.5000 mg | INTRAMUSCULAR | Status: DC | PRN

## 2017-04-25 MED ORDER — SENNOSIDES-DOCUSATE SODIUM 8.6-50 MG PO TABS
1.0000 | ORAL_TABLET | Freq: Every evening | ORAL | Status: DC | PRN
  Administered 2017-04-25: 1 via ORAL
  Filled 2017-04-25: qty 1

## 2017-04-25 MED ORDER — AMIODARONE HCL 100 MG PO TABS
100.0000 mg | ORAL_TABLET | Freq: Every day | ORAL | Status: DC
  Administered 2017-04-25 (×2): 100 mg via ORAL
  Filled 2017-04-25 (×2): qty 1

## 2017-04-25 MED ORDER — ACETAMINOPHEN 325 MG PO TABS: 650.0000 mg | ORAL_TABLET | Freq: Four times a day (QID) | ORAL | Status: DC | PRN

## 2017-04-25 MED ORDER — CINACALCET HCL 30 MG PO TABS
30.0000 mg | ORAL_TABLET | Freq: Every day | ORAL | Status: DC
  Administered 2017-04-25 – 2017-04-26 (×2): 30 mg via ORAL
  Filled 2017-04-25 (×3): qty 1

## 2017-04-25 MED ORDER — OXYCODONE HCL 5 MG PO TABS
5.0000 mg | ORAL_TABLET | ORAL | Status: DC | PRN
  Administered 2017-04-25: 5 mg via ORAL
  Filled 2017-04-25: qty 1

## 2017-04-25 MED ORDER — LIDOCAINE HCL (PF) 1 % IJ SOLN: 5.0000 mL | INTRAMUSCULAR | Status: DC | PRN

## 2017-04-25 MED ORDER — ONDANSETRON HCL 4 MG/2ML IJ SOLN: 4.0000 mg | Freq: Four times a day (QID) | INTRAMUSCULAR | Status: DC | PRN

## 2017-04-25 MED ORDER — HEPARIN SODIUM (PORCINE) 1000 UNIT/ML DIALYSIS
20.0000 [IU]/kg | INTRAMUSCULAR | Status: DC | PRN
  Filled 2017-04-25: qty 2

## 2017-04-25 MED ORDER — SODIUM CHLORIDE 0.9 % IV SOLN: 100.0000 mL | INTRAVENOUS | Status: DC | PRN

## 2017-04-25 MED ORDER — LIDOCAINE-PRILOCAINE 2.5-2.5 % EX CREA
1.0000 "application " | TOPICAL_CREAM | CUTANEOUS | Status: DC | PRN
  Filled 2017-04-25: qty 5

## 2017-04-25 MED ORDER — PENTAFLUOROPROP-TETRAFLUOROETH EX AERO
1.0000 "application " | INHALATION_SPRAY | CUTANEOUS | Status: DC | PRN
  Filled 2017-04-25: qty 30

## 2017-04-25 MED ORDER — HEPARIN SODIUM (PORCINE) 1000 UNIT/ML DIALYSIS
1000.0000 [IU] | INTRAMUSCULAR | Status: DC | PRN
  Filled 2017-04-25: qty 1

## 2017-04-25 MED ORDER — FAMOTIDINE IN NACL 20-0.9 MG/50ML-% IV SOLN
20.0000 mg | INTRAVENOUS | Status: DC
  Administered 2017-04-25 – 2017-04-26 (×2): 20 mg via INTRAVENOUS
  Filled 2017-04-25 (×2): qty 50

## 2017-04-25 NOTE — ED Notes (Signed)
Renal w/ 1200 ML Fluid restriction diet lunch tray ordered @ 1327.

## 2017-04-25 NOTE — ED Notes (Signed)
Patient's daughter asking for pain medication.  When assessing pain with patient patient denies, except occasionally with movement.  Pt states she does not want pain medication at this time.

## 2017-04-25 NOTE — H&P (Signed)
History and Physical    [name] HQP:[id number] DOB: 09-01-1921 DOA: 04/24/2017  PCP: Levin Erp, MD   Patient coming from: Home  Chief Complaint: Syncope   HPI: [name] is a 83 y.o. female with medical history significant for end-stage renal disease on hemodialysis, aortic stenosis, and recent hypotension, now presenting to the emergency department after an apparent syncopal episode.  EMS was reportedly called out to the patient's house on 04/20/2017 for evaluation of hypotension, but she refused transport to the hospital at that time.  EMS was called out again today for an apparent syncopal episode after the patient had complained of upper abdominal discomfort, but walked to the restroom, and was then heard falling by family.  Patient had regained consciousness within seconds, denied hitting her head, and reports pain in the mid back.  She remembers sitting on the toilet, and then going to get up, and then recalls waking up on the floor with back pain.  There was no chest pain or palpitations associated with this.  ED Course: Upon arrival to the ED, patient is found to be afebrile, saturating well on room air, and with vitals otherwise stable.  EKG features a sinus rhythm with PVCs and RBBB.  Chest x-rays negative for acute cardiopulmonary disease.  CTA chest/abdomen/pelvis is negative for acute vascular process, but notable for acute nondisplaced right fourth rib fracture, as well as thickened gastric antrum, likely reflecting gastritis.  Patient was treated with multiple doses of fentanyl as well as oxycodone in the ED.  She continues to be in pain.  She will be observed on the telemetry unit for ongoing evaluation and management of his syncopal episode with fall and resulting rib fracture.  Review of Systems:  All other systems reviewed and apart from HPI, are negative.  Past Medical History:  Diagnosis Date  . Anemia   . Aortic stenosis   . Benign bladder mass   .  Chronic cough   . DJD (degenerative joint disease)   . Dyslipidemia   . ESRD on hemodialysis ([place])    "TTS; [facility]" (09/27/2016)  . Gout   . HTN (hypertension)   . Obstructive sleep apnea    mild  . Orthostatic syncope    around year 2006  . Pneumonia   . TIA (transient ischemic attack)     Past Surgical History:  Procedure Laterality Date  . BONE MARROW BIOPSY    . CATARACT EXTRACTION W/ INTRAOCULAR LENS  IMPLANT, BILATERAL    . COLONOSCOPY W/ BIOPSIES AND POLYPECTOMY    . left arm dialysis graft    . NEPHRECTOMY     left  . REVISION OF ARTERIOVENOUS GORETEX GRAFT Right 11/24/2014   Procedure: REPLACEMENT OF  MEDIAL SIDE OF RIGHT FORARM ARTERIOVENOUS GORETEX GRAFT;  Surgeon: Conrad Coop[place], MD;  Location: [facility];  Service: Vascular;  Laterality: Right;  . REVISION OF ARTERIOVENOUS GORETEX GRAFT Right 11/28/2015   Procedure: REVISION OF RIGHT ARM ARTERIOVENOUS GORETEX GRAFT;  Surgeon: Serafina Mitchell, MD;  Location: [facility];  Service: Vascular;  Laterality: Right;  . TOTAL ABDOMINAL HYSTERECTOMY       reports that  has never smoked. she has never used smokeless tobacco. She reports that she does not drink alcohol or use drugs.  Allergies  Allergen Reactions  . Tape Other (See Comments)    Pulls off skin  . Sulfa Antibiotics Other (See Comments)    Bumps all over body    Family History  Problem Relation  Age of Onset  . Hypertension Mother   . Diabetes Mother   . Hypertension Other      Prior to Admission medications   Medication Sig Start Date End Date Taking? Authorizing Provider  amiodarone (PACERONE) 200 MG tablet Take 0.5 tablets (100 mg total) by mouth daily. Patient taking differently: Take 100 mg by mouth at bedtime.  12/14/16  Yes Barrett, Evelene Croon, PA-C  multivitamin (RENA-VIT) TABS tablet Take 1 tablet by mouth daily.   Yes [provider]  Nutritional Supplements (FEEDING SUPPLEMENT, NEPRO CARB STEADY,) LIQD Take 237 mLs by mouth 2 (two) times daily  between meals. 02/10/17  Yes Mariel Aloe, MD  SENSIPAR 30 MG tablet Take 30 mg by mouth at bedtime.  02/14/13  Yes [provider]  warfarin (COUMADIN) 2.5 MG tablet Take 1 to 1.5 tablets by mouth daily as directed by coumadin clinic Patient taking differently: Take 3.75 mg by mouth at bedtime.  11/11/16  Yes Minus Breeding, MD  acetaminophen (TYLENOL) 500 MG tablet Take 500 mg by mouth every 6 (six) hours as needed.    [provider]  midodrine (PROAMATINE) 10 MG tablet Take 1 tablet (10 mg total) by mouth 2 (two) times daily with a meal. Patient not taking: Reported on 04/24/2017 02/10/17   Mariel Aloe, MD    Physical Exam: Vitals:   04/24/17 2300 04/24/17 2330 04/25/17 0000 04/25/17 0030  BP: (!) 130/54 121/77 (!) 124/49 126/62  Pulse: 61 60 (!) 56 62  Resp: (!) 23 18 11  (!) 24  Temp:      TempSrc:      SpO2: 100% 99% 96% 98%      Constitutional: NAD, calm, in apparent discomfort Eyes: PERTLA, lids and conjunctivae normal ENMT: Mucous membranes are moist. Posterior pharynx clear of any exudate or lesions.   Neck: normal, supple, no masses, no thyromegaly Respiratory: clear to auscultation bilaterally, no wheezing, no crackles. Normal respiratory effort.   Cardiovascular: S1 & S2 heard, regular rate and rhythm. No significant JVD. Abdomen: No distension, no tenderness, no masses palpated. Bowel sounds normal.  Musculoskeletal: no clubbing / cyanosis. No joint deformity upper and lower extremities.   Skin: no significant rashes, lesions, ulcers. Warm, dry, well-perfused. Neurologic: CN 2-12 grossly intact. Sensation intact. Strength 5/5 in all 4 limbs.  Psychiatric: Alert and oriented x 3. Pleasant. Cooperative.     Labs on Admission: I have personally reviewed following labs and imaging studies  CBC: Recent Labs  Lab 04/24/17 2011  WBC 5.2  HGB 13.0  HCT 39.8  MCV 98.5  PLT 992*   Basic Metabolic Panel: No results for input(s): NA, K, CL, CO2,  GLUCOSE, BUN, CREATININE, CALCIUM, MG, PHOS in the last 168 hours. GFR: CrCl cannot be calculated (Patient's most recent lab result is older than the maximum 21 days allowed.). Liver Function Tests: No results for input(s): AST, ALT, ALKPHOS, BILITOT, PROT, ALBUMIN in the last 168 hours. No results for input(s): LIPASE, AMYLASE in the last 168 hours. No results for input(s): AMMONIA in the last 168 hours. Coagulation Profile: Recent Labs  Lab 04/24/17 2011  INR 2.39   Cardiac Enzymes: No results for input(s): CKTOTAL, CKMB, CKMBINDEX, TROPONINI in the last 168 hours. BNP (last 3 results) No results for input(s): PROBNP in the last 8760 hours. HbA1C: No results for input(s): HGBA1C in the last 72 hours. CBG: Recent Labs  Lab 04/24/17 2015  GLUCAP 101*   Lipid Profile: No results for input(s): CHOL, HDL,  LDLCALC, TRIG, CHOLHDL, LDLDIRECT in the last 72 hours. Thyroid Function Tests: No results for input(s): TSH, T4TOTAL, FREET4, T3FREE, THYROIDAB in the last 72 hours. Anemia Panel: No results for input(s): VITAMINB12, FOLATE, FERRITIN, TIBC, IRON, RETICCTPCT in the last 72 hours. Urine analysis: No results found for: COLORURINE, APPEARANCEUR, LABSPEC, PHURINE, GLUCOSEU, HGBUR, BILIRUBINUR, KETONESUR, PROTEINUR, UROBILINOGEN, NITRITE, LEUKOCYTESUR Sepsis Labs: @LABRCNTIP (procalcitonin:4,lacticidven:4) )No results found for this or any previous visit (from the past 240 hour(s)).   Radiological Exams on Admission: Dg Chest Portable 1 View  Result Date: 04/24/2017 CLINICAL DATA:  Pt called out Thursday for hypotension was found to be orthostatic but refused to go to the hospital. Pt Called out today for syncope. EXAM: PORTABLE CHEST 1 VIEW COMPARISON:  Chest x-ray dated 02/09/2017. FINDINGS: Stable cardiomegaly. Aortic atherosclerosis. Chronic fibrosis and/or chronic bronchitic changes at the lung bases, stable. No confluent opacity to suggest a developing pneumonia. No pleural  effusion or pneumothorax seen. Osseous structures about the chest are unremarkable. IMPRESSION: 1. No active disease.  No evidence of pneumonia or pulmonary edema. 2. Stable cardiomegaly. 3. Chronic bronchitic changes and/or fibrosis at the lung bases. 4. Aortic atherosclerosis. Electronically Signed   By: Franki Cabot M.D.   On: 04/24/2017 21:00   Ct Angio Chest/abd/pel For Dissection W And/or Wo Contrast  Result Date: 04/24/2017 CLINICAL DATA:  Syncopal episode and fall. Mid abdominal and chest pain. Back injury. History of end-stage renal disease on dialysis, hypertension, RIGHT brachiocephalic pseudo aneurysm. EXAM: CT ANGIOGRAPHY CHEST, ABDOMEN AND PELVIS TECHNIQUE: Multidetector CT imaging through the chest, abdomen and pelvis was performed using the standard protocol during bolus administration of intravenous contrast. Multiplanar reconstructed images and MIPs were obtained and reviewed to evaluate the vascular anatomy. CONTRAST:  <See Chart> ISOVUE-370 IOPAMIDOL (ISOVUE-370) INJECTION 76% COMPARISON:  Chest radiograph April 24, 2017 at 2022 hours and CT abdomen and pelvis February 09, 2018 FINDINGS: CTA CHEST FINDINGS CARDIOVASCULAR: Thoracic aorta is normal course and caliber. Moderate to severe calcific atherosclerosis aortic arch. No intrinsic density on noncontrast CT. Homogeneous contrast opacification of thoracic aorta without dissection, aneurysm, luminal irregularity, periaortic fluid collections, or contrast extravasation. The heart is mildly enlarged. Moderate coronary artery calcifications. No pericardial effusion. Moderate stenosis brachiocephalic confluence with turbulent blood flow in heterogeneous contrast opacification. Though not tailored for evaluation. No central pulmonary emboli. MEDIASTINUM/NODES: No mediastinal mass or lymphadenopathy by CT size criteria. LUNGS/PLEURA: Tracheobronchial tree is patent, no pneumothorax. Bilateral lower lobe atelectasis. No pleural effusions, focal  consolidations, pulmonary nodules or masses. MUSCULOSKELETAL: Respiratory motion versus nondisplaced RIGHT posterolateral fourth rib fracture. Osteopenia. Large LEFT subacromial bursal collection. Small RIGHT subacromial bursal collection. Old sternal fracture. Severe degenerative change of thoracic spine. Review of the MIP images confirms the above findings. CTA ABDOMEN AND PELVIS FINDINGS VASCULAR Aorta: Abdominal aorta is normal course and caliber. , moderate calcific atherosclerosis. Homogeneous contrast opacification of aortoiliac vessels without dissection, aneurysm, luminal irregularity, periaortic fluid collections, or contrast extravasation. Celiac: Patent. SMA: Patent. Renals: Patent. Moderate stenosis RIGHT renal artery. Absent LEFT renal artery. IMA: Patent. Inflow: Negative. Veins: Negative, not tailored for evaluation. Review of the MIP images confirms the above findings. NON-VASCULAR HEPATOBILIARY: Re- demonstration of multiple hepatic, and scattered hepatic calcifications. Normal gallbladder. PANCREAS: Normal. SPLEEN: Normal. ADRENALS/URINARY TRACT: Absent RIGHT kidney. Extremely atrophic RIGHT kidney with 15 mm cyst. Urinary bladder is decompressed. STOMACH/BOWEL: Thickened, irregular gastric antrum. Mildly hyperemic proximal duodenum. The small and large bowel are normal in course and caliber without inflammatory changes, sensitivity decreased without oral contrast. Proximal  small bowel lipoma, unchanged. Severe descending and sigmoid colonic diverticulosis. Additional scattered colonic diverticula. Moderate amount of retained large bowel stool. VASCULAR/LYMPHATIC: No lymphadenopathy by CT size criteria. REPRODUCTIVE: Normal. OTHER: No intraperitoneal free fluid or free air. MUSCULOSKELETAL: Nonacute. Stable acetabular subchondral cysts. Osteopenia. Severe degenerative change of lumbar spine with grade 1 L4-5 anterolisthesis on degenerative basis. Mild broad lumbar levoscoliosis. Review of the MIP  images confirms the above findings. IMPRESSION: CTA CHEST: 1. No acute vascular process. 2. Moderate cardiomegaly.  Bilateral atelectasis. 3. Acute nondisplaced RIGHT fourth rib fracture versus respiratory motion artifact. CTA ABDOMEN AND PELVIS: 1. No acute vascular process. 2. Thickened irregular gastric antrum most consistent with gastritis new from October 2018. 3. Atrophic RIGHT kidney and absent LEFT kidney. Aortic Atherosclerosis (ICD10-I70.0). Electronically Signed   By: Elon Alas M.D.   On: 04/24/2017 22:43    EKG: Independently reviewed. Sinus rhythm, PVC's, RBBB.   Assessment/Plan  1. Syncope  - Pt presents following an apparent syncopal event at home, preceded by upper GI discomfort  - She denies hitting her head, denies focal numbness or weakness, and no focal neurologic deficits identified  - Possibly neurally-mediated in light of preceding GI-sxs; possibly related to recent problems with hypotension  - EKG with sinus rhythm and RBBB, similar to priors; BP stable in ED; orthostatic vitals negative  - Plan to continue cardiac monitoring, check echocardiogram    2. ESRD  - Pt reports completing HD on 12/15 without incident  - No acidosis, hyperkalemia, HTN, or volume issues  - Will ask nephrology to see tomorrow for routine HD   - SLIV, fluid-restrict diet   3. Paroxysmal atrial fibrillation  - In a sinus rhythm on admission - CHADS-VASc is at least 80 (age x2, gender)  - INR is 2.39 on admission  - Continue coumadin and amiodarone   4. Gastritis  - Pt reports upper abdominal discomfort, noted to have thickening of gastric antrum on CT  - Start Pepcid  5. Rib fracture  - Pt complains of severe pain in ED, found to have acute non-displaced right 4th rib fracture  - Continue prn analgesia, start incentive spirometry    DVT prophylaxis: warfarin Code Status: Full  Family Communication: Discussed with patient Disposition Plan: Observe on telemetry Consults  called: None Admission status: Observation    Vianne Bulls, MD [facility] Pager [phone]  If 7PM-7AM, please contact night-coverage [email or web address] Password [email or web address]  04/25/2017, 12:45 AM

## 2017-04-25 NOTE — Consult Note (Addendum)
Cardiology Consultation:   Patient ID: [name]; [id number]; June 28, 1921   Admit date: 04/24/2017 Date of Consult: 04/25/2017  Primary Care Provider: Levin Erp, MD Primary Cardiologist: Minus Breeding, MD   Patient Profile:   [name] is a 83 y.o. female with a hx of severe AS (not interested in [facility]), PAF on Coumadin, HLD, TIA, OSA, ESRD on HD, RBBB, prior hypotension requiring midodrine  Bradycardia on BB and HTN who is being seen today for the evaluation of Syncope at the request of Dr. Tyrell Antonio.   Last echo 11/2015 showed LVEF of 60-65%, grade 2 DD, severe AS Peak gradient of 67 mm Hg and mild AI. Severe pulmonary HTN.   Last seen by Dr. Percival Spanish 03/17/17. She was maintaining sinus rhythm. Per note, no indication for the Midodrine. However, home meds listed it.   History of Present Illness:   [name] presented by EMS with syncope last night. The patient was complained of abdominal discomfort and went to use bathroom and family heard her falling. LOC for few seconds per note. No family at bedside. The patient is somewhat lethargic after multiple dose of Fentanyl. Complains of back pain. Only thinf she remembers to using bathroom and then standing up. Denies dizziness or palpitations.   EKG showed sinus rhythm with chronic RBBB and PVCs- personally reviewed. CXR without acute cardiopulmonary disease. CTA of abdomen pelvis showed acute nondisplaced right fourth rib fracture, as well as thickened gastric antrum, likely reflecting gastritis.  Noted bradycardic at upper 50s with normotensive. Scr ~7. INR 2.73. Pending echo this admission.  Past Medical History:  Diagnosis Date  . Anemia   . Aortic stenosis   . Benign bladder mass   . Chronic cough   . DJD (degenerative joint disease)   . Dyslipidemia   . ESRD on hemodialysis ([place])    "TTS; [facility]" (09/27/2016)  . Gout   . HTN (hypertension)   . Obstructive sleep apnea    mild  . Orthostatic syncope    around year 2006  . Pneumonia   . TIA (transient ischemic attack)     Past Surgical History:  Procedure Laterality Date  . BONE MARROW BIOPSY    . CATARACT EXTRACTION W/ INTRAOCULAR LENS  IMPLANT, BILATERAL    . COLONOSCOPY W/ BIOPSIES AND POLYPECTOMY    . left arm dialysis graft    . NEPHRECTOMY     left  . REVISION OF ARTERIOVENOUS GORETEX GRAFT Right 11/24/2014   Procedure: REPLACEMENT OF  MEDIAL SIDE OF RIGHT FORARM ARTERIOVENOUS GORETEX GRAFT;  Surgeon: Conrad Inglewood, MD;  Location: [facility];  Service: Vascular;  Laterality: Right;  . REVISION OF ARTERIOVENOUS GORETEX GRAFT Right 11/28/2015   Procedure: REVISION OF RIGHT ARM ARTERIOVENOUS GORETEX GRAFT;  Surgeon: Serafina Mitchell, MD;  Location: [facility];  Service: Vascular;  Laterality: Right;  . TOTAL ABDOMINAL HYSTERECTOMY       Inpatient Medications: Scheduled Meds: . amiodarone  100 mg Oral QHS  . cinacalcet  30 mg Oral Q breakfast  . doxercalciferol  2 mcg Intravenous Q T,Th,Sa-HD  . feeding supplement (NEPRO CARB STEADY)  237 mL Oral BID BM  . multivitamin  1 tablet Oral Daily  . sodium chloride flush  3 mL Intravenous Q12H  . sodium chloride flush  3 mL Intravenous Q12H  . warfarin  3.75 mg Oral ONCE-1800  . Warfarin - Pharmacist Dosing Inpatient   Does not apply q1800   Continuous Infusions: . sodium chloride    .  famotidine (PEPCID) IV     PRN Meds: sodium chloride, acetaminophen **OR** acetaminophen, bisacodyl, HYDROmorphone (DILAUDID) injection, ondansetron **OR** ondansetron (ZOFRAN) IV, oxyCODONE, senna-docusate, sodium chloride flush  Allergies:    Allergies  Allergen Reactions  . Tape Other (See Comments)    Pulls off skin  . Sulfa Antibiotics Other (See Comments)    Bumps all over body    Social History:   Social History   Socioeconomic History  . Marital status: Widowed    Spouse name: Not on file  . Number of children: Not on file  . Years of education: Not on file  . Highest education level: Not  on file  Social Needs  . Financial resource strain: Not on file  . Food insecurity - worry: Not on file  . Food insecurity - inability: Not on file  . Transportation needs - medical: Not on file  . Transportation needs - non-medical: Not on file  Occupational History  . Occupation: Retired    Comment: Housekeeper  Tobacco Use  . Smoking status: Never Smoker  . Smokeless tobacco: Never Used  Substance and Sexual Activity  . Alcohol use: No    Alcohol/week: 0.0 oz  . Drug use: No  . Sexual activity: No  Other Topics Concern  . Not on file  Social History Narrative  . Not on file    Family History:    Family History  Problem Relation Age of Onset  . Hypertension Mother   . Diabetes Mother   . Hypertension Other      ROS:  Please see the history of present illness.  ROS All other ROS reviewed and negative.     Physical Exam/Data:   Vitals:   04/25/17 0745 04/25/17 0800 04/25/17 0845 04/25/17 0900  BP:  (!) 125/45  (!) 128/55  Pulse: (!) 57 (!) 55 (!) 50 63  Resp: _0 (!) 21  Temp:      TempSrc:      SpO2: 100% 98% 98% 98%    General: Thin frail elderly female in no acute distress however complains of back pain  HEENT: normal Lymph: no adenopathy Neck: no JVD Endocrine:  No thryomegaly Vascular: No carotid bruits; FA pulses 2+ bilaterally without bruits  Cardiac:  normal S1, S2; RRR; 3-6 systolic  murmur  Lungs:  clear to auscultation bilaterally, no wheezing, rhonchi or rales  Abd: soft, nontender, no hepatomegaly  Ext: no edema Musculoskeletal:  No deformities, BUE and BLE strength normal and equal Skin: warm and dry  Neuro:  No focal abnormalities noted. Somewhat leghtergic Psych:  Normal affect   Telemetry:  Telemetry was personally reviewed and demonstrates:  Sinus Bradycardia at upper 50s  Relevant CV Studies: Echo 11/29/15 Study Conclusions  - Left ventricle: The cavity size was normal. There was mild focal   basal hypertrophy of the  septum. Systolic function was normal.   The estimated ejection fraction was in the range of 60% to 65%.   Wall motion was normal; there were no regional wall motion   abnormalities. Features are consistent with a pseudonormal left   ventricular filling pattern, with concomitant abnormal relaxation   and increased filling pressure (grade 2 diastolic dysfunction). - Aortic valve: Trileaflet; moderately thickened, moderately   calcified leaflets. There was severe stenosis. There was mild   regurgitation. Mean gradient (S): 35 mm Hg. Peak gradient (S): 67   mm Hg. VTI ratio of LVOT to aortic valve: 0.31. Valve area (VTI):   1.06 cm^2.  Valve area (Vmax): 1.01 cm^2. Valve area (Vmean): 0.99   cm^2. - Mitral valve: Calcified annulus. Mildly calcified leaflets .   There was trivial regurgitation. - Left atrium: The atrium was mildly to moderately dilated. - Right atrium: Central venous pressure (est): 8 mm Hg. - Atrial septum: No defect or patent foramen ovale was identified. - Tricuspid valve: There was mild regurgitation. - Pulmonary arteries: Systolic pressure was severely increased. PA   peak pressure: 62 mm Hg (S). - Pericardium, extracardiac: A trivial pericardial effusion was   identified posterior to the heart.  Impressions:  - Mild basal septal LV hypertrophy with LVEF 60-65%. Grade 2   diastolic dysfunction with increased LV filling pressure. Mild to   moderate left atrial enlargement. MAC with mildly calcified   mitral leaflets and trivial mitral regurgitation. Severe calcific   aortic stenosis as outlined above with mild aortic regurgitation.   Mild tricuspid regurgitation with evidence of severe pulmonary   hypertension, PA S/P estimated 62 mmHg. Trivial posterior   pericardial effusion. No definitive valvular vegetations noted.  Laboratory Data:  Chemistry Recent Labs  Lab 04/25/17 0045 04/25/17 0100 04/25/17 0307  NA 133* 132* 133*  K 5.1 5.0 5.4*  CL 92* 94* 94*    CO2 29  --  27  GLUCOSE 100* 99 91  BUN 24* 26* 25*  CREATININE 6.70* 7.10* 7.01*  CALCIUM 9.0  --  9.0  GFRNONAA 5*  --  4*  GFRAA 5*  --  5*  ANIONGAP 12  --  12    Recent Labs  Lab 04/25/17 0045  PROT 5.8*  ALBUMIN 3.4*  AST 22  ALT 12*  ALKPHOS 59  BILITOT 0.7   Hematology Recent Labs  Lab 04/24/17 2011 04/25/17 0100 04/25/17 0307  WBC 5.2  --  4.9  RBC 4.04  --  3.22*  HGB 13.0 11.2* 10.4*  HCT 39.8 33.0* 32.0*  MCV 98.5  --  99.4  MCH 32.2  --  32.3  MCHC 32.7  --  32.5  RDW 14.3  --  14.5  PLT 126*  --   --    Cardiac EnzymesNo results for input(s): TROPONINI in the last 168 hours.  Recent Labs  Lab 04/24/17 2022  TROPIPOC 0.02    BNPNo results for input(s): BNP, PROBNP in the last 168 hours.  DDimer No results for input(s): DDIMER in the last 168 hours.  Radiology/Studies:  Dg Chest Portable 1 View  Result Date: 04/24/2017 CLINICAL DATA:  Pt called out Thursday for hypotension was found to be orthostatic but refused to go to the hospital. Pt Called out today for syncope. EXAM: PORTABLE CHEST 1 VIEW COMPARISON:  Chest x-ray dated 02/09/2017. FINDINGS: Stable cardiomegaly. Aortic atherosclerosis. Chronic fibrosis and/or chronic bronchitic changes at the lung bases, stable. No confluent opacity to suggest a developing pneumonia. No pleural effusion or pneumothorax seen. Osseous structures about the chest are unremarkable. IMPRESSION: 1. No active disease.  No evidence of pneumonia or pulmonary edema. 2. Stable cardiomegaly. 3. Chronic bronchitic changes and/or fibrosis at the lung bases. 4. Aortic atherosclerosis. Electronically Signed   By: Franki Cabot M.D.   On: 04/24/2017 21:00   Ct Angio Chest/abd/pel For Dissection W And/or Wo Contrast  Result Date: 04/24/2017 CLINICAL DATA:  Syncopal episode and fall. Mid abdominal and chest pain. Back injury. History of end-stage renal disease on dialysis, hypertension, RIGHT brachiocephalic pseudo aneurysm. EXAM:  CT ANGIOGRAPHY CHEST, ABDOMEN AND PELVIS TECHNIQUE: Multidetector CT imaging through the chest,  abdomen and pelvis was performed using the standard protocol during bolus administration of intravenous contrast. Multiplanar reconstructed images and MIPs were obtained and reviewed to evaluate the vascular anatomy. CONTRAST:  <See Chart> ISOVUE-370 IOPAMIDOL (ISOVUE-370) INJECTION 76% COMPARISON:  Chest radiograph April 24, 2017 at 2022 hours and CT abdomen and pelvis February 09, 2018 FINDINGS: CTA CHEST FINDINGS CARDIOVASCULAR: Thoracic aorta is normal course and caliber. Moderate to severe calcific atherosclerosis aortic arch. No intrinsic density on noncontrast CT. Homogeneous contrast opacification of thoracic aorta without dissection, aneurysm, luminal irregularity, periaortic fluid collections, or contrast extravasation. The heart is mildly enlarged. Moderate coronary artery calcifications. No pericardial effusion. Moderate stenosis brachiocephalic confluence with turbulent blood flow in heterogeneous contrast opacification. Though not tailored for evaluation. No central pulmonary emboli. MEDIASTINUM/NODES: No mediastinal mass or lymphadenopathy by CT size criteria. LUNGS/PLEURA: Tracheobronchial tree is patent, no pneumothorax. Bilateral lower lobe atelectasis. No pleural effusions, focal consolidations, pulmonary nodules or masses. MUSCULOSKELETAL: Respiratory motion versus nondisplaced RIGHT posterolateral fourth rib fracture. Osteopenia. Large LEFT subacromial bursal collection. Small RIGHT subacromial bursal collection. Old sternal fracture. Severe degenerative change of thoracic spine. Review of the MIP images confirms the above findings. CTA ABDOMEN AND PELVIS FINDINGS VASCULAR Aorta: Abdominal aorta is normal course and caliber. , moderate calcific atherosclerosis. Homogeneous contrast opacification of aortoiliac vessels without dissection, aneurysm, luminal irregularity, periaortic fluid collections, or  contrast extravasation. Celiac: Patent. SMA: Patent. Renals: Patent. Moderate stenosis RIGHT renal artery. Absent LEFT renal artery. IMA: Patent. Inflow: Negative. Veins: Negative, not tailored for evaluation. Review of the MIP images confirms the above findings. NON-VASCULAR HEPATOBILIARY: Re- demonstration of multiple hepatic, and scattered hepatic calcifications. Normal gallbladder. PANCREAS: Normal. SPLEEN: Normal. ADRENALS/URINARY TRACT: Absent RIGHT kidney. Extremely atrophic RIGHT kidney with 15 mm cyst. Urinary bladder is decompressed. STOMACH/BOWEL: Thickened, irregular gastric antrum. Mildly hyperemic proximal duodenum. The small and large bowel are normal in course and caliber without inflammatory changes, sensitivity decreased without oral contrast. Proximal small bowel lipoma, unchanged. Severe descending and sigmoid colonic diverticulosis. Additional scattered colonic diverticula. Moderate amount of retained large bowel stool. VASCULAR/LYMPHATIC: No lymphadenopathy by CT size criteria. REPRODUCTIVE: Normal. OTHER: No intraperitoneal free fluid or free air. MUSCULOSKELETAL: Nonacute. Stable acetabular subchondral cysts. Osteopenia. Severe degenerative change of lumbar spine with grade 1 L4-5 anterolisthesis on degenerative basis. Mild broad lumbar levoscoliosis. Review of the MIP images confirms the above findings. IMPRESSION: CTA CHEST: 1. No acute vascular process. 2. Moderate cardiomegaly.  Bilateral atelectasis. 3. Acute nondisplaced RIGHT fourth rib fracture versus respiratory motion artifact. CTA ABDOMEN AND PELVIS: 1. No acute vascular process. 2. Thickened irregular gastric antrum most consistent with gastritis new from October 2018. 3. Atrophic RIGHT kidney and absent LEFT kidney. Aortic Atherosclerosis (ICD10-I70.0). Electronically Signed   By: Elon Alas M.D.   On: 04/24/2017 22:43    Assessment and Plan:   1. Syncope - Only thing she remembers to using bathroom and then  standing. Differential inculdes orthostatic hypotension, vasovagal or arrhythmias. Blood sugar was normal by EMS. Pending CT of head. Check orthostatic.   2. PAF - Maintaining sinus rhythm.  Rate in upper 50s. Not on any AV blocking agent.  - Continue coumadin per pharmacy. CHADSVASC score of 6.  3. Severe AS - AS noted per echo above. Per note she is not interested in aggressive therapy like TAVR. - Pending echo this admission  4. HTN - Prior severe hypotension requring midodrine. Normotensive here.    5. ESRD on HD - Per primary tem    For questions  or updates, please contact [place] Please consult [email or web address] for contact info under Cardiology/STEMI.   Jarrett Soho, PA  04/25/2017 9:34 AM  As above, patient seen and examined.  Briefly she is a 83 year old female with past medical history of severe aortic stenosis, paroxysmal atrial fibrillation on chronic Coumadin,   End-stage renal disease dialysis dependent, prior TIA, hyperlipidemia, obstructive sleep apnea who I am asked to evaluate for syncope.  Patient has a history of severe aortic stenosis but she does not want aggressive therapies such as TAVR.  She went to the bathroom yesterday to try and have a bowel movement.  She became nauseated and then stood to leave the bathroom and had a syncopal episode.  No preceding dyspnea, chest pain, palpitations.  Cardiology now asked to evaluate.  Chest CT shows right rib fracture.  Electrocardiogram shows sinus rhythm with PVC, borderline first-degree AV block and right bundle branch block.   1 syncope-based on description symptoms sound potentially to be vagally mediated or orthostatic.  She was sitting on the commode, developed nausea and then stood followed by syncope.  Follow on telemetry for 24 hours.  If no significant arrhythmia she can be discharged.  She should not drive for 6 months.  Primary care has ordered an echocardiogram to assess LV function and aortic  stenosis.  However she makes it clear that she does not want aggressive measures such as TAVR.  2 history of severe aortic stenosis-as above patient does not want aggressive measures.  3 history of paroxysmal atrial fibrillation-patient in sinus rhythm.  Continue amiodarone and Coumadin.  If she has more frequent falls in the future would need to consider risk-benefit ratio of Coumadin.  4 end-stage renal disease-we will discuss with nephrology as she will need dialysis while here.  Kirk Ruths, MD

## 2017-04-25 NOTE — ED Notes (Signed)
Pt placed in hospital bed for comfort.

## 2017-04-25 NOTE — ED Notes (Signed)
Admitting at bedside 

## 2017-04-25 NOTE — Progress Notes (Signed)
ANTICOAGULATION CONSULT NOTE - Initial Consult  Pharmacy Consult for Coumadin Indication: atrial fibrillation  Allergies  Allergen Reactions  . Tape Other (See Comments)    Pulls off skin  . Sulfa Antibiotics Other (See Comments)    Bumps all over body    Vital Signs: Temp: 98 F (36.7 C) (12/17 1936) Temp Source: Oral (12/17 1936) BP: 126/62 (12/18 0030) Pulse Rate: 62 (12/18 0030)  Labs: Recent Labs    04/24/17 2011 04/25/17 0100  HGB 13.0 11.2*  HCT 39.8 33.0*  PLT 126*  --   LABPROT 25.9*  --   INR 2.39  --   CREATININE  --  7.10*    Medical History: Past Medical History:  Diagnosis Date  . Anemia   . Aortic stenosis   . Benign bladder mass   . Chronic cough   . DJD (degenerative joint disease)   . Dyslipidemia   . ESRD on hemodialysis ([place])    "TTS; [facility]" (09/27/2016)  . Gout   . HTN (hypertension)   . Obstructive sleep apnea    mild  . Orthostatic syncope    around year 2006  . Pneumonia   . TIA (transient ischemic attack)     Assessment: 83yo female presents w/ LOC, CP, and abdominal pain, admitted for further w/u, to continue Coumadin for Afib; current INR at goal with last dose of Coumadin taken 12/16.  Goal of Therapy:  INR 2-3   Plan:  Will continue home Coumadin dose of 3.75mg  daily and monitor INR.  Wynona Neat, PharmD, BCPS  04/25/2017,1:12 AM

## 2017-04-25 NOTE — Consult Note (Signed)
[facility] Renal Consultation Note    Indication for Consultation:  Management of ESRD/hemodialysis; anemia, hypertension/volume and secondary hyperparathyroidism PCP: Grayland Ormond, MD  HPI: [name] is a 83 y.o. female with ESRD on TTS HD at [facility] for 12 years , hx PAF, PVD, TIA, HTN , AS, who presented early  this am with syncope.  EMS had been called to here home 12/13 for evaluation of hypotension but she refused transport to the hospital. Patient said she had driven herself to the grocery store post HD 12/13 and felt bad after she got home. She last dialyzed 12/15 with a net UF of 2 L post wt 57.2 (EDW 57.5) post HD sitting BP was 143/63 and standing was 137/56 P 76.  EMS was called again today due to apparent syncopal episode when the patient was in the bathroom.  She regained consciousness within seconds and denied hitting her head. She did have pain in her back.  She remember sitting on the toitlet then going to get up and recalls waking up on the floor with back pain. She has been having some abdominal pain at times that she thinks is associated with low BP. She feels like her back hurts more than her ribs. She is not SOB. It hurts to try to sit up in bed. She has been driving herself to and from HD.  her daughter lives with her and is more than  willing to drive her mother to dialysis but the patient has wished to drive herself.  Evaluation in the ED showed good sats, no acute CXR findings, Head CT neg acute findings. EGK stable SR with PVCs and RBBB, CTA angio chest/abd/pelvis shoeed acute nondisplaced right 4th rib fx and thickened gastric antrum which was likely gastritis.  She has been treated with IV and oral pain meds in the ED with continued pain. K is 5.4 hgb stable. She is due for dialysis today  Past Medical History:  Diagnosis Date  . Anemia   . Aortic stenosis   . Benign bladder mass   . Chronic cough   . DJD (degenerative joint disease)   . Dyslipidemia    . ESRD on hemodialysis ([place])    "TTS; [place]" (09/27/2016)  . Gout   . HTN (hypertension)   . Obstructive sleep apnea    mild  . Orthostatic syncope    around year 2006  . Pneumonia   . TIA (transient ischemic attack)    Past Surgical History:  Procedure Laterality Date  . BONE MARROW BIOPSY    . CATARACT EXTRACTION W/ INTRAOCULAR LENS  IMPLANT, BILATERAL    . COLONOSCOPY W/ BIOPSIES AND POLYPECTOMY    . left arm dialysis graft    . NEPHRECTOMY     left  . REVISION OF ARTERIOVENOUS GORETEX GRAFT Right 11/24/2014   Procedure: REPLACEMENT OF  MEDIAL SIDE OF RIGHT FORARM ARTERIOVENOUS GORETEX GRAFT;  Surgeon: Conrad Tyndall, MD;  Location: [facility];  Service: Vascular;  Laterality: Right;  . REVISION OF ARTERIOVENOUS GORETEX GRAFT Right 11/28/2015   Procedure: REVISION OF RIGHT ARM ARTERIOVENOUS GORETEX GRAFT;  Surgeon: Serafina Mitchell, MD;  Location: [place];  Service: Vascular;  Laterality: Right;  . TOTAL ABDOMINAL HYSTERECTOMY     Family History  Problem Relation Age of Onset  . Hypertension Mother   . Diabetes Mother   . Hypertension Other    Social History:  reports that  has never smoked. she has never used smokeless tobacco. She reports  that she does not drink alcohol or use drugs. Allergies  Allergen Reactions  . Tape Other (See Comments)    Pulls off skin  . Sulfa Antibiotics Other (See Comments)    Bumps all over body   Prior to Admission medications   Medication Sig Start Date End Date Taking? Authorizing Provider  amiodarone (PACERONE) 200 MG tablet Take 0.5 tablets (100 mg total) by mouth daily. Patient taking differently: Take 100 mg by mouth at bedtime.  12/14/16  Yes Barrett, Evelene Croon, PA-C  multivitamin (RENA-VIT) TABS tablet Take 1 tablet by mouth daily.   Yes [provider]  Nutritional Supplements (FEEDING SUPPLEMENT, NEPRO CARB STEADY,) LIQD Take 237 mLs by mouth 2 (two) times daily between meals. 02/10/17  Yes Mariel Aloe, MD  SENSIPAR 30 MG  tablet Take 30 mg by mouth at bedtime.  02/14/13  Yes [provider]  warfarin (COUMADIN) 2.5 MG tablet Take 1 to 1.5 tablets by mouth daily as directed by coumadin clinic Patient taking differently: Take 3.75 mg by mouth at bedtime.  11/11/16  Yes Minus Breeding, MD  acetaminophen (TYLENOL) 500 MG tablet Take 500 mg by mouth every 6 (six) hours as needed.    [provider]  midodrine (PROAMATINE) 10 MG tablet Take 1 tablet (10 mg total) by mouth 2 (two) times daily with a meal. Patient not taking: Reported on 04/24/2017 02/10/17   Mariel Aloe, MD   Current Facility-Administered Medications  Medication Dose Route Frequency Provider Last Rate Last Dose  . 0.9 %  sodium chloride infusion  250 mL Intravenous PRN Opyd, Ilene Qua, MD      . acetaminophen (TYLENOL) tablet 650 mg  650 mg Oral Q6H PRN Opyd, Ilene Qua, MD       Or  . acetaminophen (TYLENOL) suppository 650 mg  650 mg Rectal Q6H PRN Opyd, Ilene Qua, MD      . amiodarone (PACERONE) tablet 100 mg  100 mg Oral QHS Opyd, Ilene Qua, MD   100 mg at 04/25/17 0744  . bisacodyl (DULCOLAX) EC tablet 5 mg  5 mg Oral Daily PRN Opyd, Ilene Qua, MD      . cinacalcet (SENSIPAR) tablet 30 mg  30 mg Oral Q breakfast Opyd, Ilene Qua, MD   30 mg at 04/25/17 0745  . doxercalciferol (HECTOROL) injection 2 mcg  2 mcg Intravenous Q T,Th,Sa-HD Alric Seton, PA-C      . famotidine (PEPCID) IVPB 20 mg premix  20 mg Intravenous Q24H Opyd, Ilene Qua, MD   Stopped at 04/25/17 1040  . feeding supplement (NEPRO CARB STEADY) liquid 237 mL  237 mL Oral BID BM Opyd, Timothy S, MD      . HYDROmorphone (DILAUDID) injection 0.5-1 mg  0.5-1 mg Intravenous Q3H PRN Opyd, Ilene Qua, MD      . multivitamin (RENA-VIT) tablet 1 tablet  1 tablet Oral Daily Opyd, Ilene Qua, MD   1 tablet at 04/25/17 1226  . ondansetron (ZOFRAN) tablet 4 mg  4 mg Oral Q6H PRN Opyd, Ilene Qua, MD       Or  . ondansetron (ZOFRAN) injection 4 mg  4 mg Intravenous Q6H PRN Opyd,  Ilene Qua, MD      . oxyCODONE (Oxy IR/ROXICODONE) immediate release tablet 5 mg  5 mg Oral Q4H PRN Opyd, Ilene Qua, MD      . senna-docusate (Senokot-S) tablet 1 tablet  1 tablet Oral QHS PRN Opyd, Ilene Qua, MD      .  sodium chloride flush (NS) 0.9 % injection 3 mL  3 mL Intravenous Q12H Opyd, Timothy S, MD      . sodium chloride flush (NS) 0.9 % injection 3 mL  3 mL Intravenous Q12H Opyd, Timothy S, MD      . sodium chloride flush (NS) 0.9 % injection 3 mL  3 mL Intravenous PRN Opyd, Ilene Qua, MD      . warfarin (COUMADIN) tablet 3.75 mg  3.75 mg Oral ONCE-1800 Regalado, Belkys A, MD      . Warfarin - Pharmacist Dosing Inpatient   Does not apply q1800 Laren Everts, [facility]       Current Outpatient Medications  Medication Sig Dispense Refill  . amiodarone (PACERONE) 200 MG tablet Take 0.5 tablets (100 mg total) by mouth daily. (Patient taking differently: Take 100 mg by mouth at bedtime. ) 90 tablet 1  . multivitamin (RENA-VIT) TABS tablet Take 1 tablet by mouth daily.    . Nutritional Supplements (FEEDING SUPPLEMENT, NEPRO CARB STEADY,) LIQD Take 237 mLs by mouth 2 (two) times daily between meals. 60 Can 0  . SENSIPAR 30 MG tablet Take 30 mg by mouth at bedtime.     Marland Kitchen warfarin (COUMADIN) 2.5 MG tablet Take 1 to 1.5 tablets by mouth daily as directed by coumadin clinic (Patient taking differently: Take 3.75 mg by mouth at bedtime. ) 45 tablet 3  . acetaminophen (TYLENOL) 500 MG tablet Take 500 mg by mouth every 6 (six) hours as needed.    . midodrine (PROAMATINE) 10 MG tablet Take 1 tablet (10 mg total) by mouth 2 (two) times daily with a meal. (Patient not taking: Reported on 04/24/2017) 60 tablet 0   Labs: Basic Metabolic Panel: Recent Labs  Lab 04/25/17 0045 04/25/17 0100 04/25/17 0307  NA 133* 132* 133*  K 5.1 5.0 5.4*  CL 92* 94* 94*  CO2 29  --  27  GLUCOSE 100* 99 91  BUN 24* 26* 25*  CREATININE 6.70* 7.10* 7.01*  CALCIUM 9.0  --  9.0   Liver Function Tests: Recent Labs   Lab 04/25/17 0045  AST 22  ALT 12*  ALKPHOS 59  BILITOT 0.7  PROT 5.8*  ALBUMIN 3.4*   Lab Results  Component Value Date   INR 2.73 04/25/2017   INR 2.39 04/24/2017   INR 2.3 (A) 04/06/2017    CBC: Recent Labs  Lab 04/24/17 2011 04/25/17 0100 04/25/17 0307  WBC 5.2  --  4.9  HGB 13.0 11.2* 10.4*  HCT 39.8 33.0* 32.0*  MCV 98.5  --  99.4  PLT 126*  --   --    Cardiac Enzymes: Recent Labs  Lab 04/25/17 0956  TROPONINI <0.03   CBG: Recent Labs  Lab 04/24/17 2015 04/25/17 0729  GLUCAP 101* 83   Iron Studies: No results for input(s): IRON, TIBC, TRANSFERRIN, FERRITIN in the last 72 hours. Studies/Results: Ct Head Wo Contrast  Result Date: 04/25/2017 CLINICAL DATA:  Syncopal episode last night. Denies hitting head. Next medial 10/06/2010 EXAM: CT HEAD WITHOUT CONTRAST TECHNIQUE: Contiguous axial images were obtained from the base of the skull through the vertex without intravenous contrast. COMPARISON:  None. FINDINGS: Brain: New new Generalized cerebral atrophy. Periventricular white matter low attenuation likely secondary to microangiopathy. Vascular: Cerebrovascular atherosclerotic calcifications are noted. Skull: Negative for fracture or focal lesion. Sinuses/Orbits: Visualized portions of the orbits are unremarkable. Visualized portions of the paranasal sinuses and mastoid air cells are unremarkable. Other: None. IMPRESSION: 1. No acute intracranial  pathology. 2. Chronic microvascular disease and cerebral atrophy. Electronically Signed   By: Kathreen Devoid   On: 04/25/2017 09:51   Dg Chest Portable 1 View  Result Date: 04/24/2017 CLINICAL DATA:  Pt called out Thursday for hypotension was found to be orthostatic but refused to go to the hospital. Pt Called out today for syncope. EXAM: PORTABLE CHEST 1 VIEW COMPARISON:  Chest x-ray dated 02/09/2017. FINDINGS: Stable cardiomegaly. Aortic atherosclerosis. Chronic fibrosis and/or chronic bronchitic changes at the lung  bases, stable. No confluent opacity to suggest a developing pneumonia. No pleural effusion or pneumothorax seen. Osseous structures about the chest are unremarkable. IMPRESSION: 1. No active disease.  No evidence of pneumonia or pulmonary edema. 2. Stable cardiomegaly. 3. Chronic bronchitic changes and/or fibrosis at the lung bases. 4. Aortic atherosclerosis. Electronically Signed   By: Franki Cabot M.D.   On: 04/24/2017 21:00   Ct Angio Chest/abd/pel For Dissection W And/or Wo Contrast  Result Date: 04/24/2017 CLINICAL DATA:  Syncopal episode and fall. Mid abdominal and chest pain. Back injury. History of end-stage renal disease on dialysis, hypertension, RIGHT brachiocephalic pseudo aneurysm. EXAM: CT ANGIOGRAPHY CHEST, ABDOMEN AND PELVIS TECHNIQUE: Multidetector CT imaging through the chest, abdomen and pelvis was performed using the standard protocol during bolus administration of intravenous contrast. Multiplanar reconstructed images and MIPs were obtained and reviewed to evaluate the vascular anatomy. CONTRAST:  <See Chart> ISOVUE-370 IOPAMIDOL (ISOVUE-370) INJECTION 76% COMPARISON:  Chest radiograph April 24, 2017 at 2022 hours and CT abdomen and pelvis February 09, 2018 FINDINGS: CTA CHEST FINDINGS CARDIOVASCULAR: Thoracic aorta is normal course and caliber. Moderate to severe calcific atherosclerosis aortic arch. No intrinsic density on noncontrast CT. Homogeneous contrast opacification of thoracic aorta without dissection, aneurysm, luminal irregularity, periaortic fluid collections, or contrast extravasation. The heart is mildly enlarged. Moderate coronary artery calcifications. No pericardial effusion. Moderate stenosis brachiocephalic confluence with turbulent blood flow in heterogeneous contrast opacification. Though not tailored for evaluation. No central pulmonary emboli. MEDIASTINUM/NODES: No mediastinal mass or lymphadenopathy by CT size criteria. LUNGS/PLEURA: Tracheobronchial tree is  patent, no pneumothorax. Bilateral lower lobe atelectasis. No pleural effusions, focal consolidations, pulmonary nodules or masses. MUSCULOSKELETAL: Respiratory motion versus nondisplaced RIGHT posterolateral fourth rib fracture. Osteopenia. Large LEFT subacromial bursal collection. Small RIGHT subacromial bursal collection. Old sternal fracture. Severe degenerative change of thoracic spine. Review of the MIP images confirms the above findings. CTA ABDOMEN AND PELVIS FINDINGS VASCULAR Aorta: Abdominal aorta is normal course and caliber. , moderate calcific atherosclerosis. Homogeneous contrast opacification of aortoiliac vessels without dissection, aneurysm, luminal irregularity, periaortic fluid collections, or contrast extravasation. Celiac: Patent. SMA: Patent. Renals: Patent. Moderate stenosis RIGHT renal artery. Absent LEFT renal artery. IMA: Patent. Inflow: Negative. Veins: Negative, not tailored for evaluation. Review of the MIP images confirms the above findings. NON-VASCULAR HEPATOBILIARY: Re- demonstration of multiple hepatic, and scattered hepatic calcifications. Normal gallbladder. PANCREAS: Normal. SPLEEN: Normal. ADRENALS/URINARY TRACT: Absent RIGHT kidney. Extremely atrophic RIGHT kidney with 15 mm cyst. Urinary bladder is decompressed. STOMACH/BOWEL: Thickened, irregular gastric antrum. Mildly hyperemic proximal duodenum. The small and large bowel are normal in course and caliber without inflammatory changes, sensitivity decreased without oral contrast. Proximal small bowel lipoma, unchanged. Severe descending and sigmoid colonic diverticulosis. Additional scattered colonic diverticula. Moderate amount of retained large bowel stool. VASCULAR/LYMPHATIC: No lymphadenopathy by CT size criteria. REPRODUCTIVE: Normal. OTHER: No intraperitoneal free fluid or free air. MUSCULOSKELETAL: Nonacute. Stable acetabular subchondral cysts. Osteopenia. Severe degenerative change of lumbar spine with grade 1 L4-5  anterolisthesis on degenerative basis. Mild  broad lumbar levoscoliosis. Review of the MIP images confirms the above findings. IMPRESSION: CTA CHEST: 1. No acute vascular process. 2. Moderate cardiomegaly.  Bilateral atelectasis. 3. Acute nondisplaced RIGHT fourth rib fracture versus respiratory motion artifact. CTA ABDOMEN AND PELVIS: 1. No acute vascular process. 2. Thickened irregular gastric antrum most consistent with gastritis new from October 2018. 3. Atrophic RIGHT kidney and absent LEFT kidney. Aortic Atherosclerosis (ICD10-I70.0). Electronically Signed   By: Elon Alas M.D.   On: 04/24/2017 22:43    ROS: As per HPI otherwise negative.  Physical Exam: Vitals:   04/25/17 0900 04/25/17 1000 04/25/17 1100 04/25/17 1200  BP: (!) 128/55 (!) 114/37 (!) 124/44 (!) 133/54  Pulse: 63 (!) 56 (!) 58 (!) 55  Resp: (!) _0 Temp:      TempSrc:      SpO2: 98% 94% 99% 98%     General: Frail elderly AAF, but very articulate Head: NCAT sclera not icteric MMM- missing upper plate Neck: Supple.  Lungs: grossly clear - to painful to sit up away from bed for exam. Breathing is unlabored. Heart: RR 50s 3/6 murmur Abdomen: soft NT + BS Lower extremities: without significant edema or ischemic changes, no open wounds  Neuro: A & O  X 3. Psych:  Responds to questions appropriately with a normal affect. Dialysis Access: right lower AVGG + bruit  Dialysis Orders:  [place] T,Th,S 4 hrs 160 350/Auto 1.5 57.5 kg 2/2.25 Ca UF profile 2 Linear Na right lower AVGG Heparin 4000 Hectoral 3 mcg - off mircera - did get 30 12/4 Recent labs: hgb 12.1 12/13 30% sat INR 2.53  iPTH 486 Ca/P ok AF noted to have drop into the 300s - just checked last week   Assessment/Plan: 1. Syncopal episode with fall and fx right 4th rib  For Echo, trending troponins Heart rate now in the 50s noted to be lower than usual - outpatient pulse which ranges from 70 - 90s. Watch for further  Bradycardia - cards following 2. ESRD  -  TTS HD today K 5.4 - watch for AVGG problems -see #7 3. BP/volume  - CXR clear upon admission - I think she may be in too much pain to check orthostatic pre and post HD 4. Anemia  - hgb down to 10.4 - was 12.1 12/13 - if remains low - redsume ESA Thursday 5. Metabolic bone disease -  Continue hectorol/sensipar 6. Nutrition - renal diet/vitamin - add nepro 7. Drop in Access flow 12/13 from baseline-watch for recirculation - evaluate for  f/u with [facility] after discharge 8. PAF on chronic coumadin - INR therapeutic 9. Severe AS- pt previously declined TAVR 10. Thrombocytopenia - 126 plts - follow -   Myriam Jacobson, PA-C [place] [phone] 04/25/2017, 12:36 PM

## 2017-04-25 NOTE — Progress Notes (Signed)
PROGRESS NOTE    [name]  ONG:[id number] DOB: November 09, 1921 DOA: 04/24/2017 PCP: Levin Erp, MD    Brief Narrative: [name] is a 83 y.o. female with medical history significant for end-stage renal disease on hemodialysis, aortic stenosis, and recent hypotension, now presenting to the emergency department after an apparent syncopal episode.  EMS was reportedly called out to the patient's house on 04/20/2017 for evaluation of hypotension, but she refused transport to the hospital at that time.  EMS was called out again today for an apparent syncopal episode after the patient had complained of upper abdominal discomfort, but walked to the restroom, and was then heard falling by family.  Patient had regained consciousness within seconds, denied hitting her head, and reports pain in the mid back.  She remembers sitting on the toilet, and then going to get up, and then recalls waking up on the floor with back pain.  There was no chest pain or palpitations associated with this.     Assessment & Plan:   Principal Problem:   Syncope Active Problems:   ESRD on dialysis ([facility])   PAF (paroxysmal atrial fibrillation) (HCC)   Hypotension   Gastritis   1-Syncope; suspect related to hypotension, vs arrhythmia.  Monitor on telemetry.  Check cardiac enzymes.  ECHO ordered Prior admission related to hypotension.  Cardiology consulted.  Check CT head, patient on coumadin.   ESRD; T, Thu, Sat.  Nephrology consulted.    Paroxysmal A fib;  On coumadin.  BB stopped in the past due to hypotension.   Gastritis ; by ct Continue with pepcid.   Rib Fracture; pain management.      DVT prophylaxis: Warfarin  Code Status: full code.  Family Communication:  Disposition Plan: home at time of discharge  Consultants:   Cardiology  nephrology   Procedures: echo   Antimicrobials: none   Subjective: She is alert, denies chest pain, complaints of right side back pain.    She only remember that she had to go to bathroom , then she pass out.    Objective: Vitals:   04/25/17 0600 04/25/17 0700 04/25/17 0745 04/25/17 0800  BP: (!) 112/42 (!) 120/42  (!) 125/45  Pulse: (!) 52 (!) 55 (!) 57 (!) 55  Resp: 10 12 16 13   Temp:      TempSrc:      SpO2: 98% 97% 100% 98%   No intake or output data in the 24 hours ending 04/25/17 0843 There were no vitals filed for this visit.  Examination:  General exam: Appears calm and comfortable  Respiratory system: Clear to auscultation. Respiratory effort normal. Cardiovascular system: S1 & S2 heard, RRR. No JVD, murmurs, rubs, gallops or clicks. No pedal edema. Gastrointestinal system: Abdomen is nondistended, soft and nontender. No organomegaly or masses felt. Normal bowel sounds heard. Central nervous system: Alert and oriented. No focal neurological deficits. Extremities: Symmetric 5 x 5 power. Skin: No rashes, lesions or ulcers    Data Reviewed: I have personally reviewed following labs and imaging studies  CBC: Recent Labs  Lab 04/24/17 2011 04/25/17 0100 04/25/17 0307  WBC 5.2  --  4.9  HGB 13.0 11.2* 10.4*  HCT 39.8 33.0* 32.0*  MCV 98.5  --  99.4  PLT 126*  --   --    Basic Metabolic Panel: Recent Labs  Lab 04/25/17 0045 04/25/17 0100 04/25/17 0307  NA 133* 132* 133*  K 5.1 5.0 5.4*  CL 92* 94* 94*  CO2 29  --  27  GLUCOSE 100* 99 91  BUN 24* 26* 25*  CREATININE 6.70* 7.10* 7.01*  CALCIUM 9.0  --  9.0   GFR: CrCl cannot be calculated (Unknown ideal weight.). Liver Function Tests: Recent Labs  Lab 04/25/17 0045  AST 22  ALT 12*  ALKPHOS 59  BILITOT 0.7  PROT 5.8*  ALBUMIN 3.4*   No results for input(s): LIPASE, AMYLASE in the last 168 hours. No results for input(s): AMMONIA in the last 168 hours. Coagulation Profile: Recent Labs  Lab 04/24/17 2011 04/25/17 0307  INR 2.39 2.73   Cardiac Enzymes: No results for input(s): CKTOTAL, CKMB, CKMBINDEX, TROPONINI in the last  168 hours. BNP (last 3 results) No results for input(s): PROBNP in the last 8760 hours. HbA1C: No results for input(s): HGBA1C in the last 72 hours. CBG: Recent Labs  Lab 04/24/17 2015 04/25/17 0729  GLUCAP 101* 83   Lipid Profile: No results for input(s): CHOL, HDL, LDLCALC, TRIG, CHOLHDL, LDLDIRECT in the last 72 hours. Thyroid Function Tests: No results for input(s): TSH, T4TOTAL, FREET4, T3FREE, THYROIDAB in the last 72 hours. Anemia Panel: No results for input(s): VITAMINB12, FOLATE, FERRITIN, TIBC, IRON, RETICCTPCT in the last 72 hours. Sepsis Labs: No results for input(s): PROCALCITON, LATICACIDVEN in the last 168 hours.  No results found for this or any previous visit (from the past 240 hour(s)).       Radiology Studies: Dg Chest Portable 1 View  Result Date: 04/24/2017 CLINICAL DATA:  Pt called out Thursday for hypotension was found to be orthostatic but refused to go to the hospital. Pt Called out today for syncope. EXAM: PORTABLE CHEST 1 VIEW COMPARISON:  Chest x-ray dated 02/09/2017. FINDINGS: Stable cardiomegaly. Aortic atherosclerosis. Chronic fibrosis and/or chronic bronchitic changes at the lung bases, stable. No confluent opacity to suggest a developing pneumonia. No pleural effusion or pneumothorax seen. Osseous structures about the chest are unremarkable. IMPRESSION: 1. No active disease.  No evidence of pneumonia or pulmonary edema. 2. Stable cardiomegaly. 3. Chronic bronchitic changes and/or fibrosis at the lung bases. 4. Aortic atherosclerosis. Electronically Signed   By: Franki Cabot M.D.   On: 04/24/2017 21:00   Ct Angio Chest/abd/pel For Dissection W And/or Wo Contrast  Result Date: 04/24/2017 CLINICAL DATA:  Syncopal episode and fall. Mid abdominal and chest pain. Back injury. History of end-stage renal disease on dialysis, hypertension, RIGHT brachiocephalic pseudo aneurysm. EXAM: CT ANGIOGRAPHY CHEST, ABDOMEN AND PELVIS TECHNIQUE: Multidetector CT  imaging through the chest, abdomen and pelvis was performed using the standard protocol during bolus administration of intravenous contrast. Multiplanar reconstructed images and MIPs were obtained and reviewed to evaluate the vascular anatomy. CONTRAST:  <See Chart> ISOVUE-370 IOPAMIDOL (ISOVUE-370) INJECTION 76% COMPARISON:  Chest radiograph April 24, 2017 at 2022 hours and CT abdomen and pelvis February 09, 2018 FINDINGS: CTA CHEST FINDINGS CARDIOVASCULAR: Thoracic aorta is normal course and caliber. Moderate to severe calcific atherosclerosis aortic arch. No intrinsic density on noncontrast CT. Homogeneous contrast opacification of thoracic aorta without dissection, aneurysm, luminal irregularity, periaortic fluid collections, or contrast extravasation. The heart is mildly enlarged. Moderate coronary artery calcifications. No pericardial effusion. Moderate stenosis brachiocephalic confluence with turbulent blood flow in heterogeneous contrast opacification. Though not tailored for evaluation. No central pulmonary emboli. MEDIASTINUM/NODES: No mediastinal mass or lymphadenopathy by CT size criteria. LUNGS/PLEURA: Tracheobronchial tree is patent, no pneumothorax. Bilateral lower lobe atelectasis. No pleural effusions, focal consolidations, pulmonary nodules or masses. MUSCULOSKELETAL: Respiratory motion versus nondisplaced RIGHT posterolateral fourth rib fracture. Osteopenia. Large LEFT subacromial  bursal collection. Small RIGHT subacromial bursal collection. Old sternal fracture. Severe degenerative change of thoracic spine. Review of the MIP images confirms the above findings. CTA ABDOMEN AND PELVIS FINDINGS VASCULAR Aorta: Abdominal aorta is normal course and caliber. , moderate calcific atherosclerosis. Homogeneous contrast opacification of aortoiliac vessels without dissection, aneurysm, luminal irregularity, periaortic fluid collections, or contrast extravasation. Celiac: Patent. SMA: Patent. Renals: Patent.  Moderate stenosis RIGHT renal artery. Absent LEFT renal artery. IMA: Patent. Inflow: Negative. Veins: Negative, not tailored for evaluation. Review of the MIP images confirms the above findings. NON-VASCULAR HEPATOBILIARY: Re- demonstration of multiple hepatic, and scattered hepatic calcifications. Normal gallbladder. PANCREAS: Normal. SPLEEN: Normal. ADRENALS/URINARY TRACT: Absent RIGHT kidney. Extremely atrophic RIGHT kidney with 15 mm cyst. Urinary bladder is decompressed. STOMACH/BOWEL: Thickened, irregular gastric antrum. Mildly hyperemic proximal duodenum. The small and large bowel are normal in course and caliber without inflammatory changes, sensitivity decreased without oral contrast. Proximal small bowel lipoma, unchanged. Severe descending and sigmoid colonic diverticulosis. Additional scattered colonic diverticula. Moderate amount of retained large bowel stool. VASCULAR/LYMPHATIC: No lymphadenopathy by CT size criteria. REPRODUCTIVE: Normal. OTHER: No intraperitoneal free fluid or free air. MUSCULOSKELETAL: Nonacute. Stable acetabular subchondral cysts. Osteopenia. Severe degenerative change of lumbar spine with grade 1 L4-5 anterolisthesis on degenerative basis. Mild broad lumbar levoscoliosis. Review of the MIP images confirms the above findings. IMPRESSION: CTA CHEST: 1. No acute vascular process. 2. Moderate cardiomegaly.  Bilateral atelectasis. 3. Acute nondisplaced RIGHT fourth rib fracture versus respiratory motion artifact. CTA ABDOMEN AND PELVIS: 1. No acute vascular process. 2. Thickened irregular gastric antrum most consistent with gastritis new from October 2018. 3. Atrophic RIGHT kidney and absent LEFT kidney. Aortic Atherosclerosis (ICD10-I70.0). Electronically Signed   By: Elon Alas M.D.   On: 04/24/2017 22:43        Scheduled Meds: . amiodarone  100 mg Oral QHS  . cinacalcet  30 mg Oral Q breakfast  . doxercalciferol  2 mcg Intravenous Q T,Th,Sa-HD  . feeding supplement  (NEPRO CARB STEADY)  237 mL Oral BID BM  . iopamidol      . multivitamin  1 tablet Oral Daily  . sodium chloride flush  3 mL Intravenous Q12H  . sodium chloride flush  3 mL Intravenous Q12H  . warfarin  3.75 mg Oral q1800  . Warfarin - Pharmacist Dosing Inpatient   Does not apply q1800   Continuous Infusions: . sodium chloride    . famotidine (PEPCID) IV       LOS: 0 days    Time spent: 35 minutes.     Elmarie Shiley, MD [facility] Pager [phone]  If 7PM-7AM, please contact night-coverage [email or web address] Password [facility] 04/25/2017, 8:43 AM

## 2017-04-25 NOTE — ED Notes (Addendum)
Renal diet breakfast tray ordered @ 0802.

## 2017-04-26 ENCOUNTER — Inpatient Hospital Stay (HOSPITAL_COMMUNITY): Payer: Medicare Other

## 2017-04-26 DIAGNOSIS — I361 Nonrheumatic tricuspid (valve) insufficiency: Secondary | ICD-10-CM

## 2017-04-26 LAB — ECHOCARDIOGRAM COMPLETE
Height: 64 in
WEIGHTICAEL: 2016 [oz_av]

## 2017-04-26 LAB — BASIC METABOLIC PANEL
Anion gap: 11 (ref 5–15)
BUN: 9 mg/dL (ref 6–20)
CO2: 27 mmol/L (ref 22–32)
CREATININE: 4.31 mg/dL — AB (ref 0.44–1.00)
Calcium: 8.5 mg/dL — ABNORMAL LOW (ref 8.9–10.3)
Chloride: 96 mmol/L — ABNORMAL LOW (ref 101–111)
GFR calc Af Amer: 9 mL/min — ABNORMAL LOW (ref >60)
GFR, EST NON AFRICAN AMERICAN: 8 mL/min — AB (ref >60)
GLUCOSE: 103 mg/dL — AB (ref 65–99)
POTASSIUM: 4.7 mmol/L (ref 3.5–5.1)
Sodium: 134 mmol/L — ABNORMAL LOW (ref 135–145)

## 2017-04-26 LAB — PROTIME-INR
INR: 1.9
PROTHROMBIN TIME: 21.6 s — AB (ref 11.4–15.2)

## 2017-04-26 LAB — GLUCOSE, CAPILLARY
GLUCOSE-CAPILLARY: 79 mg/dL (ref 65–99)
GLUCOSE-CAPILLARY: 73 mg/dL (ref 65–99)

## 2017-04-26 LAB — TROPONIN I: Troponin I: 0.03 ng/mL (ref <0.03)

## 2017-04-26 LAB — MRSA PCR SCREENING: MRSA BY PCR: NEGATIVE

## 2017-04-26 MED ORDER — WARFARIN 1.25 MG HALF TABLET
3.7500 mg | ORAL_TABLET | ORAL | Status: AC
  Administered 2017-04-26: 08:00:00 3.75 mg via ORAL
  Filled 2017-04-26: qty 1

## 2017-04-26 MED ORDER — FAMOTIDINE 20 MG PO TABS: 20.0000 mg | ORAL_TABLET | Freq: Every day | ORAL | Status: DC

## 2017-04-26 MED ORDER — WARFARIN SODIUM 2.5 MG PO TABS: 3.7500 mg | ORAL_TABLET | Freq: Every day | ORAL | 0 refills | Status: DC

## 2017-04-26 MED ORDER — OXYCODONE HCL 5 MG PO TABS: 5.0000 mg | ORAL_TABLET | Freq: Four times a day (QID) | ORAL | 0 refills | Status: DC | PRN

## 2017-04-26 MED ORDER — FAMOTIDINE 20 MG PO TABS: 20.0000 mg | ORAL_TABLET | Freq: Every day | ORAL | 0 refills | Status: DC

## 2017-04-26 MED ORDER — OXYCODONE HCL 5 MG PO TABS: 2.5000 mg | ORAL_TABLET | Freq: Four times a day (QID) | ORAL | 0 refills | Status: DC | PRN

## 2017-04-26 MED ORDER — WARFARIN SODIUM 1 MG PO TABS
1.0000 mg | ORAL_TABLET | Freq: Once | ORAL | Status: DC
  Filled 2017-04-26: qty 1

## 2017-04-26 NOTE — Progress Notes (Signed)
Tech asked pt if they would like to wash up. Pt said they are going to be discharged and that they do not want one. Tech will check back in later.

## 2017-04-26 NOTE — Progress Notes (Addendum)
ANTICOAGULATION CONSULT NOTE - Consult  Pharmacy Consult for Coumadin Indication: atrial fibrillation  Allergies  Allergen Reactions  . Tape Other (See Comments)    Pulls off skin  . Sulfa Antibiotics Other (See Comments)    Bumps all over body   Vital Signs: Temp: 98.3 F (36.8 C) (12/19 0430) Temp Source: Oral (12/19 0430) BP: 129/34 (12/19 0430) Pulse Rate: 64 (12/19 0430)  Labs: Recent Labs    04/24/17 2011 04/25/17 0045 04/25/17 0100 04/25/17 0307 04/25/17 0956 04/25/17 2118 04/26/17 0325  HGB 13.0  --  11.2* 10.4*  --   --   --   HCT 39.8  --  33.0* 32.0*  --   --   --   PLT 126*  --   --   --   --   --   --   LABPROT 25.9*  --   --  28.7*  --   --  21.6*  INR 2.39  --   --  2.73  --   --  1.90  CREATININE  --  6.70* 7.10* 7.01*  --   --   --   TROPONINI  --   --   --   --  <0.03 0.03* 0.03*   Medical History: Past Medical History:  Diagnosis Date  . Anemia   . Aortic stenosis   . Benign bladder mass   . Chronic cough   . DJD (degenerative joint disease)   . Dyslipidemia   . ESRD on hemodialysis ([facility])    "TTS; [facility]" (09/27/2016)  . Gout   . HTN (hypertension)   . Obstructive sleep apnea    mild  . Orthostatic syncope    around year 2006  . Pneumonia   . TIA (transient ischemic attack)    Assessment: 83yo female presents w/ LOC, CP, and abdominal pain, admitted for further w/u, to continue Coumadin for Afib; admit INR at goal with last dose of Coumadin taken 12/16.  INR 1.9 this morning; dose not given yesterday for unknown reason. Pharmacy entered warfarin to be given yesterday evening at 1800 but not charted by the RN on the [facility]. Spoke with MD this morning who wanted to give a dose STAT and continue to monitor the patient without adding bridge therapy. Will re-evaluate the need for heparin bridge with next INR. Hgb down slightly, no overt bleeding documented.  Goal of Therapy:  INR 2-3 Monitor platelets by anticoagulation protocol: Yes    Plan:  Coumadin 3.75mg  STAT then 1mg  tonight at 1800 Daily and monitor INR Monitor for s/sx of bleeding  Georga Bora, PharmD Clinical Pharmacist 04/26/2017 8:12 AM

## 2017-04-26 NOTE — Progress Notes (Signed)
MD Regalado, stated to place 2D echo order for discharge and once results are in pt may leave   Called 2D echo technician  Technician at bedside now

## 2017-04-26 NOTE — Progress Notes (Signed)
  Echocardiogram 2D Echocardiogram has been performed.  [name] 04/26/2017, 3:00 PM

## 2017-04-26 NOTE — Progress Notes (Signed)
MD called and requested a set of orthostatic vital signs

## 2017-04-26 NOTE — Progress Notes (Signed)
Pt IV discontinued, catheter intact and telemetry removed. Pt has all belongings including printed prescriptions and rolling walker. Pt discharge education provided at bedside with pt and pt family. Pt discharged via wheelchair with nurse staff

## 2017-04-26 NOTE — Progress Notes (Signed)
Progress Note  Patient Name: [name] Date of Encounter: 04/26/2017  Primary Cardiologist: Minus Breeding, MD   Subjective   No chest pain or dyspnea  Inpatient Medications    Scheduled Meds: . amiodarone  100 mg Oral QHS  . cinacalcet  30 mg Oral Q breakfast  . doxercalciferol  2 mcg Intravenous Q T,Th,Sa-HD  . feeding supplement (NEPRO CARB STEADY)  237 mL Oral BID BM  . multivitamin  1 tablet Oral Daily  . sodium chloride flush  3 mL Intravenous Q12H  . sodium chloride flush  3 mL Intravenous Q12H  . warfarin  1 mg Oral ONCE-1800  . Warfarin - Pharmacist Dosing Inpatient   Does not apply q1800   Continuous Infusions: . sodium chloride    . sodium chloride    . sodium chloride    . famotidine (PEPCID) IV Stopped (04/26/17 1022)   PRN Meds: sodium chloride, sodium chloride, sodium chloride, acetaminophen **OR** acetaminophen, bisacodyl, heparin, heparin, HYDROmorphone (DILAUDID) injection, lidocaine (PF), lidocaine-prilocaine, ondansetron **OR** ondansetron (ZOFRAN) IV, oxyCODONE, pentafluoroprop-tetrafluoroeth, senna-docusate, sodium chloride flush   Vital Signs    Vitals:   04/25/17 2127 04/26/17 0039 04/26/17 0430 04/26/17 0817  BP:  (!) 107/38 (!) 129/34 (!) 118/42  Pulse:  (!) 57 64 62  Resp:  18 18 18   Temp:  97.9 F (36.6 C) 98.3 F (36.8 C) 97.9 F (36.6 C)  TempSrc:  Oral Oral Oral  SpO2:   96% 95%  Weight: 128 lb 11.2 oz (58.4 kg)  126 lb (57.2 kg)   Height: 5\' 4"  (1.626 m)       Intake/Output Summary (Last 24 hours) at 04/26/2017 1102 Last data filed at 04/26/2017 0830 Gross per 24 hour  Intake 340 ml  Output 1000 ml  Net -660 ml   Filed Weights   04/25/17 2108 04/25/17 2127 04/26/17 0430  Weight: 128 lb 11.2 oz (58.4 kg) 128 lb 11.2 oz (58.4 kg) 126 lb (57.2 kg)    Telemetry    Sinus Personally Reviewed   Physical Exam   GEN: No acute distress.   Neck: No JVD Cardiac: RRR, 3/6 systolic murmur Respiratory: Clear to  auscultation bilaterally. GI: Soft, nontender, non-distended  MS: No edema Neuro:  Nonfocal  Psych: Normal affect   Labs    Chemistry Recent Labs  Lab 04/25/17 0045 04/25/17 0100 04/25/17 0307 04/26/17 0819  NA 133* 132* 133* 134*  K 5.1 5.0 5.4* 4.7  CL 92* 94* 94* 96*  CO2 29  --  27 27  GLUCOSE 100* 99 91 103*  BUN 24* 26* 25* 9  CREATININE 6.70* 7.10* 7.01* 4.31*  CALCIUM 9.0  --  9.0 8.5*  PROT 5.8*  --   --   --   ALBUMIN 3.4*  --   --   --   AST 22  --   --   --   ALT 12*  --   --   --   ALKPHOS 59  --   --   --   BILITOT 0.7  --   --   --   GFRNONAA 5*  --  4* 8*  GFRAA 5*  --  5* 9*  ANIONGAP 12  --  12 11     Hematology Recent Labs  Lab 04/24/17 2011 04/25/17 0100 04/25/17 0307  WBC 5.2  --  4.9  RBC 4.04  --  3.22*  HGB 13.0 11.2* 10.4*  HCT 39.8 33.0* 32.0*  MCV 98.5  --  99.4  MCH 32.2  --  32.3  MCHC 32.7  --  32.5  RDW 14.3  --  14.5  PLT 126*  --   --     Cardiac Enzymes Recent Labs  Lab 04/25/17 0956 04/25/17 2118 04/26/17 0325  TROPONINI <0.03 0.03* 0.03*    Recent Labs  Lab 04/24/17 2022  TROPIPOC 0.02     Radiology    Ct Head Wo Contrast  Result Date: 04/25/2017 CLINICAL DATA:  Syncopal episode last night. Denies hitting head. Next medial 10/06/2010 EXAM: CT HEAD WITHOUT CONTRAST TECHNIQUE: Contiguous axial images were obtained from the base of the skull through the vertex without intravenous contrast. COMPARISON:  None. FINDINGS: Brain: New new Generalized cerebral atrophy. Periventricular white matter low attenuation likely secondary to microangiopathy. Vascular: Cerebrovascular atherosclerotic calcifications are noted. Skull: Negative for fracture or focal lesion. Sinuses/Orbits: Visualized portions of the orbits are unremarkable. Visualized portions of the paranasal sinuses and mastoid air cells are unremarkable. Other: None. IMPRESSION: 1. No acute intracranial pathology. 2. Chronic microvascular disease and cerebral  atrophy. Electronically Signed   By: Kathreen Devoid   On: 04/25/2017 09:51   Dg Chest Portable 1 View  Result Date: 04/24/2017 CLINICAL DATA:  Pt called out Thursday for hypotension was found to be orthostatic but refused to go to the hospital. Pt Called out today for syncope. EXAM: PORTABLE CHEST 1 VIEW COMPARISON:  Chest x-ray dated 02/09/2017. FINDINGS: Stable cardiomegaly. Aortic atherosclerosis. Chronic fibrosis and/or chronic bronchitic changes at the lung bases, stable. No confluent opacity to suggest a developing pneumonia. No pleural effusion or pneumothorax seen. Osseous structures about the chest are unremarkable. IMPRESSION: 1. No active disease.  No evidence of pneumonia or pulmonary edema. 2. Stable cardiomegaly. 3. Chronic bronchitic changes and/or fibrosis at the lung bases. 4. Aortic atherosclerosis. Electronically Signed   By: Franki Cabot M.D.   On: 04/24/2017 21:00   Ct Angio Chest/abd/pel For Dissection W And/or Wo Contrast  Result Date: 04/24/2017 CLINICAL DATA:  Syncopal episode and fall. Mid abdominal and chest pain. Back injury. History of end-stage renal disease on dialysis, hypertension, RIGHT brachiocephalic pseudo aneurysm. EXAM: CT ANGIOGRAPHY CHEST, ABDOMEN AND PELVIS TECHNIQUE: Multidetector CT imaging through the chest, abdomen and pelvis was performed using the standard protocol during bolus administration of intravenous contrast. Multiplanar reconstructed images and MIPs were obtained and reviewed to evaluate the vascular anatomy. CONTRAST:  <See Chart> ISOVUE-370 IOPAMIDOL (ISOVUE-370) INJECTION 76% COMPARISON:  Chest radiograph April 24, 2017 at 2022 hours and CT abdomen and pelvis February 09, 2018 FINDINGS: CTA CHEST FINDINGS CARDIOVASCULAR: Thoracic aorta is normal course and caliber. Moderate to severe calcific atherosclerosis aortic arch. No intrinsic density on noncontrast CT. Homogeneous contrast opacification of thoracic aorta without dissection, aneurysm,  luminal irregularity, periaortic fluid collections, or contrast extravasation. The heart is mildly enlarged. Moderate coronary artery calcifications. No pericardial effusion. Moderate stenosis brachiocephalic confluence with turbulent blood flow in heterogeneous contrast opacification. Though not tailored for evaluation. No central pulmonary emboli. MEDIASTINUM/NODES: No mediastinal mass or lymphadenopathy by CT size criteria. LUNGS/PLEURA: Tracheobronchial tree is patent, no pneumothorax. Bilateral lower lobe atelectasis. No pleural effusions, focal consolidations, pulmonary nodules or masses. MUSCULOSKELETAL: Respiratory motion versus nondisplaced RIGHT posterolateral fourth rib fracture. Osteopenia. Large LEFT subacromial bursal collection. Small RIGHT subacromial bursal collection. Old sternal fracture. Severe degenerative change of thoracic spine. Review of the MIP images confirms the above findings. CTA ABDOMEN AND PELVIS FINDINGS VASCULAR Aorta: Abdominal aorta is normal course and caliber. , moderate calcific atherosclerosis. Homogeneous  contrast opacification of aortoiliac vessels without dissection, aneurysm, luminal irregularity, periaortic fluid collections, or contrast extravasation. Celiac: Patent. SMA: Patent. Renals: Patent. Moderate stenosis RIGHT renal artery. Absent LEFT renal artery. IMA: Patent. Inflow: Negative. Veins: Negative, not tailored for evaluation. Review of the MIP images confirms the above findings. NON-VASCULAR HEPATOBILIARY: Re- demonstration of multiple hepatic, and scattered hepatic calcifications. Normal gallbladder. PANCREAS: Normal. SPLEEN: Normal. ADRENALS/URINARY TRACT: Absent RIGHT kidney. Extremely atrophic RIGHT kidney with 15 mm cyst. Urinary bladder is decompressed. STOMACH/BOWEL: Thickened, irregular gastric antrum. Mildly hyperemic proximal duodenum. The small and large bowel are normal in course and caliber without inflammatory changes, sensitivity decreased without  oral contrast. Proximal small bowel lipoma, unchanged. Severe descending and sigmoid colonic diverticulosis. Additional scattered colonic diverticula. Moderate amount of retained large bowel stool. VASCULAR/LYMPHATIC: No lymphadenopathy by CT size criteria. REPRODUCTIVE: Normal. OTHER: No intraperitoneal free fluid or free air. MUSCULOSKELETAL: Nonacute. Stable acetabular subchondral cysts. Osteopenia. Severe degenerative change of lumbar spine with grade 1 L4-5 anterolisthesis on degenerative basis. Mild broad lumbar levoscoliosis. Review of the MIP images confirms the above findings. IMPRESSION: CTA CHEST: 1. No acute vascular process. 2. Moderate cardiomegaly.  Bilateral atelectasis. 3. Acute nondisplaced RIGHT fourth rib fracture versus respiratory motion artifact. CTA ABDOMEN AND PELVIS: 1. No acute vascular process. 2. Thickened irregular gastric antrum most consistent with gastritis new from October 2018. 3. Atrophic RIGHT kidney and absent LEFT kidney. Aortic Atherosclerosis (ICD10-I70.0). Electronically Signed   By: Elon Alas M.D.   On: 04/24/2017 22:43    Patient Profile     83 year old female with past medical history of severe aortic stenosis, paroxysmal atrial fibrillation on chronic Coumadin,   End-stage renal disease dialysis dependent, prior TIA, hyperlipidemia, obstructive sleep apnea who I am asked to evaluate for syncope.  Patient has a history of severe aortic stenosis but she does not want aggressive therapies such as TAVR.  She went to the bathroom to try and have a bowel movement.  She became nauseated and then stood to leave the bathroom and had a syncopal episode.      Assessment & Plan    1 syncope-based on description symptoms sound potentially to be vagally mediated or orthostatic.  She was sitting on the commode, developed nausea and then stood followed by syncope.  Telemetry with no significant arrhythmia. Pt instructed not drive for 6 months.  Primary care has  ordered an echocardiogram to assess LV function and aortic stenosis.  However she makes it clear that she does not want aggressive measures such as TAVR.  2 history of severe aortic stenosis-as above patient does not want aggressive measures. Echo pending.  3 history of paroxysmal atrial fibrillation-patient remains in sinus rhythm.  Continue amiodarone and Coumadin.  If more frequent falls in the future, would need to consider DCing coumadin.  4 end-stage renal disease-we will discuss with nephrology as she will need dialysis while here.  Pt can be Dced from a cardiac standpoint and fu with Dr Percival Spanish.  For questions or updates, please contact [place] Please consult [email or web address] for contact info under Cardiology/STEMI.      Signed, Kirk Ruths, MD  04/26/2017, 11:02 AM

## 2017-04-26 NOTE — Progress Notes (Signed)
Informed case manager of needing rolling walker for discharge  Case manager aware

## 2017-04-26 NOTE — Discharge Instructions (Signed)

## 2017-04-26 NOTE — Progress Notes (Signed)
CRITICAL VALUE ALERT  Critical Value:  Troponin 0.03  Date & Time Notied:  04/26/2017  Provider Notified: Forrest Moron  Orders Received/Actions taken: No new orders at this time.

## 2017-04-26 NOTE — Care Management Note (Addendum)
Case Management Note  Patient Details  Name: [name] MRN: [id number] Date of Birth: November 30, 1921  Subjective/Objective:  Syncope                  Action/Plan: PTA Pt lived at home with daughter. PCP is Levin Erp. Uses [employer]. Orders placed for DME/HH PT.  In to speak with Pt, daughter at bedside. Discussed recommendations made for RN/PT/rolling walker.  Offered choice for [facility].  Referral called to [facility] with [facility] for [facility] PT/rolling walker.  PT will be returning home with daughter at [address], [place]; Phone: [phone]. NCM will continue to follow for discharge needs.              Expected Discharge Plan:  [place]  Discharge planning Services  CM Consult  Post Acute Care Choice:  Durable Medical Equipment  Choice offered to:  Patient, Adult Children  DME Arranged:  Walker rolling DME Agency:  [place] Arranged:  RN, PT [facility] Agency:  [place].  Status of Service:  In process, will continue to follow  Kristen Cardinal, RN 04/26/2017, 1:21 PM  [phone]

## 2017-04-26 NOTE — Discharge Summary (Signed)
Physician Discharge Summary  [name] JJK:[id number] DOB: 01-Apr-1922 DOA: 04/24/2017  PCP: Barbara Erp, MD  Admit date: 04/24/2017 Discharge date: 04/26/2017  Admitted From: Home  Disposition:  Home   Recommendations for Outpatient Follow-up:  1. Follow up with PCP in 1-2 weeks 2. Please obtain BMP/CBC in one week 3. Needs INR check and adjust coumadin as needed.  4. Follow up with cardiology for further care of syncope, recurrent.  5. Needs follow up of gastritis.    Home Health: yes.   Discharge Condition: Stable.  CODE STATUS: full code.  Diet recommendation: Heart Healthy   Brief/Interim Summary:  Brief Narrative: Barbara M Burnettis a 83 y.o.femalewith medical history significant forend-stage renal disease on hemodialysis, aortic stenosis, and recent hypotension, now presenting to the emergency department after an apparent syncopal episode. EMS was reportedly called out to the patient's house on 04/20/2017 for evaluation of hypotension, but she refused transport to the hospital at that time. EMS was called out again today for an apparent syncopal episode after the patient had complained of upper abdominal discomfort, but walked to the restroom, and was then heard falling by family. Patient had regained consciousness within seconds, denied hitting her head, and reports pain in the mid back. She remembers sitting on the toilet, and then going to get up, and then recalls waking up on the floor with back pain. There was no chest pain or palpitations associated with this.     Assessment & Plan:   Principal Problem:   Syncope Active Problems:   ESRD on dialysis ([place])   PAF (paroxysmal atrial fibrillation) (HCC)   Hypotension   Gastritis   1-Syncope; suspect related to hypotension, vs arrhythmia.  Monitor on telemetry. No arrythmia Cardiac enzymes normal. Orthostatic vitals normal  ECHO ordered. Aortic stenosis, normal EF.  Prior admission related  to hypotension.  Cardiology consulted. Syncope thought to be vaso-vagal.  Ct head negative  ESRD; T, Thu, Sat.  Nephrology consulted.    Paroxysmal A fib;  On coumadin.  BB stopped in the past due to hypotension.  INR 1.9. Need INR on Friday   Gastritis ; by ct Continue with pepcid.   Rib Fracture; pain management. improved. Short course of opioid.   Aortic stenosis. Patient is not interested in sx.    Discharge Diagnoses:  Principal Problem:   Syncope Active Problems:   ESRD on dialysis ([place])   PAF (paroxysmal atrial fibrillation) (HCC)   Hypotension   Gastritis    Discharge Instructions   Allergies as of 04/26/2017      Reactions   Tape Other (See Comments)   Pulls off skin   Sulfa Antibiotics Other (See Comments)   Bumps all over body      Medication List    STOP taking these medications   midodrine 10 MG tablet Commonly known as:  PROAMATINE     TAKE these medications   acetaminophen 500 MG tablet Commonly known as:  TYLENOL Take 500 mg by mouth every 6 (six) hours as needed.   amiodarone 200 MG tablet Commonly known as:  PACERONE Take 0.5 tablets (100 mg total) by mouth daily. What changed:  when to take this   feeding supplement (NEPRO CARB STEADY) Liqd Take 237 mLs by mouth 2 (two) times daily between meals.   multivitamin Tabs tablet Take 1 tablet by mouth daily.   oxyCODONE 5 MG immediate release tablet Commonly known as:  Oxy IR/ROXICODONE Take 1 tablet (5 mg total) by mouth every 6 (  six) hours as needed for moderate pain.   SENSIPAR 30 MG tablet Generic drug:  cinacalcet Take 30 mg by mouth at bedtime.   warfarin 2.5 MG tablet Commonly known as:  COUMADIN Take as directed. If you are unsure how to take this medication, talk to your nurse or doctor. Original instructions:  Take 1.5 tablets (3.75 mg total) by mouth at bedtime. Start taking on:  04/27/2017            Durable Medical Equipment  (From admission, onward)         Start     Ordered   04/26/17 1311  For home use only DME Walker rolling  Once    Question:  Patient needs a walker to treat with the following condition  Answer:  Weakness   04/26/17 [place] Follow up.   Why:  Arranged, rolling walker to be delivered to room prior to discharge.   Contact information: [place] [address] [address], [facility] Follow up.   Specialty:  Home Health Services Why:  Arranged home health RN/PT.  Will call you to set up appointments Contact information: [place] [address] [phone]          Allergies  Allergen Reactions  . Tape Other (See Comments)    Pulls off skin  . Sulfa Antibiotics Other (See Comments)    Bumps all over body    Consultations:  Cardiology  Nephrology    Procedures/Studies: Ct Head Wo Contrast  Result Date: 04/25/2017 CLINICAL DATA:  Syncopal episode last night. Denies hitting head. Next medial 10/06/2010 EXAM: CT HEAD WITHOUT CONTRAST TECHNIQUE: Contiguous axial images were obtained from the base of the skull through the vertex without intravenous contrast. COMPARISON:  None. FINDINGS: Brain: New new Generalized cerebral atrophy. Periventricular white matter low attenuation likely secondary to microangiopathy. Vascular: Cerebrovascular atherosclerotic calcifications are noted. Skull: Negative for fracture or focal lesion. Sinuses/Orbits: Visualized portions of the orbits are unremarkable. Visualized portions of the paranasal sinuses and mastoid air cells are unremarkable. Other: None. IMPRESSION: 1. No acute intracranial pathology. 2. Chronic microvascular disease and cerebral atrophy. Electronically Signed   By: Kathreen Devoid   On: 04/25/2017 09:51   Dg Chest Portable 1 View  Result Date: 04/24/2017 CLINICAL DATA:  Pt called out Thursday for hypotension was found to be  orthostatic but refused to go to the hospital. Pt Called out today for syncope. EXAM: PORTABLE CHEST 1 VIEW COMPARISON:  Chest x-ray dated 02/09/2017. FINDINGS: Stable cardiomegaly. Aortic atherosclerosis. Chronic fibrosis and/or chronic bronchitic changes at the lung bases, stable. No confluent opacity to suggest a developing pneumonia. No pleural effusion or pneumothorax seen. Osseous structures about the chest are unremarkable. IMPRESSION: 1. No active disease.  No evidence of pneumonia or pulmonary edema. 2. Stable cardiomegaly. 3. Chronic bronchitic changes and/or fibrosis at the lung bases. 4. Aortic atherosclerosis. Electronically Signed   By: Franki Cabot M.D.   On: 04/24/2017 21:00   Ct Angio Chest/abd/pel For Dissection W And/or Wo Contrast  Result Date: 04/24/2017 CLINICAL DATA:  Syncopal episode and fall. Mid abdominal and chest pain. Back injury. History of end-stage renal disease on dialysis, hypertension, RIGHT brachiocephalic pseudo aneurysm. EXAM: CT ANGIOGRAPHY CHEST, ABDOMEN AND PELVIS TECHNIQUE: Multidetector CT imaging through the chest, abdomen and pelvis was performed using the standard protocol during bolus administration  of intravenous contrast. Multiplanar reconstructed images and MIPs were obtained and reviewed to evaluate the vascular anatomy. CONTRAST:  <See Chart> ISOVUE-370 IOPAMIDOL (ISOVUE-370) INJECTION 76% COMPARISON:  Chest radiograph April 24, 2017 at 2022 hours and CT abdomen and pelvis February 09, 2018 FINDINGS: CTA CHEST FINDINGS CARDIOVASCULAR: Thoracic aorta is normal course and caliber. Moderate to severe calcific atherosclerosis aortic arch. No intrinsic density on noncontrast CT. Homogeneous contrast opacification of thoracic aorta without dissection, aneurysm, luminal irregularity, periaortic fluid collections, or contrast extravasation. The heart is mildly enlarged. Moderate coronary artery calcifications. No pericardial effusion. Moderate stenosis  brachiocephalic confluence with turbulent blood flow in heterogeneous contrast opacification. Though not tailored for evaluation. No central pulmonary emboli. MEDIASTINUM/NODES: No mediastinal mass or lymphadenopathy by CT size criteria. LUNGS/PLEURA: Tracheobronchial tree is patent, no pneumothorax. Bilateral lower lobe atelectasis. No pleural effusions, focal consolidations, pulmonary nodules or masses. MUSCULOSKELETAL: Respiratory motion versus nondisplaced RIGHT posterolateral fourth rib fracture. Osteopenia. Large LEFT subacromial bursal collection. Small RIGHT subacromial bursal collection. Old sternal fracture. Severe degenerative change of thoracic spine. Review of the MIP images confirms the above findings. CTA ABDOMEN AND PELVIS FINDINGS VASCULAR Aorta: Abdominal aorta is normal course and caliber. , moderate calcific atherosclerosis. Homogeneous contrast opacification of aortoiliac vessels without dissection, aneurysm, luminal irregularity, periaortic fluid collections, or contrast extravasation. Celiac: Patent. SMA: Patent. Renals: Patent. Moderate stenosis RIGHT renal artery. Absent LEFT renal artery. IMA: Patent. Inflow: Negative. Veins: Negative, not tailored for evaluation. Review of the MIP images confirms the above findings. NON-VASCULAR HEPATOBILIARY: Re- demonstration of multiple hepatic, and scattered hepatic calcifications. Normal gallbladder. PANCREAS: Normal. SPLEEN: Normal. ADRENALS/URINARY TRACT: Absent RIGHT kidney. Extremely atrophic RIGHT kidney with 15 mm cyst. Urinary bladder is decompressed. STOMACH/BOWEL: Thickened, irregular gastric antrum. Mildly hyperemic proximal duodenum. The small and large bowel are normal in course and caliber without inflammatory changes, sensitivity decreased without oral contrast. Proximal small bowel lipoma, unchanged. Severe descending and sigmoid colonic diverticulosis. Additional scattered colonic diverticula. Moderate amount of retained large bowel  stool. VASCULAR/LYMPHATIC: No lymphadenopathy by CT size criteria. REPRODUCTIVE: Normal. OTHER: No intraperitoneal free fluid or free air. MUSCULOSKELETAL: Nonacute. Stable acetabular subchondral cysts. Osteopenia. Severe degenerative change of lumbar spine with grade 1 L4-5 anterolisthesis on degenerative basis. Mild broad lumbar levoscoliosis. Review of the MIP images confirms the above findings. IMPRESSION: CTA CHEST: 1. No acute vascular process. 2. Moderate cardiomegaly.  Bilateral atelectasis. 3. Acute nondisplaced RIGHT fourth rib fracture versus respiratory motion artifact. CTA ABDOMEN AND PELVIS: 1. No acute vascular process. 2. Thickened irregular gastric antrum most consistent with gastritis new from October 2018. 3. Atrophic RIGHT kidney and absent LEFT kidney. Aortic Atherosclerosis (ICD10-I70.0). Electronically Signed   By: Elon Alas M.D.   On: 04/24/2017 22:43       Subjective: She is feeling well. Back pain and rib pain better  Discharge Exam: Vitals:   04/26/17 0817 04/26/17 1120  BP: (!) 118/42 (!) 118/28  Pulse: 62 61  Resp: 18 20  Temp: 97.9 F (36.6 C) 97.9 F (36.6 C)  SpO2: 95% 100%   Vitals:   04/26/17 0039 04/26/17 0430 04/26/17 0817 04/26/17 1120  BP: (!) 107/38 (!) 129/34 (!) 118/42 (!) 118/28  Pulse: (!) 57 64 62 61  Resp: 18 18 18 20   Temp: 97.9 F (36.6 C) 98.3 F (36.8 C) 97.9 F (36.6 C) 97.9 F (36.6 C)  TempSrc: Oral Oral Oral Oral  SpO2:  96% 95% 100%  Weight:  57.2 kg (126 lb)    Height:  General: Pt is alert, awake, not in acute distress Cardiovascular: RRR, S1/S2 +, no rubs, no gallops Respiratory: CTA bilaterally, no wheezing, no rhonchi Abdominal: Soft, NT, ND, bowel sounds + Extremities: no edema, no cyanosis    The results of significant diagnostics from this hospitalization (including imaging, microbiology, ancillary and laboratory) are listed below for reference.     Microbiology: Recent Results (from the past  240 hour(s))  MRSA PCR Screening     Status: None   Collection Time: 04/25/17  9:35 PM  Result Value Ref Range Status   MRSA by PCR NEGATIVE NEGATIVE Final    Comment:        The GeneXpert MRSA Assay (FDA approved for NASAL specimens only), is one component of a comprehensive MRSA colonization surveillance program. It is not intended to diagnose MRSA infection nor to guide or monitor treatment for MRSA infections.      Labs: BNP (last 3 results) Recent Labs    09/27/16 1125  BNP 1,540.0*   Basic Metabolic Panel: Recent Labs  Lab 04/25/17 0045 04/25/17 0100 04/25/17 0307 04/26/17 0819  NA 133* 132* 133* 134*  K 5.1 5.0 5.4* 4.7  CL 92* 94* 94* 96*  CO2 29  --  27 27  GLUCOSE 100* 99 91 103*  BUN 24* 26* 25* 9  CREATININE 6.70* 7.10* 7.01* 4.31*  CALCIUM 9.0  --  9.0 8.5*   Liver Function Tests: Recent Labs  Lab 04/25/17 0045  AST 22  ALT 12*  ALKPHOS 59  BILITOT 0.7  PROT 5.8*  ALBUMIN 3.4*   No results for input(s): LIPASE, AMYLASE in the last 168 hours. No results for input(s): AMMONIA in the last 168 hours. CBC: Recent Labs  Lab 04/24/17 2011 04/25/17 0100 04/25/17 0307  WBC 5.2  --  4.9  HGB 13.0 11.2* 10.4*  HCT 39.8 33.0* 32.0*  MCV 98.5  --  99.4  PLT 126*  --   --    Cardiac Enzymes: Recent Labs  Lab 04/25/17 0956 04/25/17 2118 04/26/17 0325  TROPONINI <0.03 0.03* 0.03*   BNP: Invalid input(s): POCBNP CBG: Recent Labs  Lab 04/24/17 2015 04/25/17 0729 04/26/17 0644 04/26/17 0741  GLUCAP 101* 83 79 73   D-Dimer No results for input(s): DDIMER in the last 72 hours. Hgb A1c No results for input(s): HGBA1C in the last 72 hours. Lipid Profile No results for input(s): CHOL, HDL, LDLCALC, TRIG, CHOLHDL, LDLDIRECT in the last 72 hours. Thyroid function studies No results for input(s): TSH, T4TOTAL, T3FREE, THYROIDAB in the last 72 hours.  Invalid input(s): FREET3 Anemia work up No results for input(s): VITAMINB12, FOLATE,  FERRITIN, TIBC, IRON, RETICCTPCT in the last 72 hours. Urinalysis No results found for: COLORURINE, APPEARANCEUR, [place], [place], [place], [place], [place], [place], PROTEINUR, UROBILINOGEN, NITRITE, LEUKOCYTESUR Sepsis Labs Invalid input(s): PROCALCITONIN,  WBC,  LACTICIDVEN Microbiology Recent Results (from the past 240 hour(s))  MRSA PCR Screening     Status: None   Collection Time: 04/25/17  9:35 PM  Result Value Ref Range Status   MRSA by PCR NEGATIVE NEGATIVE Final    Comment:        The GeneXpert MRSA Assay (FDA approved for NASAL specimens only), is one component of a comprehensive MRSA colonization surveillance program. It is not intended to diagnose MRSA infection nor to guide or monitor treatment for MRSA infections.      Time coordinating discharge: Over 30 minutes  SIGNED:   Elmarie Shiley, MD  [facility] 04/26/2017, 1:43 PM  Pager   If 7PM-7AM, please contact night-coverage [email or web address] Password [email or web address]

## 2017-04-26 NOTE — Evaluation (Signed)
Physical Therapy Evaluation Patient Details Name: [name] MRN: [id number] DOB: February 13, 1922 Today's Date: 04/26/2017   History of Present Illness  Pt is a 83 y.o. female with PMH significant for ESRD on HD, aortic stenosis, and recent hypotension, who presented to ED on 04/24/17 after apparent syncopal episode; pt recalls waking up on floor with back pain and found by family. CTA chest showed R 4th rib fx. Head CT showed no acute intracranial pathology.     Clinical Impression  Pt presents with an overall decrease in functional mobility secondary to above. PTA, pt indep and lives with daughter available for 24/7 support; pt drives to HD appointments. Today, pt able to amb with no DME and intermittent min guard for balance; stability much improved with use of RW which pt is willing to use as needed at home. From a mobility perspective, feel pt is safe to d/c home with supervision from daughter, RW, and HHPT services. Will continue to follow acutely to maximize functional mobility and independence.    Follow Up Recommendations Home health PT;Supervision for mobility/OOB    Equipment Recommendations  Rolling walker with 5" wheels    Recommendations for Other Services       Precautions / Restrictions Precautions Precautions: Fall Restrictions Weight Bearing Restrictions: No      Mobility  Bed Mobility Overal bed mobility: Independent                Transfers Overall transfer level: Needs assistance Equipment used: None Transfers: Sit to/from Stand Sit to Stand: Supervision            Ambulation/Gait Ambulation/Gait assistance: Supervision;Min guard Ambulation Distance (Feet): 250 Feet Assistive device: None;Rolling walker (2 wheeled) Gait Pattern/deviations: Step-through pattern;Decreased stride length;Antalgic Gait velocity: Decreased Gait velocity interpretation: <1.8 ft/sec, indicative of risk for recurrent falls General Gait Details: Amb in hallway with  no DME and intermittent min guard for balance. Stability improved with RW which pt would like to have for home use; supervision with RW  Stairs Stairs: Yes Stairs assistance: Supervision Stair Management: One rail Right;Forwards Number of Stairs: 5 General stair comments: Simulated ascend/descending 5 steps with use of R-rail in hallway and high knee marching. Supervision for safety  Wheelchair Mobility    Modified Rankin (Stroke Patients Only)       Balance Overall balance assessment: Needs assistance   Sitting balance-Leahy Scale: Good       Standing balance-Leahy Scale: Fair Standing balance comment: Fair with no UE support                             Pertinent Vitals/Pain Pain Assessment: Faces Faces Pain Scale: Hurts a little bit Pain Location: R ribs, head Pain Descriptors / Indicators: Sore Pain Intervention(s): Monitored during session    Home Living Family/patient expects to be discharged to:: Private residence Living Arrangements: Children Available Help at Discharge: Family Type of Home: House Home Access: Stairs to enter Entrance Stairs-Rails: Psychiatric nurse of Steps: 2 Home Layout: One level Home Equipment: Shower seat - built in;Grab bars - toilet;Grab bars - tub/shower      Prior Function Level of Independence: Independent         Comments: Drives herself to HD (although daughter available to if needed). Indep with ADLs. Helps with some cooking and cleaning     Hand Dominance        Extremity/Trunk Assessment   Upper Extremity Assessment Upper Extremity Assessment: Generalized  weakness    Lower Extremity Assessment Lower Extremity Assessment: Generalized weakness    Cervical / Trunk Assessment Cervical / Trunk Assessment: Kyphotic  Communication   Communication: No difficulties  Cognition Arousal/Alertness: Awake/alert Behavior During Therapy: WFL for tasks assessed/performed Overall Cognitive  Status: Within Functional Limits for tasks assessed                                        General Comments General comments (skin integrity, edema, etc.): Pt asymptomatic    Exercises     Assessment/Plan    PT Assessment Patient needs continued PT services  PT Problem List Decreased strength;Decreased balance;Decreased mobility;Decreased knowledge of use of DME       PT Treatment Interventions DME instruction;Gait training;Stair training;Functional mobility training;Therapeutic activities;Therapeutic exercise;Balance training;Patient/family education    PT Goals (Current goals can be found in the Care Plan section)  Acute Rehab PT Goals Patient Stated Goal: Return home ASAP PT Goal Formulation: With patient Time For Goal Achievement: 05/10/17 Potential to Achieve Goals: Good    Frequency Min 3X/week   Barriers to discharge        Co-evaluation               AM-PAC PT "6 Clicks" Daily Activity  Outcome Measure Difficulty turning over in bed (including adjusting bedclothes, sheets and blankets)?: None Difficulty moving from lying on back to sitting on the side of the bed? : None Difficulty sitting down on and standing up from a chair with arms (e.g., wheelchair, bedside commode, etc,.)?: A Little Help needed moving to and from a bed to chair (including a wheelchair)?: A Little Help needed walking in hospital room?: A Little Help needed climbing 3-5 steps with a railing? : A Little 6 Click Score: 20    End of Session Equipment Utilized During Treatment: Gait belt Activity Tolerance: Patient tolerated treatment well Patient left: in chair;with call bell/phone within reach;with nursing/sitter in room Nurse Communication: Mobility status PT Visit Diagnosis: Other abnormalities of gait and mobility (R26.89);Muscle weakness (generalized) (M62.81)    Time: 7793-9030 PT Time Calculation (min) (ACUTE ONLY): 16 min   Charges:   PT Evaluation $PT  Eval Moderate Complexity: 1 Mod     PT G Codes:       [name], PT, DPT Acute Rehab Services  Pager: [place] 04/26/2017, 8:23 AM

## 2017-04-26 NOTE — Progress Notes (Signed)
Rolling walker delivered at bedside

## 2017-04-26 NOTE — Progress Notes (Signed)
[facility] ROUNDING NOTE   Subjective:   83 y.o. female with ESRD on TTS HD at [facility] for 12 years , hx PAF, PVD, TIA, HTN , AS, who presented early  this am with syncope  Head CT neg acute findings. EGK stable SR with PVCs and RBBB, CTA angio chest/abd/pelvis shoeed acute nondisplaced right 4th rib fx and thickened gastric antrum which was likely gastritis.       Objective:  Vital signs in last 24 hours:  Temp:  [97.6 F (36.4 C)-98.3 F (36.8 C)] 97.9 F (36.6 C) (12/19 0817) Pulse Rate:  [51-77] 62 (12/19 0817) Resp:  [12-20] 18 (12/19 0817) BP: (99-149)/(34-75) 118/42 (12/19 0817) SpO2:  [95 %-100 %] 95 % (12/19 0817) Weight:  [126 lb (57.2 kg)-133 lb 2.5 oz (60.4 kg)] 126 lb (57.2 kg) (12/19 0430)  Weight change:  Filed Weights   04/25/17 08/22/2106 04/25/17 2125/08/21 04/26/17 0430  Weight: 128 lb 11.2 oz (58.4 kg) 128 lb 11.2 oz (58.4 kg) 126 lb (57.2 kg)    Intake/Output: I/O last 3 completed shifts: In: 220 [P.O.:120; IV Piggyback:100] Out: 1000 [Other:1000]   Intake/Output this shift:  Total I/O In: 120 [P.O.:120] Out: -   CVS- Afib  Systolic murmur  AS RS- CTA ABD- BS present soft non-distended EXT- no edema right lower AVGG + bruit     Basic Metabolic Panel: Recent Labs  Lab 04/25/17 0045 04/25/17 0100 04/25/17 0307 04/26/17 0819  NA 133* 132* 133* 134*  K 5.1 5.0 5.4* 4.7  CL 92* 94* 94* 96*  CO2 29  --  27 27  GLUCOSE 100* 99 91 103*  BUN 24* 26* 25* 9  CREATININE 6.70* 7.10* 7.01* 4.31*  CALCIUM 9.0  --  9.0 8.5*    Liver Function Tests: Recent Labs  Lab 04/25/17 0045  AST 22  ALT 12*  ALKPHOS 59  BILITOT 0.7  PROT 5.8*  ALBUMIN 3.4*   No results for input(s): LIPASE, AMYLASE in the last 168 hours. No results for input(s): AMMONIA in the last 168 hours.  CBC: Recent Labs  Lab 04/24/17 08/21/2009 04/25/17 0100 04/25/17 0307  WBC 5.2  --  4.9  HGB 13.0 11.2* 10.4*  HCT 39.8 33.0* 32.0*  MCV 98.5  --  99.4  PLT 126*  --    --     Cardiac Enzymes: Recent Labs  Lab 04/25/17 0956 04/25/17 08-21-2116 04/26/17 0325  TROPONINI <0.03 0.03* 0.03*    BNP: Invalid input(s): POCBNP  CBG: Recent Labs  Lab 04/24/17 Aug 21, 2013 04/25/17 0729 04/26/17 0644 04/26/17 0741  GLUCAP 101* 83 79 73    Microbiology: Results for orders placed or performed during the hospital encounter of 04/24/17  MRSA PCR Screening     Status: None   Collection Time: 04/25/17  9:35 PM  Result Value Ref Range Status   MRSA by PCR NEGATIVE NEGATIVE Final    Comment:        The GeneXpert MRSA Assay (FDA approved for NASAL specimens only), is one component of a comprehensive MRSA colonization surveillance program. It is not intended to diagnose MRSA infection nor to guide or monitor treatment for MRSA infections.     Coagulation Studies: Recent Labs    04/24/17 2009-08-21 04/25/17 0307 04/26/17 0325  LABPROT 25.9* 28.7* 21.6*  INR 2.39 2.73 1.90    Urinalysis: No results for input(s): COLORURINE, LABSPEC, PHURINE, GLUCOSEU, HGBUR, BILIRUBINUR, KETONESUR, PROTEINUR, UROBILINOGEN, NITRITE, LEUKOCYTESUR in the last 72 hours.  Invalid input(s): APPERANCEUR  Imaging: Ct Head Wo Contrast  Result Date: 04/25/2017 CLINICAL DATA:  Syncopal episode last night. Denies hitting head. Next medial 10/06/2010 EXAM: CT HEAD WITHOUT CONTRAST TECHNIQUE: Contiguous axial images were obtained from the base of the skull through the vertex without intravenous contrast. COMPARISON:  None. FINDINGS: Brain: New new Generalized cerebral atrophy. Periventricular white matter low attenuation likely secondary to microangiopathy. Vascular: Cerebrovascular atherosclerotic calcifications are noted. Skull: Negative for fracture or focal lesion. Sinuses/Orbits: Visualized portions of the orbits are unremarkable. Visualized portions of the paranasal sinuses and mastoid air cells are unremarkable. Other: None. IMPRESSION: 1. No acute intracranial pathology. 2.  Chronic microvascular disease and cerebral atrophy. Electronically Signed   By: Kathreen Devoid   On: 04/25/2017 09:51   Dg Chest Portable 1 View  Result Date: 04/24/2017 CLINICAL DATA:  Pt called out Thursday for hypotension was found to be orthostatic but refused to go to the hospital. Pt Called out today for syncope. EXAM: PORTABLE CHEST 1 VIEW COMPARISON:  Chest x-ray dated 02/09/2017. FINDINGS: Stable cardiomegaly. Aortic atherosclerosis. Chronic fibrosis and/or chronic bronchitic changes at the lung bases, stable. No confluent opacity to suggest a developing pneumonia. No pleural effusion or pneumothorax seen. Osseous structures about the chest are unremarkable. IMPRESSION: 1. No active disease.  No evidence of pneumonia or pulmonary edema. 2. Stable cardiomegaly. 3. Chronic bronchitic changes and/or fibrosis at the lung bases. 4. Aortic atherosclerosis. Electronically Signed   By: Franki Cabot M.D.   On: 04/24/2017 21:00   Ct Angio Chest/abd/pel For Dissection W And/or Wo Contrast  Result Date: 04/24/2017 CLINICAL DATA:  Syncopal episode and fall. Mid abdominal and chest pain. Back injury. History of end-stage renal disease on dialysis, hypertension, RIGHT brachiocephalic pseudo aneurysm. EXAM: CT ANGIOGRAPHY CHEST, ABDOMEN AND PELVIS TECHNIQUE: Multidetector CT imaging through the chest, abdomen and pelvis was performed using the standard protocol during bolus administration of intravenous contrast. Multiplanar reconstructed images and MIPs were obtained and reviewed to evaluate the vascular anatomy. CONTRAST:  <See Chart> ISOVUE-370 IOPAMIDOL (ISOVUE-370) INJECTION 76% COMPARISON:  Chest radiograph April 24, 2017 at 2022 hours and CT abdomen and pelvis February 09, 2018 FINDINGS: CTA CHEST FINDINGS CARDIOVASCULAR: Thoracic aorta is normal course and caliber. Moderate to severe calcific atherosclerosis aortic arch. No intrinsic density on noncontrast CT. Homogeneous contrast opacification of  thoracic aorta without dissection, aneurysm, luminal irregularity, periaortic fluid collections, or contrast extravasation. The heart is mildly enlarged. Moderate coronary artery calcifications. No pericardial effusion. Moderate stenosis brachiocephalic confluence with turbulent blood flow in heterogeneous contrast opacification. Though not tailored for evaluation. No central pulmonary emboli. MEDIASTINUM/NODES: No mediastinal mass or lymphadenopathy by CT size criteria. LUNGS/PLEURA: Tracheobronchial tree is patent, no pneumothorax. Bilateral lower lobe atelectasis. No pleural effusions, focal consolidations, pulmonary nodules or masses. MUSCULOSKELETAL: Respiratory motion versus nondisplaced RIGHT posterolateral fourth rib fracture. Osteopenia. Large LEFT subacromial bursal collection. Small RIGHT subacromial bursal collection. Old sternal fracture. Severe degenerative change of thoracic spine. Review of the MIP images confirms the above findings. CTA ABDOMEN AND PELVIS FINDINGS VASCULAR Aorta: Abdominal aorta is normal course and caliber. , moderate calcific atherosclerosis. Homogeneous contrast opacification of aortoiliac vessels without dissection, aneurysm, luminal irregularity, periaortic fluid collections, or contrast extravasation. Celiac: Patent. SMA: Patent. Renals: Patent. Moderate stenosis RIGHT renal artery. Absent LEFT renal artery. IMA: Patent. Inflow: Negative. Veins: Negative, not tailored for evaluation. Review of the MIP images confirms the above findings. NON-VASCULAR HEPATOBILIARY: Re- demonstration of multiple hepatic, and scattered hepatic calcifications. Normal gallbladder. PANCREAS: Normal. SPLEEN: Normal. ADRENALS/URINARY TRACT: Absent  RIGHT kidney. Extremely atrophic RIGHT kidney with 15 mm cyst. Urinary bladder is decompressed. STOMACH/BOWEL: Thickened, irregular gastric antrum. Mildly hyperemic proximal duodenum. The small and large bowel are normal in course and caliber without  inflammatory changes, sensitivity decreased without oral contrast. Proximal small bowel lipoma, unchanged. Severe descending and sigmoid colonic diverticulosis. Additional scattered colonic diverticula. Moderate amount of retained large bowel stool. VASCULAR/LYMPHATIC: No lymphadenopathy by CT size criteria. REPRODUCTIVE: Normal. OTHER: No intraperitoneal free fluid or free air. MUSCULOSKELETAL: Nonacute. Stable acetabular subchondral cysts. Osteopenia. Severe degenerative change of lumbar spine with grade 1 L4-5 anterolisthesis on degenerative basis. Mild broad lumbar levoscoliosis. Review of the MIP images confirms the above findings. IMPRESSION: CTA CHEST: 1. No acute vascular process. 2. Moderate cardiomegaly.  Bilateral atelectasis. 3. Acute nondisplaced RIGHT fourth rib fracture versus respiratory motion artifact. CTA ABDOMEN AND PELVIS: 1. No acute vascular process. 2. Thickened irregular gastric antrum most consistent with gastritis new from October 2018. 3. Atrophic RIGHT kidney and absent LEFT kidney. Aortic Atherosclerosis (ICD10-I70.0). Electronically Signed   By: Elon Alas M.D.   On: 04/24/2017 22:43     Medications:   . sodium chloride    . sodium chloride    . sodium chloride    . famotidine (PEPCID) IV Stopped (04/26/17 1022)   . amiodarone  100 mg Oral QHS  . cinacalcet  30 mg Oral Q breakfast  . doxercalciferol  2 mcg Intravenous Q T,Th,Sa-HD  . feeding supplement (NEPRO CARB STEADY)  237 mL Oral BID BM  . multivitamin  1 tablet Oral Daily  . sodium chloride flush  3 mL Intravenous Q12H  . sodium chloride flush  3 mL Intravenous Q12H  . warfarin  1 mg Oral ONCE-1800  . Warfarin - Pharmacist Dosing Inpatient   Does not apply q1800   sodium chloride, sodium chloride, sodium chloride, acetaminophen **OR** acetaminophen, bisacodyl, heparin, heparin, HYDROmorphone (DILAUDID) injection, lidocaine (PF), lidocaine-prilocaine, ondansetron **OR** ondansetron (ZOFRAN) IV,  oxyCODONE, pentafluoroprop-tetrafluoroeth, senna-docusate, sodium chloride flush  Assessment/ Plan:  1. Syncopal episode with fall and fx right 4th rib  For Echo, trending troponins Heart rate now in the 50s noted to be lower than usual - outpatient pulse which ranges from 70 - 90s. Watch for further  Bradycardia - cards following 2. ESRD -  TTS HD 3. BP/volume controlled 4. Anemia  - hgb down to 10.4 - was 12.1 12/13 - if remains low - redsume ESA Thursday 5. Metabolic bone disease -  Continue hectorol/sensipar 6. Nutrition - renal diet/vitamin - add nepro 7. Drop in Access flow 12/13 from baseline-watch for recirculation - evaluate for  f/u with [facility] after discharge 8. PAF on chronic coumadin - INR therapeutic 9. Severe AS- pt previously declined TAVR 10. Thrombocytopenia - 126 plts - follow -      LOS: 1 Kimiya Brunelle W @TODAY @10 :56 AM

## 2017-05-03 ENCOUNTER — Ambulatory Visit (INDEPENDENT_AMBULATORY_CARE_PROVIDER_SITE_OTHER): Payer: Medicare Other | Admitting: Pharmacist Clinician (PhC)/ Clinical Pharmacy Specialist

## 2017-05-03 ENCOUNTER — Telehealth: Payer: Self-pay | Admitting: Cardiology

## 2017-05-03 DIAGNOSIS — Z5181 Encounter for therapeutic drug level monitoring: Secondary | ICD-10-CM

## 2017-05-03 DIAGNOSIS — I4891 Unspecified atrial fibrillation: Secondary | ICD-10-CM | POA: Diagnosis not present

## 2017-05-03 DIAGNOSIS — Z7901 Long term (current) use of anticoagulants: Secondary | ICD-10-CM | POA: Diagnosis not present

## 2017-05-03 LAB — POCT INR: INR: 1.7

## 2017-05-03 NOTE — Telephone Encounter (Signed)
Patient in office for INR check today, will have next drawn at dialysis on Jan 9.  Request faxed to [facility] dialysis

## 2017-05-03 NOTE — Patient Instructions (Signed)
Description   Take 2 tablets today Wednesday Dec 26, then continue with 3.75 mg daily.  Repeat INR in 2 weeks at dialysis Order faxed to dialysis at [phone]

## 2017-05-03 NOTE — Telephone Encounter (Signed)
[name] from [facility] you to know that her INR was due on last Friday and she did not have it. Pt needs appt with [facility].

## 2017-05-04 LAB — PROTIME-INR

## 2017-05-05 ENCOUNTER — Other Ambulatory Visit: Payer: Self-pay | Admitting: Internal Medicine

## 2017-05-05 ENCOUNTER — Ambulatory Visit
Admission: RE | Admit: 2017-05-05 | Discharge: 2017-05-05 | Disposition: A | Discharge status: Home / Self Care | Payer: Medicare Other | Source: Ambulatory Visit | Attending: Internal Medicine | Admitting: Internal Medicine

## 2017-05-05 DIAGNOSIS — T1490XA Injury, unspecified, initial encounter: Secondary | ICD-10-CM

## 2017-05-16 LAB — PROTIME-INR: INR: 2.4 — AB (ref 0.9–1.1)

## 2017-05-18 ENCOUNTER — Ambulatory Visit (INDEPENDENT_AMBULATORY_CARE_PROVIDER_SITE_OTHER): Payer: Medicare Other | Admitting: Pharmacist

## 2017-05-18 ENCOUNTER — Encounter: Payer: Self-pay | Admitting: Pharmacist

## 2017-05-18 DIAGNOSIS — Z7901 Long term (current) use of anticoagulants: Secondary | ICD-10-CM

## 2017-05-18 DIAGNOSIS — I4891 Unspecified atrial fibrillation: Secondary | ICD-10-CM

## 2017-05-18 DIAGNOSIS — Z5181 Encounter for therapeutic drug level monitoring: Secondary | ICD-10-CM | POA: Diagnosis not present

## 2017-06-06 ENCOUNTER — Other Ambulatory Visit: Payer: Self-pay | Admitting: Cardiology

## 2017-07-07 ENCOUNTER — Ambulatory Visit (INDEPENDENT_AMBULATORY_CARE_PROVIDER_SITE_OTHER): Payer: Medicare Other | Admitting: Pharmacist

## 2017-07-07 ENCOUNTER — Encounter: Payer: Self-pay | Admitting: Pharmacist

## 2017-07-07 DIAGNOSIS — I4891 Unspecified atrial fibrillation: Secondary | ICD-10-CM

## 2017-07-07 DIAGNOSIS — Z7901 Long term (current) use of anticoagulants: Secondary | ICD-10-CM | POA: Diagnosis not present

## 2017-07-07 DIAGNOSIS — Z5181 Encounter for therapeutic drug level monitoring: Secondary | ICD-10-CM | POA: Diagnosis not present

## 2017-07-07 LAB — PROTIME-INR: INR: 1.9 — AB (ref 0.9–1.1)

## 2017-08-07 ENCOUNTER — Encounter (INDEPENDENT_AMBULATORY_CARE_PROVIDER_SITE_OTHER): Payer: Self-pay

## 2017-08-07 ENCOUNTER — Telehealth: Payer: Self-pay | Admitting: Pharmacist

## 2017-08-07 NOTE — Telephone Encounter (Signed)
No INR results received since 07/07/2017.    Talked to [employer] at dialysis center [facility]). She stated they never received results from lab from INR done 08/01/2017 .  Will repeat INR tomorrow 08/08/2017 and weekly x3.  Verbal order given to RN today. Will fax written orders as well and follow up in 4 days.   Patient informed of current plan.

## 2017-08-08 LAB — PROTIME-INR: INR: 2.5 — AB (ref 0.9–1.1)

## 2017-08-10 ENCOUNTER — Encounter (INDEPENDENT_AMBULATORY_CARE_PROVIDER_SITE_OTHER): Payer: Self-pay

## 2017-08-10 ENCOUNTER — Ambulatory Visit (INDEPENDENT_AMBULATORY_CARE_PROVIDER_SITE_OTHER): Payer: Medicare Other | Admitting: Pharmacist

## 2017-08-10 DIAGNOSIS — Z7901 Long term (current) use of anticoagulants: Secondary | ICD-10-CM | POA: Diagnosis not present

## 2017-08-10 DIAGNOSIS — Z5181 Encounter for therapeutic drug level monitoring: Secondary | ICD-10-CM

## 2017-08-10 DIAGNOSIS — I4891 Unspecified atrial fibrillation: Secondary | ICD-10-CM

## 2017-08-16 LAB — PROTIME-INR: INR: 2 — AB (ref 0.9–1.1)

## 2017-08-18 ENCOUNTER — Ambulatory Visit (INDEPENDENT_AMBULATORY_CARE_PROVIDER_SITE_OTHER): Payer: Medicare Other | Admitting: Pharmacist

## 2017-08-18 ENCOUNTER — Encounter (INDEPENDENT_AMBULATORY_CARE_PROVIDER_SITE_OTHER): Payer: Self-pay

## 2017-08-18 DIAGNOSIS — Z7901 Long term (current) use of anticoagulants: Secondary | ICD-10-CM

## 2017-08-18 DIAGNOSIS — Z5181 Encounter for therapeutic drug level monitoring: Secondary | ICD-10-CM | POA: Diagnosis not present

## 2017-08-18 DIAGNOSIS — I4891 Unspecified atrial fibrillation: Secondary | ICD-10-CM

## 2017-08-22 ENCOUNTER — Emergency Department (HOSPITAL_COMMUNITY)
Admission: EM | Admit: 2017-08-22 | Discharge: 2017-08-22 | Disposition: A | Discharge status: Home / Self Care | Payer: Medicare Other | Attending: Emergency Medicine | Admitting: Emergency Medicine

## 2017-08-22 ENCOUNTER — Other Ambulatory Visit: Payer: Self-pay

## 2017-08-22 ENCOUNTER — Encounter (HOSPITAL_COMMUNITY): Payer: Self-pay | Admitting: Emergency Medicine

## 2017-08-22 DIAGNOSIS — N186 End stage renal disease: Secondary | ICD-10-CM | POA: Diagnosis not present

## 2017-08-22 DIAGNOSIS — Y828 Other medical devices associated with adverse incidents: Secondary | ICD-10-CM | POA: Diagnosis not present

## 2017-08-22 DIAGNOSIS — Z7901 Long term (current) use of anticoagulants: Secondary | ICD-10-CM | POA: Diagnosis not present

## 2017-08-22 DIAGNOSIS — T82838A Hemorrhage of vascular prosthetic devices, implants and grafts, initial encounter: Secondary | ICD-10-CM

## 2017-08-22 DIAGNOSIS — Z8673 Personal history of transient ischemic attack (TIA), and cerebral infarction without residual deficits: Secondary | ICD-10-CM | POA: Insufficient documentation

## 2017-08-22 DIAGNOSIS — Z79899 Other long term (current) drug therapy: Secondary | ICD-10-CM | POA: Diagnosis not present

## 2017-08-22 DIAGNOSIS — I120 Hypertensive chronic kidney disease with stage 5 chronic kidney disease or end stage renal disease: Secondary | ICD-10-CM | POA: Insufficient documentation

## 2017-08-22 DIAGNOSIS — Z992 Dependence on renal dialysis: Secondary | ICD-10-CM | POA: Insufficient documentation

## 2017-08-22 NOTE — ED Notes (Signed)
Steri-strip and benzoin applied by Dr. Vanita Panda and manual BP cuff deflated. Bleeding stopped at this time. Will continue to monitor.

## 2017-08-22 NOTE — Discharge Instructions (Addendum)
As discussed, your evaluation today has been largely reassuring.  But, it is important that you monitor your condition carefully, and do not hesitate to return to the ED if you develop new, or concerning changes in your condition. ? ?Otherwise, please follow-up with your physician for appropriate ongoing care. ? ?

## 2017-08-22 NOTE — ED Triage Notes (Signed)
Pt arrived EMS from dialysis after having her full dialysis treatment today. EMS reports that her dialysis center states that her RFA fistula would not stop bleeding after her treatment today. PTA they went through several dressings due to bleeding.

## 2017-08-22 NOTE — ED Notes (Signed)
Manual BP cuff applied as tourniquet. EDP at bedside.

## 2017-08-22 NOTE — ED Provider Notes (Signed)
[facility] EMERGENCY DEPARTMENT Provider Note   CSN: [id number] Arrival date & time: 08/22/17  [place]     History   Chief Complaint Chief Complaint  Patient presents with  . Vascular Access Problem    HPI [name] is a 84 y.o. female.  HPI Patient presents from dialysis due to concern of a persistently bleeding dialysis shunt. The patient herself states that she feels generally well, but EMS providers to bring the patient notes that since removal of the dialysis device patient has had pulsatile bleeding from the right forearm. No reported hemodynamic changes in route, and bleeding was controlled only with direct pressure application, which has persisted through to transfer to our emergency department gurney. Patient denies other recent changes in her health.  Past Medical History:  Diagnosis Date  . Anemia   . Aortic stenosis   . Benign bladder mass   . Chronic cough   . DJD (degenerative joint disease)   . Dyslipidemia   . ESRD on hemodialysis ([place])    "TTS; [facility]" (09/27/2016)  . Gout   . HTN (hypertension)   . Obstructive sleep apnea    mild  . Orthostatic syncope    around year 2006  . Pneumonia   . TIA (transient ischemic attack)     Patient Active Problem List   Diagnosis Date Noted  . Syncope 04/25/2017  . Gastritis 04/25/2017  . Closed fracture of one rib of right side   . Hypotension 02/10/2017  . Lactic acidosis 02/10/2017  . Bradycardia 10/29/2016  . Acute respiratory failure ([place]) 09/27/2016  . Atypical chest pain 09/27/2016  . Essential hypertension 09/27/2016  . Acute pulmonary edema ([facility])   . Other chest pain   . Coumadin Rx 09/07/2016  . Monitoring for long-term anticoagulant use 08/29/2016  . Atrial flutter ([place])   . PAF (paroxysmal atrial fibrillation) ([place]) 08/22/2016  . Atrial fibrillation with RVR ([place]) 08/20/2016  . Severe aortic stenosis 08/20/2016  . New onset a-fib ([place]) 08/20/2016  .  Staphylococcus aureus bacteremia   . 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 Bleeding from dialysis shunt ([place])   . Nausea   . Fever in adult 11/27/2015  . Hemorrhage of arteriovenous graft ([place]) 11/27/2015  . HLD (hyperlipidemia) 11/27/2015  . Bleeding 11/27/2015  . Influenza A with respiratory manifestations 05/08/2013  . Cough 05/08/2013  . Chronic cough 02/15/2013  . Secondary hyperparathyroidism ([place]) 11/21/2012  . ESRD on dialysis [facility]) 11/20/2012    Past Surgical History:  Procedure Laterality Date  . BONE MARROW BIOPSY    . CATARACT EXTRACTION W/ INTRAOCULAR LENS  IMPLANT, BILATERAL    . COLONOSCOPY W/ BIOPSIES AND POLYPECTOMY    . left arm dialysis graft    . NEPHRECTOMY     left  . REVISION OF ARTERIOVENOUS GORETEX GRAFT Right 11/24/2014   Procedure: REPLACEMENT OF  MEDIAL SIDE OF RIGHT FORARM ARTERIOVENOUS GORETEX GRAFT;  Surgeon: Conrad Basile, MD;  Location: [facility];  Service: Vascular;  Laterality: Right;  . REVISION OF ARTERIOVENOUS GORETEX GRAFT Right 11/28/2015   Procedure: REVISION OF RIGHT ARM ARTERIOVENOUS GORETEX GRAFT;  Surgeon: Serafina Mitchell, MD;  Location: [facility];  Service: Vascular;  Laterality: Right;  . TOTAL ABDOMINAL HYSTERECTOMY       OB History   None      Home Medications    Prior to Admission medications   Medication Sig Start Date End Date Taking? Authorizing Provider  acetaminophen (TYLENOL)  500 MG tablet Take 500 mg by mouth every 6 (six) hours as needed.    [provider]  amiodarone (PACERONE) 200 MG tablet Take 0.5 tablets (100 mg total) by mouth daily. Patient taking differently: Take 100 mg by mouth at bedtime.  12/14/16   Barrett, Evelene Croon, PA-C  famotidine (PEPCID) 20 MG tablet Take 1 tablet (20 mg total) by mouth daily. 04/27/17   Regalado, Belkys A, MD  multivitamin (RENA-VIT) TABS tablet Take 1 tablet by mouth daily.    [provider]  Nutritional Supplements  (FEEDING SUPPLEMENT, NEPRO CARB STEADY,) LIQD Take 237 mLs by mouth 2 (two) times daily between meals. 02/10/17   Mariel Aloe, MD  oxyCODONE (OXY IR/ROXICODONE) 5 MG immediate release tablet Take 0.5 tablets (2.5 mg total) by mouth every 6 (six) hours as needed for moderate pain. 04/26/17   Regalado, Belkys A, MD  SENSIPAR 30 MG tablet Take 30 mg by mouth at bedtime.  02/14/13   [provider]  warfarin (COUMADIN) 2.5 MG tablet TAKE 1 TO 1&1/2 TABLETS DAILY AS DIRECTED BY COUMADIN CLINIC. 06/06/17   Minus Breeding, MD  chlorpheniramine (CHLOR-TRIMETON) 4 MG tablet One pill at night as needed for cough 12/22/10 07/20/11  Chesley Mires, MD    Family History Family History  Problem Relation Age of Onset  . Hypertension Mother   . Diabetes Mother   . Hypertension Other     Social History Social History   Tobacco Use  . Smoking status: Never Smoker  . Smokeless tobacco: Never Used  Substance Use Topics  . Alcohol use: No    Alcohol/week: 0.0 oz  . Drug use: No     Allergies   Tape and Sulfa antibiotics   Review of Systems Review of Systems  Constitutional:       Per HPI, otherwise negative  HENT:       Per HPI, otherwise negative  Respiratory:       Per HPI, otherwise negative  Cardiovascular:       Per HPI, otherwise negative  Gastrointestinal: Negative for vomiting.  Endocrine:       Negative aside from HPI  Genitourinary:       Neg aside from HPI   Musculoskeletal:       Per HPI, otherwise negative  Skin: Positive for wound.  Allergic/Immunologic: Positive for immunocompromised state.  Neurological: Negative for syncope.     Physical Exam Updated Vital Signs BP (!) 139/38 (BP Location: Left Arm)   Pulse 64   Temp 98.3 F (36.8 C) (Oral)   Resp 18   Ht 5' 3"  (1.6 m)   Wt 55.8 kg (123 lb)   SpO2 97%   BMI 21.79 kg/m   Physical Exam  Constitutional: She is oriented to person, place, and time. She appears well-developed and well-nourished. No  distress.  HENT:  Head: Normocephalic and atraumatic.  Eyes: Conjunctivae and EOM are normal.  Cardiovascular: Normal rate, regular rhythm and intact distal pulses.  Pulmonary/Chest: Effort normal and breath sounds normal. No stridor. No respiratory distress.  Abdominal: She exhibits no distension.  Musculoskeletal: She exhibits no edema.  Right AV fistula with small open wound in the distal aspect  Neurological: She is alert and oriented to person, place, and time. No cranial nerve deficit.  Skin: Skin is warm and dry.  Psychiatric: She has a normal mood and affect.  Nursing note and vitals reviewed.    ED Treatments / Results   Procedures Procedures (including critical  care time)  Immediately after the initial evaluation we applied a manual blood pressure cuff to the patient's right arm. After this was done, the wound was cleaned, and I was able to appreciate a small wound on the distal fistula area. Subsequently, the wound was cleaned, and pressure was applied with Steri-Strips, manual pressure. Subsequently, the blood pressure cuff was removed, and the patient had no recurrence of her bleeding.  6:04 PM Patient has had no bleeding now for several hours of monitoring, will be discharged home. Absent other complaints, patient is appropriate for outpatient follow-up.  LACERATION REPAIR Performed by: Carmin Muskrat Authorized by: Carmin Muskrat Consent: Verbal consent obtained. Risks and benefits: risks, benefits and alternatives were discussed Consent given by: patient Patient identity confirmed: provided demographic data Prepped and Draped in normal sterile fashion Wound explored  Laceration Location: Right AV hemodialysis fistula  Laceration Length: 1cm  No Foreign Bodies seen or palpated    Irrigation method: syringe Amount of cleaning: standard  Skin closure: steri-strip w direct pressure  Number of strips 1  Patient tolerance: Patient tolerated the  procedure well with no immediate complications.    Final Clinical Impressions(s) / ED Diagnoses   Final diagnoses:  Bleeding from dialysis shunt, initial encounter [facility])      Carmin Muskrat, MD 08/22/17 1806

## 2017-08-29 LAB — PROTIME-INR: INR: 2.1 — AB (ref 0.9–1.1)

## 2017-08-30 ENCOUNTER — Ambulatory Visit (INDEPENDENT_AMBULATORY_CARE_PROVIDER_SITE_OTHER): Payer: Medicare Other | Admitting: Pharmacist

## 2017-08-30 DIAGNOSIS — Z5181 Encounter for therapeutic drug level monitoring: Secondary | ICD-10-CM | POA: Diagnosis not present

## 2017-08-30 DIAGNOSIS — I4891 Unspecified atrial fibrillation: Secondary | ICD-10-CM

## 2017-08-30 DIAGNOSIS — Z7901 Long term (current) use of anticoagulants: Secondary | ICD-10-CM

## 2017-09-03 ENCOUNTER — Other Ambulatory Visit: Payer: Self-pay | Admitting: Cardiology

## 2017-09-05 LAB — PROTIME-INR

## 2017-09-14 NOTE — Progress Notes (Signed)
Cardiology Office Note   Date:  09/15/2017   ID:  [name], DOB 1921/07/26, MRN [id number]  PCP:  Levin Erp, MD  Cardiologist:   Minus Breeding, MD   Chief Complaint  Patient presents with  . Atrial Fibrillation     History of Present Illness: [name] is a 84 y.o. female who presents for evaluation of aortic stenosis and atrial fib.  she was admitted last last year with hypotension.  She was sent home on Midodrine.  She never took this.  Her blood pressure has been fine.  She has been doing well with dialysis.  She chronically sleeps in her recliner.  She watches salt and fluid.  She denies any chest pressure, neck or arm discomfort.  She does not notice atrial fibrillation.  She has no palpitations, presyncope or syncope.   Past Medical History:  Diagnosis Date  . Anemia   . Aortic stenosis   . Benign bladder mass   . Chronic cough   . DJD (degenerative joint disease)   . Dyslipidemia   . ESRD on hemodialysis ([place])    "TTS; [place]" (09/27/2016)  . Gout   . HTN (hypertension)   . Obstructive sleep apnea    mild  . Orthostatic syncope    around year 2006  . Pneumonia   . TIA (transient ischemic attack)     Past Surgical History:  Procedure Laterality Date  . BONE MARROW BIOPSY    . CATARACT EXTRACTION W/ INTRAOCULAR LENS  IMPLANT, BILATERAL    . COLONOSCOPY W/ BIOPSIES AND POLYPECTOMY    . left arm dialysis graft    . NEPHRECTOMY     left  . REVISION OF ARTERIOVENOUS GORETEX GRAFT Right 11/24/2014   Procedure: REPLACEMENT OF  MEDIAL SIDE OF RIGHT FORARM ARTERIOVENOUS GORETEX GRAFT;  Surgeon: Conrad Provo, MD;  Location: [place];  Service: Vascular;  Laterality: Right;  . REVISION OF ARTERIOVENOUS GORETEX GRAFT Right 11/28/2015   Procedure: REVISION OF RIGHT ARM ARTERIOVENOUS GORETEX GRAFT;  Surgeon: Serafina Mitchell, MD;  Location: [place];  Service: Vascular;  Laterality: Right;  . TOTAL ABDOMINAL HYSTERECTOMY       Current Outpatient  Medications  Medication Sig Dispense Refill  . acetaminophen (TYLENOL) 500 MG tablet Take 500 mg by mouth every 6 (six) hours as needed.    Marland Kitchen amiodarone (PACERONE) 200 MG tablet Take 0.5 tablets (100 mg total) by mouth at bedtime. 90 tablet 3  . multivitamin (RENA-VIT) TABS tablet Take 1 tablet by mouth daily.    . Nutritional Supplements (FEEDING SUPPLEMENT, NEPRO CARB STEADY,) LIQD Take 237 mLs by mouth 2 (two) times daily between meals. 60 Can 0  . SENSIPAR 30 MG tablet Take 30 mg by mouth at bedtime.     Marland Kitchen warfarin (COUMADIN) 2.5 MG tablet TAKE 1 ANS 1/2 TABLETS DAILY OR AS DIRECTED BY COUMADIN CLINIC 135 tablet 1   No current facility-administered medications for this visit.     Allergies:   Tape and Sulfa antibiotics    ROS:  Please see the history of present illness.   Otherwise, review of systems are positive for none.   All other systems are reviewed and negative.    PHYSICAL EXAM: VS:  BP (!) 127/55   Pulse 89   Ht _0  (1.6 m)   Wt 131 lb (59.4 kg)   SpO2 97%   BMI 23.21 kg/m  , BMI Body mass index is 23.21 kg/m.  GENERAL:  Well appearing for her age NECK:  No jugular venous distention, waveform within normal limits, carotid upstroke brisk and symmetric, no bruits, no thyromegaly LUNGS:  Clear to auscultation bilaterally CHEST:  Unremarkable HEART:  PMI not displaced or sustained,S1 and S2 within normal limits, no S3, no S4, no clicks, no rubs, 3 out of 6 apical systolic murmur radiating slightly at the aortic outflow tract murmurs ABD:  Flat, positive bowel sounds normal in frequency in pitch, no bruits, no rebound, no guarding, no midline pulsatile mass, no hepatomegaly, no splenomegaly EXT:  2 plus pulses throughout, no edema, no cyanosis no clubbing, right forearm dialysis fistula with slight pulsatile mass and bruit.     EKG:  EKG is not   ordered today.   Recent Labs: 09/27/2016: B Natriuretic Peptide 1,532.2; Magnesium 2.2 04/24/2017: Platelets  126 04/25/2017: ALT 12; Hemoglobin 10.4 04/26/2017: BUN 9; Creatinine, Ser 4.31; Potassium 4.7; Sodium 134   Lab Results  Component Value Date   TSH 2.400 08/20/2016   ALT 12 (L) 04/25/2017   AST 22 04/25/2017   ALKPHOS 59 04/25/2017   BILITOT 0.7 04/25/2017   PROT 5.8 (L) 04/25/2017   ALBUMIN 3.4 (L) 04/25/2017    Lipid Panel    Component Value Date/Time   CHOL 143 08/21/2016 0429   TRIG 62 08/21/2016 0429   HDL 68 08/21/2016 0429   CHOLHDL 2.1 08/21/2016 0429   VLDL 12 08/21/2016 0429   LDLCALC 63 08/21/2016 0429     Lab Results  Component Value Date   TSH 2.400 08/20/2016   ALT 12 (L) 04/25/2017   AST 22 04/25/2017   ALKPHOS 59 04/25/2017   BILITOT 0.7 04/25/2017   PROT 5.8 (L) 04/25/2017   ALBUMIN 3.4 (L) 04/25/2017     Wt Readings from Last 3 Encounters:  09/15/17 131 lb (59.4 kg)  08/22/17 123 lb (55.8 kg)  04/26/17 126 lb (57.2 kg)      Other studies Reviewed: Additional studies/ records that were reviewed today include:  None Review of the above records demonstrates:      ASSESSMENT AND PLAN:  AS:   She does have severe AS but is controlled with dialysis.  She is not interested in aggressive therapy like TAVR.  I will continue to manage this medically.      ATRIAL FIB:   [name] has a CHA2DS2 - VASc score of 6.   She will continue warfarin.   PULMONARY HTN:   I will manage this symptomatically.      RBBB:   She does have bradycardia.  Metoprolol was stopped previously.  No change in therapy.   HTN:  She has had severe hypotension.  No further med titration.    Current medicines are reviewed at length with the patient today.  The patient does not have concerns regarding medicines.  The following changes have been made:  None  Labs/ tests ordered today include:  TSH to be done at renal  No orders of the defined types were placed in this encounter.    Disposition:   FU with me in 12 months.     Signed, Minus Breeding, MD   09/15/2017 10:04 AM    [facility]

## 2017-09-15 ENCOUNTER — Ambulatory Visit (INDEPENDENT_AMBULATORY_CARE_PROVIDER_SITE_OTHER): Payer: Medicare Other | Admitting: Cardiology

## 2017-09-15 ENCOUNTER — Encounter: Payer: Self-pay | Admitting: Cardiology

## 2017-09-15 VITALS — BP 127/55 | HR 89 | Ht 63.0 in | Wt 131.0 lb

## 2017-09-15 DIAGNOSIS — N186 End stage renal disease: Secondary | ICD-10-CM | POA: Diagnosis not present

## 2017-09-15 DIAGNOSIS — I350 Nonrheumatic aortic (valve) stenosis: Secondary | ICD-10-CM | POA: Diagnosis not present

## 2017-09-15 DIAGNOSIS — I959 Hypotension, unspecified: Secondary | ICD-10-CM | POA: Diagnosis not present

## 2017-09-15 DIAGNOSIS — I4891 Unspecified atrial fibrillation: Secondary | ICD-10-CM | POA: Diagnosis not present

## 2017-09-15 MED ORDER — AMIODARONE HCL 200 MG PO TABS: 100.0000 mg | ORAL_TABLET | Freq: Every day | ORAL | 3 refills | Status: DC

## 2017-09-15 NOTE — Patient Instructions (Signed)
Medication Instructions:  Continue current medications  If you need a refill on your cardiac medications before your next appointment, please call your pharmacy.  Labwork: Please get TSH done at dialysis  Testing/Procedures: None Ordered   Follow-Up: Your physician wants you to follow-up in: 1 Year. You should receive a reminder letter in the mail two months in advance. If you do not receive a letter, please call our office [phone].      Thank you for choosing [facility] at [facility]!!

## 2017-09-19 LAB — PROTIME-INR: INR: 2.3 — AB (ref 0.9–1.1)

## 2017-09-21 ENCOUNTER — Ambulatory Visit (INDEPENDENT_AMBULATORY_CARE_PROVIDER_SITE_OTHER): Payer: Medicare Other | Admitting: Pharmacist Clinician (PhC)/ Clinical Pharmacy Specialist

## 2017-09-21 ENCOUNTER — Encounter (INDEPENDENT_AMBULATORY_CARE_PROVIDER_SITE_OTHER): Payer: Self-pay

## 2017-09-21 DIAGNOSIS — Z5181 Encounter for therapeutic drug level monitoring: Secondary | ICD-10-CM | POA: Diagnosis not present

## 2017-09-21 DIAGNOSIS — I4891 Unspecified atrial fibrillation: Secondary | ICD-10-CM | POA: Diagnosis not present

## 2017-09-21 DIAGNOSIS — Z7901 Long term (current) use of anticoagulants: Secondary | ICD-10-CM | POA: Diagnosis not present

## 2017-09-27 LAB — PROTIME-INR

## 2017-10-04 LAB — PROTIME-INR: INR: 2.7 — AB (ref 0.9–1.1)

## 2017-10-04 LAB — POCT INR: INR: 2.7 (ref 2.0–3.0)

## 2017-10-05 ENCOUNTER — Ambulatory Visit (INDEPENDENT_AMBULATORY_CARE_PROVIDER_SITE_OTHER): Payer: Medicare Other | Admitting: Pharmacist

## 2017-10-05 ENCOUNTER — Encounter (INDEPENDENT_AMBULATORY_CARE_PROVIDER_SITE_OTHER): Payer: Self-pay

## 2017-10-05 DIAGNOSIS — I4891 Unspecified atrial fibrillation: Secondary | ICD-10-CM

## 2017-10-05 DIAGNOSIS — Z7901 Long term (current) use of anticoagulants: Secondary | ICD-10-CM | POA: Diagnosis not present

## 2017-10-05 DIAGNOSIS — Z5181 Encounter for therapeutic drug level monitoring: Secondary | ICD-10-CM

## 2017-10-09 ENCOUNTER — Ambulatory Visit
Admission: RE | Admit: 2017-10-09 | Discharge: 2017-10-09 | Disposition: A | Discharge status: Home / Self Care | Payer: Medicare Other | Source: Ambulatory Visit | Attending: Internal Medicine | Admitting: Internal Medicine

## 2017-10-09 ENCOUNTER — Other Ambulatory Visit: Payer: Self-pay | Admitting: Internal Medicine

## 2017-10-09 DIAGNOSIS — I509 Heart failure, unspecified: Secondary | ICD-10-CM

## 2017-11-30 LAB — PROTIME-INR: INR: 1.9 — AB (ref 0.9–1.1)

## 2017-12-04 ENCOUNTER — Encounter: Payer: Self-pay | Admitting: Pharmacist Clinician (PhC)/ Clinical Pharmacy Specialist

## 2017-12-04 ENCOUNTER — Ambulatory Visit (INDEPENDENT_AMBULATORY_CARE_PROVIDER_SITE_OTHER): Payer: Medicare Other | Admitting: Pharmacist Clinician (PhC)/ Clinical Pharmacy Specialist

## 2017-12-04 DIAGNOSIS — I4891 Unspecified atrial fibrillation: Secondary | ICD-10-CM | POA: Diagnosis not present

## 2017-12-04 DIAGNOSIS — Z7901 Long term (current) use of anticoagulants: Secondary | ICD-10-CM | POA: Diagnosis not present

## 2017-12-04 DIAGNOSIS — Z5181 Encounter for therapeutic drug level monitoring: Secondary | ICD-10-CM

## 2017-12-19 LAB — PROTIME-INR: INR: 2.3 — AB (ref <=1.1)

## 2017-12-21 ENCOUNTER — Ambulatory Visit (INDEPENDENT_AMBULATORY_CARE_PROVIDER_SITE_OTHER): Payer: Medicare Other | Admitting: Pharmacist

## 2017-12-21 DIAGNOSIS — Z5181 Encounter for therapeutic drug level monitoring: Secondary | ICD-10-CM

## 2017-12-21 DIAGNOSIS — I4891 Unspecified atrial fibrillation: Secondary | ICD-10-CM

## 2017-12-21 DIAGNOSIS — Z7901 Long term (current) use of anticoagulants: Secondary | ICD-10-CM | POA: Diagnosis not present

## 2017-12-21 LAB — PROTIME-INR

## 2018-01-04 LAB — PROTIME-INR: INR: 2.1 — AB (ref 0.9–1.1)

## 2018-01-10 ENCOUNTER — Ambulatory Visit
Admission: RE | Admit: 2018-01-10 | Discharge: 2018-01-10 | Disposition: A | Discharge status: Home / Self Care | Payer: Medicare Other | Source: Ambulatory Visit | Attending: Internal Medicine | Admitting: Internal Medicine

## 2018-01-10 ENCOUNTER — Other Ambulatory Visit: Payer: Self-pay | Admitting: Internal Medicine

## 2018-01-10 DIAGNOSIS — M25561 Pain in right knee: Secondary | ICD-10-CM

## 2018-02-01 LAB — PROTIME-INR: INR: 2.1 — AB (ref 0.9–1.1)

## 2018-02-05 ENCOUNTER — Encounter: Payer: Self-pay | Admitting: Pharmacist Clinician (PhC)/ Clinical Pharmacy Specialist

## 2018-02-05 ENCOUNTER — Ambulatory Visit (INDEPENDENT_AMBULATORY_CARE_PROVIDER_SITE_OTHER): Payer: Medicare Other | Admitting: Pharmacist Clinician (PhC)/ Clinical Pharmacy Specialist

## 2018-02-05 DIAGNOSIS — Z5181 Encounter for therapeutic drug level monitoring: Secondary | ICD-10-CM | POA: Diagnosis not present

## 2018-02-05 DIAGNOSIS — I4891 Unspecified atrial fibrillation: Secondary | ICD-10-CM | POA: Diagnosis not present

## 2018-02-05 DIAGNOSIS — Z7901 Long term (current) use of anticoagulants: Secondary | ICD-10-CM

## 2018-02-19 ENCOUNTER — Other Ambulatory Visit: Payer: Self-pay | Admitting: Cardiology

## 2018-03-01 LAB — PROTIME-INR: INR: 2.6 — AB (ref 0.9–1.1)

## 2018-03-06 ENCOUNTER — Ambulatory Visit (INDEPENDENT_AMBULATORY_CARE_PROVIDER_SITE_OTHER): Payer: Medicare Other | Admitting: Pharmacist

## 2018-03-06 DIAGNOSIS — Z7901 Long term (current) use of anticoagulants: Secondary | ICD-10-CM | POA: Diagnosis not present

## 2018-03-06 DIAGNOSIS — I4891 Unspecified atrial fibrillation: Secondary | ICD-10-CM

## 2018-03-06 DIAGNOSIS — Z5181 Encounter for therapeutic drug level monitoring: Secondary | ICD-10-CM | POA: Diagnosis not present

## 2018-04-02 LAB — POCT INR
INR: 2 (ref 2.0–3.0)
INR: 2 (ref 2.0–3.0)

## 2018-04-10 ENCOUNTER — Encounter: Payer: Self-pay | Admitting: Pharmacist Clinician (PhC)/ Clinical Pharmacy Specialist

## 2018-04-10 ENCOUNTER — Ambulatory Visit (INDEPENDENT_AMBULATORY_CARE_PROVIDER_SITE_OTHER): Payer: Medicare Other

## 2018-04-10 DIAGNOSIS — Z5181 Encounter for therapeutic drug level monitoring: Secondary | ICD-10-CM | POA: Diagnosis not present

## 2018-04-10 DIAGNOSIS — I4891 Unspecified atrial fibrillation: Secondary | ICD-10-CM

## 2018-04-10 DIAGNOSIS — Z7901 Long term (current) use of anticoagulants: Secondary | ICD-10-CM | POA: Diagnosis not present

## 2018-05-01 LAB — POCT INR: INR: 2.2 (ref 2.0–3.0)

## 2018-05-03 ENCOUNTER — Ambulatory Visit (INDEPENDENT_AMBULATORY_CARE_PROVIDER_SITE_OTHER): Payer: Medicare Other | Admitting: Cardiovascular Disease

## 2018-05-03 DIAGNOSIS — Z5181 Encounter for therapeutic drug level monitoring: Secondary | ICD-10-CM

## 2018-05-03 DIAGNOSIS — I4891 Unspecified atrial fibrillation: Secondary | ICD-10-CM

## 2018-05-03 DIAGNOSIS — Z7901 Long term (current) use of anticoagulants: Secondary | ICD-10-CM | POA: Diagnosis not present

## 2018-05-29 LAB — PROTIME-INR: INR: 2.9 — AB (ref 0.9–1.1)

## 2018-05-31 LAB — PROTIME-INR: INR: 2.7 — AB (ref 0.9–1.1)

## 2018-06-01 ENCOUNTER — Ambulatory Visit (INDEPENDENT_AMBULATORY_CARE_PROVIDER_SITE_OTHER): Payer: Medicare Other | Admitting: Pharmacist Clinician (PhC)/ Clinical Pharmacy Specialist

## 2018-06-01 DIAGNOSIS — Z5181 Encounter for therapeutic drug level monitoring: Secondary | ICD-10-CM | POA: Diagnosis not present

## 2018-06-01 DIAGNOSIS — Z7901 Long term (current) use of anticoagulants: Secondary | ICD-10-CM | POA: Diagnosis not present

## 2018-06-01 DIAGNOSIS — I4891 Unspecified atrial fibrillation: Secondary | ICD-10-CM | POA: Diagnosis not present

## 2018-06-29 ENCOUNTER — Ambulatory Visit (INDEPENDENT_AMBULATORY_CARE_PROVIDER_SITE_OTHER): Payer: Medicare Other | Admitting: Pharmacist Clinician (PhC)/ Clinical Pharmacy Specialist

## 2018-06-29 ENCOUNTER — Encounter: Payer: Self-pay | Admitting: Pharmacist Clinician (PhC)/ Clinical Pharmacy Specialist

## 2018-06-29 DIAGNOSIS — Z5181 Encounter for therapeutic drug level monitoring: Secondary | ICD-10-CM

## 2018-06-29 DIAGNOSIS — I4891 Unspecified atrial fibrillation: Secondary | ICD-10-CM

## 2018-06-29 DIAGNOSIS — Z7901 Long term (current) use of anticoagulants: Secondary | ICD-10-CM | POA: Diagnosis not present

## 2018-06-29 LAB — PROTIME-INR: INR: 2.8 — AB (ref 0.9–1.1)

## 2018-07-04 ENCOUNTER — Ambulatory Visit (HOSPITAL_COMMUNITY)
Admission: EM | Admit: 2018-07-04 | Discharge: 2018-07-04 | Disposition: A | Discharge status: Home / Self Care | Payer: Medicare Other | Attending: Family Medicine | Admitting: Family Medicine

## 2018-07-04 ENCOUNTER — Ambulatory Visit (INDEPENDENT_AMBULATORY_CARE_PROVIDER_SITE_OTHER): Payer: Medicare Other

## 2018-07-04 ENCOUNTER — Other Ambulatory Visit: Payer: Self-pay

## 2018-07-04 ENCOUNTER — Encounter (HOSPITAL_COMMUNITY): Payer: Self-pay | Admitting: Emergency Medicine

## 2018-07-04 DIAGNOSIS — S93431A Sprain of tibiofibular ligament of right ankle, initial encounter: Secondary | ICD-10-CM | POA: Diagnosis not present

## 2018-07-04 DIAGNOSIS — M25571 Pain in right ankle and joints of right foot: Secondary | ICD-10-CM | POA: Diagnosis not present

## 2018-07-04 NOTE — ED Triage Notes (Signed)
Pt was trying to get into a vehicle when she fell.  She reports right lateral ankle pain that is a 10/10 every time she tries to move it.  She denies any other injuries.  She is here with family that witnessed the fall.

## 2018-07-04 NOTE — ED Provider Notes (Signed)
[place]    CSN: [id number] Arrival date & time: 07/04/18  1652     History   Chief Complaint Chief Complaint  Patient presents with  . Ankle Injury    right    HPI [name] is a 85 y.o. female.   Patient fell getting into a car she does not really remember mechanism of fall but she complains now of pain in her right ankle primarily on the lateral aspect.  She has no history of osteoporosis or fractures although she had a fractured arm she believes in the remote past  HPI  Past Medical History:  Diagnosis Date  . Anemia   . Aortic stenosis   . Benign bladder mass   . Chronic cough   . DJD (degenerative joint disease)   . Dyslipidemia   . ESRD on hemodialysis ([place])    "TTS; [place]" (09/27/2016)  . Gout   . HTN (hypertension)   . Obstructive sleep apnea    mild  . Orthostatic syncope    around year 2006  . Pneumonia   . TIA (transient ischemic attack)     Patient Active Problem List   Diagnosis Date Noted  . Syncope 04/25/2017  . Gastritis 04/25/2017  . Closed fracture of one rib of right side   . Hypotension 02/10/2017  . Lactic acidosis 02/10/2017  . Bradycardia 10/29/2016  . Acute respiratory failure ([place]) 09/27/2016  . Atypical chest pain 09/27/2016  . Essential hypertension 09/27/2016  . Acute pulmonary edema ([facility])   . Other chest pain   . Coumadin Rx 09/07/2016  . Monitoring for long-term anticoagulant use 08/29/2016  . Atrial flutter ([place])   . PAF (paroxysmal atrial fibrillation) ([place]) 08/22/2016  . Atrial fibrillation with RVR ([place]) 08/20/2016  . Severe aortic stenosis 08/20/2016  . New onset a-fib ([place]) 08/20/2016  . Staphylococcus aureus bacteremia   . 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 Bleeding from dialysis shunt ([place])   . Nausea   . Fever in adult 11/27/2015  . Hemorrhage of arteriovenous graft ([place]) 11/27/2015  . HLD (hyperlipidemia) 11/27/2015  .  Bleeding 11/27/2015  . Influenza A with respiratory manifestations 05/08/2013  . Cough 05/08/2013  . Chronic cough 02/15/2013  . Secondary hyperparathyroidism ([place]) 11/21/2012  . ESRD on dialysis [facility]) 11/20/2012    Past Surgical History:  Procedure Laterality Date  . BONE MARROW BIOPSY    . CATARACT EXTRACTION W/ INTRAOCULAR LENS  IMPLANT, BILATERAL    . COLONOSCOPY W/ BIOPSIES AND POLYPECTOMY    . left arm dialysis graft    . NEPHRECTOMY     left  . REVISION OF ARTERIOVENOUS GORETEX GRAFT Right 11/24/2014   Procedure: REPLACEMENT OF  MEDIAL SIDE OF RIGHT FORARM ARTERIOVENOUS GORETEX GRAFT;  Surgeon: Conrad Solon, MD;  Location: [place];  Service: Vascular;  Laterality: Right;  . REVISION OF ARTERIOVENOUS GORETEX GRAFT Right 11/28/2015   Procedure: REVISION OF RIGHT ARM ARTERIOVENOUS GORETEX GRAFT;  Surgeon: Serafina Mitchell, MD;  Location: [place];  Service: Vascular;  Laterality: Right;  . TOTAL ABDOMINAL HYSTERECTOMY      OB History   No obstetric history on file.      Home Medications    Prior to Admission medications   Medication Sig Start Date End Date Taking? Authorizing Provider  acetaminophen (TYLENOL) 500 MG tablet Take 500 mg by mouth every 6 (six) hours as needed.   Yes [provider]  amiodarone (PACERONE) 200 MG tablet  Take 0.5 tablets (100 mg total) by mouth at bedtime. 09/15/17  Yes Minus Breeding, MD  multivitamin (RENA-VIT) TABS tablet Take 1 tablet by mouth daily.   Yes [provider]  Nutritional Supplements (FEEDING SUPPLEMENT, NEPRO CARB STEADY,) LIQD Take 237 mLs by mouth 2 (two) times daily between meals. 02/10/17  Yes Mariel Aloe, MD  SENSIPAR 30 MG tablet Take 30 mg by mouth at bedtime.  02/14/13  Yes [provider]  warfarin (COUMADIN) 2.5 MG tablet TAKE 1 TO 1&1/2 TABLETS DAILY AS DIRECTED BY COUMADIN CLINIC. 02/19/18  Yes Minus Breeding, MD  chlorpheniramine (CHLOR-TRIMETON) 4 MG tablet One pill at night as needed for  cough 12/22/10 07/20/11  Chesley Mires, MD    Family History Family History  Problem Relation Age of Onset  . Hypertension Mother   . Diabetes Mother   . Hypertension Other     Social History Social History   Tobacco Use  . Smoking status: Never Smoker  . Smokeless tobacco: Never Used  Substance Use Topics  . Alcohol use: No    Alcohol/week: 0.0 standard drinks  . Drug use: No     Allergies   Tape and Sulfa antibiotics   Review of Systems Review of Systems  Musculoskeletal: Positive for gait problem and joint swelling.  All other systems reviewed and are negative.    Physical Exam Triage Vital Signs ED Triage Vitals  Enc Vitals Group     BP 07/04/18 1725 (!) 132/110     Pulse Rate 07/04/18 1725 (!) 130     Resp --      Temp 07/04/18 1725 98.1 F (36.7 C)     Temp Source 07/04/18 1725 Temporal     SpO2 07/04/18 1725 99 %     Weight --      Height --      Head Circumference --      Peak Flow --      Pain Score 07/04/18 1728 10     Pain Loc --      Pain Edu? --      Excl. in [place]? --    No data found.  Updated Vital Signs BP (!) 132/110 (BP Location: Left Arm)   Pulse (!) 130   Temp 98.1 F (36.7 C) (Temporal)   SpO2 99%   Visual Acuity Right Eye Distance:   Left Eye Distance:   Bilateral Distance:    Right Eye Near:   Left Eye Near:    Bilateral Near:     Physical Exam Constitutional:      Appearance: Normal appearance. She is obese.  Musculoskeletal:     Comments: Right ankle there is soft tissue swelling over the lateral malleolus and distal.  It is tender to touch laterally but posterior medial aspects not especially so.  She has strength testing all planes  Neurological:     Mental Status: She is alert.      UC Treatments / Results  Labs (all labs ordered are listed, but only abnormal results are displayed) Labs Reviewed - No data to display  EKG None  Radiology No results found.  Procedures Procedures (including critical  care time)  Medications Ordered in UC Medications - No data to display  Initial Impression / Assessment and Plan / UC Course  I have reviewed the triage vital signs and the nursing notes.  Pertinent labs & imaging results that were available during my care of the patient were reviewed by me and considered in  my medical decision making (see chart for details).     Sprain right ankle. Final Clinical Impressions(s) / UC Diagnoses   Final diagnoses:  None   Discharge Instructions   None    ED Prescriptions    None     Controlled Substance Prescriptions [place] Controlled Substance Registry consulted? No   Wardell Honour, MD 07/04/18 [phone]

## 2018-07-25 ENCOUNTER — Encounter: Payer: Self-pay | Admitting: Pharmacist Clinician (PhC)/ Clinical Pharmacy Specialist

## 2018-07-25 ENCOUNTER — Ambulatory Visit (INDEPENDENT_AMBULATORY_CARE_PROVIDER_SITE_OTHER): Payer: Medicare Other | Admitting: Internal Medicine

## 2018-07-25 DIAGNOSIS — Z7901 Long term (current) use of anticoagulants: Secondary | ICD-10-CM

## 2018-07-25 DIAGNOSIS — I4891 Unspecified atrial fibrillation: Secondary | ICD-10-CM

## 2018-07-25 DIAGNOSIS — Z5181 Encounter for therapeutic drug level monitoring: Secondary | ICD-10-CM

## 2018-07-25 LAB — POCT INR: INR: 3.7 — AB (ref 2.0–3.0)

## 2018-07-31 ENCOUNTER — Other Ambulatory Visit: Payer: Self-pay | Admitting: Cardiology

## 2018-08-02 LAB — PROTIME-INR: INR: 3 — AB (ref <=1.1)

## 2018-08-06 ENCOUNTER — Ambulatory Visit (INDEPENDENT_AMBULATORY_CARE_PROVIDER_SITE_OTHER): Payer: Medicare Other | Admitting: Pharmacist

## 2018-08-06 DIAGNOSIS — Z7901 Long term (current) use of anticoagulants: Secondary | ICD-10-CM | POA: Diagnosis not present

## 2018-08-06 DIAGNOSIS — I4891 Unspecified atrial fibrillation: Secondary | ICD-10-CM

## 2018-08-06 DIAGNOSIS — Z5181 Encounter for therapeutic drug level monitoring: Secondary | ICD-10-CM | POA: Diagnosis not present

## 2018-08-21 LAB — PROTIME-INR: INR: 3.2 — AB (ref <=1.1)

## 2018-08-23 ENCOUNTER — Ambulatory Visit (INDEPENDENT_AMBULATORY_CARE_PROVIDER_SITE_OTHER): Payer: Medicare Other | Admitting: Pharmacist

## 2018-08-23 DIAGNOSIS — Z5181 Encounter for therapeutic drug level monitoring: Secondary | ICD-10-CM | POA: Diagnosis not present

## 2018-08-23 DIAGNOSIS — I4891 Unspecified atrial fibrillation: Secondary | ICD-10-CM

## 2018-08-23 DIAGNOSIS — Z7901 Long term (current) use of anticoagulants: Secondary | ICD-10-CM

## 2018-08-28 LAB — PROTIME-INR: INR: 2.9 — AB (ref <=1.1)

## 2018-08-30 ENCOUNTER — Telehealth: Payer: Self-pay | Admitting: Pharmacist Clinician (PhC)/ Clinical Pharmacy Specialist

## 2018-08-30 ENCOUNTER — Ambulatory Visit (INDEPENDENT_AMBULATORY_CARE_PROVIDER_SITE_OTHER): Payer: Medicare Other | Admitting: Pharmacist Clinician (PhC)/ Clinical Pharmacy Specialist

## 2018-08-30 DIAGNOSIS — Z5181 Encounter for therapeutic drug level monitoring: Secondary | ICD-10-CM

## 2018-08-30 DIAGNOSIS — Z7901 Long term (current) use of anticoagulants: Secondary | ICD-10-CM

## 2018-08-30 DIAGNOSIS — I4891 Unspecified atrial fibrillation: Secondary | ICD-10-CM | POA: Diagnosis not present

## 2018-08-30 NOTE — Telephone Encounter (Signed)
Results sent thru MyChart

## 2018-09-12 ENCOUNTER — Encounter: Payer: Self-pay | Admitting: Pharmacist Clinician (PhC)/ Clinical Pharmacy Specialist

## 2018-09-12 ENCOUNTER — Ambulatory Visit (INDEPENDENT_AMBULATORY_CARE_PROVIDER_SITE_OTHER): Payer: Medicare Other | Admitting: Pharmacist Clinician (PhC)/ Clinical Pharmacy Specialist

## 2018-09-12 DIAGNOSIS — I4891 Unspecified atrial fibrillation: Secondary | ICD-10-CM | POA: Diagnosis not present

## 2018-09-12 DIAGNOSIS — Z5181 Encounter for therapeutic drug level monitoring: Secondary | ICD-10-CM

## 2018-09-12 DIAGNOSIS — Z7901 Long term (current) use of anticoagulants: Secondary | ICD-10-CM

## 2018-09-12 LAB — PROTIME-INR: INR: 3.5 — AB (ref 0.9–1.1)

## 2018-09-19 ENCOUNTER — Telehealth: Payer: Self-pay | Admitting: Cardiology

## 2018-09-20 NOTE — Progress Notes (Signed)
Virtual Visit via Telephone Note   This visit type was conducted due to national recommendations for restrictions regarding the COVID-19 Pandemic (e.g. social distancing) in an effort to limit this patient's exposure and mitigate transmission in our community.  Due to her co-morbid illnesses, this patient is at least at moderate risk for complications without adequate follow up.  This format is felt to be most appropriate for this patient at this time.  The patient did not have access to video technology/had technical difficulties with video requiring transitioning to audio format only (telephone).  All issues noted in this document were discussed and addressed.  No physical exam could be performed with this format.  Please refer to the patient's chart for her  consent to telehealth for [facility].   Date:  09/21/2018   ID:  [name], DOB 03/03/1922, MRN [id number]  Patient Location: Home Provider Location: Home  PCP:  Levin Erp, MD  Cardiologist:  Minus Breeding, MD  Electrophysiologist:  None   Evaluation Performed:  Follow-Up Visit  Chief Complaint:  Atrial fib   History of Present Illness:    [name] is a 85 y.o. female who presents for follow up of hypotension, AS and atrial fib.  Since I last saw her she has done well.  She goes to dialysis and does well with this.  The patient denies any new symptoms such as chest discomfort, neck or arm discomfort. There has been no new shortness of breath, PND or orthopnea. There have been no reported palpitations, presyncope or syncope.    The patient does not have symptoms concerning for COVID-19 infection (fever, chills, cough, or new shortness of breath).    Past Medical History:  Diagnosis Date  . Anemia   . Aortic stenosis   . Benign bladder mass   . Chronic cough   . DJD (degenerative joint disease)   . Dyslipidemia   . ESRD on hemodialysis ([place])    "TTS; [place]" (09/27/2016)  . Gout   . HTN  (hypertension)   . Obstructive sleep apnea    mild  . Orthostatic syncope    around year 2006  . Pneumonia   . TIA (transient ischemic attack)    Past Surgical History:  Procedure Laterality Date  . BONE MARROW BIOPSY    . CATARACT EXTRACTION W/ INTRAOCULAR LENS  IMPLANT, BILATERAL    . COLONOSCOPY W/ BIOPSIES AND POLYPECTOMY    . left arm dialysis graft    . NEPHRECTOMY     left  . REVISION OF ARTERIOVENOUS GORETEX GRAFT Right 11/24/2014   Procedure: REPLACEMENT OF  MEDIAL SIDE OF RIGHT FORARM ARTERIOVENOUS GORETEX GRAFT;  Surgeon: Conrad [place], MD;  Location: [place];  Service: Vascular;  Laterality: Right;  . REVISION OF ARTERIOVENOUS GORETEX GRAFT Right 11/28/2015   Procedure: REVISION OF RIGHT ARM ARTERIOVENOUS GORETEX GRAFT;  Surgeon: Serafina Mitchell, MD;  Location: [place];  Service: Vascular;  Laterality: Right;  . TOTAL ABDOMINAL HYSTERECTOMY       Prior to Admission medications   Medication Sig Start Date End Date Taking? Authorizing Provider  acetaminophen (TYLENOL) 500 MG tablet Take 500 mg by mouth every 6 (six) hours as needed.   Yes [provider]  amiodarone (PACERONE) 200 MG tablet Take 0.5 tablets (100 mg total) by mouth at bedtime. 09/15/17  Yes Minus Breeding, MD  multivitamin (RENA-VIT) TABS tablet Take 1 tablet by mouth daily.   Yes [provider]  Nutritional Supplements (FEEDING  SUPPLEMENT, NEPRO CARB STEADY,) LIQD Take 237 mLs by mouth 2 (two) times daily between meals. 02/10/17  Yes Mariel Aloe, MD  SENSIPAR 30 MG tablet Take 30 mg by mouth at bedtime.  02/14/13  Yes [provider]  warfarin (COUMADIN) 2.5 MG tablet TAKE 1 TO 1&1/2 TABLETS DAILY AS DIRECTED BY COUMADIN CLINIC. 07/31/18  Yes Minus Breeding, MD  chlorpheniramine (CHLOR-TRIMETON) 4 MG tablet One pill at night as needed for cough 12/22/10 07/20/11  Chesley Mires, MD     Allergies:   Tape and Sulfa antibiotics   Social History   Tobacco Use  . Smoking status: Never  Smoker  . Smokeless tobacco: Never Used  Substance Use Topics  . Alcohol use: No    Alcohol/week: 0.0 standard drinks  . Drug use: No     Family Hx: The patient's family history includes Diabetes in her mother; Hypertension in her mother and another family member.  ROS:   Please see the history of present illness.    As stated in the HPI and negative for all other systems.   Prior CV studies:   The following studies were reviewed today:    Labs/Other Tests and Data Reviewed:    EKG:  No ECG reviewed.  Recent Labs: No results found for requested labs within last 8760 hours.   Recent Lipid Panel Lab Results  Component Value Date/Time   CHOL 143 08/21/2016 04:29 AM   TRIG 62 08/21/2016 04:29 AM   HDL 68 08/21/2016 04:29 AM   CHOLHDL 2.1 08/21/2016 04:29 AM   LDLCALC 63 08/21/2016 04:29 AM    Wt Readings from Last 3 Encounters:  09/15/17 131 lb (59.4 kg)  08/22/17 123 lb (55.8 kg)  04/26/17 126 lb (57.2 kg)     Objective:    Vital Signs:  BP (!) 109/42   Pulse (!) 108    VITAL SIGNS:  reviewed  ASSESSMENT & PLAN:    AS:   This is severe but controlled with dialysis.  No change in therapy.dically.      ATRIAL FIB:   [name] has a CHA2DS2 - VASc score of 6.  She tolerates anticoagulation.  She has not had any symptomatic paroxysms.  I am going to check with dialysis and if I cannot find a recent TSH and liver profile we will order this.  PULMONARY HTN:  This is managed symptomatically.  She is doing quite well.  RBBB:   She is had some bradycardia and previously metoprolol was stopped but she is done well and has no symptoms.  No change in therapy.  HTN:    Blood pressures well controlled.  No change in therapy.  COVID-19 Education: The signs and symptoms of COVID-19 were discussed with the patient and how to seek care for testing (follow up with PCP or arrange E-visit).   The importance of social distancing was discussed today.  Time:    Today, I have spent 16 minutes with the patient with telehealth technology discussing the above problems.     Medication Adjustments/Labs and Tests Ordered: Current medicines are reviewed at length with the patient today.  Concerns regarding medicines are outlined above.   Tests Ordered: No orders of the defined types were placed in this encounter.   Medication Changes: No orders of the defined types were placed in this encounter.   Disposition:  Follow up with me in the office in six months   Signed, Minus Breeding, MD  09/21/2018 12:35 PM  [facility]

## 2018-09-21 ENCOUNTER — Encounter: Payer: Self-pay | Admitting: Cardiology

## 2018-09-21 ENCOUNTER — Telehealth (INDEPENDENT_AMBULATORY_CARE_PROVIDER_SITE_OTHER): Payer: Medicare Other | Admitting: Cardiology

## 2018-09-21 VITALS — BP 109/42 | HR 108

## 2018-09-21 DIAGNOSIS — I4891 Unspecified atrial fibrillation: Secondary | ICD-10-CM | POA: Diagnosis not present

## 2018-09-21 DIAGNOSIS — I10 Essential (primary) hypertension: Secondary | ICD-10-CM

## 2018-09-21 DIAGNOSIS — I350 Nonrheumatic aortic (valve) stenosis: Secondary | ICD-10-CM

## 2018-09-21 DIAGNOSIS — Z7189 Other specified counseling: Secondary | ICD-10-CM

## 2018-09-21 NOTE — Patient Instructions (Signed)

## 2018-10-04 LAB — POCT INR: INR: 3.5 — AB (ref 2.0–3.0)

## 2018-10-08 ENCOUNTER — Ambulatory Visit (INDEPENDENT_AMBULATORY_CARE_PROVIDER_SITE_OTHER): Payer: Medicare Other | Admitting: Pharmacist Clinician (PhC)/ Clinical Pharmacy Specialist

## 2018-10-08 ENCOUNTER — Telehealth: Payer: Self-pay | Admitting: Cardiology

## 2018-10-08 DIAGNOSIS — I4891 Unspecified atrial fibrillation: Secondary | ICD-10-CM | POA: Diagnosis not present

## 2018-10-08 DIAGNOSIS — Z7901 Long term (current) use of anticoagulants: Secondary | ICD-10-CM

## 2018-10-08 DIAGNOSIS — Z5181 Encounter for therapeutic drug level monitoring: Secondary | ICD-10-CM

## 2018-10-08 NOTE — Telephone Encounter (Signed)
See anticoag note 10/08/18

## 2018-10-08 NOTE — Telephone Encounter (Signed)
Follow up:    Patient returning your call back. Please call patient.

## 2018-11-01 ENCOUNTER — Other Ambulatory Visit: Payer: Self-pay | Admitting: Cardiology

## 2018-11-01 LAB — PROTIME-INR: INR: 3 — AB (ref 0.9–1.1)

## 2018-11-02 ENCOUNTER — Ambulatory Visit (INDEPENDENT_AMBULATORY_CARE_PROVIDER_SITE_OTHER): Payer: Medicare Other | Admitting: Pharmacist

## 2018-11-02 DIAGNOSIS — Z5181 Encounter for therapeutic drug level monitoring: Secondary | ICD-10-CM

## 2018-11-02 DIAGNOSIS — Z7901 Long term (current) use of anticoagulants: Secondary | ICD-10-CM

## 2018-11-02 DIAGNOSIS — I4891 Unspecified atrial fibrillation: Secondary | ICD-10-CM | POA: Diagnosis not present

## 2018-11-06 NOTE — Telephone Encounter (Signed)
Opened in error

## 2018-11-12 ENCOUNTER — Other Ambulatory Visit: Payer: Self-pay | Admitting: Cardiology

## 2018-11-14 LAB — POCT INR: INR: 3.7 — AB (ref 2.0–3.0)

## 2018-11-15 ENCOUNTER — Ambulatory Visit (INDEPENDENT_AMBULATORY_CARE_PROVIDER_SITE_OTHER): Payer: Medicare Other | Admitting: Cardiology

## 2018-11-15 DIAGNOSIS — Z7901 Long term (current) use of anticoagulants: Secondary | ICD-10-CM | POA: Diagnosis not present

## 2018-11-15 DIAGNOSIS — I4891 Unspecified atrial fibrillation: Secondary | ICD-10-CM

## 2018-11-15 DIAGNOSIS — Z5181 Encounter for therapeutic drug level monitoring: Secondary | ICD-10-CM

## 2018-11-15 NOTE — Patient Instructions (Signed)
Description   Spoke with patient's daughter and advised her to skip warfarin today, then start taking 1.5 tablets daily except 1 tablet each Sunday and Wednesdays.  Repeat INR in 2 weeks at dialysis Order faxed to dialysis at [phone].

## 2018-11-27 LAB — PROTIME-INR: INR: 4.5 — AB (ref <=1.1)

## 2018-11-28 ENCOUNTER — Encounter: Payer: Self-pay | Admitting: Pharmacist Clinician (PhC)/ Clinical Pharmacy Specialist

## 2018-11-28 ENCOUNTER — Ambulatory Visit (INDEPENDENT_AMBULATORY_CARE_PROVIDER_SITE_OTHER): Payer: Medicare Other | Admitting: Pharmacist Clinician (PhC)/ Clinical Pharmacy Specialist

## 2018-11-28 DIAGNOSIS — I4891 Unspecified atrial fibrillation: Secondary | ICD-10-CM

## 2018-11-28 DIAGNOSIS — Z5181 Encounter for therapeutic drug level monitoring: Secondary | ICD-10-CM | POA: Diagnosis not present

## 2018-11-28 DIAGNOSIS — Z7901 Long term (current) use of anticoagulants: Secondary | ICD-10-CM

## 2018-12-04 LAB — PROTIME-INR: INR: 4.4 — AB (ref 0.9–1.1)

## 2018-12-05 ENCOUNTER — Ambulatory Visit (INDEPENDENT_AMBULATORY_CARE_PROVIDER_SITE_OTHER): Payer: Medicare Other | Admitting: Pharmacist

## 2018-12-05 DIAGNOSIS — Z5181 Encounter for therapeutic drug level monitoring: Secondary | ICD-10-CM

## 2018-12-05 DIAGNOSIS — Z7901 Long term (current) use of anticoagulants: Secondary | ICD-10-CM

## 2018-12-05 DIAGNOSIS — I4891 Unspecified atrial fibrillation: Secondary | ICD-10-CM | POA: Diagnosis not present

## 2018-12-12 ENCOUNTER — Ambulatory Visit (INDEPENDENT_AMBULATORY_CARE_PROVIDER_SITE_OTHER): Payer: Medicare Other | Admitting: Cardiology

## 2018-12-12 ENCOUNTER — Encounter: Payer: Self-pay | Admitting: Pharmacist Clinician (PhC)/ Clinical Pharmacy Specialist

## 2018-12-12 DIAGNOSIS — I4891 Unspecified atrial fibrillation: Secondary | ICD-10-CM

## 2018-12-12 DIAGNOSIS — Z7901 Long term (current) use of anticoagulants: Secondary | ICD-10-CM | POA: Diagnosis not present

## 2018-12-12 DIAGNOSIS — Z5181 Encounter for therapeutic drug level monitoring: Secondary | ICD-10-CM

## 2018-12-12 LAB — POCT INR: INR: 1.9 — AB (ref 2.0–3.0)

## 2018-12-26 ENCOUNTER — Ambulatory Visit (INDEPENDENT_AMBULATORY_CARE_PROVIDER_SITE_OTHER): Payer: Medicare Other

## 2018-12-26 DIAGNOSIS — Z7901 Long term (current) use of anticoagulants: Secondary | ICD-10-CM

## 2018-12-26 DIAGNOSIS — I4891 Unspecified atrial fibrillation: Secondary | ICD-10-CM

## 2018-12-26 DIAGNOSIS — Z5181 Encounter for therapeutic drug level monitoring: Secondary | ICD-10-CM

## 2018-12-26 LAB — POCT INR: INR: 3.3 — AB (ref 2.0–3.0)

## 2019-01-10 ENCOUNTER — Encounter: Payer: Self-pay | Admitting: Cardiology

## 2019-01-10 LAB — POCT INR

## 2019-01-22 LAB — POCT INR: INR: 2.2 (ref 2.0–3.0)

## 2019-02-05 ENCOUNTER — Ambulatory Visit (INDEPENDENT_AMBULATORY_CARE_PROVIDER_SITE_OTHER): Payer: Medicare Other | Admitting: Cardiology

## 2019-02-05 DIAGNOSIS — Z5181 Encounter for therapeutic drug level monitoring: Secondary | ICD-10-CM

## 2019-02-05 DIAGNOSIS — I4891 Unspecified atrial fibrillation: Secondary | ICD-10-CM

## 2019-02-05 DIAGNOSIS — Z7901 Long term (current) use of anticoagulants: Secondary | ICD-10-CM

## 2019-02-14 LAB — POCT INR: INR: 2.5 (ref 2.0–3.0)

## 2019-02-18 ENCOUNTER — Ambulatory Visit (INDEPENDENT_AMBULATORY_CARE_PROVIDER_SITE_OTHER): Payer: Medicare Other | Admitting: Cardiology

## 2019-02-18 ENCOUNTER — Encounter: Payer: Self-pay | Admitting: Pharmacist Clinician (PhC)/ Clinical Pharmacy Specialist

## 2019-02-18 DIAGNOSIS — Z5181 Encounter for therapeutic drug level monitoring: Secondary | ICD-10-CM

## 2019-02-18 DIAGNOSIS — Z7901 Long term (current) use of anticoagulants: Secondary | ICD-10-CM | POA: Diagnosis not present

## 2019-02-18 DIAGNOSIS — I4891 Unspecified atrial fibrillation: Secondary | ICD-10-CM

## 2019-02-25 ENCOUNTER — Other Ambulatory Visit: Payer: Self-pay

## 2019-02-26 ENCOUNTER — Emergency Department (HOSPITAL_COMMUNITY): Payer: Medicare Other

## 2019-02-26 ENCOUNTER — Emergency Department (HOSPITAL_COMMUNITY)
Admission: EM | Admit: 2019-02-26 | Discharge: 2019-02-26 | Disposition: A | Discharge status: Home / Self Care | Payer: Medicare Other | Attending: Emergency Medicine | Admitting: Emergency Medicine

## 2019-02-26 ENCOUNTER — Other Ambulatory Visit: Payer: Self-pay

## 2019-02-26 ENCOUNTER — Encounter (HOSPITAL_COMMUNITY): Payer: Self-pay | Admitting: Emergency Medicine

## 2019-02-26 DIAGNOSIS — I120 Hypertensive chronic kidney disease with stage 5 chronic kidney disease or end stage renal disease: Secondary | ICD-10-CM | POA: Insufficient documentation

## 2019-02-26 DIAGNOSIS — I480 Paroxysmal atrial fibrillation: Secondary | ICD-10-CM | POA: Insufficient documentation

## 2019-02-26 DIAGNOSIS — Z7901 Long term (current) use of anticoagulants: Secondary | ICD-10-CM | POA: Insufficient documentation

## 2019-02-26 DIAGNOSIS — N186 End stage renal disease: Secondary | ICD-10-CM | POA: Insufficient documentation

## 2019-02-26 DIAGNOSIS — M6281 Muscle weakness (generalized): Secondary | ICD-10-CM | POA: Diagnosis not present

## 2019-02-26 DIAGNOSIS — M791 Myalgia, unspecified site: Secondary | ICD-10-CM

## 2019-02-26 DIAGNOSIS — Z8673 Personal history of transient ischemic attack (TIA), and cerebral infarction without residual deficits: Secondary | ICD-10-CM | POA: Diagnosis not present

## 2019-02-26 DIAGNOSIS — R531 Weakness: Secondary | ICD-10-CM | POA: Diagnosis present

## 2019-02-26 DIAGNOSIS — Z992 Dependence on renal dialysis: Secondary | ICD-10-CM | POA: Diagnosis not present

## 2019-02-26 DIAGNOSIS — Z20828 Contact with and (suspected) exposure to other viral communicable diseases: Secondary | ICD-10-CM | POA: Diagnosis not present

## 2019-02-26 LAB — TROPONIN I (HIGH SENSITIVITY)
Troponin I (High Sensitivity): 38 ng/L — ABNORMAL HIGH (ref <18)
Troponin I (High Sensitivity): 35 ng/L — ABNORMAL HIGH (ref <18)

## 2019-02-26 LAB — CBC WITH DIFFERENTIAL/PLATELET
Abs Immature Granulocytes: 0.01 10*3/uL (ref 0.00–0.07)
Basophils Absolute: 0 10*3/uL (ref 0.0–0.1)
Basophils Relative: 1 %
Eosinophils Absolute: 0.1 10*3/uL (ref 0.0–0.5)
Eosinophils Relative: 2 %
HCT: 34.3 % — ABNORMAL LOW (ref 36.0–46.0)
Hemoglobin: 11.1 g/dL — ABNORMAL LOW (ref 12.0–15.0)
Immature Granulocytes: 0 %
Lymphocytes Relative: 35 %
Lymphs Abs: 1.4 10*3/uL (ref 0.7–4.0)
MCH: 32.6 pg (ref 26.0–34.0)
MCHC: 32.4 g/dL (ref 30.0–36.0)
MCV: 100.9 fL — ABNORMAL HIGH (ref 80.0–100.0)
Monocytes Absolute: 0.3 10*3/uL (ref 0.1–1.0)
Monocytes Relative: 8 %
Neutro Abs: 2.2 10*3/uL (ref 1.7–7.7)
Neutrophils Relative %: 54 %
Platelets: 117 10*3/uL — ABNORMAL LOW (ref 150–400)
RBC: 3.4 MIL/uL — ABNORMAL LOW (ref 3.87–5.11)
RDW: 13.2 % (ref 11.5–15.5)
WBC: 4 10*3/uL (ref 4.0–10.5)
nRBC: 0 % (ref 0.0–0.2)

## 2019-02-26 LAB — BASIC METABOLIC PANEL
Anion gap: 16 — ABNORMAL HIGH (ref 5–15)
BUN: 56 mg/dL — ABNORMAL HIGH (ref 8–23)
CO2: 29 mmol/L (ref 22–32)
Calcium: 8.8 mg/dL — ABNORMAL LOW (ref 8.9–10.3)
Chloride: 92 mmol/L — ABNORMAL LOW (ref 98–111)
Creatinine, Ser: 6.9 mg/dL — ABNORMAL HIGH (ref 0.44–1.00)
GFR calc Af Amer: 5 mL/min — ABNORMAL LOW (ref >60)
GFR calc non Af Amer: 5 mL/min — ABNORMAL LOW (ref >60)
Glucose, Bld: 95 mg/dL (ref 70–99)
Potassium: 4.5 mmol/L (ref 3.5–5.1)
Sodium: 137 mmol/L (ref 135–145)

## 2019-02-26 LAB — SEDIMENTATION RATE: Sed Rate: 11 mm/hr (ref 0–22)

## 2019-02-26 LAB — PROTIME-INR
INR: 1.9 — ABNORMAL HIGH (ref 0.8–1.2)
Prothrombin Time: 21.6 seconds — ABNORMAL HIGH (ref 11.4–15.2)

## 2019-02-26 LAB — SARS CORONAVIRUS 2 (TAT 6-24 HRS): SARS Coronavirus 2: NEGATIVE

## 2019-02-26 LAB — CK: Total CK: 57 U/L (ref 38–234)

## 2019-02-26 MED ORDER — PREDNISONE 20 MG PO TABS: 20.0000 mg | ORAL_TABLET | Freq: Every day | ORAL | 0 refills | Status: DC

## 2019-02-26 MED ORDER — PREDNISONE 20 MG PO TABS
20.0000 mg | ORAL_TABLET | Freq: Once | ORAL | Status: AC
  Administered 2019-02-26: 13:00:00 20 mg via ORAL
  Filled 2019-02-26: qty 1

## 2019-02-26 NOTE — ED Provider Notes (Signed)
[facility] EMERGENCY DEPARTMENT Provider Note   CSN: [id number] Arrival date & time: 02/26/19  2202     History   Chief Complaint Chief Complaint  Patient presents with  . Weakness    HPI [name] is a 85 y.o. female.  She is brought in by ambulance complaining of pain all over.  She says this is been going on for a few weeks that she is having difficulty walking or even feeding herself.  She feels generally weak.  She denies any falls.  No fevers chills vomiting diarrhea.  Chronic cough.  Nonproductive.     The history is provided by the patient.  Weakness Severity:  Unable to specify Onset quality:  Gradual Duration:  2 weeks Timing:  Constant Progression:  Worsening Chronicity:  New Relieved by:  None tried Worsened by:  Activity Ineffective treatments:  None tried Associated symptoms: arthralgias, cough, difficulty walking and myalgias   Associated symptoms: no abdominal pain, no chest pain, no diarrhea, no dysuria, no numbness in extremities, no falls, no fever, no foul-smelling urine, no headaches, no loss of consciousness, no nausea, no shortness of breath, no syncope and no vomiting     Past Medical History:  Diagnosis Date  . Anemia   . Aortic stenosis   . Benign bladder mass   . Chronic cough   . DJD (degenerative joint disease)   . Dyslipidemia   . ESRD on hemodialysis ([place])    "TTS; [place]" (09/27/2016)  . Gout   . HTN (hypertension)   . Obstructive sleep apnea    mild  . Orthostatic syncope    around year 2006  . Pneumonia   . TIA (transient ischemic attack)     Patient Active Problem List   Diagnosis Date Noted  . Educated about COVID-19 virus infection 09/21/2018  . Syncope 04/25/2017  . Gastritis 04/25/2017  . Closed fracture of one rib of right side   . Hypotension 02/10/2017  . Lactic acidosis 02/10/2017  . Bradycardia 10/29/2016  . Acute respiratory failure ([place]) 09/27/2016  . Atypical chest pain  09/27/2016  . Essential hypertension 09/27/2016  . Acute pulmonary edema ([facility])   . Other chest pain   . Coumadin Rx 09/07/2016  . Monitoring for long-term anticoagulant use 08/29/2016  . Atrial flutter ([place])   . PAF (paroxysmal atrial fibrillation) ([place]) 08/22/2016  . Atrial fibrillation with RVR ([place]) 08/20/2016  . Severe aortic stenosis 08/20/2016  . New onset a-fib ([place]) 08/20/2016  . Staphylococcus aureus bacteremia   . 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 Bleeding from dialysis shunt ([place])   . Nausea   . Fever in adult 11/27/2015  . Hemorrhage of arteriovenous graft ([place]) 11/27/2015  . HLD (hyperlipidemia) 11/27/2015  . Bleeding 11/27/2015  . Influenza A with respiratory manifestations 05/08/2013  . Cough 05/08/2013  . Chronic cough 02/15/2013  . Secondary hyperparathyroidism ([place]) 11/21/2012  . ESRD on dialysis [facility]) 11/20/2012    Past Surgical History:  Procedure Laterality Date  . BONE MARROW BIOPSY    . CATARACT EXTRACTION W/ INTRAOCULAR LENS  IMPLANT, BILATERAL    . COLONOSCOPY W/ BIOPSIES AND POLYPECTOMY    . left arm dialysis graft    . NEPHRECTOMY     left  . REVISION OF ARTERIOVENOUS GORETEX GRAFT Right 11/24/2014   Procedure: REPLACEMENT OF  MEDIAL SIDE OF RIGHT FORARM ARTERIOVENOUS GORETEX GRAFT;  Surgeon: Conrad Friant, MD;  Location: [facility];  Service: Vascular;  Laterality: Right;  . REVISION OF ARTERIOVENOUS GORETEX GRAFT Right 11/28/2015   Procedure: REVISION OF RIGHT ARM ARTERIOVENOUS GORETEX GRAFT;  Surgeon: Serafina Mitchell, MD;  Location: [facility];  Service: Vascular;  Laterality: Right;  . TOTAL ABDOMINAL HYSTERECTOMY       OB History   No obstetric history on file.      Home Medications    Prior to Admission medications   Medication Sig Start Date End Date Taking? Authorizing Provider  acetaminophen (TYLENOL) 500 MG tablet Take 500 mg by mouth every 6 (six) hours as needed.    [provider]  amiodarone (PACERONE) 200 MG tablet TAKE 1/2 TABLET AT BEDTIME. 11/01/18   Minus Breeding, MD  multivitamin (RENA-VIT) TABS tablet Take 1 tablet by mouth daily.    [provider]  Nutritional Supplements (FEEDING SUPPLEMENT, NEPRO CARB STEADY,) LIQD Take 237 mLs by mouth 2 (two) times daily between meals. 02/10/17   Mariel Aloe, MD  SENSIPAR 30 MG tablet Take 30 mg by mouth at bedtime.  02/14/13   [provider]  warfarin (COUMADIN) 2.5 MG tablet TAKE 1 TO 1&1/2 TABLETS DAILY AS DIRECTED BY COUMADIN CLINIC. 11/12/18   Minus Breeding, MD  chlorpheniramine (CHLOR-TRIMETON) 4 MG tablet One pill at night as needed for cough 12/22/10 07/20/11  Chesley Mires, MD    Family History Family History  Problem Relation Age of Onset  . Hypertension Mother   . Diabetes Mother   . Hypertension Other     Social History Social History   Tobacco Use  . Smoking status: Never Smoker  . Smokeless tobacco: Never Used  Substance Use Topics  . Alcohol use: No    Alcohol/week: 0.0 standard drinks  . Drug use: No     Allergies   Tape and Sulfa antibiotics   Review of Systems Review of Systems  Constitutional: Negative for fever.  HENT: Negative for sore throat.   Eyes: Negative for visual disturbance.  Respiratory: Positive for cough. Negative for shortness of breath.   Cardiovascular: Negative for chest pain and syncope.  Gastrointestinal: Negative for abdominal pain, diarrhea, nausea and vomiting.  Genitourinary: Negative for dysuria.  Musculoskeletal: Positive for arthralgias and myalgias. Negative for falls.  Skin: Negative for rash.  Neurological: Positive for weakness. Negative for loss of consciousness and headaches.     Physical Exam Updated Vital Signs BP (!) 143/41 (BP Location: Left Arm)   Pulse 64   Temp 97.7 F (36.5 C) (Oral)   Resp 15   SpO2 97%   Physical Exam Vitals signs and nursing note reviewed.  Constitutional:      General: She  is not in acute distress.    Appearance: She is well-developed and underweight.  HENT:     Head: Normocephalic and atraumatic.  Eyes:     Conjunctiva/sclera: Conjunctivae normal.  Neck:     Musculoskeletal: Neck supple.  Cardiovascular:     Rate and Rhythm: Normal rate and regular rhythm.     Heart sounds: Murmur (systolic) present.  Pulmonary:     Effort: Pulmonary effort is normal. No respiratory distress.     Breath sounds: Normal breath sounds.  Abdominal:     Palpations: Abdomen is soft.     Tenderness: There is no abdominal tenderness.  Musculoskeletal: Normal range of motion.        General: No deformity or signs of injury.  Skin:    General: Skin is warm and dry.     Capillary Refill:  Capillary refill takes less than 2 seconds.  Neurological:     General: No focal deficit present.     Mental Status: She is alert. Mental status is at baseline.      ED Treatments / Results  Labs (all labs ordered are listed, but only abnormal results are displayed) Labs Reviewed  BASIC METABOLIC PANEL - Abnormal; Notable for the following components:      Result Value   Chloride 92 (*)    BUN 56 (*)    Creatinine, Ser 6.90 (*)    Calcium 8.8 (*)    GFR calc non Af Amer 5 (*)    GFR calc Af Amer 5 (*)    Anion gap 16 (*)    All other components within normal limits  CBC WITH DIFFERENTIAL/PLATELET - Abnormal; Notable for the following components:   RBC 3.40 (*)    Hemoglobin 11.1 (*)    HCT 34.3 (*)    MCV 100.9 (*)    Platelets 117 (*)    All other components within normal limits  PROTIME-INR - Abnormal; Notable for the following components:   Prothrombin Time 21.6 (*)    INR 1.9 (*)    All other components within normal limits  TROPONIN I (HIGH SENSITIVITY) - Abnormal; Notable for the following components:   Troponin I (High Sensitivity) 38 (*)    All other components within normal limits  TROPONIN I (HIGH SENSITIVITY) - Abnormal; Notable for the following components:    Troponin I (High Sensitivity) 35 (*)    All other components within normal limits  SARS CORONAVIRUS 2 (TAT 6-24 HRS)  SEDIMENTATION RATE  CK    EKG EKG Interpretation  Date/Time:  Tuesday February 26 2019 08:14:45 EDT Ventricular Rate:  60 PR Interval:    QRS Duration: 167 QT Interval:  469 QTC Calculation: 469 R Axis:   -11 Text Interpretation:  Sinus rhythm Borderline prolonged PR interval Right bundle branch block similar to prior 12/18 Confirmed by Aletta Edouard [phone]) on 02/26/2019 8:44:10 AM Also confirmed by Aletta Edouard [phone]), editor Hattie Perch [phone])  on 02/26/2019 2:13:10 PM   Radiology Dg Chest Port 1 View  Result Date: 02/26/2019 CLINICAL DATA:  Weakness. EXAM: PORTABLE CHEST 1 VIEW COMPARISON:  October 09, 2017. FINDINGS: Stable cardiomediastinal silhouette. Atherosclerosis of thoracic aorta is noted. No pneumothorax or pleural effusion is noted. Right lung is clear. Mild left basilar atelectasis or scarring is noted. Bony thorax is unremarkable. IMPRESSION: Mild left basilar subsegmental atelectasis or scarring. Aortic Atherosclerosis (ICD10-I70.0). Electronically Signed   By: Marijo Conception M.D.   On: 02/26/2019 08:59    Procedures Procedures (including critical care time)  Medications Ordered in ED Medications  predniSONE (DELTASONE) tablet 20 mg (20 mg Oral Given 02/26/19 1314)     Initial Impression / Assessment and Plan / ED Course  I have reviewed the triage vital signs and the nursing notes.  Pertinent labs & imaging results that were available during my care of the patient were reviewed by me and considered in my medical decision making (see chart for details).  Clinical Course as of Feb 25 1702  Tue Oct 20, 820  [id number] 85 year old female here with pain throughout her arms legs causing her difficulty with walking or using her hands.  Denies any falls.  Chronic cough.  Differential includes arthritis, PMR, pneumonia, Covid, UTI, ACS.    [MB]  [id number] Chest x-ray reviewed by me no gross infiltrates.   [MB]  1007 Labs coming  back showing her to be anemic although at her baseline.  Renal function consistent with her end-stage renal disease.  Troponin elevated at 38 will need to be repeated.  She said she does not make any urine so canceled her urinalysis.   [MB]  71 Long discussion with the patient and her daughter.  Ultimately have not found any reason for her pains in her hands and feet and stiffness.  Her sed rate was low so I less likely to be PMR although I think it be worth trying her on some prednisone.  She had an appointment with her PCP on Monday.  She should also make sure her dialysis team looks at her graft to make sure there is any issues there.   [MB]    Clinical Course User Index [MB] Hayden Rasmussen, MD   [place] was evaluated in Emergency Department on 02/26/2019 for the symptoms described in the history of present illness. She was evaluated in the context of the global COVID-19 pandemic, which necessitated consideration that the patient might be at risk for infection with the SARS-CoV-2 virus that causes COVID-19. Institutional protocols and algorithms that pertain to the evaluation of patients at risk for COVID-19 are in a state of rapid change based on information released by regulatory bodies including the CDC and federal and state organizations. These policies and algorithms were followed during the patient's care in the ED.      Final Clinical Impressions(s) / ED Diagnoses   Final diagnoses:  Generalized weakness  Myalgia    ED Discharge Orders    None       Hayden Rasmussen, MD 02/26/19 [phone]

## 2019-02-26 NOTE — ED Triage Notes (Addendum)
Pt arrives to ED from home with complaints of generalized weakness and stiffness for the last two weeks. Patient states she has dry cough but no fevers or body aches. Patient does dialysis T,TH,S. Has not missed any treatments.

## 2019-02-26 NOTE — Discharge Instructions (Signed)
You were seen in the emergency department for muscle stiffness and generalized weakness.  You had blood work EKG and a chest x-ray that did not show any obvious cause of your symptoms.  We are starting you on prednisone which may help.  Please follow-up with your primary care doctor as scheduled.

## 2019-02-26 NOTE — ED Notes (Signed)
Patient verbalizes understanding of discharge instructions. Opportunity for questioning and answers were provided. Armband removed by staff, pt discharged from ED via wheelchair to home.  

## 2019-02-28 ENCOUNTER — Emergency Department (HOSPITAL_COMMUNITY): Payer: Medicare Other | Admitting: Anesthesiology

## 2019-02-28 ENCOUNTER — Encounter (HOSPITAL_COMMUNITY): Payer: Self-pay | Admitting: Emergency Medicine

## 2019-02-28 ENCOUNTER — Ambulatory Visit (HOSPITAL_COMMUNITY)
Admission: EM | Admit: 2019-02-28 | Discharge: 2019-02-28 | Disposition: A | Discharge status: Home / Self Care | Payer: Medicare Other | Attending: Emergency Medicine | Admitting: Emergency Medicine

## 2019-02-28 ENCOUNTER — Encounter (HOSPITAL_COMMUNITY)
Admission: EM | Disposition: A | Discharge status: Home / Self Care | Payer: Self-pay | Source: Home / Self Care | Attending: Emergency Medicine

## 2019-02-28 DIAGNOSIS — E785 Hyperlipidemia, unspecified: Secondary | ICD-10-CM | POA: Insufficient documentation

## 2019-02-28 DIAGNOSIS — Z992 Dependence on renal dialysis: Secondary | ICD-10-CM | POA: Insufficient documentation

## 2019-02-28 DIAGNOSIS — I4892 Unspecified atrial flutter: Secondary | ICD-10-CM | POA: Insufficient documentation

## 2019-02-28 DIAGNOSIS — Z8673 Personal history of transient ischemic attack (TIA), and cerebral infarction without residual deficits: Secondary | ICD-10-CM | POA: Insufficient documentation

## 2019-02-28 DIAGNOSIS — Z905 Acquired absence of kidney: Secondary | ICD-10-CM | POA: Insufficient documentation

## 2019-02-28 DIAGNOSIS — X58XXXA Exposure to other specified factors, initial encounter: Secondary | ICD-10-CM | POA: Insufficient documentation

## 2019-02-28 DIAGNOSIS — T82838A Hemorrhage of vascular prosthetic devices, implants and grafts, initial encounter: Secondary | ICD-10-CM | POA: Diagnosis not present

## 2019-02-28 DIAGNOSIS — I4510 Unspecified right bundle-branch block: Secondary | ICD-10-CM | POA: Insufficient documentation

## 2019-02-28 DIAGNOSIS — Z20828 Contact with and (suspected) exposure to other viral communicable diseases: Secondary | ICD-10-CM | POA: Diagnosis not present

## 2019-02-28 DIAGNOSIS — M1990 Unspecified osteoarthritis, unspecified site: Secondary | ICD-10-CM | POA: Insufficient documentation

## 2019-02-28 DIAGNOSIS — Z79890 Hormone replacement therapy (postmenopausal): Secondary | ICD-10-CM | POA: Insufficient documentation

## 2019-02-28 DIAGNOSIS — T82590A Other mechanical complication of surgically created arteriovenous fistula, initial encounter: Secondary | ICD-10-CM

## 2019-02-28 DIAGNOSIS — M109 Gout, unspecified: Secondary | ICD-10-CM | POA: Diagnosis not present

## 2019-02-28 DIAGNOSIS — Z7901 Long term (current) use of anticoagulants: Secondary | ICD-10-CM | POA: Diagnosis not present

## 2019-02-28 DIAGNOSIS — N186 End stage renal disease: Secondary | ICD-10-CM | POA: Insufficient documentation

## 2019-02-28 DIAGNOSIS — G4730 Sleep apnea, unspecified: Secondary | ICD-10-CM | POA: Insufficient documentation

## 2019-02-28 DIAGNOSIS — Z79899 Other long term (current) drug therapy: Secondary | ICD-10-CM | POA: Insufficient documentation

## 2019-02-28 DIAGNOSIS — I120 Hypertensive chronic kidney disease with stage 5 chronic kidney disease or end stage renal disease: Secondary | ICD-10-CM | POA: Diagnosis not present

## 2019-02-28 DIAGNOSIS — I350 Nonrheumatic aortic (valve) stenosis: Secondary | ICD-10-CM | POA: Diagnosis not present

## 2019-02-28 DIAGNOSIS — N2581 Secondary hyperparathyroidism of renal origin: Secondary | ICD-10-CM | POA: Insufficient documentation

## 2019-02-28 DIAGNOSIS — G4733 Obstructive sleep apnea (adult) (pediatric): Secondary | ICD-10-CM | POA: Diagnosis not present

## 2019-02-28 DIAGNOSIS — I480 Paroxysmal atrial fibrillation: Secondary | ICD-10-CM | POA: Diagnosis not present

## 2019-02-28 HISTORY — PX: THROMBECTOMY AND REVISION OF ARTERIOVENTOUS (AV) GORETEX  GRAFT: SHX6120

## 2019-02-28 HISTORY — PX: AV FISTULA PLACEMENT: SHX1204

## 2019-02-28 LAB — CBC WITH DIFFERENTIAL/PLATELET
Abs Immature Granulocytes: 0 10*3/uL (ref 0.00–0.07)
Basophils Absolute: 0 10*3/uL (ref 0.0–0.1)
Basophils Relative: 1 %
Eosinophils Absolute: 0 10*3/uL (ref 0.0–0.5)
Eosinophils Relative: 0 %
HCT: 36.2 % (ref 36.0–46.0)
Hemoglobin: 11.8 g/dL — ABNORMAL LOW (ref 12.0–15.0)
Immature Granulocytes: 0 %
Lymphocytes Relative: 25 %
Lymphs Abs: 1.4 10*3/uL (ref 0.7–4.0)
MCH: 32.6 pg (ref 26.0–34.0)
MCHC: 32.6 g/dL (ref 30.0–36.0)
MCV: 100 fL (ref 80.0–100.0)
Monocytes Absolute: 0.5 10*3/uL (ref 0.1–1.0)
Monocytes Relative: 8 %
Neutro Abs: 3.8 10*3/uL (ref 1.7–7.7)
Neutrophils Relative %: 66 %
Platelets: 145 10*3/uL — ABNORMAL LOW (ref 150–400)
RBC: 3.62 MIL/uL — ABNORMAL LOW (ref 3.87–5.11)
RDW: 13.3 % (ref 11.5–15.5)
WBC: 5.7 10*3/uL (ref 4.0–10.5)
nRBC: 0 % (ref 0.0–0.2)

## 2019-02-28 LAB — COMPREHENSIVE METABOLIC PANEL
ALT: 28 U/L (ref 0–44)
AST: 31 U/L (ref 15–41)
Albumin: 4 (ref 3.5–5.0)
Albumin: 4 g/dL (ref 3.5–5.0)
Alkaline Phosphatase: 103 U/L (ref 38–126)
Anion gap: 15 (ref 5–15)
BUN: 9 mg/dL (ref 8–23)
CO2: 29 mmol/L (ref 22–32)
Calcium: 9.1 mg/dL (ref 8.9–10.3)
Chloride: 94 mmol/L — ABNORMAL LOW (ref 98–111)
Creatinine, Ser: 1.94 mg/dL — ABNORMAL HIGH (ref 0.44–1.00)
GFR calc Af Amer: 25 mL/min — ABNORMAL LOW (ref >60)
GFR calc non Af Amer: 21 mL/min — ABNORMAL LOW (ref >60)
Glucose, Bld: 100 mg/dL — ABNORMAL HIGH (ref 70–99)
Potassium: 3.1 mmol/L — ABNORMAL LOW (ref 3.5–5.1)
Sodium: 138 mmol/L (ref 135–145)
Total Bilirubin: 0.4 mg/dL (ref 0.3–1.2)
Total Protein: 6.8 g/dL (ref 6.5–8.1)

## 2019-02-28 LAB — TYPE AND SCREEN
ABO/RH(D): A POS
Antibody Screen: NEGATIVE

## 2019-02-28 LAB — SARS CORONAVIRUS 2 BY RT PCR (HOSPITAL ORDER, PERFORMED IN ~~LOC~~ HOSPITAL LAB): SARS Coronavirus 2: NEGATIVE

## 2019-02-28 LAB — HEPATIC FUNCTION PANEL
ALT: 28 (ref 7–35)
AST: 31 (ref 13–35)
Alkaline Phosphatase: 103 (ref 25–125)
Bilirubin, Total: 0.4

## 2019-02-28 LAB — PROTIME-INR
INR: 1.9 — ABNORMAL HIGH (ref 0.8–1.2)
INR: 2.1 — AB (ref 0.9–1.1)
Prothrombin Time: 21.4 seconds — ABNORMAL HIGH (ref 11.4–15.2)

## 2019-02-28 SURGERY — INSERTION OF ARTERIOVENOUS (AV) GORE-TEX GRAFT THIGH
Anesthesia: General | Site: Arm Lower | Laterality: Right

## 2019-02-28 MED ORDER — GLYCOPYRROLATE PF 0.2 MG/ML IJ SOSY
PREFILLED_SYRINGE | INTRAMUSCULAR | Status: DC | PRN
  Administered 2019-02-28: .1 mg via INTRAVENOUS

## 2019-02-28 MED ORDER — EPHEDRINE SULFATE-NACL 50-0.9 MG/10ML-% IV SOSY
PREFILLED_SYRINGE | INTRAVENOUS | Status: DC | PRN
  Administered 2019-02-28: 15 mg via INTRAVENOUS
  Administered 2019-02-28: 10 mg via INTRAVENOUS

## 2019-02-28 MED ORDER — OXYCODONE HCL 5 MG PO TABS: 5.0000 mg | ORAL_TABLET | ORAL | 0 refills | Status: DC | PRN

## 2019-02-28 MED ORDER — DEXAMETHASONE SODIUM PHOSPHATE 10 MG/ML IJ SOLN
INTRAMUSCULAR | Status: DC | PRN
  Administered 2019-02-28: 5 mg via INTRAVENOUS

## 2019-02-28 MED ORDER — LIDOCAINE-EPINEPHRINE (PF) 1 %-1:200000 IJ SOLN
INTRAMUSCULAR | Status: AC
  Filled 2019-02-28: qty 30

## 2019-02-28 MED ORDER — THROMBIN (RECOMBINANT) 20000 UNITS EX SOLR
CUTANEOUS | Status: AC
  Filled 2019-02-28: qty 20000

## 2019-02-28 MED ORDER — LIDOCAINE 2% (20 MG/ML) 5 ML SYRINGE
INTRAMUSCULAR | Status: DC | PRN
  Administered 2019-02-28: 60 mg via INTRAVENOUS

## 2019-02-28 MED ORDER — ONDANSETRON HCL 4 MG/2ML IJ SOLN
INTRAMUSCULAR | Status: DC | PRN
  Administered 2019-02-28: 4 mg via INTRAVENOUS

## 2019-02-28 MED ORDER — FENTANYL CITRATE (PF) 250 MCG/5ML IJ SOLN
INTRAMUSCULAR | Status: AC
  Filled 2019-02-28: qty 5

## 2019-02-28 MED ORDER — LIDOCAINE HCL 1 % IJ SOLN
INTRAMUSCULAR | Status: DC | PRN
  Administered 2019-02-28: 30 mL

## 2019-02-28 MED ORDER — GLYCOPYRROLATE PF 0.2 MG/ML IJ SOSY
PREFILLED_SYRINGE | INTRAMUSCULAR | Status: AC
  Filled 2019-02-28: qty 1

## 2019-02-28 MED ORDER — HEPARIN SODIUM (PORCINE) 1000 UNIT/ML IJ SOLN
INTRAMUSCULAR | Status: AC
  Filled 2019-02-28: qty 1

## 2019-02-28 MED ORDER — SODIUM CHLORIDE 0.9 % IV SOLN
INTRAVENOUS | Status: AC
  Filled 2019-02-28: qty 1.2

## 2019-02-28 MED ORDER — FENTANYL CITRATE (PF) 100 MCG/2ML IJ SOLN
INTRAMUSCULAR | Status: DC | PRN
  Administered 2019-02-28: 50 ug via INTRAVENOUS

## 2019-02-28 MED ORDER — CEFAZOLIN SODIUM-DEXTROSE 1-4 GM/50ML-% IV SOLN
INTRAVENOUS | Status: DC | PRN
  Administered 2019-02-28: 1 g via INTRAVENOUS

## 2019-02-28 MED ORDER — SODIUM CHLORIDE 0.9 % IV SOLN
INTRAVENOUS | Status: DC | PRN
  Administered 2019-02-28: 18:00:00 25 ug/min via INTRAVENOUS

## 2019-02-28 MED ORDER — PROPOFOL 10 MG/ML IV BOLUS
INTRAVENOUS | Status: DC | PRN
  Administered 2019-02-28: 100 mg via INTRAVENOUS

## 2019-02-28 MED ORDER — SUCCINYLCHOLINE CHLORIDE 20 MG/ML IJ SOLN
INTRAMUSCULAR | Status: DC | PRN
  Administered 2019-02-28: 50 mg via INTRAVENOUS

## 2019-02-28 MED ORDER — PROPOFOL 10 MG/ML IV BOLUS
INTRAVENOUS | Status: AC
  Filled 2019-02-28: qty 20

## 2019-02-28 MED ORDER — 0.9 % SODIUM CHLORIDE (POUR BTL) OPTIME
TOPICAL | Status: DC | PRN
  Administered 2019-02-28: 1000 mL

## 2019-02-28 MED ORDER — SODIUM CHLORIDE 0.9 % IV SOLN
INTRAVENOUS | Status: DC | PRN
  Administered 2019-02-28: 18:00:00 via INTRAVENOUS

## 2019-02-28 MED ORDER — SODIUM CHLORIDE 0.9 % IV SOLN
INTRAVENOUS | Status: DC | PRN
  Administered 2019-02-28: 500 mL

## 2019-02-28 MED ORDER — LIDOCAINE HCL (PF) 1 % IJ SOLN
INTRAMUSCULAR | Status: AC
  Filled 2019-02-28: qty 30

## 2019-02-28 MED ORDER — CEFAZOLIN SODIUM 1 G IJ SOLR
INTRAMUSCULAR | Status: AC
  Filled 2019-02-28: qty 10

## 2019-02-28 SURGICAL SUPPLY — 55 items
BAG DECANTER FOR FLEXI CONT (MISCELLANEOUS) ×5 IMPLANT
BIOPATCH RED 1 DISK 7.0 (GAUZE/BANDAGES/DRESSINGS) IMPLANT
BIOPATCH RED 1IN DISK 7.0MM (GAUZE/BANDAGES/DRESSINGS)
CANISTER SUCT 3000ML PPV (MISCELLANEOUS) ×5 IMPLANT
CANNULA VESSEL 3MM 2 BLNT TIP (CANNULA) ×5 IMPLANT
CATH EMB 4FR 80CM (CATHETERS) ×5 IMPLANT
CATH PALINDROME RT-P 15FX19CM (CATHETERS) IMPLANT
CATH PALINDROME RT-P 15FX23CM (CATHETERS) IMPLANT
CATH PALINDROME RT-P 15FX28CM (CATHETERS) IMPLANT
CATH PALINDROME RT-P 15FX55CM (CATHETERS) IMPLANT
CHLORAPREP W/TINT 26 (MISCELLANEOUS) IMPLANT
CLIP VESOCCLUDE MED 6/CT (CLIP) ×5 IMPLANT
CLIP VESOCCLUDE SM WIDE 6/CT (CLIP) ×5 IMPLANT
COVER PROBE W GEL 5X96 (DRAPES) IMPLANT
COVER SURGICAL LIGHT HANDLE (MISCELLANEOUS) IMPLANT
COVER WAND RF STERILE (DRAPES) IMPLANT
DERMABOND ADVANCED (GAUZE/BANDAGES/DRESSINGS) ×2
DERMABOND ADVANCED .7 DNX12 (GAUZE/BANDAGES/DRESSINGS) ×3 IMPLANT
DRAPE C-ARM 42X72 X-RAY (DRAPES) IMPLANT
DRAPE CHEST BREAST 15X10 FENES (DRAPES) IMPLANT
DRAPE INCISE IOBAN 66X45 STRL (DRAPES) IMPLANT
DRSG TEGADERM 2-3/8X2-3/4 SM (GAUZE/BANDAGES/DRESSINGS) ×5 IMPLANT
ELECT REM PT RETURN 9FT ADLT (ELECTROSURGICAL) ×5
ELECTRODE REM PT RTRN 9FT ADLT (ELECTROSURGICAL) ×3 IMPLANT
GAUZE 4X4 16PLY RFD (DISPOSABLE) IMPLANT
GAUZE SPONGE 2X2 8PLY STRL LF (GAUZE/BANDAGES/DRESSINGS) ×3 IMPLANT
GLOVE BIO SURGEON STRL SZ7.5 (GLOVE) ×5 IMPLANT
GLOVE BIOGEL PI IND STRL 8 (GLOVE) ×9 IMPLANT
GLOVE BIOGEL PI INDICATOR 8 (GLOVE) ×6
GOWN STRL REUS W/ TWL LRG LVL3 (GOWN DISPOSABLE) ×9 IMPLANT
GOWN STRL REUS W/TWL LRG LVL3 (GOWN DISPOSABLE) ×6
KIT BASIN OR (CUSTOM PROCEDURE TRAY) ×5 IMPLANT
KIT TURNOVER KIT B (KITS) ×5 IMPLANT
NEEDLE 18GX1X1/2 (RX/OR ONLY) (NEEDLE) ×5 IMPLANT
NEEDLE HYPO 25GX1X1/2 BEV (NEEDLE) ×5 IMPLANT
NS IRRIG 1000ML POUR BTL (IV SOLUTION) ×5 IMPLANT
PACK CV ACCESS (CUSTOM PROCEDURE TRAY) ×5 IMPLANT
PACK SURGICAL SETUP 50X90 (CUSTOM PROCEDURE TRAY) IMPLANT
PAD ARMBOARD 7.5X6 YLW CONV (MISCELLANEOUS) ×10 IMPLANT
SPONGE GAUZE 2X2 STER 10/PKG (GAUZE/BANDAGES/DRESSINGS) ×2
SPONGE SURGIFOAM ABS GEL 100 (HEMOSTASIS) IMPLANT
SUT ETHILON 3 0 PS 1 (SUTURE) ×5 IMPLANT
SUT PROLENE 6 0 BV (SUTURE) ×10 IMPLANT
SUT VIC AB 2-0 CTB1 (SUTURE) ×5 IMPLANT
SUT VIC AB 3-0 SH 27 (SUTURE) ×4
SUT VIC AB 3-0 SH 27X BRD (SUTURE) ×6 IMPLANT
SUT VICRYL 4-0 PS2 18IN ABS (SUTURE) ×10 IMPLANT
SYR 10ML LL (SYRINGE) IMPLANT
SYR 20ML LL LF (SYRINGE) ×10 IMPLANT
SYR 5ML LL (SYRINGE) ×5 IMPLANT
SYR CONTROL 10ML LL (SYRINGE) ×5 IMPLANT
TOWEL GREEN STERILE (TOWEL DISPOSABLE) ×10 IMPLANT
TOWEL GREEN STERILE FF (TOWEL DISPOSABLE) ×5 IMPLANT
UNDERPAD 30X30 (UNDERPADS AND DIAPERS) ×5 IMPLANT
WATER STERILE IRR 1000ML POUR (IV SOLUTION) ×5 IMPLANT

## 2019-02-28 NOTE — Op Note (Signed)
    NAME: [name]    MRN: [id number] DOB: Feb 04, 1922    DATE OF OPERATION: 02/28/2019  PREOP DIAGNOSIS:    Bleeding from right forearm AV graft  POSTOP DIAGNOSIS:    Same  PROCEDURE:    Repair of AV graft for bleeding Thrombectomy right forearm AV graft  SURGEON: Judeth Cornfield. Scot Dock, MD  ASSIST: None  ANESTHESIA: General  EBL: Minimal  INDICATIONS:    [name] is a 85 y.o. female who dialyzes on Tuesdays Thursdays and Saturdays.  She has had a right forearm graft for approximately 10 years according to the patient.  She had bleeding after dialysis today and presented to the emergency department.  She was taken urgently to the operating room for repair.   FINDINGS:   The pressure dressing has been on for some time and at the beginning of the case there was no thrill in the fistula.  She was hypotensive also.  When the dressing was removed there was no bleeding from the site.  I did place two 3-0 nylon sutures here so that after thrombectomy there would not be bleeding from the site.  There was a good thrill at the completion of the procedure.  TECHNIQUE:   The patient was taken to the operating room and received a general anesthetic.  She had eaten at noon.  We placed a tourniquet on the upper arm and then gently remove the dressing where there had been significant bleeding.  At this point there was no thrill in the graft although the patient's blood pressure was low (systolic pressure 80).  At this point there was no bleeding from the site.  The right forearm up to the tourniquet was prepped and draped in usual sterile fashion.  In anticipation of bleeding after thrombectomy I placed two 3-0 nylon sutures at the 2 cannulation sites which had been bleeding.  Next a longitudinal incision was made over the graft just below the antecubital level on the arterial side of the graft which was along the ulnar aspect of the forearm.  I did not heparinize as the INR was  1.9.  The graft was dissected free.  A transverse arteriotomy was made.  I passed the Fogarty catheter proximally and there was excellent inflow.  I did pass the catheter distally and there was irregularity within the graft with good backbleeding.  No clot was retrieved.  The arteriotomy was closed with running 6-0 Prolene suture.  At this point the pressure was improved and there was a good thrill in the fistula.  I therefore elected not to place a catheter.  Hemostasis was obtained in the wound and the wound was closed with a deep layer of 3-0 Vicryl and the skin closed with 4-0 Vicryl.  Dermabond was applied.  The patient tolerated the procedure well and was transferred to the recovery room in stable condition.  All needle and sponge counts were correct.  [name], MD, FACS [facility]  DATE OF DICTATION:   02/28/2019

## 2019-02-28 NOTE — ED Notes (Signed)
Called report to short stay however they feel pt should go from ER directly to OR. They state they will call OR to find out.

## 2019-02-28 NOTE — ED Notes (Signed)
Reapplied clamp to bleeding graft due to reoccurrence of bleeding 4x4 applied also- bleeding controlled at this time will monitor.

## 2019-02-28 NOTE — Transfer of Care (Signed)
Immediate Anesthesia Transfer of Care Note  Patient: [name]  Procedure(s) Performed: REPAIR  FOREARM ARTERIOVENOUS (AV) GORE-TEX GRAFT FOR BLEEDING (Right ) Thrombectomy And Revision Of Arterioventous (Av) Goretex  Graft (Right Arm Lower)  Patient Location: PACU  Anesthesia Type:General  Level of Consciousness: lethargic and responds to stimulation  Airway & Oxygen Therapy: Patient Spontanous Breathing  Post-op Assessment: Report given to RN  Post vital signs: Reviewed and stable  Last Vitals:  Vitals Value Taken Time  BP 108/29 02/28/19 1854  Temp    Pulse 88 02/28/19 1855  Resp 21 02/28/19 1855  SpO2 92 % 02/28/19 1855  Vitals shown include unvalidated device data.  Last Pain:  Vitals:   02/28/19 1610  TempSrc: Oral         Complications: No apparent anesthesia complications

## 2019-02-28 NOTE — ED Notes (Addendum)
MD dickson at bedside. Removed clamp heavy bleeding from graft- was able to control with 4x4 and clamp.

## 2019-02-28 NOTE — H&P (View-Only) (Signed)
Patient name: [name] MRN: [id number] DOB: [birth year] Sex: female  REASON FOR CONSULT:   Bleeding from right forearm AV graft.  The consult is requested by the emergency department.  HPI:   [name] is a pleasant 85 y.o. female who dialyzes on Tuesdays Thursdays and Saturdays.  She went for dialysis today and when they remove the needle about 2:00 she had significant bleeding from the right forearm.  They were unable to stop this and she was sent to the emergency department.  She currently has a pressure dressing in place.  Of note she is on Coumadin for atrial fibrillation.  INR is 1.9.  It looks like she was last seen in our office on 12/14/2015.  She had presented with several episodes of bleeding from her right forearm graft.  She had an ulcer and degeneration of the skin overlying the graft and required revision of her right forearm graft with an interposition 6 mm PTFE segment.  A degenerative section was resected.  Patient tells me this graft has been in for about 10 years.  The patient states that she had her full dialysis treatment today.   No current facility-administered medications for this encounter.    Current Outpatient Medications  Medication Sig Dispense Refill  . acetaminophen (TYLENOL) 500 MG tablet Take 500 mg by mouth every 6 (six) hours as needed.    Marland Kitchen amiodarone (PACERONE) 200 MG tablet TAKE 1/2 TABLET AT BEDTIME. 45 tablet 3  . multivitamin (RENA-VIT) TABS tablet Take 1 tablet by mouth daily.    . Nutritional Supplements (FEEDING SUPPLEMENT, NEPRO CARB STEADY,) LIQD Take 237 mLs by mouth 2 (two) times daily between meals. 60 Can 0  . predniSONE (DELTASONE) 20 MG tablet Take 1 tablet (20 mg total) by mouth daily. 4 tablet 0  . SENSIPAR 30 MG tablet Take 30 mg by mouth at bedtime.     Marland Kitchen warfarin (COUMADIN) 2.5 MG tablet TAKE 1 TO 1&1/2 TABLETS DAILY AS DIRECTED BY COUMADIN CLINIC. 135 tablet 0    REVIEW OF SYSTEMS:  [X]  denotes positive finding, [  ] denotes negative finding Vascular    Leg swelling    Cardiac    Chest pain or chest pressure:    Shortness of breath upon exertion:    Short of breath when lying flat:    Irregular heart rhythm:    Constitutional    Fever or chills:     PHYSICAL EXAM:   Vitals:   02/28/19 1610  BP: (!) 122/51  Pulse: 81  Resp: 16  Temp: 97.8 F (36.6 C)  TempSrc: Oral  SpO2: 100%    GENERAL: The patient is a well-nourished female, in no acute distress. The vital signs are documented above. CARDIOVASCULAR: There is a regular rate and rhythm. PULMONARY: There is good air exchange bilaterally without wheezing or rales. She has a right forearm graft which is pulsatile.  I remove the dressing and there is significant bleeding from the cannulation site.  I was able to reapply a pressure dressing to get this controlled so she can be taken to the operating room for repair.  DATA:   LABS:  Potassium 3.1  Hemoglobin 11.8  INR 1.9  MEDICAL ISSUES:   BLEEDING FROM RIGHT FOREARM AV GRAFT: This patient has bleeding from her right forearm AV graft.  I have recommended emergent exploration of the graft in the operating room.  Hopefully we can simply suture this to control the bleeding without having to revise  the graft.  However, the graft has been in for 10 years and has had multiple revisions and thrombectomies.  I have explained that if we cannot salvage the graft there is a small chance that we would have to place a tunneled dialysis catheter for dialysis.  I also explained that as possible we will have to revise her graft.  I discussed the indications of the procedure the potential complications with her and she is agreeable to proceed.  She drank a nutrition shake at noon but I think we need to proceed urgently.  [name] [facility] [phone]

## 2019-02-28 NOTE — Anesthesia Preprocedure Evaluation (Addendum)
Anesthesia Evaluation  Patient identified by MRN, date of birth, ID band Patient awake    Reviewed: Allergy & Precautions, NPO status , Patient's Chart, lab work & pertinent test resultsPreop documentation limited or incomplete due to emergent nature of procedure.  Airway Mallampati: II  TM Distance: >3 FB Neck ROM: Full    Dental  (+) Partial Lower, Partial Upper   Pulmonary sleep apnea ,    Pulmonary exam normal breath sounds clear to auscultation       Cardiovascular hypertension, Normal cardiovascular exam+ dysrhythmias Atrial Fibrillation  Rhythm:Regular Rate:Normal  ECG: rate 60. Sinus rhythm Borderline prolonged PR interval Right bundle branch block   Neuro/Psych TIA   GI/Hepatic negative GI ROS, Neg liver ROS,   Endo/Other  negative endocrine ROS  Renal/GU ESRF and DialysisRenal disease     Musculoskeletal  (+) Arthritis , Gout    Abdominal   Peds  Hematology  (+) anemia ,   Anesthesia Other Findings ESRD  Reproductive/Obstetrics                            Anesthesia Physical Anesthesia Plan  ASA: IV and emergent  Anesthesia Plan: General   Post-op Pain Management:    Induction: Intravenous and Rapid sequence  PONV Risk Score and Plan: 3 and Ondansetron, Dexamethasone and Treatment may vary due to age or medical condition  Airway Management Planned: Oral ETT  Additional Equipment:   Intra-op Plan:   Post-operative Plan: Extubation in OR  Informed Consent: I have reviewed the patients History and Physical, chart, labs and discussed the procedure including the risks, benefits and alternatives for the proposed anesthesia with the patient or authorized representative who has indicated his/her understanding and acceptance.     Dental advisory given and Only emergency history available  Plan Discussed with: CRNA  Anesthesia Plan Comments:        Anesthesia Quick  Evaluation

## 2019-02-28 NOTE — ED Provider Notes (Signed)
[facility] EMERGENCY DEPARTMENT Provider Note   CSN: [id number] Arrival date & time: 02/28/19  1555     History   Chief Complaint No chief complaint on file.   HPI [name] is a 85 y.o. female.     Patient 86 year old female with past medical history as below presents emergency department for evaluation of right upper extremity dialysis access fistula bleeding.  This occurred when she was deaccessed from dialysis approximately half an hour ago.  Bleeding was immediately controlled with direct pressure prior to arrival patient arrives with a compression clip applied to the wound which is hemostatic currently.  Patient states that she has not had any presyncopal symptoms and did receive her full course of dialysis treatment.     Past Medical History:  Diagnosis Date  . Anemia   . Aortic stenosis   . Benign bladder mass   . Chronic cough   . DJD (degenerative joint disease)   . Dyslipidemia   . ESRD on hemodialysis ([place])    "TTS; [facility]" (09/27/2016)  . Gout   . HTN (hypertension)   . Obstructive sleep apnea    mild  . Orthostatic syncope    around year 2006  . Pneumonia   . TIA (transient ischemic attack)     Patient Active Problem List   Diagnosis Date Noted  . Educated about COVID-19 virus infection 09/21/2018  . Syncope 04/25/2017  . Gastritis 04/25/2017  . Closed fracture of one rib of right side   . Hypotension 02/10/2017  . Lactic acidosis 02/10/2017  . Bradycardia 10/29/2016  . Acute respiratory failure ([place]) 09/27/2016  . Atypical chest pain 09/27/2016  . Essential hypertension 09/27/2016  . Acute pulmonary edema (H[facility])   . Other chest pain   . Coumadin Rx 09/07/2016  . Monitoring for long-term anticoagulant use 08/29/2016  . Atrial flutter ([place])   . PAF (paroxysmal atrial fibrillation) ([place]) 08/22/2016  . Atrial fibrillation with RVR ([place]) 08/20/2016  . Severe aortic stenosis 08/20/2016  . New onset a-fib ([place])  08/20/2016  . Staphylococcus aureus bacteremia   . Bleeding pseudoaneurysm of right brachiocephalic arteriovenous fistula ([place]) 11/28/2015  . Bleeding from dialysis shunt ([place])   . Nausea   . Fever in adult 11/27/2015  . Hemorrhage of arteriovenous graft ([place]) 11/27/2015  . HLD (hyperlipidemia) 11/27/2015  . Bleeding 11/27/2015  . Influenza A with respiratory manifestations 05/08/2013  . Cough 05/08/2013  . Chronic cough 02/15/2013  . Secondary hyperparathyroidism ([place]) 11/21/2012  . ESRD on dialysis [facility]) 11/20/2012    Past Surgical History:  Procedure Laterality Date  . BONE MARROW BIOPSY    . CATARACT EXTRACTION W/ INTRAOCULAR LENS  IMPLANT, BILATERAL    . COLONOSCOPY W/ BIOPSIES AND POLYPECTOMY    . left arm dialysis graft    . NEPHRECTOMY     left  . REVISION OF ARTERIOVENOUS GORETEX GRAFT Right 11/24/2014   Procedure: REPLACEMENT OF  MEDIAL SIDE OF RIGHT FORARM ARTERIOVENOUS GORETEX GRAFT;  Surgeon: Conrad Neabsco, MD;  Location: [facility];  Service: Vascular;  Laterality: Right;  . REVISION OF ARTERIOVENOUS GORETEX GRAFT Right 11/28/2015   Procedure: REVISION OF RIGHT ARM ARTERIOVENOUS GORETEX GRAFT;  Surgeon: Serafina Mitchell, MD;  Location: [facility];  Service: Vascular;  Laterality: Right;  . TOTAL ABDOMINAL HYSTERECTOMY       OB History   No obstetric history on file.      Home Medications    Prior to Admission medications   Medication  Sig Start Date End Date Taking? Authorizing Provider  acetaminophen (TYLENOL) 500 MG tablet Take 500 mg by mouth every 6 (six) hours as needed.    [provider]  amiodarone (PACERONE) 200 MG tablet TAKE 1/2 TABLET AT BEDTIME. 11/01/18   Minus Breeding, MD  multivitamin (RENA-VIT) TABS tablet Take 1 tablet by mouth daily.    [provider]  Nutritional Supplements (FEEDING SUPPLEMENT, NEPRO CARB STEADY,) LIQD Take 237 mLs by mouth 2 (two) times daily between meals. 02/10/17   Mariel Aloe, MD  predniSONE (DELTASONE) 20  MG tablet Take 1 tablet (20 mg total) by mouth daily. 02/26/19   Hayden Rasmussen, MD  SENSIPAR 30 MG tablet Take 30 mg by mouth at bedtime.  02/14/13   [provider]  warfarin (COUMADIN) 2.5 MG tablet TAKE 1 TO 1&1/2 TABLETS DAILY AS DIRECTED BY COUMADIN CLINIC. 11/12/18   Minus Breeding, MD  chlorpheniramine (CHLOR-TRIMETON) 4 MG tablet One pill at night as needed for cough 12/22/10 07/20/11  Chesley Mires, MD    Family History Family History  Problem Relation Age of Onset  . Hypertension Mother   . Diabetes Mother   . Hypertension Other     Social History Social History   Tobacco Use  . Smoking status: Never Smoker  . Smokeless tobacco: Never Used  Substance Use Topics  . Alcohol use: No    Alcohol/week: 0.0 standard drinks  . Drug use: No     Allergies   Tape and Sulfa antibiotics   Review of Systems Review of Systems  Constitutional: Negative for chills and fever.  HENT: Negative for ear pain and sore throat.   Eyes: Negative for pain and visual disturbance.  Respiratory: Negative for cough and shortness of breath.   Cardiovascular: Negative for chest pain and palpitations.  Gastrointestinal: Negative for abdominal pain and vomiting.  Genitourinary: Negative for dysuria and hematuria.  Musculoskeletal: Negative for arthralgias and back pain.  Skin: Positive for wound. Negative for color change and rash.  Neurological: Negative for seizures and syncope.  All other systems reviewed and are negative.    Physical Exam Updated Vital Signs There were no vitals taken for this visit.  Physical Exam Vitals signs and nursing note reviewed.  Constitutional:      General: She is in acute distress.     Appearance: She is well-developed.  HENT:     Head: Normocephalic and atraumatic.  Eyes:     Extraocular Movements: Extraocular movements intact.     Conjunctiva/sclera: Conjunctivae normal.  Neck:     Musculoskeletal: Neck supple.  Cardiovascular:      Rate and Rhythm: Normal rate and regular rhythm.     Heart sounds: No murmur.  Pulmonary:     Effort: Pulmonary effort is normal. No respiratory distress.     Breath sounds: Normal breath sounds.  Abdominal:     Palpations: Abdomen is soft.     Tenderness: There is no abdominal tenderness.  Skin:    General: Skin is warm and dry.     Capillary Refill: Capillary refill takes less than 2 seconds.     Comments: There is an ulceration approximately 2 cm in diameter in the right upper extremity dialysis access fistula which is profusely bleeding when not compressed.  Bleeding is controlled with direct pressure currently.  Pulses 2+ in all extremities.  Neurological:     General: No focal deficit present.     Mental Status: She is alert and oriented to person,  place, and time.     Cranial Nerves: No cranial nerve deficit.     Motor: Weakness (generalized) present.     Comments: Neurovascular intact distal to the injury.  Psychiatric:        Mood and Affect: Mood normal.      ED Treatments / Results  Labs (all labs ordered are listed, but only abnormal results are displayed) Labs Reviewed  CBC WITH DIFFERENTIAL/PLATELET  COMPREHENSIVE METABOLIC PANEL  PROTIME-INR  TYPE AND SCREEN    EKG None  Radiology No results found.  Procedures Procedures (including critical care time)  Medications Ordered in ED Medications - No data to display   Initial Impression / Assessment and Plan / ED Course  I have reviewed the triage vital signs and the nursing notes.  Pertinent labs & imaging results that were available during my care of the patient were reviewed by me and considered in my medical decision making (see chart for details).        Patient is a 85 y/o F coming from dialysis after an ulceration formed of her RUE fistula site. She has bleeding controlled with direct pressure currently but there is an approximately 2cm ulceration present. Vascular surgery was consulted  emergently and came to see the patient in the ED. Please see their note for further details. Briefly patient to be taken emergently to the OR by vascular surgery.  Patient to the OR in critical condition.  Labs including type and screen ordered.  Hemoglobin 11.8.  Blood pressure stable at approximately 122/51 in the emergency department.  Patient seen in conjunction with my attending physician, [place]  Final Clinical Impressions(s) / ED Diagnoses   Final diagnoses:  None    ED Discharge Orders    None       Romona Curls, MD 03/01/19 1626    Gareth Morgan, MD 03/02/19 1201

## 2019-02-28 NOTE — Anesthesia Procedure Notes (Signed)
Procedure Name: Intubation Date/Time: 02/28/2019 5:54 PM Performed by: Barrington Ellison, CRNA Pre-anesthesia Checklist: Patient identified, Emergency Drugs available, Suction available and Patient being monitored Patient Re-evaluated:Patient Re-evaluated prior to induction Oxygen Delivery Method: Circle System Utilized Preoxygenation: Pre-oxygenation with 100% oxygen Induction Type: IV induction and Rapid sequence Laryngoscope Size: Mac and 3 Grade View: Grade I Tube type: Oral Number of attempts: 1 Airway Equipment and Method: Stylet and Oral airway Placement Confirmation: ETT inserted through vocal cords under direct vision,  positive ETCO2 and breath sounds checked- equal and bilateral Secured at: 21 cm Tube secured with: Tape Dental Injury: Teeth and Oropharynx as per pre-operative assessment

## 2019-02-28 NOTE — ED Triage Notes (Signed)
Pt arrives from dialysis after having graft de accessed  after her full treatment she had large a mount of bleeding from site. Pt arrived to ed with a clamp in place which was controlling bleeding.  With resident at bedside and we removed clamp and graft was still bleeding very heavy- placed 4x4 and clamp back on per resident.

## 2019-02-28 NOTE — Consult Note (Signed)
Patient name: [name] MRN: [id number] DOB: Mar 16, 1922 Sex: female  REASON FOR CONSULT:   Bleeding from right forearm AV graft.  The consult is requested by the emergency department.  HPI:   [name] is a pleasant 85 y.o. female who dialyzes on Tuesdays Thursdays and Saturdays.  She went for dialysis today and when they remove the needle about 2:00 she had significant bleeding from the right forearm.  They were unable to stop this and she was sent to the emergency department.  She currently has a pressure dressing in place.  Of note she is on Coumadin for atrial fibrillation.  INR is 1.9.  It looks like she was last seen in our office on 12/14/2015.  She had presented with several episodes of bleeding from her right forearm graft.  She had an ulcer and degeneration of the skin overlying the graft and required revision of her right forearm graft with an interposition 6 mm PTFE segment.  A degenerative section was resected.  Patient tells me this graft has been in for about 10 years.  The patient states that she had her full dialysis treatment today.   No current facility-administered medications for this encounter.    Current Outpatient Medications  Medication Sig Dispense Refill  . acetaminophen (TYLENOL) 500 MG tablet Take 500 mg by mouth every 6 (six) hours as needed.    Marland Kitchen amiodarone (PACERONE) 200 MG tablet TAKE 1/2 TABLET AT BEDTIME. 45 tablet 3  . multivitamin (RENA-VIT) TABS tablet Take 1 tablet by mouth daily.    . Nutritional Supplements (FEEDING SUPPLEMENT, NEPRO CARB STEADY,) LIQD Take 237 mLs by mouth 2 (two) times daily between meals. 60 Can 0  . predniSONE (DELTASONE) 20 MG tablet Take 1 tablet (20 mg total) by mouth daily. 4 tablet 0  . SENSIPAR 30 MG tablet Take 30 mg by mouth at bedtime.     Marland Kitchen warfarin (COUMADIN) 2.5 MG tablet TAKE 1 TO 1&1/2 TABLETS DAILY AS DIRECTED BY COUMADIN CLINIC. 135 tablet 0    REVIEW OF SYSTEMS:  [X]  denotes positive finding, [  ] denotes negative finding Vascular    Leg swelling    Cardiac    Chest pain or chest pressure:    Shortness of breath upon exertion:    Short of breath when lying flat:    Irregular heart rhythm:    Constitutional    Fever or chills:     PHYSICAL EXAM:   Vitals:   02/28/19 1610  BP: (!) 122/51  Pulse: 81  Resp: 16  Temp: 97.8 F (36.6 C)  TempSrc: Oral  SpO2: 100%    GENERAL: The patient is a well-nourished female, in no acute distress. The vital signs are documented above. CARDIOVASCULAR: There is a regular rate and rhythm. PULMONARY: There is good air exchange bilaterally without wheezing or rales. She has a right forearm graft which is pulsatile.  I remove the dressing and there is significant bleeding from the cannulation site.  I was able to reapply a pressure dressing to get this controlled so she can be taken to the operating room for repair.  DATA:   LABS:  Potassium 3.1  Hemoglobin 11.8  INR 1.9  MEDICAL ISSUES:   BLEEDING FROM RIGHT FOREARM AV GRAFT: This patient has bleeding from her right forearm AV graft.  I have recommended emergent exploration of the graft in the operating room.  Hopefully we can simply suture this to control the bleeding without having to revise  the graft.  However, the graft has been in for 10 years and has had multiple revisions and thrombectomies.  I have explained that if we cannot salvage the graft there is a small chance that we would have to place a tunneled dialysis catheter for dialysis.  I also explained that as possible we will have to revise her graft.  I discussed the indications of the procedure the potential complications with her and she is agreeable to proceed.  She drank a nutrition shake at noon but I think we need to proceed urgently.  [name] [facility] [phone]

## 2019-03-01 ENCOUNTER — Encounter (HOSPITAL_COMMUNITY): Payer: Self-pay | Admitting: Vascular Surgery

## 2019-03-01 NOTE — Anesthesia Postprocedure Evaluation (Signed)
Anesthesia Post Note  Patient: [name]  Procedure(s) Performed: REPAIR  FOREARM ARTERIOVENOUS (AV) GORE-TEX GRAFT FOR BLEEDING (Right ) Thrombectomy And Revision Of Arterioventous (Av) Goretex  Graft (Right Arm Lower)     Patient location during evaluation: PACU Anesthesia Type: General Level of consciousness: awake Pain management: pain level controlled Vital Signs Assessment: post-procedure vital signs reviewed and stable Respiratory status: spontaneous breathing, nonlabored ventilation, respiratory function stable and patient connected to nasal cannula oxygen Cardiovascular status: blood pressure returned to baseline and stable Postop Assessment: no apparent nausea or vomiting Anesthetic complications: no    Last Vitals:  Vitals:   02/28/19 1909 02/28/19 1924  BP: (!) 104/27 (!) 100/35  Pulse: 90 85  Resp: (!) 21 16  Temp:  36.4 C  SpO2: 96% 95%    Last Pain:  Vitals:   02/28/19 1924  TempSrc:   PainSc: 0-No pain                 Ryan P Ellender

## 2019-03-05 ENCOUNTER — Other Ambulatory Visit: Payer: Self-pay | Admitting: *Deleted

## 2019-03-05 ENCOUNTER — Ambulatory Visit (INDEPENDENT_AMBULATORY_CARE_PROVIDER_SITE_OTHER): Payer: Medicare Other | Admitting: Pharmacist

## 2019-03-05 DIAGNOSIS — Z7901 Long term (current) use of anticoagulants: Secondary | ICD-10-CM

## 2019-03-05 DIAGNOSIS — I4891 Unspecified atrial fibrillation: Secondary | ICD-10-CM

## 2019-03-05 DIAGNOSIS — Z5181 Encounter for therapeutic drug level monitoring: Secondary | ICD-10-CM

## 2019-03-06 ENCOUNTER — Telehealth: Payer: Self-pay | Admitting: Pharmacist Clinician (PhC)/ Clinical Pharmacy Specialist

## 2019-03-06 NOTE — Telephone Encounter (Signed)
I had sent a message to the [facility]in Clinic to verify that it was OK to hold this patient's Coumadin x 3 days prior to her fistulogram by Dr. Scot Dock. Based on today's INR, is this possible? Does she need Lovenox bridging?   Leota Jacobsen, BSN, RN  Surgical / Triage Nurse  [facility]  [facility]   Received above message in staff message folder.  Patient with diagnosis of atrial fibrillation on warfarin for anticoagulation.    Procedure: fistulogram Date of procedure: 03/15/2019  CHADS2-VASc score of  4 (HTN, AGE x 2, female)  CrCl - on dialysis Platelet count 145  Per office protocol, patient can hold warfarin for 3 days prior to procedure.    Patient will not need bridging with Lovenox (enoxaparin) around procedure.

## 2019-03-12 ENCOUNTER — Other Ambulatory Visit (HOSPITAL_COMMUNITY): Payer: Medicare Other

## 2019-03-12 ENCOUNTER — Other Ambulatory Visit (HOSPITAL_COMMUNITY)
Admission: RE | Admit: 2019-03-12 | Discharge: 2019-03-12 | Disposition: A | Discharge status: Home / Self Care | Payer: Medicare Other | Source: Ambulatory Visit | Attending: Vascular Surgery | Admitting: Vascular Surgery

## 2019-03-12 DIAGNOSIS — Z20828 Contact with and (suspected) exposure to other viral communicable diseases: Secondary | ICD-10-CM | POA: Insufficient documentation

## 2019-03-12 DIAGNOSIS — Z01812 Encounter for preprocedural laboratory examination: Secondary | ICD-10-CM | POA: Diagnosis present

## 2019-03-13 LAB — NOVEL CORONAVIRUS, NAA (HOSP ORDER, SEND-OUT TO REF LAB; TAT 18-24 HRS): SARS-CoV-2, NAA: NOT DETECTED

## 2019-03-15 ENCOUNTER — Ambulatory Visit (HOSPITAL_COMMUNITY)
Admission: RE | Admit: 2019-03-15 | Discharge: 2019-03-15 | Disposition: A | Discharge status: Home / Self Care | Payer: Medicare Other | Attending: Vascular Surgery | Admitting: Vascular Surgery

## 2019-03-15 ENCOUNTER — Encounter (HOSPITAL_COMMUNITY)
Admission: RE | Disposition: A | Discharge status: Home / Self Care | Payer: Self-pay | Source: Home / Self Care | Attending: Vascular Surgery

## 2019-03-15 ENCOUNTER — Other Ambulatory Visit: Payer: Self-pay

## 2019-03-15 DIAGNOSIS — T82838A Hemorrhage of vascular prosthetic devices, implants and grafts, initial encounter: Secondary | ICD-10-CM

## 2019-03-15 DIAGNOSIS — E785 Hyperlipidemia, unspecified: Secondary | ICD-10-CM | POA: Diagnosis not present

## 2019-03-15 DIAGNOSIS — Z79899 Other long term (current) drug therapy: Secondary | ICD-10-CM | POA: Diagnosis not present

## 2019-03-15 DIAGNOSIS — Z992 Dependence on renal dialysis: Secondary | ICD-10-CM

## 2019-03-15 DIAGNOSIS — Z7901 Long term (current) use of anticoagulants: Secondary | ICD-10-CM | POA: Diagnosis not present

## 2019-03-15 DIAGNOSIS — Y841 Kidney dialysis as the cause of abnormal reaction of the patient, or of later complication, without mention of misadventure at the time of the procedure: Secondary | ICD-10-CM | POA: Insufficient documentation

## 2019-03-15 DIAGNOSIS — T82510A Breakdown (mechanical) of surgically created arteriovenous fistula, initial encounter: Secondary | ICD-10-CM | POA: Insufficient documentation

## 2019-03-15 DIAGNOSIS — I350 Nonrheumatic aortic (valve) stenosis: Secondary | ICD-10-CM | POA: Diagnosis not present

## 2019-03-15 DIAGNOSIS — I120 Hypertensive chronic kidney disease with stage 5 chronic kidney disease or end stage renal disease: Secondary | ICD-10-CM | POA: Diagnosis not present

## 2019-03-15 DIAGNOSIS — G4733 Obstructive sleep apnea (adult) (pediatric): Secondary | ICD-10-CM | POA: Diagnosis not present

## 2019-03-15 DIAGNOSIS — N186 End stage renal disease: Secondary | ICD-10-CM

## 2019-03-15 DIAGNOSIS — I4891 Unspecified atrial fibrillation: Secondary | ICD-10-CM | POA: Diagnosis not present

## 2019-03-15 HISTORY — PX: PERIPHERAL VASCULAR BALLOON ANGIOPLASTY: CATH118281

## 2019-03-15 LAB — POCT I-STAT, CHEM 8
BUN: 22 mg/dL (ref 8–23)
Calcium, Ion: 1.1 mmol/L — ABNORMAL LOW (ref 1.15–1.40)
Chloride: 93 mmol/L — ABNORMAL LOW (ref 98–111)
Creatinine, Ser: 4 mg/dL — ABNORMAL HIGH (ref 0.44–1.00)
Glucose, Bld: 83 mg/dL (ref 70–99)
HCT: 30 % — ABNORMAL LOW (ref 36.0–46.0)
Hemoglobin: 10.2 g/dL — ABNORMAL LOW (ref 12.0–15.0)
Potassium: 3.8 mmol/L (ref 3.5–5.1)
Sodium: 134 mmol/L — ABNORMAL LOW (ref 135–145)
TCO2: 31 mmol/L (ref 22–32)

## 2019-03-15 LAB — PROTIME-INR
INR: 1.2 (ref 0.8–1.2)
Prothrombin Time: 14.8 seconds (ref 11.4–15.2)

## 2019-03-15 SURGERY — PERIPHERAL VASCULAR BALLOON ANGIOPLASTY
Anesthesia: LOCAL | Laterality: Right

## 2019-03-15 MED ORDER — SODIUM CHLORIDE 0.9 % IV SOLN: 250.0000 mL | INTRAVENOUS | Status: DC | PRN

## 2019-03-15 MED ORDER — SODIUM CHLORIDE 0.9% FLUSH: 3.0000 mL | INTRAVENOUS | Status: DC | PRN

## 2019-03-15 MED ORDER — HEPARIN SODIUM (PORCINE) 1000 UNIT/ML IJ SOLN
INTRAMUSCULAR | Status: AC
  Filled 2019-03-15: qty 1

## 2019-03-15 MED ORDER — SODIUM CHLORIDE 0.9% FLUSH: 3.0000 mL | Freq: Two times a day (BID) | INTRAVENOUS | Status: DC

## 2019-03-15 MED ORDER — LIDOCAINE HCL (PF) 1 % IJ SOLN
INTRAMUSCULAR | Status: DC | PRN
  Administered 2019-03-15: 2 mL via INTRADERMAL

## 2019-03-15 MED ORDER — IODIXANOL 320 MG/ML IV SOLN
INTRAVENOUS | Status: DC | PRN
  Administered 2019-03-15: 12:00:00 50 mL via INTRAVENOUS

## 2019-03-15 MED ORDER — HEPARIN (PORCINE) IN NACL 1000-0.9 UT/500ML-% IV SOLN
INTRAVENOUS | Status: AC
  Filled 2019-03-15: qty 500

## 2019-03-15 MED ORDER — LIDOCAINE HCL (PF) 1 % IJ SOLN
INTRAMUSCULAR | Status: AC
  Filled 2019-03-15: qty 30

## 2019-03-15 MED ORDER — HEPARIN SODIUM (PORCINE) 1000 UNIT/ML IJ SOLN
INTRAMUSCULAR | Status: DC | PRN
  Administered 2019-03-15: 3000 [IU] via INTRAVENOUS

## 2019-03-15 MED ORDER — HEPARIN (PORCINE) IN NACL 1000-0.9 UT/500ML-% IV SOLN
INTRAVENOUS | Status: DC | PRN
  Administered 2019-03-15: 500 mL

## 2019-03-15 SURGICAL SUPPLY — 15 items
BAG SNAP BAND KOVER 36X36 (MISCELLANEOUS) ×2 IMPLANT
BALLN MUSTANG 6.0X100X75 (BALLOONS) ×2
BALLOON MUSTANG 6.0X100X75 (BALLOONS) ×1 IMPLANT
COVER DOME SNAP 22 D (MISCELLANEOUS) ×2 IMPLANT
KIT ENCORE 26 ADVANTAGE (KITS) ×2 IMPLANT
KIT MICROPUNCTURE NIT STIFF (SHEATH) ×2 IMPLANT
PROTECTION STATION PRESSURIZED (MISCELLANEOUS) ×2
SHEATH PINNACLE R/O II 6F 4CM (SHEATH) ×2 IMPLANT
SHEATH PROBE COVER 6X72 (BAG) ×2 IMPLANT
STATION PROTECTION PRESSURIZED (MISCELLANEOUS) ×1 IMPLANT
STOPCOCK MORSE 400PSI 3WAY (MISCELLANEOUS) ×2 IMPLANT
TRAY PV CATH (CUSTOM PROCEDURE TRAY) ×2 IMPLANT
TUBING CIL FLEX 10 FLL-RA (TUBING) ×2 IMPLANT
WIRE BENTSON .035X145CM (WIRE) ×2 IMPLANT
WIRE ROSEN-J .035X180CM (WIRE) ×2 IMPLANT

## 2019-03-15 NOTE — Interval H&P Note (Signed)
History and Physical Interval Note:  03/15/2019 10:30 AM  [place]  has presented today for surgery, with the diagnosis of Bleeding from fistula.  The various methods of treatment have been discussed with the patient and family. After consideration of risks, benefits and other options for treatment, the patient has consented to  Procedure(s): A/V FISTULAGRAM (Right) as a surgical intervention.  The patient's history has been reviewed, patient examined, no change in status, stable for surgery.  I have reviewed the patient's chart and labs.  Questions were answered to the patient's satisfaction.     [name]

## 2019-03-15 NOTE — Op Note (Signed)
° °  PATIENT: [name]      MRN: [id number] DOB: [birth year]    DATE OF PROCEDURE: 03/15/2019  INDICATIONS:    [name] is a 85 y.o. female who dialyzes on Tuesdays Thursdays and Saturdays.  She had presented with bleeding from her graft recently and required suture repair of this.  She was set up for a fistulogram to rule out an outflow obstruction.  PROCEDURE:    1.  Ultrasound-guided access to the right forearm AV graft 2.  Angioplasty of the superior vena cava 3.  Angioplasty of venous anastomosis and outflow brachial vein 4.  Fistulogram  SURGEON: Judeth Cornfield. Scot Dock, MD, FACS  ANESTHESIA: Local  EBL: Minimal  TECHNIQUE: The patient was taken to the peripheral vascular lab.  The right arm was prepped and draped in usual sterile fashion.  I cannulated the venous, lateral aspect of the right forearm AV graft with a micropuncture needle under ultrasound guidance.  A micropuncture sheath was introduced over a wire.  The picture of the cannulation with the ultrasound was sent to the server.  I then obtained a fistulogram to evaluate from the point of cannulation to include the central veins.  There was a stenosis in the superior vena cava and also some stenosis at the venous anastomosis and outflow vein.  The micropuncture sheath was exchanged for a short 6 [place] sheath over a Bentson wire.  I then used a 6 mm x 100 mm balloon for support and was able to advance the wire through the stenosis in the superior vena cava.  I then advanced the balloon over the wire and then exchanged for a Rosen wire.  The balloon was positioned across the stenosis in the superior vena cava and this was inflated to 24 atm.  There was no residual waist.  I then brought the balloon down to the area of stenosis at the venous anastomosis and the outflow vein and this was inflated to 24 atm.  There was a stent proximal to this which was patent.  With the balloon inflated I did do a retrograde shot to  evaluate the rest of the graft and the arterial anastomosis which was patent.  At the completion the balloon and wire were removed.  The cannulation site was closed with a 4-0 Monocryl.  The patient tolerated the procedure well.   FINDINGS:   1.  The right forearm AV graft is widely patent. 2.  There was a moderate stenosis in the superior vena cava which was addressed with a 6 mm balloon.  She was having some shortness of breath and given her age of 49 I did not balloon this more aggressively. 3.  The venous outflow vein was ballooned with a 6 mm balloon.  There was a good result with minimal residual stenosis. 4.  The graft is patent   CLINICAL NOTE: The graft is ready to be used.  [name], MD, FACS [facility]  DATE OF DICTATION:   03/15/2019

## 2019-03-15 NOTE — Discharge Instructions (Signed)

## 2019-03-16 ENCOUNTER — Encounter: Payer: Self-pay | Admitting: Cardiology

## 2019-03-16 DIAGNOSIS — I4510 Unspecified right bundle-branch block: Secondary | ICD-10-CM | POA: Insufficient documentation

## 2019-03-16 DIAGNOSIS — I272 Pulmonary hypertension, unspecified: Secondary | ICD-10-CM | POA: Insufficient documentation

## 2019-03-16 DIAGNOSIS — I350 Nonrheumatic aortic (valve) stenosis: Secondary | ICD-10-CM | POA: Insufficient documentation

## 2019-03-16 NOTE — Progress Notes (Signed)
Cardiology Office Note   Date:  03/18/2019   ID:  ANDREAH GOHEEN, DOB 1921/08/25, MRN [id number]  PCP:  Levin Erp, MD  Cardiologist:   Minus Breeding, MD   Chief Complaint  Patient presents with  . Fatigue     History of Present Illness: [name] is a 85 y.o. female who presents for evaluation of aortic stenosis and atrial fib.  Since I last saw her she has had progressive physical weakness.  In fact she was in the emergency room in October and I reviewed these records.  There was no clear underlying etiology for the fact that she has progressed from a walker to the wheelchair.  She is not had any acute event other than Dr. Nyoka Cowden thinking that maybe she has had a slight stroke with some right greater than left hand weakness.  She is still doing some stretching band exercises.  She is not having any new shortness of breath PND or orthopnea.  Is not having any new palpitations, presyncope or syncope.  Has had no weight gain or edema.  She tolerates dialysis.   Past Medical History:  Diagnosis Date  . Anemia   . Aortic stenosis   . Benign bladder mass   . Chronic cough   . DJD (degenerative joint disease)   . Dyslipidemia   . ESRD on hemodialysis ([place])    "TTS; [place]" (09/27/2016)  . Gout   . HTN (hypertension)   . Obstructive sleep apnea    mild  . Orthostatic syncope    around year 2006  . PAF (paroxysmal atrial fibrillation) ([place])   . Pneumonia   . Stroke ([place])   . Stroke [facility])    patient suffred stroke on the right side   . TIA (transient ischemic attack)     Past Surgical History:  Procedure Laterality Date  . AV FISTULA PLACEMENT Right 02/28/2019   Procedure: REPAIR  FOREARM ARTERIOVENOUS (AV) GORE-TEX GRAFT FOR BLEEDING;  Surgeon: Angelia Mould, MD;  Location: [facility];  Service: Vascular;  Laterality: Right;  . BONE MARROW BIOPSY    . CATARACT EXTRACTION W/ INTRAOCULAR LENS  IMPLANT, BILATERAL    . COLONOSCOPY W/ BIOPSIES AND  POLYPECTOMY    . left arm dialysis graft    . NEPHRECTOMY     left  . PERIPHERAL VASCULAR BALLOON ANGIOPLASTY Right 03/15/2019   Procedure: PERIPHERAL VASCULAR BALLOON ANGIOPLASTY;  Surgeon: Angelia Mould, MD;  Location: [facility];  Service: Cardiovascular;  Laterality: Right;  Arm fistula  . REVISION OF ARTERIOVENOUS GORETEX GRAFT Right 11/24/2014   Procedure: REPLACEMENT OF  MEDIAL SIDE OF RIGHT FORARM ARTERIOVENOUS GORETEX GRAFT;  Surgeon: Conrad Durant, MD;  Location: [facility];  Service: Vascular;  Laterality: Right;  . REVISION OF ARTERIOVENOUS GORETEX GRAFT Right 11/28/2015   Procedure: REVISION OF RIGHT ARM ARTERIOVENOUS GORETEX GRAFT;  Surgeon: Serafina Mitchell, MD;  Location: [facility];  Service: Vascular;  Laterality: Right;  . THROMBECTOMY AND REVISION OF ARTERIOVENTOUS (AV) GORETEX  GRAFT Right 02/28/2019   Procedure: Thrombectomy And Revision Of Arterioventous (Av) Goretex  Graft;  Surgeon: Angelia Mould, MD;  Location: [facility];  Service: Vascular;  Laterality: Right;  . TOTAL ABDOMINAL HYSTERECTOMY       Current Outpatient Medications  Medication Sig Dispense Refill  . acetaminophen (TYLENOL) 500 MG tablet Take 500-1,000 mg by mouth every 6 (six) hours as needed (for pain.).     Marland Kitchen amiodarone (PACERONE) 200 MG tablet  Take 0.5 tablets (100 mg total) by mouth at bedtime. 45 tablet 3  . Nutritional Supplements (FEEDING SUPPLEMENT, NEPRO CARB STEADY,) LIQD Take 237 mLs by mouth 2 (two) times daily between meals. (Patient taking differently: Take 237 mLs by mouth daily. ) 60 Can 0  . SENSIPAR 30 MG tablet Take 30 mg by mouth at bedtime.     Marland Kitchen warfarin (COUMADIN) 2.5 MG tablet TAKE 1 TO 1&1/2 TABLETS DAILY AS DIRECTED BY COUMADIN CLINIC. (Patient taking differently: Take 2.5-3.75 mg by mouth See admin instructions. Take 1.5 tablets (3.75 mg) by mouth on Mondays, then take 1 tablet (2.5 mg) by mouth on all other days.) 135 tablet 0   No current facility-administered  medications for this visit.     Allergies:   Tape and Sulfa antibiotics    ROS:  Please see the history of present illness.   Otherwise, review of systems are positive for none.   All other systems are reviewed and negative.    PHYSICAL EXAM: VS:  BP (!) 129/59   Pulse 66   Temp 98.2 F (36.8 C)   Ht _0  (1.626 m)   Wt 121 lb 4.1 oz (55 kg)   SpO2 99%   BMI 20.81 kg/m  , BMI Body mass index is 20.81 kg/m.  GEN:  Frail  NECK:  No jugular venous distention at 90 degrees, waveform within normal limits, carotid upstroke brisk and symmetric, no bruits, no thyromegaly LYMPHATICS:  No cervical adenopathy LUNGS:  Clear to auscultation bilaterally BACK:  No CVA tenderness CHEST:  Unremarkable HEART:  S1 and S2 within normal limits, no S3, no S4, no clicks, no rubs, 3 out of 6 apical mid-to-late peaking systolic murmur radiating up the aortic outflow tract, no diastolic murmurs ABD:  Positive bowel sounds normal in frequency in pitch, no bruits, no rebound, no guarding, unable to assess midline mass or bruit with the patient seated. EXT:  2 plus pulses throughout, mild lower extremity edema, no cyanosis no clubbing SKIN:  No rashes no nodules NEURO:  Cranial nerves II through XII grossly intact, motor grossly intact throughout PSYCH:  Cognitively intact, oriented to person place and time  EKG:  EKG is not ordered today.   Recent Labs: 02/28/2019: ALT 28; Platelets 145 03/15/2019: BUN 22; Creatinine, Ser 4.00; Hemoglobin 10.2; Potassium 3.8; Sodium 134   Lab Results  Component Value Date   TSH 2.400 08/20/2016   ALT 28 02/28/2019   AST 31 02/28/2019   ALKPHOS 103 02/28/2019   BILITOT 0.4 02/28/2019   PROT 6.8 02/28/2019   ALBUMIN 4.0 02/28/2019    Lipid Panel    Component Value Date/Time   CHOL 143 08/21/2016 0429   TRIG 62 08/21/2016 0429   HDL 68 08/21/2016 0429   CHOLHDL 2.1 08/21/2016 0429   VLDL 12 08/21/2016 0429   LDLCALC 63 08/21/2016 0429     Lab Results   Component Value Date   TSH 2.400 08/20/2016   ALT 28 02/28/2019   AST 31 02/28/2019   ALKPHOS 103 02/28/2019   BILITOT 0.4 02/28/2019   PROT 6.8 02/28/2019   ALBUMIN 4.0 02/28/2019     Wt Readings from Last 3 Encounters:  03/18/19 121 lb 4.1 oz (55 kg)  03/15/19 123 lb 7.3 oz (56 kg)  09/15/17 131 lb (59.4 kg)      Other studies Reviewed: Additional studies/ records that were reviewed today include:  ED records Review of the above records demonstrates:   See above  ASSESSMENT AND PLAN:  AS:   The patient has severe aortic stenosis.  However, her volume is managed well with dialysis.  No change in therapy.  No indication for any aggressive therapy and she would not be interested in TAVR.   ATRIAL FIB:   Ms. ROXANA LAI has a CHA2DS2 - VASc score of 6.  Amiodarone seems to maintain sinus rhythm.  She tolerates anticoagulation.   I will check a CBC, TSH and Bumex today.  PULMONARY HTN:      I will follow this symptomatically.  No change in therapy.  RBBB:   She does have bradycardia.  Metoprolol was stopped previously.   HTN:   She does have some low blood pressures occasionally gets lightheaded.  She lies puts her feet up.  This seems to take care of it.  I do not see indication for midodrine which she was on previously.     Current medicines are reviewed at length with the patient today.  The patient does not have concerns regarding medicines.  The following changes have been made:  None  Labs/ tests ordered today include:  See above  Orders Placed This Encounter  Procedures  . Comprehensive Metabolic Panel (CMET)  . CBC  . TSH     Disposition:   FU with me in 12 months.     Signed, Minus Breeding, MD  03/18/2019 11:46 AM    [place]

## 2019-03-18 ENCOUNTER — Other Ambulatory Visit (INDEPENDENT_AMBULATORY_CARE_PROVIDER_SITE_OTHER): Payer: Medicare Other

## 2019-03-18 ENCOUNTER — Ambulatory Visit (INDEPENDENT_AMBULATORY_CARE_PROVIDER_SITE_OTHER): Payer: Medicare Other | Admitting: Cardiology

## 2019-03-18 ENCOUNTER — Encounter (HOSPITAL_COMMUNITY): Payer: Self-pay | Admitting: Vascular Surgery

## 2019-03-18 ENCOUNTER — Other Ambulatory Visit: Payer: Self-pay

## 2019-03-18 ENCOUNTER — Ambulatory Visit (INDEPENDENT_AMBULATORY_CARE_PROVIDER_SITE_OTHER): Payer: Medicare Other | Admitting: Pharmacist

## 2019-03-18 VITALS — BP 129/59 | HR 66 | Temp 98.2°F | Ht 64.0 in | Wt 121.3 lb

## 2019-03-18 DIAGNOSIS — I4891 Unspecified atrial fibrillation: Secondary | ICD-10-CM

## 2019-03-18 DIAGNOSIS — I4510 Unspecified right bundle-branch block: Secondary | ICD-10-CM | POA: Diagnosis not present

## 2019-03-18 DIAGNOSIS — I350 Nonrheumatic aortic (valve) stenosis: Secondary | ICD-10-CM

## 2019-03-18 DIAGNOSIS — I10 Essential (primary) hypertension: Secondary | ICD-10-CM

## 2019-03-18 DIAGNOSIS — Z5181 Encounter for therapeutic drug level monitoring: Secondary | ICD-10-CM | POA: Diagnosis not present

## 2019-03-18 DIAGNOSIS — I272 Pulmonary hypertension, unspecified: Secondary | ICD-10-CM | POA: Diagnosis not present

## 2019-03-18 DIAGNOSIS — I480 Paroxysmal atrial fibrillation: Secondary | ICD-10-CM

## 2019-03-18 DIAGNOSIS — Z7901 Long term (current) use of anticoagulants: Secondary | ICD-10-CM

## 2019-03-18 LAB — POCT INR: INR: 1.3 — AB (ref 2.0–3.0)

## 2019-03-18 MED ORDER — AMIODARONE HCL 200 MG PO TABS: 100.0000 mg | ORAL_TABLET | Freq: Every day | ORAL | 3 refills | Status: DC

## 2019-03-18 MED ORDER — WARFARIN SODIUM 2.5 MG PO TABS: ORAL_TABLET | ORAL | 0 refills | Status: DC

## 2019-03-18 NOTE — Patient Instructions (Signed)
Medication Instructions:  NO CHANGE *If you need a refill on your cardiac medications before your next appointment, please call your pharmacy*  Lab Work: Your physician recommends that you HAVE LAB WORK TODAY  If you have labs (blood work) drawn today and your tests are completely normal, you will receive your results only by: Marland Kitchen MyChart Message (if you have MyChart) OR . A paper copy in the mail If you have any lab test that is abnormal or we need to change your treatment, we will call you to review the results.  Follow-Up: At [facility], you and your health needs are our priority.  As part of our continuing mission to provide you with exceptional heart care, we have created designated Provider Care Teams.  These Care Teams include your primary Cardiologist (physician) and Advanced Practice Providers (APPs -  Physician Assistants and Nurse Practitioners) who all work together to provide you with the care you need, when you need it.  Your next appointment:   12 months  The format for your next appointment:   In Person  Provider:   Minus Breeding, MD

## 2019-03-25 ENCOUNTER — Encounter: Payer: Self-pay | Admitting: *Deleted

## 2019-03-26 LAB — POCT INR: INR: 2.7 (ref 2.0–3.0)

## 2019-03-28 ENCOUNTER — Encounter: Payer: Self-pay | Admitting: Pharmacist Clinician (PhC)/ Clinical Pharmacy Specialist

## 2019-03-28 ENCOUNTER — Ambulatory Visit (INDEPENDENT_AMBULATORY_CARE_PROVIDER_SITE_OTHER): Payer: Medicare Other | Admitting: Cardiology

## 2019-03-28 DIAGNOSIS — Z7901 Long term (current) use of anticoagulants: Secondary | ICD-10-CM

## 2019-03-28 DIAGNOSIS — I4891 Unspecified atrial fibrillation: Secondary | ICD-10-CM | POA: Diagnosis not present

## 2019-03-28 DIAGNOSIS — Z5181 Encounter for therapeutic drug level monitoring: Secondary | ICD-10-CM

## 2019-04-11 LAB — PROTIME-INR

## 2019-04-24 LAB — PROTIME-INR: INR: 3.1 — AB (ref 0.9–1.1)

## 2019-04-25 ENCOUNTER — Ambulatory Visit (INDEPENDENT_AMBULATORY_CARE_PROVIDER_SITE_OTHER): Payer: Medicare Other | Admitting: Pharmacist

## 2019-04-25 DIAGNOSIS — I4891 Unspecified atrial fibrillation: Secondary | ICD-10-CM

## 2019-05-07 LAB — POCT INR: INR: 2.6 (ref 2.0–3.0)

## 2019-05-12 IMAGING — CT CT HEAD W/O CM
3 series · 15 of 47 positions shown, 18 images · non-contrast
Comparison: None.

CLINICAL DATA: Syncopal episode last night. Denies hitting head.
Next medial 10/06/2010

EXAM:
CT HEAD WITHOUT CONTRAST
TECHNIQUE: Contiguous axial images were obtained from the base of the skull
through the vertex without intravenous contrast.

[Series 3: head 5.0 h30s · axial · 0.41mm/px · z∈[-79,+46]mm · 9 of 31 slices shown, 12 images]
[im 3/31  brain]
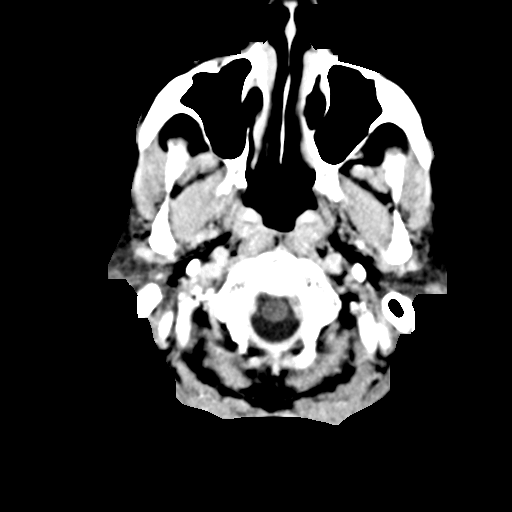
[im 3/31  bone]
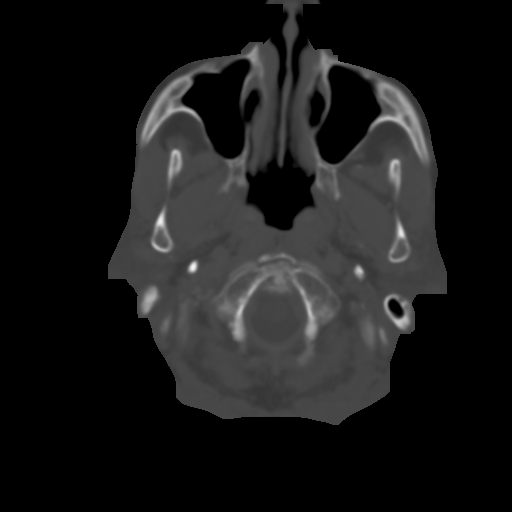
[im 6/31  brain]
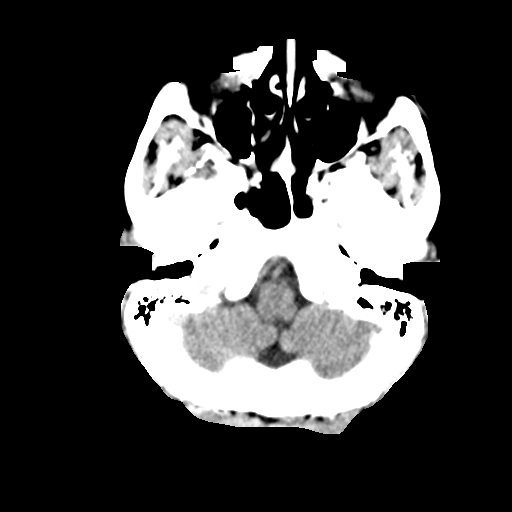
[im 9/31  brain]
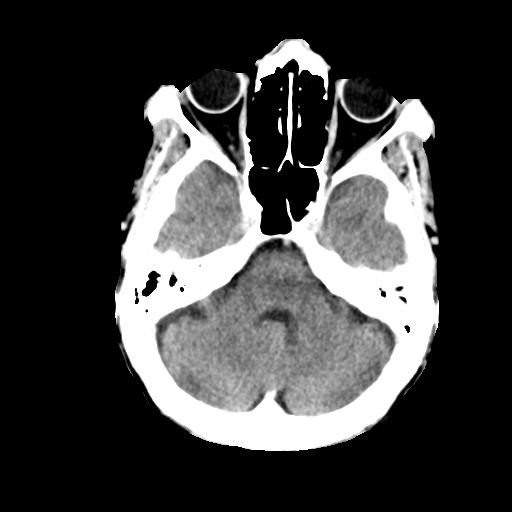
[im 12/31  brain]
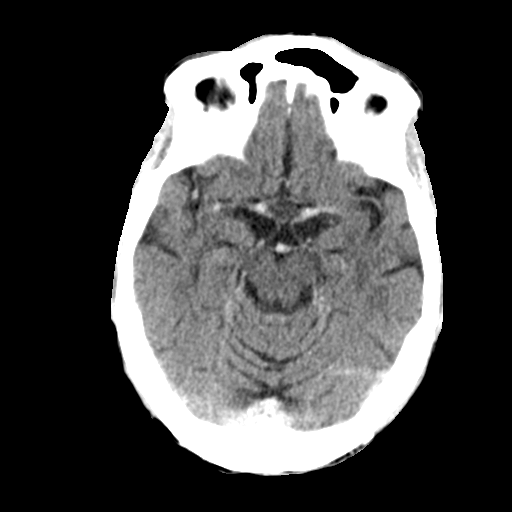
[im 16/31  brain]
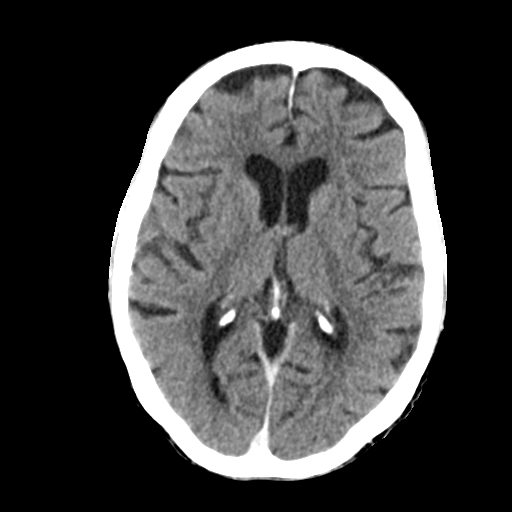
[im 16/31  bone]
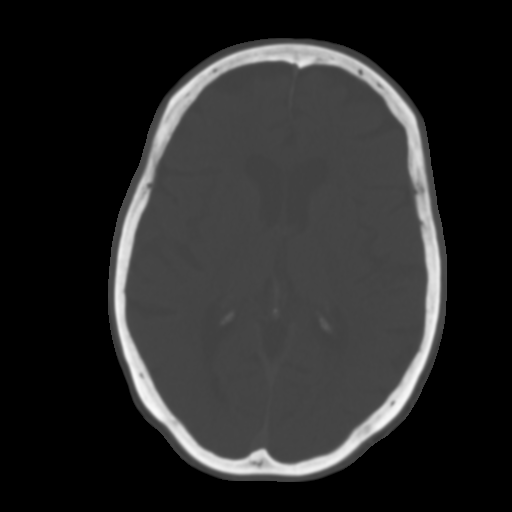
[im 19/31  brain]
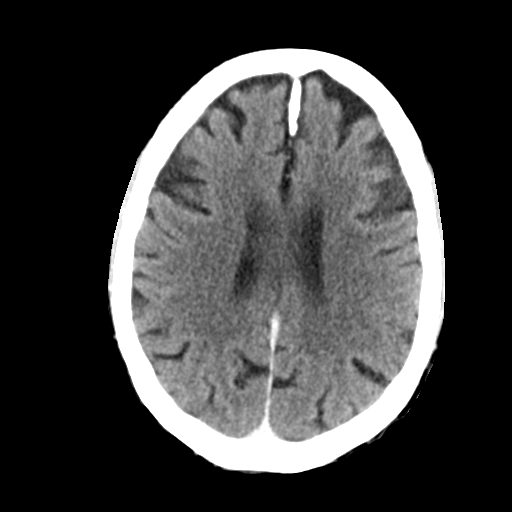
[im 22/31  brain]
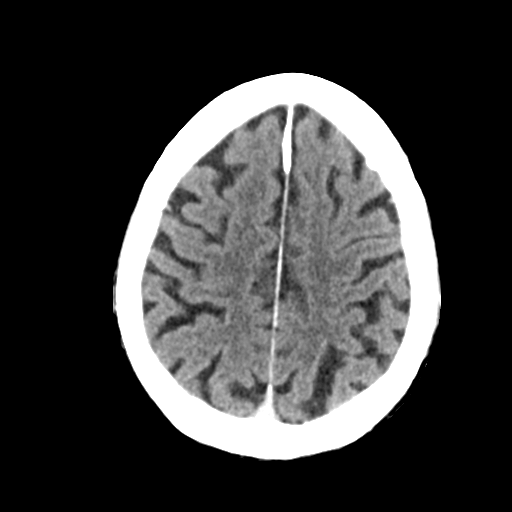
[im 25/31  brain]
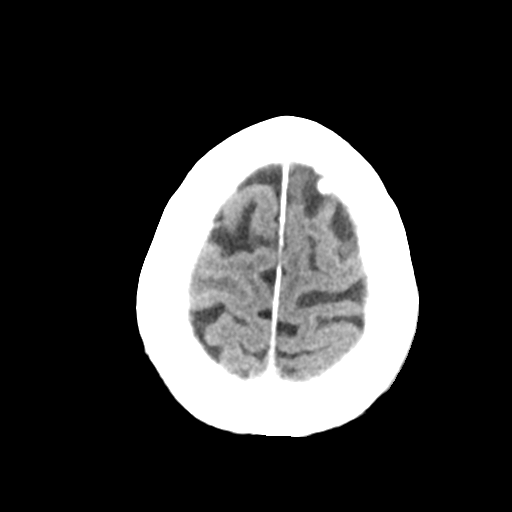
[im 28/31  brain]
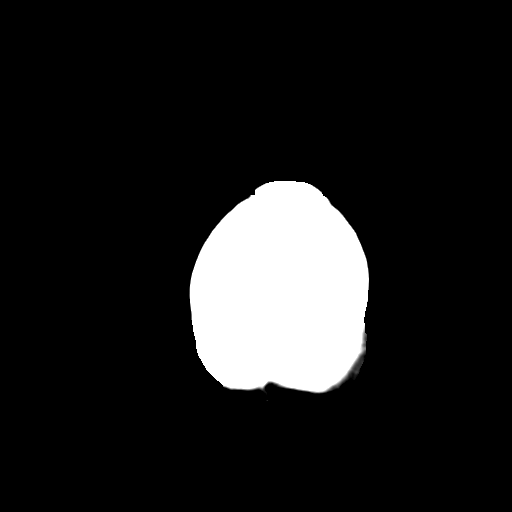
[im 28/31  bone]
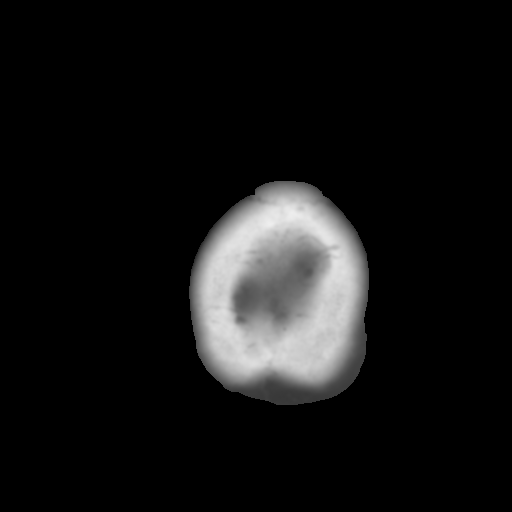

[Series 5: head 3.0 mpr cor · coronal · 0.30mm/px · 3 of 67 slices shown]
[im 23/67  brain]
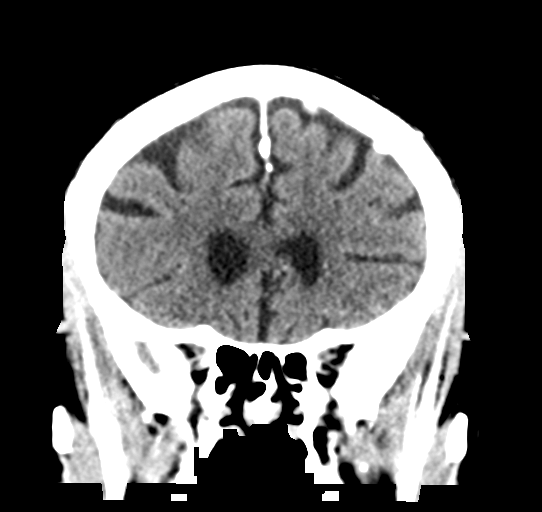
[im 30/67  brain]
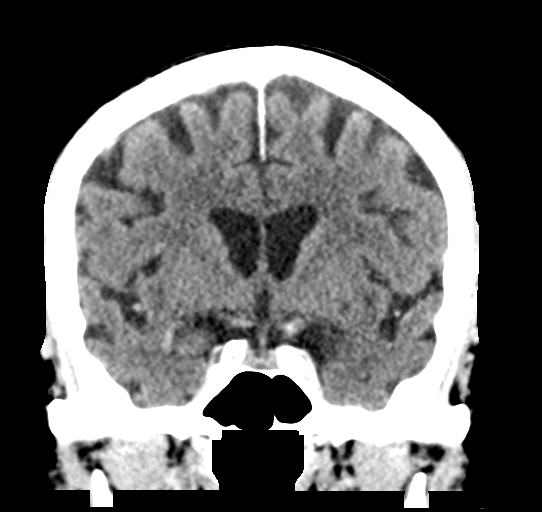
[im 37/67  brain]
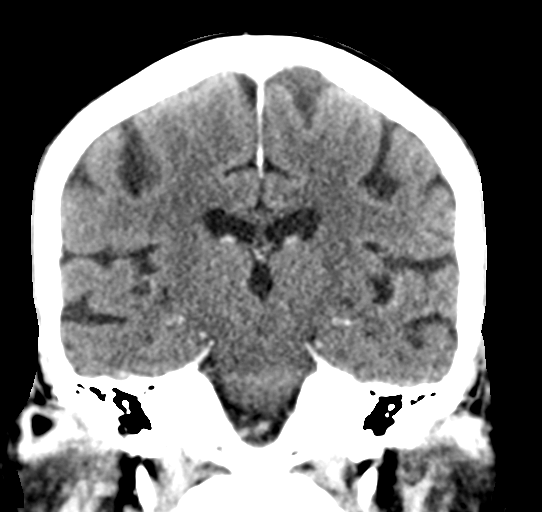

[Series 6: head 3.0 mpr sag · sagittal · 0.30mm/px · 3 of 53 slices shown]
[im 18/53  brain]
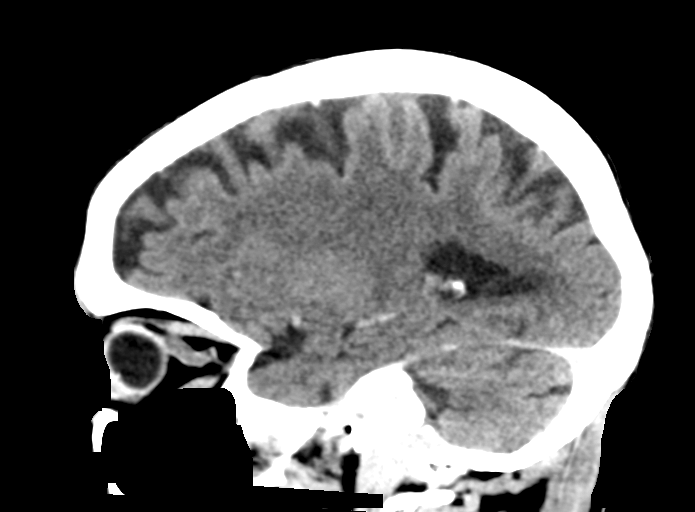
[im 27/53  brain]
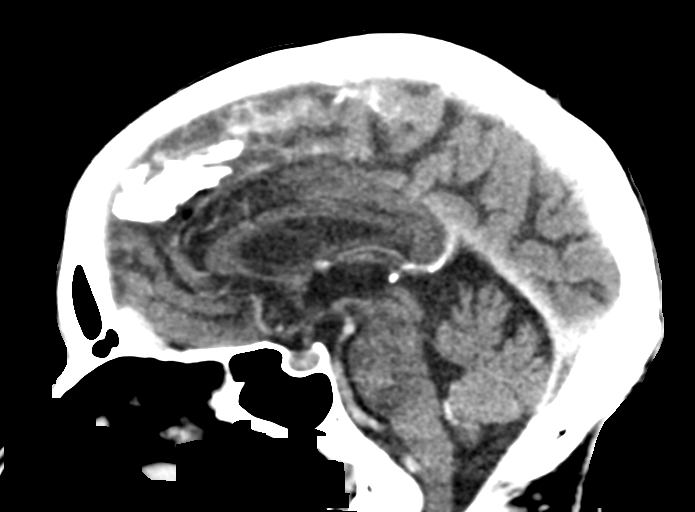
[im 35/53  brain]
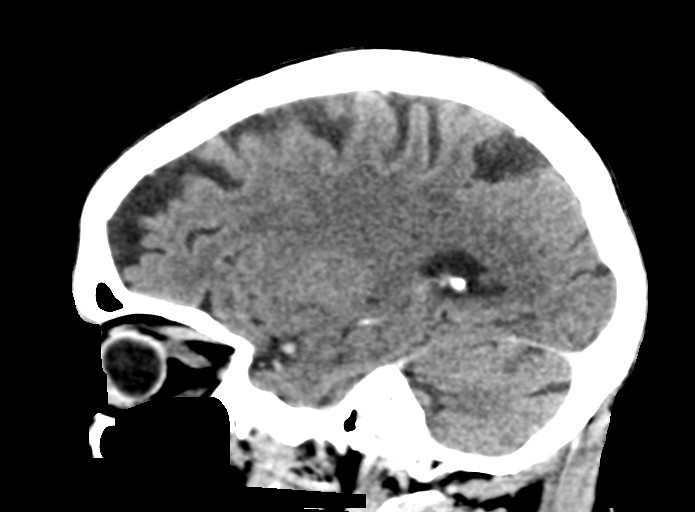

[15 of 47 positions shown; findings below may reference images not displayed]

FINDINGS: Brain: New new Generalized cerebral atrophy. Periventricular white
matter low attenuation likely secondary to microangiopathy.

Vascular: Cerebrovascular atherosclerotic calcifications are noted.

Skull: Negative for fracture or focal lesion.

Sinuses/Orbits: Visualized portions of the orbits are unremarkable.
Visualized portions of the paranasal sinuses and mastoid air cells
are unremarkable.

Other: None.
IMPRESSION: 1. No acute intracranial pathology.
2. Chronic microvascular disease and cerebral atrophy.

## 2019-05-13 ENCOUNTER — Ambulatory Visit (INDEPENDENT_AMBULATORY_CARE_PROVIDER_SITE_OTHER): Payer: Medicare Other | Admitting: Cardiology

## 2019-05-13 DIAGNOSIS — I4891 Unspecified atrial fibrillation: Secondary | ICD-10-CM | POA: Diagnosis not present

## 2019-05-13 DIAGNOSIS — Z5181 Encounter for therapeutic drug level monitoring: Secondary | ICD-10-CM | POA: Diagnosis not present

## 2019-05-13 DIAGNOSIS — Z7901 Long term (current) use of anticoagulants: Secondary | ICD-10-CM

## 2019-05-20 ENCOUNTER — Encounter: Payer: Self-pay | Admitting: Internal Medicine

## 2019-05-20 ENCOUNTER — Ambulatory Visit (INDEPENDENT_AMBULATORY_CARE_PROVIDER_SITE_OTHER): Payer: Medicare Other | Admitting: Internal Medicine

## 2019-05-20 ENCOUNTER — Other Ambulatory Visit: Payer: Medicare Other

## 2019-05-20 ENCOUNTER — Other Ambulatory Visit: Payer: Self-pay

## 2019-05-20 VITALS — BP 129/62 | HR 82 | Temp 97.7°F | Ht 64.0 in | Wt 125.0 lb

## 2019-05-20 DIAGNOSIS — N2581 Secondary hyperparathyroidism of renal origin: Secondary | ICD-10-CM

## 2019-05-20 DIAGNOSIS — Z992 Dependence on renal dialysis: Secondary | ICD-10-CM | POA: Diagnosis not present

## 2019-05-20 DIAGNOSIS — I480 Paroxysmal atrial fibrillation: Secondary | ICD-10-CM | POA: Diagnosis not present

## 2019-05-20 DIAGNOSIS — I770 Arteriovenous fistula, acquired: Secondary | ICD-10-CM

## 2019-05-20 DIAGNOSIS — N186 End stage renal disease: Secondary | ICD-10-CM

## 2019-05-20 DIAGNOSIS — G8111 Spastic hemiplegia affecting right dominant side: Secondary | ICD-10-CM

## 2019-05-20 DIAGNOSIS — I350 Nonrheumatic aortic (valve) stenosis: Secondary | ICD-10-CM

## 2019-05-20 DIAGNOSIS — E7849 Other hyperlipidemia: Secondary | ICD-10-CM

## 2019-05-20 NOTE — Progress Notes (Signed)
Provider:  Dr. Hollace Kinnier Location:   [place] of Service:   [facility]  PCP: Levin Erp, MD Patient Care Team: Levin Erp, MD as PCP - General (Internal Medicine) Minus Breeding, MD as PCP - Cardiology (Cardiology) Donato Heinz, MD as Referring Physician (Nephrology) Latanya Maudlin, MD as Consulting Physician (Orthopedic Surgery) Inocencio Homes, DPM as Consulting Physician (Podiatry) Clent Jacks, MD as Consulting Physician (Ophthalmology) [facility] (Nephrology)  Extended Emergency Contact Information Primary Emergency Contact: [facility] Address: [address]          [place], [place] [address] Johnnette Litter of [place] Phone: [phone] Mobile Phone: [phone] Relation: Son Secondary Emergency Contact: Metoyer,Catherine  [place] Phone: [phone] Relation: Daughter   Goals of Care: Advanced Directive information Advanced Directives 05/20/2019  Does Patient Have a Medical Advance Directive? No  Type of Advance Directive -  Does patient want to make changes to medical advance directive? -  Would patient like information on creating a medical advance directive? Yes (Inpatient - patient requests chaplain consult to create a medical advance directive)  Pre-existing out of facility DNR order (yellow form or pink MOST form) -      Chief Complaint  Patient presents with  . Establish Care     New Patient to [facility] care, Barnetta Chapel daughter     HPI: Patient is a 86 y.o. female seen today to establish new care with [facility].   Daughter Barnetta Chapel present for encounter.   Dr. Levin Erp is retiring and she needed to find a new PCP.   She has been on dialysis for 14 years. She attends three times a week. Labs are done through the dialysis center.   She sees the [facility] clinic and has her coumadin checked monthly. Labs are drawn at dialysis center.  She takes her  warfarin daily. Limits foods high in vitamin K.   A year ago she was driving and ambulating with a walker. Daughter states her health has declined since November 2020. At first she started walking with a limp, because her knee was buckling.  By the time she saw her PCP, she had worsening right sided weakness. PCP thinks she had a mild stroke. Since right sided weakness began, she has been in a wheelchair.  Her recent change in mobility makes her sad at times. She misses her independence. Since November, she has been seeing PT once a week. Also completed 4 weeks of OT. PT ended last week. She still has right side weakness. Admits to not performing exercises when at home due to dialysis three times a week and feeling tired.    Eats 2 meals daily. Has about 40g protein daily. Follows a renal diet that restricts sodium, fluids and potassium.  Average weight is 54 kg. Drinks about 16oz daily.   Lives at home with daughter. Home is one level. Wheelchair fits throughout house and bathroom.       Past Medical History:  Diagnosis Date  . Anemia   . Aortic stenosis   . Atrial fibrillation ([place])   . Benign bladder mass   . Chronic cough   . CKD (chronic kidney disease), stage V ([place])    14 years dialysis   . DJD (degenerative joint disease)   . Dyslipidemia   . ESRD on hemodialysis ([place])    "TTS; [place]" (09/27/2016)  . Gout   . HTN (hypertension)   . Obstructive sleep apnea    mild  .  Orthostatic syncope    around year 2006  . PAF (paroxysmal atrial fibrillation) ([place])   . Pneumonia   . Stroke ([place])   . Stroke [facility])    patient suffred stroke on the right side   . TIA (transient ischemic attack)    Past Surgical History:  Procedure Laterality Date  . AV FISTULA PLACEMENT Right 02/28/2019   Procedure: REPAIR  FOREARM ARTERIOVENOUS (AV) GORE-TEX GRAFT FOR BLEEDING;  Surgeon: Angelia Mould, MD;  Location: [place];  Service: Vascular;  Laterality: Right;  . BONE MARROW BIOPSY     . CATARACT EXTRACTION W/ INTRAOCULAR LENS  IMPLANT, BILATERAL    . COLONOSCOPY W/ BIOPSIES AND POLYPECTOMY    . left arm dialysis graft    . NEPHRECTOMY     left  . PERIPHERAL VASCULAR BALLOON ANGIOPLASTY Right 03/15/2019   Procedure: PERIPHERAL VASCULAR BALLOON ANGIOPLASTY;  Surgeon: Angelia Mould, MD;  Location: [facility];  Service: Cardiovascular;  Laterality: Right;  Arm fistula  . REVISION OF ARTERIOVENOUS GORETEX GRAFT Right 11/24/2014   Procedure: REPLACEMENT OF  MEDIAL SIDE OF RIGHT FORARM ARTERIOVENOUS GORETEX GRAFT;  Surgeon: Conrad Murray, MD;  Location: [place];  Service: Vascular;  Laterality: Right;  . REVISION OF ARTERIOVENOUS GORETEX GRAFT Right 11/28/2015   Procedure: REVISION OF RIGHT ARM ARTERIOVENOUS GORETEX GRAFT;  Surgeon: Serafina Mitchell, MD;  Location: [place];  Service: Vascular;  Laterality: Right;  . THROMBECTOMY AND REVISION OF ARTERIOVENTOUS (AV) GORETEX  GRAFT Right 02/28/2019   Procedure: Thrombectomy And Revision Of Arterioventous (Av) Goretex  Graft;  Surgeon: Angelia Mould, MD;  Location: [place];  Service: Vascular;  Laterality: Right;  . TOTAL ABDOMINAL HYSTERECTOMY      reports that she has never smoked. She has never used smokeless tobacco. She reports that she does not drink alcohol or use drugs. Social History   Socioeconomic History  . Marital status: Widowed    Spouse name: Not on file  . Number of children: Not on file  . Years of education: 61  . Highest education level: High school graduate  Occupational History  . Occupation: Retired    Comment: Housekeeper  Tobacco Use  . Smoking status: Never Smoker  . Smokeless tobacco: Never Used  Substance and Sexual Activity  . Alcohol use: No    Alcohol/week: 0.0 standard drinks  . Drug use: No  . Sexual activity: Never  Other Topics Concern  . Not on file  Social History Narrative   Social History      Diet? Renal potassium, sodium fluid (40 oz) limitations      Do you  drink/eat things with caffeine? coffee      Marital status?                  Divorced                   What year were you married?      Do you live in a house, apartment, assisted living, condo, trailer, etc.? house      Is it one or more stories? 1 story      How many persons live in your home? 1      Do you have any pets in your home? none      Highest level of education completed? High school       Current or past profession: housekeeper      Do you exercise?  no                           Type & how often?       Advanced Directives      Do you have a living will? no      Do you have a DNR form?            yes                      If not, do you want to discuss one?      Do you have signed POA/HPOA for forms? no      Functional Status      Do you have difficulty bathing or dressing yourself? yes      Do you have difficulty preparing food or eating? yes      Do you have difficulty managing your medications?no      Do you have difficulty managing your finances?no      Do you have difficulty affording your medications? no         Social Determinants of Health   Financial Resource Strain:   . Difficulty of Paying Living Expenses: Not on file  Food Insecurity:   . Worried About Charity fundraiser in the Last Year: Not on file  . Ran Out of Food in the Last Year: Not on file  Transportation Needs:   . Lack of Transportation (Medical): Not on file  . Lack of Transportation (Non-Medical): Not on file  Physical Activity:   . Days of Exercise per Week: Not on file  . Minutes of Exercise per Session: Not on file  Stress:   . Feeling of Stress : Not on file  Social Connections:   . Frequency of Communication with Friends and Family: Not on file  . Frequency of Social Gatherings with Friends and Family: Not on file  . Attends Religious Services: Not on file  . Active Member of Clubs or Organizations: Not on file  . Attends Archivist Meetings: Not  on file  . Marital Status: Not on file  Intimate Partner Violence:   . Fear of Current or Ex-Partner: Not on file  . Emotionally Abused: Not on file  . Physically Abused: Not on file  . Sexually Abused: Not on file    Functional Status Survey:    Family History  Problem Relation Age of Onset  . Hypertension Mother   . Diabetes Mother   . Hypertension Other   . Cancer Son   . Hypertension Son   . Kidney disease Son   . Cancer Daughter     Health Maintenance  Topic Date Due  . URINE MICROALBUMIN  08/09/1933  . TETANUS/TDAP  08/10/1942  . DEXA SCAN  08/09/1988  . PNA vac Low Risk Adult (2 of 2 - PCV13) 02/10/2009  . INFLUENZA VACCINE  12/08/2018    Allergies  Allergen Reactions  . Tape Other (See Comments)    Pulls off skin  . Sulfa Antibiotics Other (See Comments)    Bumps all over body    Outpatient Encounter Medications as of 05/20/2019  Medication Sig  . amiodarone (PACERONE) 200 MG tablet Take 0.5 tablets (100 mg total) by mouth at bedtime.  . cinacalcet (SENSIPAR) 60 MG tablet Take 60 mg by mouth daily.  . midodrine (PROAMATINE) 10 MG tablet Take 10 mg by mouth 3 (three) times daily. 30 mins before  dialysis  . warfarin (COUMADIN) 2.5 MG tablet Take 2.5 mg by mouth daily.  . [DISCONTINUED] chlorpheniramine (CHLOR-TRIMETON) 4 MG tablet One pill at night as needed for cough   No facility-administered encounter medications on file as of 05/20/2019.    Review of Systems  Constitutional: Positive for activity change. Negative for appetite change and fatigue.  HENT: Positive for dental problem. Negative for hearing loss and trouble swallowing.        Missing teeth  Eyes: Negative for photophobia and visual disturbance.       Wears glasses  Respiratory: Negative for cough, shortness of breath and wheezing.   Cardiovascular: Positive for leg swelling. Negative for chest pain.  Gastrointestinal: Negative for abdominal pain, constipation, diarrhea and nausea.    Endocrine: Negative for polydipsia, polyphagia and polyuria.  Genitourinary: Negative for dysuria, hematuria and vaginal bleeding.  Musculoskeletal:       Right sided weakness  Skin: Negative.   Neurological: Negative for dizziness, tremors and headaches.  Hematological: Bruises/bleeds easily.  Psychiatric/Behavioral: Positive for dysphoric mood. Negative for sleep disturbance. The patient is not nervous/anxious.     Vitals:   05/20/19 1327  BP: 129/62  Pulse: 82  Temp: 97.7 F (36.5 C)  SpO2: 97%  Weight: 125 lb (56.7 kg)  Height: 5' 4"  (1.626 m)   Body mass index is 21.46 kg/m. Physical Exam Vitals reviewed.  Constitutional:      Appearance: Normal appearance. She is normal weight.  HENT:     Head: Normocephalic.  Cardiovascular:     Rate and Rhythm: Normal rate. Rhythm irregular.     Pulses: Normal pulses.     Heart sounds: Normal heart sounds. No murmur.  Pulmonary:     Effort: Pulmonary effort is normal. No respiratory distress.     Breath sounds: Rales present. No wheezing.     Comments: LLQ rales Abdominal:     General: Abdomen is flat. Bowel sounds are normal.     Palpations: Abdomen is soft.  Musculoskeletal:     Right forearm: Edema present.     Right lower leg: 3+ Pitting Edema present.     Left lower leg: 3+ Pitting Edema present.     Comments: Right wrist strength 3/5. Left wrist strength 4/5. Right Dorsal flexion 2/5. Left dorsal flexion 4/5.   Skin:    General: Skin is warm and dry.     Capillary Refill: Capillary refill takes less than 2 seconds.     Comments: Scalp dry and flaking. AV fistuala to right forearm palpated.   Neurological:     Mental Status: She is alert and oriented to person, place, and time.     Sensory: Sensory deficit present.     Motor: Weakness present.     Coordination: Coordination abnormal.     Gait: Gait abnormal.  Psychiatric:        Mood and Affect: Mood normal.        Behavior: Behavior normal.        Thought  Content: Thought content normal.        Judgment: Judgment normal.     Labs reviewed: Basic Metabolic Panel: Recent Labs    02/26/19 0851 02/28/19 1559 03/15/19 1014  NA 137 138 134*  K 4.5 3.1* 3.8  CL 92* 94* 93*  CO2 29 29  --   GLUCOSE 95 100* 83  BUN 56* 9 22  CREATININE 6.90* 1.94* 4.00*  CALCIUM 8.8* 9.1  --    Liver Function Tests: Recent  Labs    02/28/19 1559  AST 31  ALT 28  ALKPHOS 103  BILITOT 0.4  PROT 6.8  ALBUMIN 4.0   No results for input(s): LIPASE, AMYLASE in the last 8760 hours. No results for input(s): AMMONIA in the last 8760 hours. CBC: Recent Labs    02/26/19 0851 02/28/19 1559 03/15/19 1014  WBC 4.0 5.7  --   NEUTROABS 2.2 3.8  --   HGB 11.1* 11.8* 10.2*  HCT 34.3* 36.2 30.0*  MCV 100.9* 100.0  --   PLT 117* 145*  --    Cardiac Enzymes: Recent Labs    02/26/19 0851  CKTOTAL 57   BNP: Invalid input(s): POCBNP Lab Results  Component Value Date   HGBA1C 4.8 08/21/2016   Lab Results  Component Value Date   TSH 2.400 08/20/2016   No results found for: VITAMINB12 No results found for: FOLATE No results found for: IRON, TIBC, FERRITIN  Imaging and Procedures obtained prior to SNF admission: No results found.  Assessment/Plan 1. ESRD on dialysis ([place]) - stable at this time - attends dialysis three times weekly - continue to follow renal diet  - avoid medications that are nephrotoxic like NSAIDS  2. Severe aortic stenosis - followed by cardiology - murmur heard during examination - bp at goal <150/90 - continue current warfarin therapy  3. PAF (paroxysmal atrial fibrillation)([facility]) - followed by cardiology - she has right sided weakness due to possible stroke since November 2020, she continues to be at high risk for stroke - continue current amiodarone regimen - continue warfarin therapy  4. A-V fistula ([place]) - followed by dialysis - stable at this time  5. Other hyperlipidemia -continue to limit foods high in  fat and fried foods   6. Secondary hyperparathyroidism ([place]) - due to ESRD and dialysis - labs and calcium levels followed by dialysis center  7. Right spastic hemiparesis ([place]) - family is asking about skilled nursing placement for more aggressive therapy, due to covid-19 I do not advise placing her in a SNF - recommend additional home health for PT/OT - Botox could be a possibility for muscle spacisity, will have to check with ESRD   Family/ staff Communication:   Labs/tests ordered: none Follow-up : 2 month follow up

## 2019-05-20 NOTE — Patient Instructions (Addendum)
Resume your PT exercises.  We'll try to get you a new home health referral.   Usually the next step is outpatient PT if you've maximized home health PT.  I'll check on botox for spasticity when you have end stage kidney disease  Sign up for the covid shot through [employer] at [facility].  Please complete your living will and health care power of attorney paperwork.

## 2019-05-21 NOTE — Addendum Note (Signed)
Addended by: Hollace Kinnier L on: 05/21/2019 11:44 AM   Modules accepted: Orders

## 2019-06-07 ENCOUNTER — Encounter: Payer: Medicare Other | Admitting: Physical Medicine & Rehabilitation

## 2019-06-13 LAB — PROTIME-INR: INR: 4.4 — AB (ref 0.9–1.1)

## 2019-06-18 ENCOUNTER — Ambulatory Visit (INDEPENDENT_AMBULATORY_CARE_PROVIDER_SITE_OTHER): Payer: Medicare Other | Admitting: Pharmacist

## 2019-06-18 DIAGNOSIS — Z5181 Encounter for therapeutic drug level monitoring: Secondary | ICD-10-CM

## 2019-06-18 DIAGNOSIS — I4891 Unspecified atrial fibrillation: Secondary | ICD-10-CM

## 2019-06-18 DIAGNOSIS — Z7901 Long term (current) use of anticoagulants: Secondary | ICD-10-CM | POA: Diagnosis not present

## 2019-07-11 LAB — POCT INR: INR: 2.7 (ref 2.0–3.0)

## 2019-07-13 LAB — PROTIME-INR: INR: 2.7 — AB (ref 0.9–1.1)

## 2019-07-15 ENCOUNTER — Ambulatory Visit (INDEPENDENT_AMBULATORY_CARE_PROVIDER_SITE_OTHER): Payer: Medicare Other | Admitting: Cardiology

## 2019-07-15 DIAGNOSIS — Z5181 Encounter for therapeutic drug level monitoring: Secondary | ICD-10-CM | POA: Diagnosis not present

## 2019-07-15 DIAGNOSIS — Z7901 Long term (current) use of anticoagulants: Secondary | ICD-10-CM

## 2019-07-15 DIAGNOSIS — I4891 Unspecified atrial fibrillation: Secondary | ICD-10-CM | POA: Diagnosis not present

## 2019-07-19 ENCOUNTER — Other Ambulatory Visit: Payer: Self-pay

## 2019-07-19 ENCOUNTER — Ambulatory Visit: Payer: Medicare Other | Admitting: Nurse Practitioner

## 2019-07-19 ENCOUNTER — Encounter: Payer: Medicare Other | Admitting: Internal Medicine

## 2019-07-19 ENCOUNTER — Encounter: Payer: Self-pay | Admitting: Internal Medicine

## 2019-07-19 NOTE — Progress Notes (Signed)
This encounter was created in error - please disregard.

## 2019-07-30 ENCOUNTER — Other Ambulatory Visit: Payer: Self-pay | Admitting: Cardiology

## 2019-08-23 ENCOUNTER — Ambulatory Visit: Payer: Medicare Other | Admitting: Neurology

## 2019-09-09 LAB — CBC AND DIFFERENTIAL: WBC: 4.2

## 2019-09-11 LAB — POCT INR: INR: 2.6 (ref 2.0–3.0)

## 2019-09-12 ENCOUNTER — Encounter: Payer: Self-pay | Admitting: Pharmacist Clinician (PhC)/ Clinical Pharmacy Specialist

## 2019-09-12 ENCOUNTER — Ambulatory Visit (INDEPENDENT_AMBULATORY_CARE_PROVIDER_SITE_OTHER): Payer: Medicare Other | Admitting: Cardiology

## 2019-09-12 DIAGNOSIS — Z5181 Encounter for therapeutic drug level monitoring: Secondary | ICD-10-CM | POA: Diagnosis not present

## 2019-09-12 DIAGNOSIS — I4891 Unspecified atrial fibrillation: Secondary | ICD-10-CM

## 2019-09-12 DIAGNOSIS — Z7901 Long term (current) use of anticoagulants: Secondary | ICD-10-CM

## 2019-09-16 LAB — CBC AND DIFFERENTIAL: Hemoglobin: 12.1 (ref 12.0–16.0)

## 2019-10-03 LAB — CBC AND DIFFERENTIAL: Hemoglobin: 11.6 — AB (ref 12.0–16.0)

## 2019-10-10 LAB — CBC: RBC: 3.76 — AB (ref 3.87–5.11)

## 2019-10-10 LAB — CBC AND DIFFERENTIAL
HCT: 38 (ref 36–46)
Hemoglobin: 12.3 (ref 12.0–16.0)
WBC: 4.2

## 2019-10-17 ENCOUNTER — Encounter: Payer: Self-pay | Admitting: Internal Medicine

## 2019-10-28 ENCOUNTER — Encounter: Payer: Self-pay | Admitting: Internal Medicine

## 2019-10-28 ENCOUNTER — Other Ambulatory Visit: Payer: Self-pay

## 2019-10-28 ENCOUNTER — Ambulatory Visit (INDEPENDENT_AMBULATORY_CARE_PROVIDER_SITE_OTHER): Payer: Medicare Other | Admitting: Internal Medicine

## 2019-10-28 VITALS — BP 118/52 | HR 65 | Temp 97.5°F | Ht 64.0 in | Wt 123.0 lb

## 2019-10-28 DIAGNOSIS — Z992 Dependence on renal dialysis: Secondary | ICD-10-CM

## 2019-10-28 DIAGNOSIS — D696 Thrombocytopenia, unspecified: Secondary | ICD-10-CM | POA: Insufficient documentation

## 2019-10-28 DIAGNOSIS — I770 Arteriovenous fistula, acquired: Secondary | ICD-10-CM

## 2019-10-28 DIAGNOSIS — G8111 Spastic hemiplegia affecting right dominant side: Secondary | ICD-10-CM | POA: Diagnosis not present

## 2019-10-28 DIAGNOSIS — I350 Nonrheumatic aortic (valve) stenosis: Secondary | ICD-10-CM | POA: Diagnosis not present

## 2019-10-28 DIAGNOSIS — I480 Paroxysmal atrial fibrillation: Secondary | ICD-10-CM | POA: Diagnosis not present

## 2019-10-28 DIAGNOSIS — N186 End stage renal disease: Secondary | ICD-10-CM | POA: Diagnosis not present

## 2019-10-28 DIAGNOSIS — E7849 Other hyperlipidemia: Secondary | ICD-10-CM

## 2019-10-28 DIAGNOSIS — I272 Pulmonary hypertension, unspecified: Secondary | ICD-10-CM

## 2019-10-28 DIAGNOSIS — N2581 Secondary hyperparathyroidism of renal origin: Secondary | ICD-10-CM

## 2019-10-28 NOTE — Progress Notes (Signed)
Location:  [facility] clinic Provider:  Kellen Dutch L. Mariea Clonts, D.O., C.M.D.  Code Status: remains full code--need to discuss code status alone Goals of Care:  Advanced Directives 10/28/2019  Does Patient Have a Medical Advance Directive? No  Type of Advance Directive -  Does patient want to make changes to medical advance directive? -  Would patient like information on creating a medical advance directive? No - Patient declined  Pre-existing out of facility DNR order (yellow form or pink MOST form) -   Chief Complaint  Patient presents with  . Medical Management of Chronic Issues    6 month follow up    HPI: Patient is a 86 y.o. female seen today for medical management of chronic diseases.    She lost her daughter since she's been here.  Happened 4/9.  She had breast cancer.  Her son is with her today.   She misses her but she's been doing ok.  Right hand swelling every day and feet swelling.  Right side immobile from the prior stroke.  Unfortunately, she could not do botox injections for her.    Left hand feels like salt is in it.  She cannot hold things well, they slip out.  Only in palmar surface, not dorsum.  Bothers her her at least for 6 mos.  Worse at night.    Cannot even lift the right leg.  Can lift right arm with left.  Is dependent on caregiver or son for adls except to feed herself which frustrates her.  She did not get therapy after her stroke which was never truly confirmed.   [facility]id [facility].  A caregiver helps her prep for HD and helps her get ready in her night clothes at night.    She does wish to continue HD.  She continues to tolerate it, but cannot sleep during sessions for fear she will bleed out or her bp will bottom out and she'll pass out.    Past Medical History:  Diagnosis Date  . Anemia   . Aortic stenosis   . Atrial fibrillation ([place])   . Benign bladder mass   . Chronic cough   . CKD (chronic kidney disease), stage V ([place])    14  years dialysis   . DJD (degenerative joint disease)   . Dyslipidemia   . ESRD on hemodialysis ([place])    "TTS; [place]" (09/27/2016)  . Gout   . HTN (hypertension)   . Nonrheumatic aortic (valve) stenosis   . Obstructive sleep apnea    mild  . Orthostatic syncope    around year 2006  . PAF (paroxysmal atrial fibrillation) ([place])   . Pneumonia   . Pulmonary hypertension ([place])   . RBBB (right bundle branch block)   . Stroke [facility])    patient suffred stroke on the right side   . TIA (transient ischemic attack)     Past Surgical History:  Procedure Laterality Date  . AV FISTULA PLACEMENT Right 02/28/2019   Procedure: REPAIR  FOREARM ARTERIOVENOUS (AV) GORE-TEX GRAFT FOR BLEEDING;  Surgeon: Angelia Mould, MD;  Location: [place];  Service: Vascular;  Laterality: Right;  . BONE MARROW BIOPSY    . CATARACT EXTRACTION W/ INTRAOCULAR LENS  IMPLANT, BILATERAL    . COLONOSCOPY W/ BIOPSIES AND POLYPECTOMY    . left arm dialysis graft    . NEPHRECTOMY     left  . PERIPHERAL VASCULAR BALLOON ANGIOPLASTY Right 03/15/2019   Procedure: PERIPHERAL VASCULAR BALLOON ANGIOPLASTY;  Surgeon: Angelia Mould, MD;  Location: [place] CV LAB;  Service: Cardiovascular;  Laterality: Right;  Arm fistula  . REVISION OF ARTERIOVENOUS GORETEX GRAFT Right 11/24/2014   Procedure: REPLACEMENT OF  MEDIAL SIDE OF RIGHT FORARM ARTERIOVENOUS GORETEX GRAFT;  Surgeon: Conrad Gilbertown, MD;  Location: [place];  Service: Vascular;  Laterality: Right;  . REVISION OF ARTERIOVENOUS GORETEX GRAFT Right 11/28/2015   Procedure: REVISION OF RIGHT ARM ARTERIOVENOUS GORETEX GRAFT;  Surgeon: Serafina Mitchell, MD;  Location: [place];  Service: Vascular;  Laterality: Right;  . THROMBECTOMY AND REVISION OF ARTERIOVENTOUS (AV) GORETEX  GRAFT Right 02/28/2019   Procedure: Thrombectomy And Revision Of Arterioventous (Av) Goretex  Graft;  Surgeon: Angelia Mould, MD;  Location: [place];  Service: Vascular;  Laterality: Right;    . TOTAL ABDOMINAL HYSTERECTOMY      Allergies  Allergen Reactions  . Tape Other (See Comments)    Pulls off skin  . Sulfa Antibiotics Other (See Comments)    Bumps all over body  . Tolectin [Tolmetin]     Outpatient Encounter Medications as of 10/28/2019  Medication Sig  . amiodarone (PACERONE) 200 MG tablet Take 0.5 tablets (100 mg total) by mouth at bedtime.  . calcitonin, salmon, (MIACALCIN/FORTICAL) 200 UNIT/ACT nasal spray Place 1 spray into alternate nostrils daily.  . cinacalcet (SENSIPAR) 60 MG tablet Take 60 mg by mouth daily.  . midodrine (PROAMATINE) 10 MG tablet Take 10 mg by mouth 3 (three) times daily. 30 mins before dialysis  . warfarin (COUMADIN) 2.5 MG tablet TAKE 1 TO 1&1/2 TABLETS DAILY AS DIRECTED BY COUMADIN CLINIC.  . [DISCONTINUED] chlorpheniramine (CHLOR-TRIMETON) 4 MG tablet One pill at night as needed for cough   No facility-administered encounter medications on file as of 10/28/2019.    Review of Systems:  Review of Systems  Constitutional: Positive for malaise/fatigue and weight loss. Negative for chills and fever.  HENT: Positive for hearing loss. Negative for congestion and sore throat.   Eyes: Negative for blurred vision.       Glasses  Respiratory: Negative for cough and shortness of breath.   Cardiovascular: Positive for leg swelling. Negative for chest pain and palpitations.  Gastrointestinal: Negative for abdominal pain, blood in stool, constipation, diarrhea and melena.  Genitourinary:       Does not make urine  Musculoskeletal: Positive for joint pain and myalgias. Negative for falls.  Skin: Negative for itching and rash.  Neurological: Positive for sensory change and focal weakness. Negative for dizziness, loss of consciousness and headaches.  Psychiatric/Behavioral: Negative for depression and memory loss. The patient has insomnia. The patient is not nervous/anxious.        Grief    Health Maintenance  Topic Date Due  . URINE  MICROALBUMIN  Never done  . COVID-19 Vaccine (1) Never done  . TETANUS/TDAP  Never done  . DEXA SCAN  Never done  . PNA vac Low Risk Adult (2 of 2 - PCV13) 02/10/2009  . INFLUENZA VACCINE  12/08/2019    Physical Exam: Vitals:   10/28/19 1314  BP: (!) 118/52  Pulse: 65  Temp: (!) 97.5 F (36.4 C)  TempSrc: Temporal  SpO2: 98%  Weight: 123 lb (55.8 kg)  Height: _0  (1.626 m)   Body mass index is 21.11 kg/m. Physical Exam Constitutional:      General: She is not in acute distress.    Appearance: She is not toxic-appearing.     Comments: Petite, frail female with kyphosis  seated in manual wheelchair with multiple cushions  HENT:     Head: Normocephalic and atraumatic.     Right Ear: External ear normal.     Left Ear: External ear normal.     Nose: No congestion.     Mouth/Throat:     Pharynx: Oropharynx is clear.  Eyes:     Extraocular Movements: Extraocular movements intact.     Conjunctiva/sclera: Conjunctivae normal.     Pupils: Pupils are equal, round, and reactive to light.     Comments: glasses  Cardiovascular:     Rate and Rhythm: Normal rate. Rhythm irregular.     Heart sounds: Murmur heard.      Comments: Right AV fistula Pulmonary:     Effort: Pulmonary effort is normal.     Breath sounds: Normal breath sounds. No rales.  Abdominal:     General: Bowel sounds are normal. There is no distension.     Palpations: Abdomen is soft.     Tenderness: There is no abdominal tenderness. There is no guarding or rebound.  Musculoskeletal:     Cervical back: Neck supple.     Right lower leg: Edema present.     Left lower leg: Edema present.     Comments: Right greater than left edema  Skin:    General: Skin is warm and dry.  Neurological:     Mental Status: She is alert and oriented to person, place, and time. Mental status is at baseline.     Sensory: Sensory deficit present.     Motor: Weakness present.     Gait: Gait abnormal.     Comments: Right  hemiparesis, right side swollen vs left; right hand contracture; diminished sensation in palms bilaterally  Psychiatric:        Mood and Affect: Mood normal.        Behavior: Behavior normal.        Thought Content: Thought content normal.        Judgment: Judgment normal.     Labs reviewed: Basic Metabolic Panel: Recent Labs    02/26/19 0851 02/28/19 1559 03/15/19 1014  NA 137 138 134*  K 4.5 3.1* 3.8  CL 92* 94* 93*  CO2 29 29  --   GLUCOSE 95 100* 83  BUN 56* 9 22  CREATININE 6.90* 1.94* 4.00*  CALCIUM 8.8* 9.1  --    Liver Function Tests: Recent Labs    02/28/19 0000 02/28/19 1559  AST 31 31  ALT 28 28  ALKPHOS 103 103  BILITOT  --  0.4  PROT  --  6.8  ALBUMIN 4.0 4.0   No results for input(s): LIPASE, AMYLASE in the last 8760 hours. No results for input(s): AMMONIA in the last 8760 hours. CBC: Recent Labs    02/26/19 0851 02/28/19 1559 03/15/19 1014  WBC 4.0 5.7  --   NEUTROABS 2.2 3.8  --   HGB 11.1* 11.8* 10.2*  HCT 34.3* 36.2 30.0*  MCV 100.9* 100.0  --   PLT 117* 145*  --    Lipid Panel: No results for input(s): CHOL, HDL, LDLCALC, TRIG, CHOLHDL, LDLDIRECT in the last 8760 hours. Lab Results  Component Value Date   HGBA1C 4.8 08/21/2016    Procedures since last visit: No results found.  Assessment/Plan 1. Right spastic hemiparesis (HCC) -with neuropathic pain -discussed gabapentin but concern it will be flushed out with HD -discussed capsaicin, but needs for hands which would be challenging for risk of getting in eyes/nose,e tc -  unfortunately, not great options here and did not improve with home therapy  2. Severe aortic stenosis -ongoing, but not surgical candidate at her age and with her HD (infection risk)  3. PAF (paroxysmal atrial fibrillation) (HCC) -remains, cont amiodarone, warfarin for hypercoagulable state due to afib  4. ESRD on dialysis [facility]) -continue this as she desires -on midodrine for hypotension  5. A-V fistula  (HCC) -in use, no recent complications (had prior graft with hemorrhage on opposite arm  6. Other hyperlipidemia -not on statin at 89 and would not start her on one  7. Secondary hyperparathyroidism ([place]) -cont sensipar, monitored at HD--obtain copy of latest hd labs to be abstracted into chart then scanned  8. Thrombocytopenia ([place]) -f/u cbc--get last one from HD  9. Pulmonary HTN ([place]) -noted on prior echo, is fully dependent in adls so difficult to evaluate dyspnea for her  Labs/tests ordered:  Obtain copy of HD labs Next appt: 4 mos med mgt  Son was going to call us back with dates of covid vaccines from [facility]  Cleota Pellerito L. Wilene Pharo, D.O. [facility] [address], [place] [address] Cell Phone (Mon-Fri 8am-5pm):  [phone] On Call:  [phone] & follow prompts after 5pm & weekends Office Phone:  [phone] Office Fax:  [phone]

## 2019-10-29 ENCOUNTER — Encounter: Payer: Self-pay | Admitting: Internal Medicine

## 2019-10-31 ENCOUNTER — Encounter: Payer: Self-pay | Admitting: Internal Medicine

## 2019-11-04 ENCOUNTER — Telehealth: Payer: Self-pay

## 2019-11-04 NOTE — Telephone Encounter (Signed)
lmomed for overdue inr 

## 2019-11-06 LAB — PROTIME-INR: INR: 2 — AB (ref 0.9–1.1)

## 2019-11-07 ENCOUNTER — Ambulatory Visit (INDEPENDENT_AMBULATORY_CARE_PROVIDER_SITE_OTHER): Payer: Medicare Other | Admitting: Pharmacist

## 2019-11-07 DIAGNOSIS — I4891 Unspecified atrial fibrillation: Secondary | ICD-10-CM | POA: Diagnosis not present

## 2019-11-07 DIAGNOSIS — Z5181 Encounter for therapeutic drug level monitoring: Secondary | ICD-10-CM

## 2019-11-07 DIAGNOSIS — Z7901 Long term (current) use of anticoagulants: Secondary | ICD-10-CM | POA: Diagnosis not present

## 2019-11-14 LAB — POCT INR: INR: 2.4 (ref 2.0–3.0)

## 2019-11-20 ENCOUNTER — Ambulatory Visit (INDEPENDENT_AMBULATORY_CARE_PROVIDER_SITE_OTHER): Payer: Medicare Other | Admitting: Internal Medicine

## 2019-11-20 DIAGNOSIS — Z5181 Encounter for therapeutic drug level monitoring: Secondary | ICD-10-CM | POA: Diagnosis not present

## 2019-11-20 DIAGNOSIS — Z7901 Long term (current) use of anticoagulants: Secondary | ICD-10-CM | POA: Diagnosis not present

## 2019-11-20 DIAGNOSIS — I4891 Unspecified atrial fibrillation: Secondary | ICD-10-CM | POA: Diagnosis not present

## 2019-11-26 LAB — POCT INR: INR: 2.3 (ref 2.0–3.0)

## 2019-11-27 ENCOUNTER — Ambulatory Visit (INDEPENDENT_AMBULATORY_CARE_PROVIDER_SITE_OTHER): Payer: Medicare Other | Admitting: Cardiology

## 2019-11-27 DIAGNOSIS — I4891 Unspecified atrial fibrillation: Secondary | ICD-10-CM

## 2019-11-27 DIAGNOSIS — Z7901 Long term (current) use of anticoagulants: Secondary | ICD-10-CM

## 2019-11-27 DIAGNOSIS — Z5181 Encounter for therapeutic drug level monitoring: Secondary | ICD-10-CM

## 2019-11-27 LAB — PROTIME-INR

## 2019-12-17 ENCOUNTER — Other Ambulatory Visit: Payer: Self-pay | Admitting: Cardiology

## 2020-01-02 ENCOUNTER — Telehealth: Payer: Self-pay

## 2020-01-02 LAB — POCT INR: INR: 2.3 (ref 2.0–3.0)

## 2020-01-02 NOTE — Telephone Encounter (Signed)
Orders faxed to have inr for coumadin drawn at dialysis

## 2020-01-06 ENCOUNTER — Ambulatory Visit (INDEPENDENT_AMBULATORY_CARE_PROVIDER_SITE_OTHER): Payer: Medicare Other | Admitting: Cardiology

## 2020-01-06 DIAGNOSIS — Z5181 Encounter for therapeutic drug level monitoring: Secondary | ICD-10-CM

## 2020-01-06 DIAGNOSIS — Z7901 Long term (current) use of anticoagulants: Secondary | ICD-10-CM

## 2020-01-06 DIAGNOSIS — I4891 Unspecified atrial fibrillation: Secondary | ICD-10-CM | POA: Diagnosis not present

## 2020-01-22 ENCOUNTER — Ambulatory Visit (INDEPENDENT_AMBULATORY_CARE_PROVIDER_SITE_OTHER): Payer: Medicare Other | Admitting: Cardiology

## 2020-01-22 DIAGNOSIS — Z7901 Long term (current) use of anticoagulants: Secondary | ICD-10-CM

## 2020-01-22 DIAGNOSIS — Z5181 Encounter for therapeutic drug level monitoring: Secondary | ICD-10-CM | POA: Diagnosis not present

## 2020-01-22 DIAGNOSIS — I4891 Unspecified atrial fibrillation: Secondary | ICD-10-CM

## 2020-01-22 LAB — POCT INR: INR: 2.8 (ref 2.0–3.0)

## 2020-02-04 LAB — POCT INR: INR: 2.3 (ref 2.0–3.0)

## 2020-02-06 ENCOUNTER — Ambulatory Visit (INDEPENDENT_AMBULATORY_CARE_PROVIDER_SITE_OTHER): Payer: Medicare Other | Admitting: Cardiology

## 2020-02-06 DIAGNOSIS — I4891 Unspecified atrial fibrillation: Secondary | ICD-10-CM | POA: Diagnosis not present

## 2020-02-06 DIAGNOSIS — Z7901 Long term (current) use of anticoagulants: Secondary | ICD-10-CM

## 2020-02-06 DIAGNOSIS — Z5181 Encounter for therapeutic drug level monitoring: Secondary | ICD-10-CM

## 2020-02-14 LAB — POCT INR: INR: 2.3 (ref 2.0–3.0)

## 2020-02-17 ENCOUNTER — Ambulatory Visit (INDEPENDENT_AMBULATORY_CARE_PROVIDER_SITE_OTHER): Payer: Medicare Other | Admitting: Cardiology

## 2020-02-17 DIAGNOSIS — Z5181 Encounter for therapeutic drug level monitoring: Secondary | ICD-10-CM | POA: Diagnosis not present

## 2020-02-17 DIAGNOSIS — Z7901 Long term (current) use of anticoagulants: Secondary | ICD-10-CM | POA: Diagnosis not present

## 2020-02-17 DIAGNOSIS — I4891 Unspecified atrial fibrillation: Secondary | ICD-10-CM

## 2020-02-21 ENCOUNTER — Encounter: Payer: Self-pay | Admitting: Nurse Practitioner

## 2020-02-21 ENCOUNTER — Other Ambulatory Visit: Payer: Self-pay

## 2020-02-21 ENCOUNTER — Ambulatory Visit (INDEPENDENT_AMBULATORY_CARE_PROVIDER_SITE_OTHER): Payer: Medicare Other | Admitting: Nurse Practitioner

## 2020-02-21 VITALS — BP 136/60 | HR 54 | Temp 96.9°F

## 2020-02-21 DIAGNOSIS — L89152 Pressure ulcer of sacral region, stage 2: Secondary | ICD-10-CM

## 2020-02-21 NOTE — Progress Notes (Signed)
Careteam: Patient Care Team: Gayland Curry, DO as PCP - General (Geriatric Medicine) Minus Breeding, MD as PCP - Cardiology (Cardiology) Donato Heinz, MD as Referring Physician (Nephrology) Latanya Maudlin, MD as Consulting Physician (Orthopedic Surgery) Inocencio Homes, DPM as Consulting Physician (Podiatry) Clent Jacks, MD as Consulting Physician (Ophthalmology) [facility] (Nephrology)  PLACE OF SERVICE:  [place]  Advanced Directive information    Allergies  Allergen Reactions  . Tape Other (See Comments)    Pulls off skin  . Sulfa Antibiotics Other (See Comments)    Bumps all over body  . Tolectin [Tolmetin]     Chief Complaint  Patient presents with  . Acute Visit    Pateint with areas of concern on bottom x 3 months, patients son is treating, and no drainage right now. Here with son Juanda Crumble. Patient confined to a wheelchair since March of 2020.      HPI: Patient is a 86 y.o. female here today for evaluation of pressure sores on her buttocks. She is wheelchair bound with right sided hemiplegia.   however pts son reports she is unable to stand or pivot. The only way to lift her is on a hoyer lift.  He has been self treating her at home and reports they are improving but would like a home health referral to come out to the house to help with treatment.  Reports the areas were sore but better now.  pts sonis keeping area clean and using neosporin.  There is 1 area of breakdown. Top layer of skin is off. Some tenderness below area of breakdown per son.  Considerable better now. New skin.  She goes to HD and her protein is low.  Now has padded wheelchair seat.   Review of Systems:  Review of Systems  Constitutional: Negative for chills and fever.  Skin: Negative for itching and rash.       Skin breakdown    Past Medical History:  Diagnosis Date  . Anemia   . Aortic stenosis   . Atrial fibrillation ([place])   . Benign bladder  mass   . Chronic cough   . CKD (chronic kidney disease), stage V ([place])    14 years dialysis   . DJD (degenerative joint disease)   . Dyslipidemia   . ESRD on hemodialysis ([place])    "TTS; [place]" (09/27/2016)  . Gout   . HTN (hypertension)   . Nonrheumatic aortic (valve) stenosis   . Obstructive sleep apnea    mild  . Orthostatic syncope    around year 2006  . PAF (paroxysmal atrial fibrillation) ([place])   . Pneumonia   . Pulmonary hypertension ([place])   . RBBB (right bundle branch block)   . Stroke [facility])    patient suffred stroke on the right side   . TIA (transient ischemic attack)    Past Surgical History:  Procedure Laterality Date  . AV FISTULA PLACEMENT Right 02/28/2019   Procedure: REPAIR  FOREARM ARTERIOVENOUS (AV) GORE-TEX GRAFT FOR BLEEDING;  Surgeon: Angelia Mould, MD;  Location: [place];  Service: Vascular;  Laterality: Right;  . BONE MARROW BIOPSY    . CATARACT EXTRACTION W/ INTRAOCULAR LENS  IMPLANT, BILATERAL    . COLONOSCOPY W/ BIOPSIES AND POLYPECTOMY    . left arm dialysis graft    . NEPHRECTOMY     left  . PERIPHERAL VASCULAR BALLOON ANGIOPLASTY Right 03/15/2019   Procedure: PERIPHERAL VASCULAR BALLOON ANGIOPLASTY;  Surgeon: Angelia Mould, MD;  Location: [place] CV LAB;  Service: Cardiovascular;  Laterality: Right;  Arm fistula  . REVISION OF ARTERIOVENOUS GORETEX GRAFT Right 11/24/2014   Procedure: REPLACEMENT OF  MEDIAL SIDE OF RIGHT FORARM ARTERIOVENOUS GORETEX GRAFT;  Surgeon: Conrad Deltana, MD;  Location: [place];  Service: Vascular;  Laterality: Right;  . REVISION OF ARTERIOVENOUS GORETEX GRAFT Right 11/28/2015   Procedure: REVISION OF RIGHT ARM ARTERIOVENOUS GORETEX GRAFT;  Surgeon: Serafina Mitchell, MD;  Location: [place];  Service: Vascular;  Laterality: Right;  . THROMBECTOMY AND REVISION OF ARTERIOVENTOUS (AV) GORETEX  GRAFT Right 02/28/2019   Procedure: Thrombectomy And Revision Of Arterioventous (Av) Goretex  Graft;  Surgeon: Angelia Mould, MD;  Location: [place];  Service: Vascular;  Laterality: Right;  . TOTAL ABDOMINAL HYSTERECTOMY     Social History:   reports that she has never smoked. She has never used smokeless tobacco. She reports that she does not drink alcohol and does not use drugs.  Family History  Problem Relation Age of Onset  . Hypertension Mother   . Diabetes Mother   . Hypertension Other   . Cancer Son   . Hypertension Son   . Kidney disease Son   . Cancer Daughter     Medications: Patient's Medications  New Prescriptions   No medications on file  Previous Medications   AMIODARONE (PACERONE) 200 MG TABLET    Take 0.5 tablets (100 mg total) by mouth at bedtime.   CALCITONIN, SALMON, (MIACALCIN/FORTICAL) 200 UNIT/ACT NASAL SPRAY    Place 1 spray into alternate nostrils daily.   CINACALCET (SENSIPAR) 60 MG TABLET    Take 60 mg by mouth daily.   MIDODRINE (PROAMATINE) 10 MG TABLET    Take 10 mg by mouth 3 (three) times daily. 30 mins before dialysis   WARFARIN (COUMADIN) 2.5 MG TABLET    TAKE 1 TO 1&1/2 TABLETS DAILY AS DIRECTED BY COUMADIN CLINIC.  Modified Medications   No medications on file  Discontinued Medications   No medications on file    Physical Exam:  Vitals:   02/21/20 1039  BP: 136/60  Pulse: (!) 54  Temp: (!) 96.9 F (36.1 C)  TempSrc: Temporal  SpO2: 95%   There is no height or weight on file to calculate BMI. Wt Readings from Last 3 Encounters:  10/28/19 123 lb (55.8 kg)  07/19/19 123 lb (55.8 kg)  05/20/19 125 lb (56.7 kg)    Physical Exam Constitutional:      General: She is not in acute distress.    Appearance: She is well-developed. She is not diaphoretic.  HENT:     Head: Normocephalic and atraumatic.  Cardiovascular:     Rate and Rhythm: Normal rate and regular rhythm.     Heart sounds: Murmur heard.   Pulmonary:     Effort: Pulmonary effort is normal.     Breath sounds: Normal breath sounds.  Musculoskeletal:        General: No  tenderness.     Cervical back: Normal range of motion and neck supple.  Skin:    General: Skin is warm and dry.     Comments: Pt was able to lean forward to see quarter sized pressure injury, quarter size white area with pink tissue surrounding.   Neurological:     Mental Status: She is alert.     Labs reviewed: Basic Metabolic Panel: Recent Labs    02/26/19 0851 02/28/19 1559 03/15/19 1014  NA 137 138 134*  K 4.5  3.1* 3.8  CL 92* 94* 93*  CO2 29 29  --   GLUCOSE 95 100* 83  BUN 56* 9 22  CREATININE 6.90* 1.94* 4.00*  CALCIUM 8.8* 9.1  --    Liver Function Tests: Recent Labs    02/28/19 0000 02/28/19 1559  AST 31 31  ALT 28 28  ALKPHOS 103 103  BILITOT  --  0.4  PROT  --  6.8  ALBUMIN 4.0 4.0   No results for input(s): LIPASE, AMYLASE in the last 8760 hours. No results for input(s): AMMONIA in the last 8760 hours. CBC: Recent Labs    02/26/19 0851 02/26/19 0851 02/28/19 1559 02/28/19 1559 03/15/19 1014 03/15/19 1014 09/09/19 0000 09/16/19 0000 10/03/19 0000 10/10/19 0000  WBC 4.0   < > 5.7  --   --   --  4.2  --   --  4.2  NEUTROABS 2.2  --  3.8  --   --   --   --   --   --   --   HGB 11.1*   < > 11.8*   < > 10.2*   < >  --  12.1 11.6* 12.3  HCT 34.3*   < > 36.2  --  30.0*  --   --   --   --  38  MCV 100.9*  --  100.0  --   --   --   --   --   --   --   PLT 117*  --  145*  --   --   --   --   --   --   --    < > = values in this interval not displayed.   Lipid Panel: No results for input(s): CHOL, HDL, LDLCALC, TRIG, CHOLHDL, LDLDIRECT in the last 8760 hours. TSH: No results for input(s): TSH in the last 8760 hours. A1C: Lab Results  Component Value Date   HGBA1C 4.8 08/21/2016     Assessment/Plan 1. Pressure ulcer of sacral region, stage 2 ([place]) -son is applying foam dressing to pressure ulcer, changing daily after she has BM.  -continue pressure reduction surfaces -turning every 2 hours and changing positions frequently.  -increasing  -  Ambulatory referral to [facility] for further evaluation and monitoring.   Next appt: 03/02/2020 with Dr Mariea Clonts.  Carlos American. [place], [facility] [phone]

## 2020-02-21 NOTE — Patient Instructions (Signed)
Cont to use foam dressing changing as needed  Once area has healed and skin is "tougher" can change to barrier cream to protect area.

## 2020-03-02 ENCOUNTER — Ambulatory Visit: Payer: Medicare Other | Admitting: Internal Medicine

## 2020-03-02 DIAGNOSIS — I120 Hypertensive chronic kidney disease with stage 5 chronic kidney disease or end stage renal disease: Secondary | ICD-10-CM

## 2020-03-02 DIAGNOSIS — I69351 Hemiplegia and hemiparesis following cerebral infarction affecting right dominant side: Secondary | ICD-10-CM

## 2020-03-02 DIAGNOSIS — I272 Pulmonary hypertension, unspecified: Secondary | ICD-10-CM

## 2020-03-02 DIAGNOSIS — E785 Hyperlipidemia, unspecified: Secondary | ICD-10-CM

## 2020-03-02 DIAGNOSIS — I350 Nonrheumatic aortic (valve) stenosis: Secondary | ICD-10-CM

## 2020-03-02 DIAGNOSIS — M109 Gout, unspecified: Secondary | ICD-10-CM

## 2020-03-02 DIAGNOSIS — N186 End stage renal disease: Secondary | ICD-10-CM

## 2020-03-02 DIAGNOSIS — D649 Anemia, unspecified: Secondary | ICD-10-CM

## 2020-03-04 ENCOUNTER — Telehealth: Payer: Self-pay | Admitting: *Deleted

## 2020-03-04 NOTE — Telephone Encounter (Signed)
[name] with [facility] called requesting verbal orders for [name] 1x4 and Social Worker eval.  Verbal orders given.

## 2020-03-10 ENCOUNTER — Telehealth: Payer: Self-pay | Admitting: *Deleted

## 2020-03-10 DIAGNOSIS — L89152 Pressure ulcer of sacral region, stage 2: Secondary | ICD-10-CM

## 2020-03-10 NOTE — Telephone Encounter (Signed)
Order signed and printed off at [facility].

## 2020-03-10 NOTE — Telephone Encounter (Signed)
[name] with [facility] called requesting a written Rx for a Air Roho Cushion to help relieve Pressure Ulcer Wounds. Would like order faxed to [facility] Fax: [phone]  Pended Order and sent to Dr. Mariea Clonts for approval.

## 2020-03-11 NOTE — Telephone Encounter (Signed)
Order Faxed to [facility].

## 2020-03-12 LAB — POCT INR: INR: 2.5 (ref 2.0–3.0)

## 2020-03-16 ENCOUNTER — Ambulatory Visit (INDEPENDENT_AMBULATORY_CARE_PROVIDER_SITE_OTHER): Payer: Medicare Other | Admitting: Cardiology

## 2020-03-16 DIAGNOSIS — Z5181 Encounter for therapeutic drug level monitoring: Secondary | ICD-10-CM | POA: Diagnosis not present

## 2020-03-16 DIAGNOSIS — I4891 Unspecified atrial fibrillation: Secondary | ICD-10-CM

## 2020-03-16 DIAGNOSIS — Z7901 Long term (current) use of anticoagulants: Secondary | ICD-10-CM | POA: Diagnosis not present

## 2020-04-10 ENCOUNTER — Other Ambulatory Visit: Payer: Self-pay | Admitting: Cardiology

## 2020-04-11 LAB — PROTIME-INR: INR: 3.3 — AB (ref 0.9–1.1)

## 2020-04-15 ENCOUNTER — Ambulatory Visit (INDEPENDENT_AMBULATORY_CARE_PROVIDER_SITE_OTHER): Payer: Medicare Other | Admitting: Cardiovascular Disease

## 2020-04-15 DIAGNOSIS — Z5181 Encounter for therapeutic drug level monitoring: Secondary | ICD-10-CM | POA: Diagnosis not present

## 2020-04-15 DIAGNOSIS — I4891 Unspecified atrial fibrillation: Secondary | ICD-10-CM

## 2020-04-15 DIAGNOSIS — Z7901 Long term (current) use of anticoagulants: Secondary | ICD-10-CM

## 2020-04-17 ENCOUNTER — Other Ambulatory Visit: Payer: Self-pay | Admitting: Cardiology

## 2020-04-17 DIAGNOSIS — I480 Paroxysmal atrial fibrillation: Secondary | ICD-10-CM

## 2020-05-14 LAB — PROTIME-INR
INR: 1.7 — AB (ref 0.9–1.1)
INR: 1.7 — AB (ref <=1.1)

## 2020-05-19 ENCOUNTER — Ambulatory Visit (INDEPENDENT_AMBULATORY_CARE_PROVIDER_SITE_OTHER): Payer: Medicare Other | Admitting: Pharmacist Clinician (PhC)/ Clinical Pharmacy Specialist

## 2020-05-19 DIAGNOSIS — Z5181 Encounter for therapeutic drug level monitoring: Secondary | ICD-10-CM

## 2020-05-19 DIAGNOSIS — Z7901 Long term (current) use of anticoagulants: Secondary | ICD-10-CM

## 2020-05-19 DIAGNOSIS — I4891 Unspecified atrial fibrillation: Secondary | ICD-10-CM

## 2020-06-12 LAB — POCT INR: INR: 2.7 (ref 2.0–3.0)

## 2020-06-15 ENCOUNTER — Ambulatory Visit (INDEPENDENT_AMBULATORY_CARE_PROVIDER_SITE_OTHER): Payer: Medicare Other | Admitting: Cardiology

## 2020-06-15 DIAGNOSIS — I4891 Unspecified atrial fibrillation: Secondary | ICD-10-CM | POA: Diagnosis not present

## 2020-06-15 DIAGNOSIS — Z5181 Encounter for therapeutic drug level monitoring: Secondary | ICD-10-CM | POA: Diagnosis not present

## 2020-06-15 DIAGNOSIS — Z7901 Long term (current) use of anticoagulants: Secondary | ICD-10-CM

## 2020-06-29 ENCOUNTER — Encounter: Payer: Self-pay | Admitting: Internal Medicine

## 2020-07-02 NOTE — Progress Notes (Addendum)
Cardiology Office Note   Date:  07/03/2020   ID:  Rashel, Okeefe 01-Oct-1921, MRN [id number]  PCP:  Gayland Curry, DO  Cardiologist:   Minus Breeding, MD   Chief Complaint  Patient presents with  . Hypotension     History of Present Illness: [name] is a 87 y.o. female who presents for evaluation of aortic stenosis and atrial fib.  Since I last saw her she has had no new cardiovascular complaints.  She goes to dialysis.  She says that her blood pressure does drop low and may have to stop pulling fluid.  She gets around completely in a wheelchair.  Her son uses a [employer] fast streamer to move her arm to the bedside commode.  She otherwise is in her recliner and sleeps in her recliner.  She has had no new cardiovascular complaints and has no palpitations, presyncope or syncope other than with low blood pressures in dialysis.  She has had no new chest pressure, neck or arm discomfort.  She has had no weight gain or edema.   Past Medical History:  Diagnosis Date  . Anemia   . Aortic stenosis   . Atrial fibrillation ([place])   . Benign bladder mass   . Chronic cough   . CKD (chronic kidney disease), stage V ([place])    14 years dialysis   . DJD (degenerative joint disease)   . Dyslipidemia   . ESRD on hemodialysis ([place])    "TTS; [facility]" (09/27/2016)  . Gout   . HTN (hypertension)   . Nonrheumatic aortic (valve) stenosis   . Obstructive sleep apnea    mild  . Orthostatic syncope    around year 2006  . PAF (paroxysmal atrial fibrillation) ([place])   . Pneumonia   . Pulmonary hypertension ([place])   . RBBB (right bundle branch block)   . Stroke [facility])    patient suffred stroke on the right side   . TIA (transient ischemic attack)     Past Surgical History:  Procedure Laterality Date  . AV FISTULA PLACEMENT Right 02/28/2019   Procedure: REPAIR  FOREARM ARTERIOVENOUS (AV) GORE-TEX GRAFT FOR BLEEDING;  Surgeon: Angelia Mould, MD;  Location: [place];  Service:  Vascular;  Laterality: Right;  . BONE MARROW BIOPSY    . CATARACT EXTRACTION W/ INTRAOCULAR LENS  IMPLANT, BILATERAL    . COLONOSCOPY W/ BIOPSIES AND POLYPECTOMY    . left arm dialysis graft    . NEPHRECTOMY     left  . PERIPHERAL VASCULAR BALLOON ANGIOPLASTY Right 03/15/2019   Procedure: PERIPHERAL VASCULAR BALLOON ANGIOPLASTY;  Surgeon: Angelia Mould, MD;  Location: [place] [facility];  Service: Cardiovascular;  Laterality: Right;  Arm fistula  . REVISION OF ARTERIOVENOUS GORETEX GRAFT Right 11/24/2014   Procedure: REPLACEMENT OF  MEDIAL SIDE OF RIGHT FORARM ARTERIOVENOUS GORETEX GRAFT;  Surgeon: Conrad [place], MD;  Location: [place];  Service: Vascular;  Laterality: Right;  . REVISION OF ARTERIOVENOUS GORETEX GRAFT Right 11/28/2015   Procedure: REVISION OF RIGHT ARM ARTERIOVENOUS GORETEX GRAFT;  Surgeon: Serafina Mitchell, MD;  Location: [place];  Service: Vascular;  Laterality: Right;  . THROMBECTOMY AND REVISION OF ARTERIOVENTOUS (AV) GORETEX  GRAFT Right 02/28/2019   Procedure: Thrombectomy And Revision Of Arterioventous (Av) Goretex  Graft;  Surgeon: Angelia Mould, MD;  Location: [place];  Service: Vascular;  Laterality: Right;  . TOTAL ABDOMINAL HYSTERECTOMY       Current Outpatient Medications  Medication Sig  Dispense Refill  . amiodarone (PACERONE) 200 MG tablet Take 200 mg by mouth daily. Patient takes 1 1/2 tablet Monday. Tuesday- Sunday patient takes  1 tablet    . lidocaine-prilocaine (EMLA) cream SMARTSIG:Sparingly Topical 3 Times a Week    . midodrine (PROAMATINE) 10 MG tablet Take 10 mg by mouth 3 (three) times daily. 30 mins before dialysis    . multivitamin (RENA-VIT) TABS tablet Take 1 tablet by mouth daily.    . SENSIPAR 30 MG tablet Take 30 mg by mouth daily.    Marland Kitchen warfarin (COUMADIN) 2.5 MG tablet TAKE 1 TO 1&1/2 TABLETS DAILY AS DIRECTED BY COUMADIN CLINIC. 135 tablet 0   No current facility-administered medications for this visit.    Allergies:   Tape,  Sulfa antibiotics, and Tolectin [tolmetin]    ROS:  Please see the history of present illness.   Otherwise, review of systems are positive for none.   All other systems are reviewed and negative.    PHYSICAL EXAM: VS:  BP (!) 110/44 (BP Location: Left Arm, Patient Position: Sitting)   SpO2 96%  , BMI There is no height or weight on file to calculate BMI.  GEN:  No distress NECK:  No jugular venous distention at 90 degrees, waveform within normal limits, carotid upstroke brisk and symmetric, no bruits, no thyromegaly LYMPHATICS:  No cervical adenopathy LUNGS:  Clear to auscultation bilaterally BACK:  No CVA tenderness CHEST:  Unremarkable HEART:  S1 and S2 within normal limits, no S3, no S4, no clicks, no rubs, 3 out of 6 systolic late peaking murmur, no diastolic murmurs ABD:  Positive bowel sounds normal in frequency in pitch, no bruits, no rebound, no guarding, unable to assess midline mass or bruit with the patient seated. EXT:  2 plus pulses throughout, no edema, no cyanosis no clubbing SKIN:  No rashes no nodules NEURO:  Cranial nerves II through XII grossly intact, motor grossly intact throughout PSYCH:  Cognitively intact, oriented to person place and time   EKG:  EKG is  ordered today. Sinus rhythm, rate 92, first-degree AV block, right bundle branch block, premature ectopic contractions, no acute ST-T wave changes.  Recent Labs: 10/10/2019: Hemoglobin 12.3   Lab Results  Component Value Date   TSH 2.400 08/20/2016   ALT 28 02/28/2019   AST 31 02/28/2019   ALKPHOS 103 02/28/2019   BILITOT 0.4 02/28/2019   PROT 6.8 02/28/2019   ALBUMIN 4.0 02/28/2019    Lipid Panel    Component Value Date/Time   CHOL 143 08/21/2016 0429   TRIG 62 08/21/2016 0429   HDL 68 08/21/2016 0429   CHOLHDL 2.1 08/21/2016 0429   VLDL 12 08/21/2016 0429   LDLCALC 63 08/21/2016 0429     Lab Results  Component Value Date   TSH 2.400 08/20/2016   ALT 28 02/28/2019   AST 31 02/28/2019    ALKPHOS 103 02/28/2019   BILITOT 0.4 02/28/2019   PROT 6.8 02/28/2019   ALBUMIN 4.0 02/28/2019     Wt Readings from Last 3 Encounters:  10/28/19 123 lb (55.8 kg)  07/19/19 123 lb (55.8 kg)  05/20/19 125 lb (56.7 kg)      Other studies Reviewed: Additional studies/ records that were reviewed today include:  Labs Review of the above records demonstrates:   See elsewhere   ASSESSMENT AND PLAN:  AS:   The patient has severe aortic stenosis.  She has not been interested in a TAVR.  No change in therapy.  Volume  is managed for dialysis.  ATRIAL FIB:   [name] has a CHA2DS2 - VASc score of 6.  She is up-to-date with follow-up with her Coumadin and tolerates this.  I am going to check a TSH and a liver profile today.   PULMONARY HTN:   We are managing this symptomatically.  No change in therapy.  RBBB:   She has no symptomatic bradycardia arrhythmias.  Metoprolol was stopped previously.  No change in therapy.   HTN:   Her blood pressure actually runs low.  She is tolerating her blood pressures with midodrine.    Current medicines are reviewed at length with the patient today.  The patient does not have concerns regarding medicines.  The following changes have been made:  None  Labs/ tests ordered today include:  See above  Orders Placed This Encounter  Procedures  . TSH  . Hepatic function panel     Disposition:   FU with me in 12 months.     Signed, Minus Breeding, MD  07/03/2020 12:36 PM    [place]

## 2020-07-03 ENCOUNTER — Encounter: Payer: Self-pay | Admitting: Cardiology

## 2020-07-03 ENCOUNTER — Other Ambulatory Visit: Payer: Self-pay

## 2020-07-03 ENCOUNTER — Ambulatory Visit (INDEPENDENT_AMBULATORY_CARE_PROVIDER_SITE_OTHER): Payer: Medicare Other | Admitting: Cardiology

## 2020-07-03 VITALS — BP 110/44

## 2020-07-03 DIAGNOSIS — I10 Essential (primary) hypertension: Secondary | ICD-10-CM

## 2020-07-03 DIAGNOSIS — I4891 Unspecified atrial fibrillation: Secondary | ICD-10-CM | POA: Diagnosis not present

## 2020-07-03 DIAGNOSIS — I350 Nonrheumatic aortic (valve) stenosis: Secondary | ICD-10-CM

## 2020-07-03 DIAGNOSIS — I4510 Unspecified right bundle-branch block: Secondary | ICD-10-CM | POA: Diagnosis not present

## 2020-07-03 DIAGNOSIS — I272 Pulmonary hypertension, unspecified: Secondary | ICD-10-CM

## 2020-07-03 DIAGNOSIS — E7849 Other hyperlipidemia: Secondary | ICD-10-CM

## 2020-07-03 NOTE — Patient Instructions (Signed)
Medication Instructions:  No changes *If you need a refill on your cardiac medications before your next appointment, please call your pharmacy*  Lab Work: Your physician recommends that you return for lab work today: TSH, Liver  If you have labs (blood work) drawn today and your tests are completely normal, you will receive your results only by: Marland Kitchen MyChart Message (if you have MyChart) OR . A paper copy in the mail If you have any lab test that is abnormal or we need to change your treatment, we will call you to review the results.  Follow-Up: At [facility], you and your health needs are our priority.  As part of our continuing mission to provide you with exceptional heart care, we have created designated Provider Care Teams.  These Care Teams include your primary Cardiologist (physician) and Advanced Practice Providers (APPs -  Physician Assistants and Nurse Practitioners) who all work together to provide you with the care you need, when you need it.  Your next appointment:   12 month(s) You will receive a reminder letter in the mail two months in advance. If you don't receive a letter, please call our office to schedule the follow-up appointment.  The format for your next appointment:   In Person  Provider:   Minus Breeding, MD

## 2020-07-04 LAB — TSH: TSH: 6.64 u[IU]/mL — ABNORMAL HIGH (ref 0.450–4.500)

## 2020-07-04 LAB — HEPATIC FUNCTION PANEL
ALT: 11 IU/L (ref 0–32)
AST: 18 IU/L (ref 0–40)
Albumin: 4.1 g/dL (ref 3.5–4.6)
Alkaline Phosphatase: 88 IU/L (ref 44–121)
Bilirubin Total: 0.3 mg/dL (ref 0.0–1.2)
Bilirubin, Direct: 0.13 mg/dL (ref 0.00–0.40)
Total Protein: 6.4 g/dL (ref 6.0–8.5)

## 2020-07-06 ENCOUNTER — Other Ambulatory Visit: Payer: Self-pay

## 2020-07-06 DIAGNOSIS — R7989 Other specified abnormal findings of blood chemistry: Secondary | ICD-10-CM

## 2020-07-06 NOTE — Progress Notes (Signed)
T3 and T4 order placed for patient to have labs drawn at dialysis. Spoke with son and informed him of pending orders. Understanding verbalized.

## 2020-07-08 NOTE — Addendum Note (Signed)
Addended by: Orma Render on: 07/08/2020 10:48 AM   Modules accepted: Orders

## 2020-07-15 ENCOUNTER — Telehealth: Payer: Self-pay

## 2020-07-15 NOTE — Telephone Encounter (Signed)
Orders faxed to [facility] for inr lab draw

## 2020-07-18 LAB — PROTIME-INR: INR: 2.3 — AB (ref 0.9–1.1)

## 2020-07-21 ENCOUNTER — Telehealth: Payer: Self-pay

## 2020-07-21 LAB — POCT INR: INR: 2.3 (ref 2.0–3.0)

## 2020-07-21 NOTE — Telephone Encounter (Signed)
Lab orders faxed to dialysis

## 2020-07-22 ENCOUNTER — Ambulatory Visit (INDEPENDENT_AMBULATORY_CARE_PROVIDER_SITE_OTHER): Payer: Medicare Other | Admitting: Cardiovascular Disease

## 2020-07-22 DIAGNOSIS — Z5181 Encounter for therapeutic drug level monitoring: Secondary | ICD-10-CM

## 2020-07-22 DIAGNOSIS — Z7901 Long term (current) use of anticoagulants: Secondary | ICD-10-CM

## 2020-07-22 DIAGNOSIS — I4891 Unspecified atrial fibrillation: Secondary | ICD-10-CM

## 2020-08-13 LAB — PROTIME-INR: INR: 2.2 — AB (ref 0.9–1.1)

## 2020-08-17 ENCOUNTER — Ambulatory Visit (INDEPENDENT_AMBULATORY_CARE_PROVIDER_SITE_OTHER): Payer: Medicare Other | Admitting: Cardiology

## 2020-08-17 DIAGNOSIS — Z5181 Encounter for therapeutic drug level monitoring: Secondary | ICD-10-CM

## 2020-08-17 DIAGNOSIS — Z7901 Long term (current) use of anticoagulants: Secondary | ICD-10-CM

## 2020-08-17 DIAGNOSIS — I4891 Unspecified atrial fibrillation: Secondary | ICD-10-CM | POA: Diagnosis not present

## 2020-09-01 ENCOUNTER — Other Ambulatory Visit: Payer: Self-pay | Admitting: Cardiology

## 2020-09-10 LAB — POCT INR: INR: 2 (ref 2.0–3.0)

## 2020-09-14 ENCOUNTER — Ambulatory Visit (INDEPENDENT_AMBULATORY_CARE_PROVIDER_SITE_OTHER): Payer: Medicare Other | Admitting: Internal Medicine

## 2020-09-14 DIAGNOSIS — I4891 Unspecified atrial fibrillation: Secondary | ICD-10-CM

## 2020-09-14 DIAGNOSIS — Z5181 Encounter for therapeutic drug level monitoring: Secondary | ICD-10-CM

## 2020-09-14 DIAGNOSIS — Z7901 Long term (current) use of anticoagulants: Secondary | ICD-10-CM

## 2020-09-14 NOTE — Progress Notes (Signed)
This encounter was created in error - please disregard.

## 2020-10-08 ENCOUNTER — Telehealth: Payer: Self-pay

## 2020-10-08 LAB — POCT INR: INR: 3.2 — AB (ref 2.0–3.0)

## 2020-10-08 NOTE — Telephone Encounter (Signed)
INR ORDERS FAXED TO DIALYSIS

## 2020-10-12 ENCOUNTER — Ambulatory Visit (INDEPENDENT_AMBULATORY_CARE_PROVIDER_SITE_OTHER): Payer: Medicare Other | Admitting: Cardiology

## 2020-10-12 DIAGNOSIS — Z7901 Long term (current) use of anticoagulants: Secondary | ICD-10-CM | POA: Diagnosis not present

## 2020-10-12 DIAGNOSIS — Z5181 Encounter for therapeutic drug level monitoring: Secondary | ICD-10-CM | POA: Diagnosis not present

## 2020-10-12 DIAGNOSIS — I4891 Unspecified atrial fibrillation: Secondary | ICD-10-CM | POA: Diagnosis not present

## 2020-11-05 LAB — POCT INR: INR: 4.1 — AB (ref 2.0–3.0)

## 2020-11-09 ENCOUNTER — Inpatient Hospital Stay (HOSPITAL_COMMUNITY)
Admission: EM | Admit: 2020-11-09 | Discharge: 2020-11-14 | DRG: 252 | Disposition: A | Discharge status: Hospice | Payer: Medicare Other | Attending: Family Medicine | Admitting: Family Medicine

## 2020-11-09 ENCOUNTER — Other Ambulatory Visit: Payer: Self-pay

## 2020-11-09 ENCOUNTER — Encounter (HOSPITAL_COMMUNITY): Payer: Self-pay

## 2020-11-09 DIAGNOSIS — T82898A Other specified complication of vascular prosthetic devices, implants and grafts, initial encounter: Secondary | ICD-10-CM | POA: Diagnosis present

## 2020-11-09 DIAGNOSIS — Z7901 Long term (current) use of anticoagulants: Secondary | ICD-10-CM

## 2020-11-09 DIAGNOSIS — Y841 Kidney dialysis as the cause of abnormal reaction of the patient, or of later complication, without mention of misadventure at the time of the procedure: Secondary | ICD-10-CM | POA: Diagnosis present

## 2020-11-09 DIAGNOSIS — N2581 Secondary hyperparathyroidism of renal origin: Secondary | ICD-10-CM | POA: Diagnosis present

## 2020-11-09 DIAGNOSIS — K7689 Other specified diseases of liver: Secondary | ICD-10-CM | POA: Diagnosis present

## 2020-11-09 DIAGNOSIS — M109 Gout, unspecified: Secondary | ICD-10-CM | POA: Diagnosis present

## 2020-11-09 DIAGNOSIS — Z905 Acquired absence of kidney: Secondary | ICD-10-CM

## 2020-11-09 DIAGNOSIS — Z66 Do not resuscitate: Secondary | ICD-10-CM | POA: Diagnosis present

## 2020-11-09 DIAGNOSIS — G4733 Obstructive sleep apnea (adult) (pediatric): Secondary | ICD-10-CM | POA: Diagnosis present

## 2020-11-09 DIAGNOSIS — R58 Hemorrhage, not elsewhere classified: Secondary | ICD-10-CM | POA: Diagnosis present

## 2020-11-09 DIAGNOSIS — I132 Hypertensive heart and chronic kidney disease with heart failure and with stage 5 chronic kidney disease, or end stage renal disease: Secondary | ICD-10-CM | POA: Diagnosis present

## 2020-11-09 DIAGNOSIS — L8990 Pressure ulcer of unspecified site, unspecified stage: Secondary | ICD-10-CM | POA: Insufficient documentation

## 2020-11-09 DIAGNOSIS — Z833 Family history of diabetes mellitus: Secondary | ICD-10-CM

## 2020-11-09 DIAGNOSIS — L89322 Pressure ulcer of left buttock, stage 2: Secondary | ICD-10-CM | POA: Diagnosis present

## 2020-11-09 DIAGNOSIS — D689 Coagulation defect, unspecified: Secondary | ICD-10-CM | POA: Diagnosis present

## 2020-11-09 DIAGNOSIS — I480 Paroxysmal atrial fibrillation: Secondary | ICD-10-CM

## 2020-11-09 DIAGNOSIS — L8991 Pressure ulcer of unspecified site, stage 1: Secondary | ICD-10-CM

## 2020-11-09 DIAGNOSIS — T82838A Hemorrhage of vascular prosthetic devices, implants and grafts, initial encounter: Secondary | ICD-10-CM

## 2020-11-09 DIAGNOSIS — R54 Age-related physical debility: Secondary | ICD-10-CM | POA: Diagnosis present

## 2020-11-09 DIAGNOSIS — N186 End stage renal disease: Secondary | ICD-10-CM

## 2020-11-09 DIAGNOSIS — I5032 Chronic diastolic (congestive) heart failure: Secondary | ICD-10-CM | POA: Diagnosis present

## 2020-11-09 DIAGNOSIS — D696 Thrombocytopenia, unspecified: Secondary | ICD-10-CM | POA: Diagnosis present

## 2020-11-09 DIAGNOSIS — Z79899 Other long term (current) drug therapy: Secondary | ICD-10-CM

## 2020-11-09 DIAGNOSIS — Z5309 Procedure and treatment not carried out because of other contraindication: Secondary | ICD-10-CM | POA: Diagnosis not present

## 2020-11-09 DIAGNOSIS — Z20822 Contact with and (suspected) exposure to covid-19: Secondary | ICD-10-CM | POA: Diagnosis present

## 2020-11-09 DIAGNOSIS — D62 Acute posthemorrhagic anemia: Secondary | ICD-10-CM | POA: Diagnosis present

## 2020-11-09 DIAGNOSIS — M79632 Pain in left forearm: Secondary | ICD-10-CM | POA: Diagnosis present

## 2020-11-09 DIAGNOSIS — I9581 Postprocedural hypotension: Secondary | ICD-10-CM | POA: Diagnosis not present

## 2020-11-09 DIAGNOSIS — Z95828 Presence of other vascular implants and grafts: Secondary | ICD-10-CM

## 2020-11-09 DIAGNOSIS — Z992 Dependence on renal dialysis: Secondary | ICD-10-CM

## 2020-11-09 DIAGNOSIS — Z888 Allergy status to other drugs, medicaments and biological substances status: Secondary | ICD-10-CM

## 2020-11-09 DIAGNOSIS — D6832 Hemorrhagic disorder due to extrinsic circulating anticoagulants: Secondary | ICD-10-CM | POA: Diagnosis present

## 2020-11-09 DIAGNOSIS — Z8249 Family history of ischemic heart disease and other diseases of the circulatory system: Secondary | ICD-10-CM

## 2020-11-09 DIAGNOSIS — D539 Nutritional anemia, unspecified: Secondary | ICD-10-CM | POA: Diagnosis not present

## 2020-11-09 DIAGNOSIS — C9000 Multiple myeloma not having achieved remission: Secondary | ICD-10-CM | POA: Diagnosis present

## 2020-11-09 DIAGNOSIS — L89312 Pressure ulcer of right buttock, stage 2: Secondary | ICD-10-CM | POA: Diagnosis present

## 2020-11-09 DIAGNOSIS — T82510A Breakdown (mechanical) of surgically created arteriovenous fistula, initial encounter: Secondary | ICD-10-CM | POA: Diagnosis present

## 2020-11-09 DIAGNOSIS — D631 Anemia in chronic kidney disease: Secondary | ICD-10-CM | POA: Diagnosis present

## 2020-11-09 DIAGNOSIS — I4510 Unspecified right bundle-branch block: Secondary | ICD-10-CM | POA: Diagnosis present

## 2020-11-09 DIAGNOSIS — M7989 Other specified soft tissue disorders: Secondary | ICD-10-CM | POA: Diagnosis present

## 2020-11-09 DIAGNOSIS — I350 Nonrheumatic aortic (valve) stenosis: Secondary | ICD-10-CM | POA: Diagnosis present

## 2020-11-09 DIAGNOSIS — E785 Hyperlipidemia, unspecified: Secondary | ICD-10-CM | POA: Diagnosis present

## 2020-11-09 DIAGNOSIS — Z681 Body mass index (BMI) 19 or less, adult: Secondary | ICD-10-CM

## 2020-11-09 DIAGNOSIS — M40209 Unspecified kyphosis, site unspecified: Secondary | ICD-10-CM | POA: Diagnosis present

## 2020-11-09 DIAGNOSIS — Z882 Allergy status to sulfonamides status: Secondary | ICD-10-CM

## 2020-11-09 DIAGNOSIS — Z419 Encounter for procedure for purposes other than remedying health state, unspecified: Secondary | ICD-10-CM

## 2020-11-09 DIAGNOSIS — Z91048 Other nonmedicinal substance allergy status: Secondary | ICD-10-CM

## 2020-11-09 DIAGNOSIS — Z8673 Personal history of transient ischemic attack (TIA), and cerebral infarction without residual deficits: Secondary | ICD-10-CM

## 2020-11-09 DIAGNOSIS — F0390 Unspecified dementia without behavioral disturbance: Secondary | ICD-10-CM | POA: Diagnosis present

## 2020-11-09 LAB — TYPE AND SCREEN
ABO/RH(D): A POS
Antibody Screen: NEGATIVE

## 2020-11-09 LAB — CBC WITH DIFFERENTIAL/PLATELET
Abs Immature Granulocytes: 0.01 10*3/uL (ref 0.00–0.07)
Basophils Absolute: 0 10*3/uL (ref 0.0–0.1)
Basophils Relative: 1 %
Eosinophils Absolute: 0.2 10*3/uL (ref 0.0–0.5)
Eosinophils Relative: 3 %
HCT: 38.4 % (ref 36.0–46.0)
Hemoglobin: 11.6 g/dL — ABNORMAL LOW (ref 12.0–15.0)
Immature Granulocytes: 0 %
Lymphocytes Relative: 38 %
Lymphs Abs: 1.9 10*3/uL (ref 0.7–4.0)
MCH: 30.4 pg (ref 26.0–34.0)
MCHC: 30.2 g/dL (ref 30.0–36.0)
MCV: 100.8 fL — ABNORMAL HIGH (ref 80.0–100.0)
Monocytes Absolute: 0.5 10*3/uL (ref 0.1–1.0)
Monocytes Relative: 9 %
Neutro Abs: 2.5 10*3/uL (ref 1.7–7.7)
Neutrophils Relative %: 49 %
Platelets: 141 10*3/uL — ABNORMAL LOW (ref 150–400)
RBC: 3.81 MIL/uL — ABNORMAL LOW (ref 3.87–5.11)
RDW: 15.1 % (ref 11.5–15.5)
WBC: 5.1 10*3/uL (ref 4.0–10.5)
nRBC: 0 % (ref 0.0–0.2)

## 2020-11-09 LAB — BASIC METABOLIC PANEL
Anion gap: 14 (ref 5–15)
BUN: 23 mg/dL (ref 8–23)
CO2: 27 mmol/L (ref 22–32)
Calcium: 9.1 mg/dL (ref 8.9–10.3)
Chloride: 98 mmol/L (ref 98–111)
Creatinine, Ser: 4.4 mg/dL — ABNORMAL HIGH (ref 0.44–1.00)
GFR, Estimated: 9 mL/min — ABNORMAL LOW (ref >60)
Glucose, Bld: 80 mg/dL (ref 70–99)
Potassium: 4.1 mmol/L (ref 3.5–5.1)
Sodium: 139 mmol/L (ref 135–145)

## 2020-11-09 LAB — RESP PANEL BY RT-PCR (FLU A&B, COVID) ARPGX2
Influenza A by PCR: NEGATIVE
Influenza B by PCR: NEGATIVE
SARS Coronavirus 2 by RT PCR: NEGATIVE

## 2020-11-09 LAB — HEMOGLOBIN AND HEMATOCRIT, BLOOD
HCT: 36.3 % (ref 36.0–46.0)
Hemoglobin: 11.3 g/dL — ABNORMAL LOW (ref 12.0–15.0)

## 2020-11-09 LAB — PROTIME-INR
INR: 4 — ABNORMAL HIGH (ref 0.8–1.2)
Prothrombin Time: 39.3 seconds — ABNORMAL HIGH (ref 11.4–15.2)

## 2020-11-09 MED ORDER — LIDOCAINE-PRILOCAINE 2.5-2.5 % EX CREA: 1.0000 "application " | TOPICAL_CREAM | CUTANEOUS | Status: DC

## 2020-11-09 MED ORDER — SODIUM CHLORIDE 0.9% FLUSH
3.0000 mL | Freq: Two times a day (BID) | INTRAVENOUS | Status: DC
  Administered 2020-11-09 – 2020-11-13 (×6): 3 mL via INTRAVENOUS

## 2020-11-09 MED ORDER — AMIODARONE HCL 200 MG PO TABS
100.0000 mg | ORAL_TABLET | Freq: Every day | ORAL | Status: DC
  Administered 2020-11-09 – 2020-11-13 (×4): 100 mg via ORAL
  Filled 2020-11-09 (×4): qty 1

## 2020-11-09 MED ORDER — VITAMIN K1 10 MG/ML IJ SOLN
1.0000 mg | Freq: Once | INTRAVENOUS | Status: AC
  Administered 2020-11-09: 1 mg via INTRAVENOUS
  Filled 2020-11-09: qty 0.1

## 2020-11-09 MED ORDER — ACETAMINOPHEN 325 MG PO TABS
650.0000 mg | ORAL_TABLET | Freq: Four times a day (QID) | ORAL | Status: DC | PRN
  Administered 2020-11-10 – 2020-11-14 (×6): 650 mg via ORAL
  Filled 2020-11-09 (×7): qty 2

## 2020-11-09 MED ORDER — CALCITONIN (SALMON) 200 UNIT/ACT NA SOLN
1.0000 | Freq: Every day | NASAL | Status: DC
  Administered 2020-11-10 – 2020-11-13 (×3): 1 via NASAL
  Filled 2020-11-09: qty 3.7

## 2020-11-09 MED ORDER — CINACALCET HCL 30 MG PO TABS
30.0000 mg | ORAL_TABLET | Freq: Every day | ORAL | Status: DC
  Administered 2020-11-12 – 2020-11-14 (×3): 30 mg via ORAL
  Filled 2020-11-09 (×3): qty 1

## 2020-11-09 MED ORDER — MIDODRINE HCL 5 MG PO TABS
10.0000 mg | ORAL_TABLET | Freq: Every day | ORAL | Status: DC
  Administered 2020-11-10: 10 mg via ORAL
  Filled 2020-11-09: qty 2

## 2020-11-09 MED ORDER — RENA-VITE PO TABS
1.0000 | ORAL_TABLET | ORAL | Status: DC
  Administered 2020-11-10: 1 via ORAL
  Filled 2020-11-09: qty 1

## 2020-11-09 MED ORDER — WARFARIN - PHARMACIST DOSING INPATIENT: Freq: Every day | Status: DC

## 2020-11-09 MED ORDER — CHLORHEXIDINE GLUCONATE CLOTH 2 % EX PADS
6.0000 | MEDICATED_PAD | Freq: Every day | CUTANEOUS | Status: DC
  Administered 2020-11-10 – 2020-11-14 (×5): 6 via TOPICAL

## 2020-11-09 MED ORDER — ACETAMINOPHEN 650 MG RE SUPP: 650.0000 mg | Freq: Four times a day (QID) | RECTAL | Status: DC | PRN

## 2020-11-09 MED ORDER — ALBUTEROL SULFATE (2.5 MG/3ML) 0.083% IN NEBU: 2.5000 mg | INHALATION_SOLUTION | Freq: Four times a day (QID) | RESPIRATORY_TRACT | Status: DC | PRN

## 2020-11-09 NOTE — Consult Note (Signed)
[facility]ist [facility]  Patient name: [name] MRN: [id number] DOB: [birth year] Sex: female   REQUESTING PROVIDER:   ER   REASON FOR CONSULT:    Leaving right arm graft  HISTORY OF PRESENT ILLNESS:   [name] is a 87 y.o. female, who presented to the emergency department earlier today with bleeding from her right forearm dialysis graft.  Her graft was placed over 10 years ago and has undergone multiple revisions.  Initially, bleeding was controlled with hemostasis however just prior to discharge it started bleeding again and so I was contacted.  The patient is on Coumadin for atrial fibrillation and her INR is 4.  PAST MEDICAL HISTORY    Past Medical History:  Diagnosis Date   Anemia    Aortic stenosis    Atrial fibrillation ([facility])    Benign bladder mass    Chronic cough    CKD (chronic kidney disease), stage V ([facility])    14 years dialysis    DJD (degenerative joint disease)    Dyslipidemia    ESRD on hemodialysis ([place])    "TTS; [facility]" (09/27/2016)   Gout    HTN (hypertension)    Nonrheumatic aortic (valve) stenosis    Obstructive sleep apnea    mild   Orthostatic syncope    around year 2006   PAF (paroxysmal atrial fibrillation) ([facility])    Pneumonia    Pulmonary hypertension ([facility])    RBBB (right bundle branch block)    Stroke ([place])    patient suffred stroke on the right side    TIA (transient ischemic attack)      FAMILY HISTORY   Family History  Problem Relation Age of Onset   Hypertension Mother    Diabetes Mother    Hypertension Other    Cancer Son    Hypertension Son    Kidney disease Son    Cancer Daughter     SOCIAL HISTORY:   Social History   Socioeconomic History   Marital status: Widowed    Spouse name: Not on file   Number of children: Not on file   Years of education: 12   Highest education level: High school graduate  Occupational History   Occupation:  Retired    Comment: Secretary/administrator  Tobacco Use   Smoking status: Never   Smokeless tobacco: Never  Vaping Use   Vaping Use: Never used  Substance and Sexual Activity   Alcohol use: No    Alcohol/week: 0.0 standard drinks   Drug use: No   Sexual activity: Never  Other Topics Concern   Not on file  Social History Narrative   Social History      Diet? Renal potassium, sodium fluid (40 oz) limitations      Do you drink/eat things with caffeine? coffee      Marital status?                  Divorced                   What year were you married?      Do you live in a house, apartment, assisted living, condo, trailer, etc.? house      Is it one or more stories? 1 story      How many persons live in your home? 1      Do you have any pets in your home? none      Highest level of education completed? High  school       Current or past profession: housekeeper      Do you exercise?           no                           Type & how often?       Advanced Directives      Do you have a living will? no      Do you have a DNR form?            yes                      If not, do you want to discuss one?      Do you have signed POA/HPOA for forms? no      Functional Status      Do you have difficulty bathing or dressing yourself? yes      Do you have difficulty preparing food or eating? yes      Do you have difficulty managing your medications?no      Do you have difficulty managing your finances?no      Do you have difficulty affording your medications? no         Social Determinants of Health   Financial Resource Strain: Not on file  Food Insecurity: Not on file  Transportation Needs: Not on file  Physical Activity: Not on file  Stress: Not on file  Social Connections: Not on file  Intimate Partner Violence: Not on file    ALLERGIES:    Allergies  Allergen Reactions   Tape Other (See Comments)    Pulls off skin   Sulfa Antibiotics Other (See Comments)    Bumps  all over body   Tolectin [Tolmetin]     CURRENT MEDICATIONS:    No current facility-administered medications for this encounter.   Current Outpatient Medications  Medication Sig Dispense Refill   amiodarone (PACERONE) 200 MG tablet Take 200 mg by mouth daily. Patient takes 1 1/2 tablet Monday. Tuesday- Sunday patient takes  1 tablet     lidocaine-prilocaine (EMLA) cream SMARTSIG:Sparingly Topical 3 Times a Week     midodrine (PROAMATINE) 10 MG tablet Take 10 mg by mouth 3 (three) times daily. 30 mins before dialysis     multivitamin (RENA-VIT) TABS tablet Take 1 tablet by mouth daily.     SENSIPAR 30 MG tablet Take 30 mg by mouth daily.     warfarin (COUMADIN) 2.5 MG tablet TAKE 1 TO 1&1/2 TABLETS DAILY AS DIRECTED BY COUMADIN CLINIC. 135 tablet 0    REVIEW OF SYSTEMS:   [X]  denotes positive finding, [ ]  denotes negative finding Cardiac  Comments:  Chest pain or chest pressure:    Shortness of breath upon exertion:    Short of breath when lying flat:    Irregular heart rhythm:        Vascular    Pain in calf, thigh, or hip brought on by ambulation:    Pain in feet at night that wakes you up from your sleep:     Blood clot in your veins:    Leg swelling:         Pulmonary    Oxygen at home:    Productive cough:     Wheezing:         Neurologic    Sudden weakness in arms or legs:  Sudden numbness in arms or legs:     Sudden onset of difficulty speaking or slurred speech:    Temporary loss of vision in one eye:     Problems with dizziness:         Gastrointestinal    Blood in stool:      Vomited blood:         Genitourinary    Burning when urinating:     Blood in urine:        Psychiatric    Major depression:         Hematologic    Bleeding problems:    Problems with blood clotting too easily:        Skin    Rashes or ulcers:        Constitutional    Fever or chills:     PHYSICAL EXAM:   Vitals:   11/09/20 1115 11/09/20 1200 11/09/20 1215  11/09/20 1232  BP: (!) 134/39 (!) 130/37 (!) 129/34 (!) 150/40  Pulse: (!) 57 (!) 57 60 (!) 59  Resp: 18  17 18   Temp:    97.9 F (36.6 C)  TempSrc:    Oral  SpO2: 99% 99% 100% 97%    GENERAL: The patient is a well-nourished female, in no acute distress. The vital signs are documented above. CARDIAC: There is a regular rate and rhythm.  VASCULAR: Active bleeding from a pin size hole within her degenerating dialysis graft. PULMONARY: Nonlabored respirations MUSCULOSKELETAL: There are no major deformities or cyanosis. NEUROLOGIC: No focal weakness or paresthesias are detected. SKIN: See photo below PSYCHIATRIC: The patient has a normal affect.   STUDIES:      ASSESSMENT and PLAN   Bleeding dialysis graft: I was able to inspect the defect within her graft which is a 1 mm skin defect more consistent with a needle puncture then a ulcer.  I placed a pressure dressing which controlled the bleeding.  I recommended that she get admitted to the hospital for correction of her coagulopathy.  Hopefully this will enable the bleeding to stop.  She will likely need a fistulogram during her hospitalization as she likely has central stenosis.  She may also require revision of her graft.   Leia Alf, MD, FACS [facility] [phone] Pager [phone]

## 2020-11-09 NOTE — ED Provider Notes (Signed)
[name] Provider Note   CSN: [id number] Arrival date & time: 11/09/20  5638     History No chief complaint on file.   [name] is a 87 y.o. female.  The history is provided by the patient, the EMS personnel and medical records.  [name] is a 87 y.o. female who presents to the Emergency [facility] complaining of bleeding from fistula. She presents the emergency [facility] by EMS from home for evaluation of bleeding from her dialysis fistula. She is ESR D and dialysis Tuesday, Thursday, Saturday. Her last session was on Saturday and she reserved a full session. She states that they did have to watch her for bleeding at that time. She was then discharged and did well. No reports of trauma. She states that this morning her arm began bleeding spontaneously. She states that it was sporting at times. On EMS arrival fire had already applied dressing and the area was hemostatic. Per report there was significant blood in the home. Patient reports that it was pulsatile. She denies any additional complaints. She is on Coumadin. She states that she is unsure what her last Coumadin level was is this is checked during her dialysis sessions. No reports of rectal bleeding..    Past Medical History:  Diagnosis Date   Anemia    Aortic stenosis    Atrial fibrillation (HCC)    Benign bladder mass    Chronic cough    CKD (chronic kidney disease), stage V ([place])    14 years dialysis    DJD (degenerative joint disease)    Dyslipidemia    ESRD on hemodialysis ([place])    "TTS; [facility]" (09/27/2016)   Gout    HTN (hypertension)    Nonrheumatic aortic (valve) stenosis    Obstructive sleep apnea    mild   Orthostatic syncope    around year 2006   PAF (paroxysmal atrial fibrillation) (HCC)    Pneumonia    Pulmonary hypertension (HCC)    RBBB (right bundle branch block)    Stroke ([place])    patient suffred stroke on the right side    TIA  (transient ischemic attack)     Patient Active Problem List   Diagnosis Date Noted   Bleeding from dialysis shunt, initial encounter ([place]) 11/09/2020   Thrombocytopenia ([place]) 10/28/2019   A-V fistula ([place]) 05/20/2019   Nonrheumatic aortic valve stenosis 03/16/2019   Pulmonary HTN ([place]) 03/16/2019   RBBB 03/16/2019   Educated about COVID-19 virus infection 09/21/2018   Syncope 04/25/2017   Gastritis 04/25/2017   Closed fracture of one rib of right side    Hypotension 02/10/2017   Lactic acidosis 02/10/2017   Bradycardia 10/29/2016   Atypical chest pain 09/27/2016   Essential hypertension 09/27/2016   Other chest pain    Coumadin Rx 09/07/2016   Monitoring for long-term anticoagulant use 08/29/2016   Atrial flutter (HCC)    PAF (paroxysmal atrial fibrillation) ([place]) 08/22/2016   Atrial fibrillation with RVR ([place]) 08/20/2016   Severe aortic stenosis 08/20/2016   New onset a-fib ([place]) 08/20/2016   Staphylococcus aureus bacteremia    Bleeding pseudoaneurysm of right brachiocephalic arteriovenous fistula ([place]) 11/28/2015   Bleeding from dialysis shunt (HCC)    Nausea    Fever in adult 11/27/2015   Hemorrhage of arteriovenous graft ([place]) 11/27/2015   HLD (hyperlipidemia) 11/27/2015   Bleeding 11/27/2015   Cough 05/08/2013   Chronic cough 02/15/2013   Secondary hyperparathyroidism ([place]) 11/21/2012   ESRD on  dialysis ([place]) 11/20/2012    Past Surgical History:  Procedure Laterality Date   AV FISTULA PLACEMENT Right 02/28/2019   Procedure: REPAIR  FOREARM ARTERIOVENOUS (AV) GORE-TEX GRAFT FOR BLEEDING;  Surgeon: Angelia Mould, MD;  Location: [facility];  Service: Vascular;  Laterality: Right;   BONE MARROW BIOPSY     CATARACT EXTRACTION W/ INTRAOCULAR LENS  IMPLANT, BILATERAL     COLONOSCOPY W/ BIOPSIES AND POLYPECTOMY     left arm dialysis graft     NEPHRECTOMY     left   PERIPHERAL VASCULAR BALLOON ANGIOPLASTY Right 03/15/2019   Procedure: PERIPHERAL VASCULAR BALLOON  ANGIOPLASTY;  Surgeon: Angelia Mould, MD;  Location: [place] [facility];  Service: Cardiovascular;  Laterality: Right;  Arm fistula   REVISION OF ARTERIOVENOUS GORETEX GRAFT Right 11/24/2014   Procedure: REPLACEMENT OF  MEDIAL SIDE OF RIGHT FORARM ARTERIOVENOUS GORETEX GRAFT;  Surgeon: Conrad [place], MD;  Location: [place] [facility];  Service: Vascular;  Laterality: Right;   REVISION OF ARTERIOVENOUS GORETEX GRAFT Right 11/28/2015   Procedure: REVISION OF RIGHT ARM ARTERIOVENOUS GORETEX GRAFT;  Surgeon: Serafina Mitchell, MD;  Location: [place] [facility];  Service: Vascular;  Laterality: Right;   THROMBECTOMY AND REVISION OF ARTERIOVENTOUS (AV) GORETEX  GRAFT Right 02/28/2019   Procedure: Thrombectomy And Revision Of Arterioventous (Av) Goretex  Graft;  Surgeon: Angelia Mould, MD;  Location: [facility] OR;  Service: Vascular;  Laterality: Right;   TOTAL ABDOMINAL HYSTERECTOMY       OB History   No obstetric history on file.     Family History  Problem Relation Age of Onset   Hypertension Mother    Diabetes Mother    Hypertension Other    Cancer Son    Hypertension Son    Kidney disease Son    Cancer Daughter     Social History   Tobacco Use   Smoking status: Never   Smokeless tobacco: Never  Vaping Use   Vaping Use: Never used  Substance Use Topics   Alcohol use: No    Alcohol/week: 0.0 standard drinks   Drug use: No    Home Medications Prior to Admission medications   Medication Sig Start Date End Date Taking? Authorizing Provider  amiodarone (PACERONE) 200 MG tablet Take 100 mg by mouth at bedtime.   Yes [provider]  calcitonin, salmon, (MIACALCIN/FORTICAL) 200 UNIT/ACT nasal spray Place 1 spray into alternate nostrils in the morning and at bedtime. 10/28/20  Yes [provider]  lidocaine-prilocaine (EMLA) cream Apply 1 application topically 3 (three) times a week. 06/08/20  Yes [provider]  midodrine (PROAMATINE) 10 MG tablet Take 10 mg by mouth daily.    Yes [provider]  multivitamin (RENA-VIT) TABS tablet Take 1 tablet by mouth once a week. 06/08/20  Yes [provider]  SENSIPAR 30 MG tablet Take 30 mg by mouth daily. 06/22/20  Yes [provider]  warfarin (COUMADIN) 2.5 MG tablet TAKE 1 TO 1&1/2 TABLETS DAILY AS DIRECTED BY COUMADIN CLINIC. Patient taking differently: Take 2.5-3.75 mg by mouth See admin instructions. Take 1.5 tablets (3.75 mg) on Mondays & Take 1 tablet (2.5 mg) all other Days of Week 09/01/20  Yes Minus Breeding, MD  chlorpheniramine (CHLOR-TRIMETON) 4 MG tablet One pill at night as needed for cough 12/22/10 03/18/19  Chesley Mires, MD    Allergies    Tape, Sulfa antibiotics, and Tolectin [tolmetin]  Review of Systems   Review of Systems  All other systems reviewed and  are negative.  Physical Exam Updated Vital Signs BP (!) 162/64   Pulse 88   Temp 97.9 F (36.6 C) (Oral)   Resp 18   SpO2 99%   Physical Exam Vitals and nursing note reviewed.  Constitutional:      Appearance: She is well-developed.  HENT:     Head: Normocephalic and atraumatic.  Cardiovascular:     Rate and Rhythm: Normal rate and regular rhythm.     Heart sounds: Murmur heard.  Pulmonary:     Effort: Pulmonary effort is normal. No respiratory distress.     Breath sounds: Normal breath sounds.  Abdominal:     Palpations: Abdomen is soft.     Tenderness: There is no abdominal tenderness. There is no guarding or rebound.  Musculoskeletal:        General: No tenderness.     Comments: 2+ pitting edema to bilateral lower extremities. There is a dressing in place to the right upper extremity that is clean and dry. There is a distal pulses to the right upper extremity. Brisk cap refill to the digits of the right hand.  Skin:    General: Skin is warm and dry.  Neurological:     Mental Status: She is alert and oriented to person, place, and time.  Psychiatric:        Behavior: Behavior normal.    ED Results /  Procedures / Treatments   Labs (all labs ordered are listed, but only abnormal results are displayed) Labs Reviewed  BASIC METABOLIC PANEL - Abnormal; Notable for the following components:      Result Value   Creatinine, Ser 4.40 (*)    GFR, Estimated 9 (*)    All other components within normal limits  CBC WITH DIFFERENTIAL/PLATELET - Abnormal; Notable for the following components:   RBC 3.81 (*)    Hemoglobin 11.6 (*)    MCV 100.8 (*)    Platelets 141 (*)    All other components within normal limits  PROTIME-INR - Abnormal; Notable for the following components:   Prothrombin Time 39.3 (*)    INR 4.0 (*)    All other components within normal limits  RESP PANEL BY RT-PCR (FLU A&B, COVID) ARPGX2  HEMOGLOBIN AND HEMATOCRIT, BLOOD  TYPE AND SCREEN    EKG None  Radiology No results found.  Procedures Procedures  CRITICAL CARE Performed by: Quintella Reichert   Total critical care time: 40 minutes  Critical care time was exclusive of separately billable procedures and treating other patients.  Critical care was necessary to treat or prevent imminent or life-threatening deterioration.  Critical care was time spent personally by me on the following activities: development of treatment plan with patient and/or surrogate as well as nursing, discussions with consultants, evaluation of patient's response to treatment, examination of patient, obtaining history from patient or surrogate, ordering and performing treatments and interventions, ordering and review of laboratory studies, ordering and review of radiographic studies, pulse oximetry and re-evaluation of patient's condition.   Medications Ordered in ED Medications  sodium chloride flush (NS) 0.9 % injection 3 mL (3 mLs Intravenous Not Given 11/09/20 1449)  acetaminophen (TYLENOL) tablet 650 mg (has no administration in time range)    Or  acetaminophen (TYLENOL) suppository 650 mg (has no administration in time range)  albuterol  (PROVENTIL) (2.5 MG/3ML) 0.083% nebulizer solution 2.5 mg (has no administration in time range)  Chlorhexidine Gluconate Cloth 2 % PADS 6 each (has no administration in time range)  Warfarin -  Pharmacist Dosing Inpatient (has no administration in time range)  phytonadione (VITAMIN K) 1 mg in dextrose 5 % 50 mL IVPB (0 mg Intravenous Stopped 11/09/20 1511)    ED Course  I have reviewed the triage vital signs and the nursing notes.  Pertinent labs & imaging results that were available during my care of the patient were reviewed by me and considered in my medical decision making (see chart for details).    MDM Rules/Calculators/A&P                          patient with ESR D on dialysis as well as a fib on Coumadin here for evaluation of spontaneous bleeding. Initially on ED presentation her bleeding had been controlled. She was observed for several hours in an apartment and had recurrent bleeding. Bleeding could be controlled with direct pressure over the site. Discussed with Dr. problem with vascular surgery. He recommends reversal of her Coumadin in observation admission. Hospitalist consulted for admission. Final Clinical Impression(s) / ED Diagnoses Final diagnoses:  Bleeding due to dialysis catheter placement, initial encounter [facility])  Coagulopathy [facility])    Rx / DC Orders ED Discharge Orders     None        Quintella Reichert, MD 11/09/20 1527

## 2020-11-09 NOTE — Progress Notes (Signed)
ANTICOAGULATION CONSULT NOTE - Initial Consult  Pharmacy Consult for Coumadin Indication: atrial fibrillation  Allergies  Allergen Reactions   Tape Other (See Comments)    Pulls off skin   Sulfa Antibiotics Other (See Comments)    Bumps all over body   Tolectin [Tolmetin]     Patient Measurements:    Vital Signs: Temp: 97.9 F (36.6 C) (07/04 1232) Temp Source: Oral (07/04 1232) BP: 151/38 (07/04 1430) Pulse Rate: 62 (07/04 1430)  Labs: Recent Labs    11/09/20 0921  HGB 11.6*  HCT 38.4  PLT 141*  LABPROT 39.3*  INR 4.0*  CREATININE 4.40*    CrCl cannot be calculated (Unknown ideal weight.).   Medical History: Past Medical History:  Diagnosis Date   Anemia    Aortic stenosis    Atrial fibrillation (HCC)    Benign bladder mass    Chronic cough    CKD (chronic kidney disease), stage V (HCC)    14 years dialysis    DJD (degenerative joint disease)    Dyslipidemia    ESRD on hemodialysis ([place])    "TTS; [facility]" (09/27/2016)   Gout    HTN (hypertension)    Nonrheumatic aortic (valve) stenosis    Obstructive sleep apnea    mild   Orthostatic syncope    around year 2006   PAF (paroxysmal atrial fibrillation) (HCC)    Pneumonia    Pulmonary hypertension (HCC)    RBBB (right bundle branch block)    Stroke ([place])    patient suffred stroke on the right side    TIA (transient ischemic attack)     Medications:  Awaiting home med rec  Assessment: 87 y.o. F presents with bleeding from R forearm dialysis graft. Pressure dressing placed by vascular and bleeding controlled. Pt on warfarin PTA for afib. Admission INR 4 (supratherapeutic). Hgb ok on admission, plt 141. Vit K 1mg  IV given 7/4 1411 for reversal. Home dose: 2.5mg  daily except for 3.75mg  on Mondays  Goal of Therapy:  INR 2-3 Monitor platelets by anticoagulation protocol: Yes   Plan:  Daily PT/INR No coumadin today  Sherlon Handing, PharmD, BCPS Please see amion for complete clinical  pharmacist phone list 11/09/2020,2:48 PM

## 2020-11-09 NOTE — ED Triage Notes (Signed)
Patient arrives from home via EMS due to bleeding right arm fistula. Per EMS, the bleeding was controlled by the fire department while the patient was at home. T/TH/SAT dialysis. Last treatment was on Saturday.   Patient states that she takes coumadin.

## 2020-11-09 NOTE — ED Notes (Signed)
Pt IV taken out and taken off monitor for discharge. Pts fistula began to bleed again. EDP made aware and at bedside

## 2020-11-09 NOTE — Progress Notes (Signed)
NEW ADMISSION NOTE New Admission Note:   Arrival Method: E.D stretcher bed Mental Orientation: Alert to person place  Telemetry:# Assessment: Completed. Skin:Assessed with Roberto Scales R.N.     Stage II  left buttock  2 x 2 x.5      Stage II  right buttocks 2 x 1 x. 1, both pressure  ulcer at posterior buttocks.      -barrier applied and dressed with sacral foam.      Bilateral heels boggy, right heel worse than left heel-both elevated from the mattress IV: Left f.a SL Pain:Denies Tubes: None Safety Measures: Safety Fall Prevention Plan has been given, discussed and signed Admission: Completed [facility] Orientation: Patient has been orientated to the room, unit and staff.  Family:Son at besides,made awar of visitation policy.  Orders have been reviewed and implemented. Will continue to monitor the patient. Call light has been placed within reach and bed alarm has been activated.   [place], Zenon Mayo, RN

## 2020-11-09 NOTE — H&P (Signed)
History and Physical    [name] WSF:[id number] DOB: 01/27/1922 DOA: 11/09/2020  Referring MD/NP/PA: Quintella Reichert, MD PCP: Gayland Curry, DO  Patient coming from: Home via EMS  Chief Complaint: Bleeding from dialysis cath I have personally briefly reviewed patient's old medical records in [place]   HPI: [name] is a 87 y.o. female with medical history significant of ESRD on HD(TTS), PAF on Coumadin, CVA, and dyslipidemia presents with complaints of bleeding for her hemodialysis graft which started this morning.   Patient denies any trauma or injury to onset symptoms.  Blood was pulsing out.  She believes she may have lost a pint of blood.   Patient does complain of some lower extremity swelling.  Her last hemodialysis session was on 7/2, and she reports that she completed a full session at that time.  She denies feeling lightheaded, chest pain, shortness of breath, nausea, vomiting, diarrhea, or blood in her stools to her knowledge.  Her arm was wrapped by the fire department prior to EMS arrival.  ED Course: Upon admission to the emergency department patient was seen to be afebrile with blood pressures 129/34-150/40, and all other vital signs maintained.  Labs significant for hemoglobin 11.6, platelets 141, BUN 23, creatinine 4.4, and INR 4.  Vascular surgery have been formally consulted and recommended pressure dressing and correction of her coagulopathy.  Patient was ordered 1 mg of vitamin K IV.  TRH called to admit.  Review of Systems  Constitutional:  Negative for fever and malaise/fatigue.  HENT:  Negative for sore throat.   Eyes:  Negative for photophobia and pain.  Respiratory:  Negative for shortness of breath.   Cardiovascular:  Positive for leg swelling. Negative for chest pain.  Gastrointestinal:  Negative for abdominal pain, diarrhea and vomiting.  Genitourinary:  Negative for dysuria and hematuria.  Musculoskeletal:  Negative for falls.   Neurological:  Negative for focal weakness and loss of consciousness.  Endo/Heme/Allergies:  Bruises/bleeds easily.  Psychiatric/Behavioral:  Negative for substance abuse.   All other systems reviewed and are negative.  Past Medical History:  Diagnosis Date   Anemia    Aortic stenosis    Atrial fibrillation (HCC)    Benign bladder mass    Chronic cough    CKD (chronic kidney disease), stage V (HCC)    14 years dialysis    DJD (degenerative joint disease)    Dyslipidemia    ESRD on hemodialysis ([place])    "TTS; [facility]" (09/27/2016)   Gout    HTN (hypertension)    Nonrheumatic aortic (valve) stenosis    Obstructive sleep apnea    mild   Orthostatic syncope    around year 2006   PAF (paroxysmal atrial fibrillation) (HCC)    Pneumonia    Pulmonary hypertension (HCC)    RBBB (right bundle branch block)    Stroke ([place])    patient suffred stroke on the right side    TIA (transient ischemic attack)     Past Surgical History:  Procedure Laterality Date   AV FISTULA PLACEMENT Right 02/28/2019   Procedure: REPAIR  FOREARM ARTERIOVENOUS (AV) GORE-TEX GRAFT FOR BLEEDING;  Surgeon: Angelia Mould, MD;  Location: [place];  Service: Vascular;  Laterality: Right;   BONE MARROW BIOPSY     CATARACT EXTRACTION W/ INTRAOCULAR LENS  IMPLANT, BILATERAL     COLONOSCOPY W/ BIOPSIES AND POLYPECTOMY     left arm dialysis graft     NEPHRECTOMY  left   PERIPHERAL VASCULAR BALLOON ANGIOPLASTY Right 03/15/2019   Procedure: PERIPHERAL VASCULAR BALLOON ANGIOPLASTY;  Surgeon: Angelia Mould, MD;  Location: [place] [facility];  Service: Cardiovascular;  Laterality: Right;  Arm fistula   REVISION OF ARTERIOVENOUS GORETEX GRAFT Right 11/24/2014   Procedure: REPLACEMENT OF  MEDIAL SIDE OF RIGHT FORARM ARTERIOVENOUS GORETEX GRAFT;  Surgeon: Conrad Middletown, MD;  Location: [place];  Service: Vascular;  Laterality: Right;   REVISION OF ARTERIOVENOUS GORETEX GRAFT Right 11/28/2015   Procedure:  REVISION OF RIGHT ARM ARTERIOVENOUS GORETEX GRAFT;  Surgeon: Serafina Mitchell, MD;  Location: [place];  Service: Vascular;  Laterality: Right;   THROMBECTOMY AND REVISION OF ARTERIOVENTOUS (AV) GORETEX  GRAFT Right 02/28/2019   Procedure: Thrombectomy And Revision Of Arterioventous (Av) Goretex  Graft;  Surgeon: Angelia Mould, MD;  Location: [facility];  Service: Vascular;  Laterality: Right;   TOTAL ABDOMINAL HYSTERECTOMY       reports that she has never smoked. She has never used smokeless tobacco. She reports that she does not drink alcohol and does not use drugs.  Allergies  Allergen Reactions   Tape Other (See Comments)    Pulls off skin   Sulfa Antibiotics Other (See Comments)    Bumps all over body   Tolectin [Tolmetin]     Family History  Problem Relation Age of Onset   Hypertension Mother    Diabetes Mother    Hypertension Other    Cancer Son    Hypertension Son    Kidney disease Son    Cancer Daughter     Prior to Admission medications   Medication Sig Start Date End Date Taking? Authorizing Provider  amiodarone (PACERONE) 200 MG tablet Take 200 mg by mouth daily. Patient takes 1 1/2 tablet Monday. Tuesday- Sunday patient takes  1 tablet    [provider]  lidocaine-prilocaine (EMLA) cream SMARTSIG:Sparingly Topical 3 Times a Week 06/08/20   [provider]  midodrine (PROAMATINE) 10 MG tablet Take 10 mg by mouth 3 (three) times daily. 30 mins before dialysis    [provider]  multivitamin (RENA-VIT) TABS tablet Take 1 tablet by mouth daily. 06/08/20   [provider]  SENSIPAR 30 MG tablet Take 30 mg by mouth daily. 06/22/20   [provider]  warfarin (COUMADIN) 2.5 MG tablet TAKE 1 TO 1&1/2 TABLETS DAILY AS DIRECTED BY COUMADIN CLINIC. 09/01/20   Minus Breeding, MD  chlorpheniramine (CHLOR-TRIMETON) 4 MG tablet One pill at night as needed for cough 12/22/10 03/18/19  Chesley Mires, MD    Physical Exam:  Constitutional:  Elderly female currently in no acute distress Vitals:   11/09/20 1115 11/09/20 1200 11/09/20 1215 11/09/20 1232  BP: (!) 134/39 (!) 130/37 (!) 129/34 (!) 150/40  Pulse: (!) 57 (!) 57 60 (!) 59  Resp: _0 Temp:    97.9 F (36.6 C)  TempSrc:    Oral  SpO2: 99% 99% 100% 97%   Eyes: PERRL, lids and conjunctivae normal ENMT: Mucous membranes are moist. Posterior pharynx clear of any exudate or lesions.  Neck: normal, supple, no masses, no thyromegaly Respiratory: clear to auscultation bilaterally, no wheezing, no crackles. Normal respiratory effort. No accessory muscle use.  Cardiovascular: Regular rate and rhythm, positive systolic murmur.  +1 pitting lower extremity edema.  Right upper extremity fistula present with palpable thrill. Abdomen: no tenderness, no masses palpated. No hepatosplenomegaly. Bowel sounds positive.  Musculoskeletal: no clubbing / cyanosis. No joint deformity  upper and lower extremities. Good ROM, no contractures. Normal muscle tone.  Skin: Bruising noted  Neurologic: CN 2-12 grossly intact. Sensation intact, DTR normal. Strength 5/5 in all 4.  Psychiatric: Normal judgment and insight. Alert and oriented x 3. Normal mood.     Labs on Admission: I have personally reviewed following labs and imaging studies  CBC: Recent Labs  Lab 11/09/20 0921  WBC 5.1  NEUTROABS 2.5  HGB 11.6*  HCT 38.4  MCV 100.8*  PLT 956*   Basic Metabolic Panel: Recent Labs  Lab 11/09/20 0921  NA 139  K 4.1  CL 98  CO2 27  GLUCOSE 80  BUN 23  CREATININE 4.40*  CALCIUM 9.1   GFR: CrCl cannot be calculated (Unknown ideal weight.). Liver Function Tests: No results for input(s): AST, ALT, ALKPHOS, BILITOT, PROT, ALBUMIN in the last 168 hours. No results for input(s): LIPASE, AMYLASE in the last 168 hours. No results for input(s): AMMONIA in the last 168 hours. Coagulation Profile: Recent Labs  Lab 11/09/20 0921  INR 4.0*   Cardiac Enzymes: No results for  input(s): CKTOTAL, CKMB, CKMBINDEX, TROPONINI in the last 168 hours. BNP (last 3 results) No results for input(s): PROBNP in the last 8760 hours. HbA1C: No results for input(s): HGBA1C in the last 72 hours. CBG: No results for input(s): GLUCAP in the last 168 hours. Lipid Profile: No results for input(s): CHOL, HDL, LDLCALC, TRIG, CHOLHDL, LDLDIRECT in the last 72 hours. Thyroid Function Tests: No results for input(s): TSH, T4TOTAL, FREET4, T3FREE, THYROIDAB in the last 72 hours. Anemia Panel: No results for input(s): VITAMINB12, FOLATE, FERRITIN, TIBC, IRON, RETICCTPCT in the last 72 hours. Urine analysis: No results found for: COLORURINE, APPEARANCEUR, LABSPEC, PHURINE, GLUCOSEU, HGBUR, BILIRUBINUR, KETONESUR, PROTEINUR, UROBILINOGEN, NITRITE, LEUKOCYTESUR Sepsis Labs: No results found for this or any previous visit (from the past 240 hour(s)).   Radiological Exams on Admission: No results found.    Assessment/Plan Bleeding dialysis graft supratherapeutic INR: Patient presented with spontaneous bleeding from her hemodialysis graft.  INR was supratherapeutic at 4.  Patient had been given 1 mg of vitamin K IV. -Admit to a medical telemetry bed -Continue pressure dressing -Monitor PT/INR daily -Plan to hold Coumadin and pharmacy to dose thereafter -Appreciate vascular surgery consultative services, we will follow-up for further recommendations  Macrocytic anemia: Chronic.  Hemoglobin 11.6 g/dL on admission.  Baseline hemoglobin appears to range from 11-12.  Patient reported bleeding about a pint of blood prior to EMS arrival. -Continue to monitor H&H  Paroxysmal atrial fibrillation: Patient appears to be in sinus rhythm at this time. -Continue amiodarone -Coumadin per pharmacy  ESRD on HD: Patient normally dialyzes on Tuesday, Thursday, and Saturday.  Her last complete hemodialysis session was on 7/2. -Nephrology consulted  Thrombocytopenia: Chronic.  Platelet count 141 which  appears near patient's baseline. -Continue to monitor  DNR: Present on admission  DVT prophylaxis: Supratherapeutic INR Code Status: DNR Family Communication: Son updated at bedside Disposition Plan: Hopefully discharge home once medically stable Consults called: Vascular surgery, nephrology Admission status: Observation  Norval Morton MD [facility]   If 7PM-7AM, please contact night-coverage   11/09/2020, 1:51 PM

## 2020-11-10 ENCOUNTER — Inpatient Hospital Stay (HOSPITAL_COMMUNITY): Payer: Medicare Other

## 2020-11-10 ENCOUNTER — Encounter (HOSPITAL_COMMUNITY)
Admission: EM | Disposition: A | Discharge status: Hospice | Payer: Self-pay | Source: Home / Self Care | Attending: Family Medicine

## 2020-11-10 ENCOUNTER — Encounter (HOSPITAL_COMMUNITY): Payer: Self-pay | Admitting: Internal Medicine

## 2020-11-10 ENCOUNTER — Observation Stay (HOSPITAL_COMMUNITY): Payer: Medicare Other | Admitting: Certified Registered"

## 2020-11-10 ENCOUNTER — Observation Stay (HOSPITAL_COMMUNITY): Payer: Medicare Other

## 2020-11-10 DIAGNOSIS — C9000 Multiple myeloma not having achieved remission: Secondary | ICD-10-CM | POA: Diagnosis present

## 2020-11-10 DIAGNOSIS — T82838A Hemorrhage of vascular prosthetic devices, implants and grafts, initial encounter: Secondary | ICD-10-CM | POA: Diagnosis present

## 2020-11-10 DIAGNOSIS — Z66 Do not resuscitate: Secondary | ICD-10-CM | POA: Diagnosis present

## 2020-11-10 DIAGNOSIS — D539 Nutritional anemia, unspecified: Secondary | ICD-10-CM | POA: Diagnosis present

## 2020-11-10 DIAGNOSIS — D6832 Hemorrhagic disorder due to extrinsic circulating anticoagulants: Secondary | ICD-10-CM | POA: Diagnosis present

## 2020-11-10 DIAGNOSIS — I350 Nonrheumatic aortic (valve) stenosis: Secondary | ICD-10-CM | POA: Diagnosis present

## 2020-11-10 DIAGNOSIS — Z681 Body mass index (BMI) 19 or less, adult: Secondary | ICD-10-CM | POA: Diagnosis not present

## 2020-11-10 DIAGNOSIS — Z992 Dependence on renal dialysis: Secondary | ICD-10-CM

## 2020-11-10 DIAGNOSIS — D696 Thrombocytopenia, unspecified: Secondary | ICD-10-CM | POA: Diagnosis present

## 2020-11-10 DIAGNOSIS — I5032 Chronic diastolic (congestive) heart failure: Secondary | ICD-10-CM | POA: Diagnosis present

## 2020-11-10 DIAGNOSIS — N186 End stage renal disease: Secondary | ICD-10-CM | POA: Diagnosis present

## 2020-11-10 DIAGNOSIS — D689 Coagulation defect, unspecified: Secondary | ICD-10-CM | POA: Diagnosis present

## 2020-11-10 DIAGNOSIS — I480 Paroxysmal atrial fibrillation: Secondary | ICD-10-CM | POA: Diagnosis present

## 2020-11-10 DIAGNOSIS — L89312 Pressure ulcer of right buttock, stage 2: Secondary | ICD-10-CM | POA: Diagnosis present

## 2020-11-10 DIAGNOSIS — I132 Hypertensive heart and chronic kidney disease with heart failure and with stage 5 chronic kidney disease, or end stage renal disease: Secondary | ICD-10-CM | POA: Diagnosis present

## 2020-11-10 DIAGNOSIS — N2581 Secondary hyperparathyroidism of renal origin: Secondary | ICD-10-CM | POA: Diagnosis present

## 2020-11-10 DIAGNOSIS — K7689 Other specified diseases of liver: Secondary | ICD-10-CM | POA: Diagnosis present

## 2020-11-10 DIAGNOSIS — D62 Acute posthemorrhagic anemia: Secondary | ICD-10-CM | POA: Diagnosis present

## 2020-11-10 DIAGNOSIS — F0390 Unspecified dementia without behavioral disturbance: Secondary | ICD-10-CM | POA: Diagnosis present

## 2020-11-10 DIAGNOSIS — D631 Anemia in chronic kidney disease: Secondary | ICD-10-CM | POA: Diagnosis present

## 2020-11-10 DIAGNOSIS — Z20822 Contact with and (suspected) exposure to covid-19: Secondary | ICD-10-CM | POA: Diagnosis present

## 2020-11-10 DIAGNOSIS — T82510A Breakdown (mechanical) of surgically created arteriovenous fistula, initial encounter: Secondary | ICD-10-CM | POA: Diagnosis present

## 2020-11-10 DIAGNOSIS — Y841 Kidney dialysis as the cause of abnormal reaction of the patient, or of later complication, without mention of misadventure at the time of the procedure: Secondary | ICD-10-CM | POA: Diagnosis present

## 2020-11-10 DIAGNOSIS — L89322 Pressure ulcer of left buttock, stage 2: Secondary | ICD-10-CM | POA: Diagnosis present

## 2020-11-10 HISTORY — PX: INSERTION OF DIALYSIS CATHETER: SHX1324

## 2020-11-10 HISTORY — PX: AV FISTULA PLACEMENT: SHX1204

## 2020-11-10 LAB — CBC
HCT: 36.1 % (ref 36.0–46.0)
Hemoglobin: 11.3 g/dL — ABNORMAL LOW (ref 12.0–15.0)
MCH: 30.3 pg (ref 26.0–34.0)
MCHC: 31.3 g/dL (ref 30.0–36.0)
MCV: 96.8 fL (ref 80.0–100.0)
Platelets: 99 10*3/uL — ABNORMAL LOW (ref 150–400)
RBC: 3.73 MIL/uL — ABNORMAL LOW (ref 3.87–5.11)
RDW: 14.9 % (ref 11.5–15.5)
WBC: 6.2 10*3/uL (ref 4.0–10.5)
nRBC: 0 % (ref 0.0–0.2)

## 2020-11-10 LAB — PROTIME-INR
INR: 1.4 — ABNORMAL HIGH (ref 0.8–1.2)
Prothrombin Time: 16.7 seconds — ABNORMAL HIGH (ref 11.4–15.2)

## 2020-11-10 LAB — GLUCOSE, CAPILLARY: Glucose-Capillary: 108 mg/dL — ABNORMAL HIGH (ref 70–99)

## 2020-11-10 LAB — SURGICAL PCR SCREEN
MRSA, PCR: NEGATIVE
Staphylococcus aureus: NEGATIVE

## 2020-11-10 SURGERY — INSERTION OF ARTERIOVENOUS (AV) GORE-TEX GRAFT ARM
Anesthesia: Monitor Anesthesia Care | Site: Groin | Laterality: Right

## 2020-11-10 MED ORDER — CEFAZOLIN SODIUM-DEXTROSE 2-4 GM/100ML-% IV SOLN
INTRAVENOUS | Status: AC
  Filled 2020-11-10: qty 100

## 2020-11-10 MED ORDER — CEFAZOLIN SODIUM-DEXTROSE 1-4 GM/50ML-% IV SOLN
1.0000 g | INTRAVENOUS | Status: AC
  Administered 2020-11-11: 1 g via INTRAVENOUS
  Filled 2020-11-10 (×2): qty 50

## 2020-11-10 MED ORDER — HEPARIN SODIUM (PORCINE) 1000 UNIT/ML IJ SOLN
INTRAMUSCULAR | Status: DC | PRN
  Administered 2020-11-10: 6200 [IU]

## 2020-11-10 MED ORDER — 0.9 % SODIUM CHLORIDE (POUR BTL) OPTIME
TOPICAL | Status: DC | PRN
  Administered 2020-11-10: 1000 mL

## 2020-11-10 MED ORDER — LIDOCAINE HCL (PF) 1 % IJ SOLN
INTRAMUSCULAR | Status: DC | PRN
  Administered 2020-11-10: 10 mL

## 2020-11-10 MED ORDER — FENTANYL CITRATE (PF) 250 MCG/5ML IJ SOLN
INTRAMUSCULAR | Status: AC
  Filled 2020-11-10: qty 5

## 2020-11-10 MED ORDER — ORAL CARE MOUTH RINSE: 15.0000 mL | Freq: Once | OROMUCOSAL | Status: AC

## 2020-11-10 MED ORDER — LIDOCAINE-EPINEPHRINE (PF) 1.5 %-1:200000 IJ SOLN
INTRAMUSCULAR | Status: DC | PRN
  Administered 2020-11-10: 30 mL via PERINEURAL

## 2020-11-10 MED ORDER — IODIXANOL 320 MG/ML IV SOLN
INTRAVENOUS | Status: DC | PRN
  Administered 2020-11-10: 20 mL

## 2020-11-10 MED ORDER — ROCURONIUM BROMIDE 10 MG/ML (PF) SYRINGE
PREFILLED_SYRINGE | INTRAVENOUS | Status: AC
  Filled 2020-11-10: qty 10

## 2020-11-10 MED ORDER — MIDODRINE HCL 5 MG PO TABS
10.0000 mg | ORAL_TABLET | Freq: Three times a day (TID) | ORAL | Status: DC
  Administered 2020-11-10 – 2020-11-14 (×9): 10 mg via ORAL
  Filled 2020-11-10 (×5): qty 2

## 2020-11-10 MED ORDER — SODIUM CHLORIDE 0.9 % IV SOLN: INTRAVENOUS | Status: DC

## 2020-11-10 MED ORDER — HEPARIN 6000 UNIT IRRIGATION SOLUTION
Status: AC
  Filled 2020-11-10: qty 500

## 2020-11-10 MED ORDER — CHLORHEXIDINE GLUCONATE 0.12 % MT SOLN: 15.0000 mL | Freq: Once | OROMUCOSAL | Status: AC

## 2020-11-10 MED ORDER — FENTANYL CITRATE (PF) 100 MCG/2ML IJ SOLN
INTRAMUSCULAR | Status: AC
  Filled 2020-11-10: qty 2

## 2020-11-10 MED ORDER — HEPARIN SODIUM (PORCINE) 1000 UNIT/ML IJ SOLN
INTRAMUSCULAR | Status: AC
  Filled 2020-11-10: qty 1

## 2020-11-10 MED ORDER — CHLORHEXIDINE GLUCONATE 0.12 % MT SOLN
OROMUCOSAL | Status: AC
  Administered 2020-11-10: 15 mL via OROMUCOSAL
  Filled 2020-11-10: qty 15

## 2020-11-10 MED ORDER — HEPARIN 6000 UNIT IRRIGATION SOLUTION
Status: DC | PRN
  Administered 2020-11-10: 1

## 2020-11-10 MED ORDER — PROPOFOL 10 MG/ML IV BOLUS
INTRAVENOUS | Status: AC
  Filled 2020-11-10: qty 20

## 2020-11-10 MED ORDER — CEFAZOLIN SODIUM-DEXTROSE 2-3 GM-%(50ML) IV SOLR
INTRAVENOUS | Status: DC | PRN
  Administered 2020-11-10: 2 g via INTRAVENOUS

## 2020-11-10 MED ORDER — LIDOCAINE HCL (PF) 1 % IJ SOLN
INTRAMUSCULAR | Status: AC
  Filled 2020-11-10: qty 30

## 2020-11-10 MED ORDER — LIDOCAINE 2% (20 MG/ML) 5 ML SYRINGE
INTRAMUSCULAR | Status: AC
  Filled 2020-11-10: qty 5

## 2020-11-10 SURGICAL SUPPLY — 65 items
BAG COUNTER SPONGE SURGICOUNT (BAG) ×2 IMPLANT
BANDAGE ESMARK 6X9 LF (GAUZE/BANDAGES/DRESSINGS) IMPLANT
BNDG ELASTIC 4X5.8 VLCR STR LF (GAUZE/BANDAGES/DRESSINGS) ×6 IMPLANT
BNDG ESMARK 6X9 LF (GAUZE/BANDAGES/DRESSINGS)
BNDG GAUZE ELAST 4 BULKY (GAUZE/BANDAGES/DRESSINGS) ×2 IMPLANT
CANISTER SUCT 3000ML PPV (MISCELLANEOUS) ×4 IMPLANT
CANNULA VESSEL 3MM 2 BLNT TIP (CANNULA) ×2 IMPLANT
CATH EMB 4FR 40CM (CATHETERS) ×2 IMPLANT
CATH EMB 4FR 80CM (CATHETERS) ×2 IMPLANT
CLIP VESOCCLUDE MED 24/CT (CLIP) ×4 IMPLANT
CLIP VESOCCLUDE SM WIDE 24/CT (CLIP) ×4 IMPLANT
COVER SURGICAL LIGHT HANDLE (MISCELLANEOUS) ×2 IMPLANT
CUFF TOURN SGL QUICK 24 (TOURNIQUET CUFF)
CUFF TOURN SGL QUICK 34 (TOURNIQUET CUFF)
CUFF TOURN SGL QUICK 42 (TOURNIQUET CUFF) IMPLANT
CUFF TRNQT CYL 24X4X16.5-23 (TOURNIQUET CUFF) IMPLANT
CUFF TRNQT CYL 34X4.125X (TOURNIQUET CUFF) IMPLANT
DECANTER SPIKE VIAL GLASS SM (MISCELLANEOUS) IMPLANT
DERMABOND ADVANCED (GAUZE/BANDAGES/DRESSINGS) ×1
DERMABOND ADVANCED .7 DNX12 (GAUZE/BANDAGES/DRESSINGS) ×1 IMPLANT
DEVICE TORQUE KENDALL .025-038 (MISCELLANEOUS) ×2 IMPLANT
DRAIN HEMOVAC 1/8 X 5 (WOUND CARE) IMPLANT
DRAPE C-ARM 42X72 X-RAY (DRAPES) ×2 IMPLANT
DRAPE CHEST BREAST 15X10 FENES (DRAPES) ×2 IMPLANT
DRAPE X-RAY CASS 24X20 (DRAPES) IMPLANT
DRSG XEROFORM 1X8 (GAUZE/BANDAGES/DRESSINGS) ×2 IMPLANT
ELECT REM PT RETURN 9FT ADLT (ELECTROSURGICAL) ×4
ELECTRODE REM PT RTRN 9FT ADLT (ELECTROSURGICAL) ×3 IMPLANT
EVACUATOR SILICONE 100CC (DRAIN) IMPLANT
GAUZE 4X4 16PLY ~~LOC~~+RFID DBL (SPONGE) ×2 IMPLANT
GAUZE SPONGE 4X4 12PLY STRL (GAUZE/BANDAGES/DRESSINGS) ×2 IMPLANT
GLOVE SURG ENC MOIS LTX SZ7.5 (GLOVE) ×4 IMPLANT
GOWN STRL REUS W/ TWL LRG LVL3 (GOWN DISPOSABLE) ×9 IMPLANT
GOWN STRL REUS W/TWL LRG LVL3 (GOWN DISPOSABLE) ×12
GRAFT PROPATEN STD WALL 6X10 (Vascular Products) ×2 IMPLANT
GUIDEWIRE ANGLED .035X150CM (WIRE) ×2 IMPLANT
KIT BASIN OR (CUSTOM PROCEDURE TRAY) ×4 IMPLANT
KIT MICROPUNCTURE NIT STIFF (SHEATH) ×2 IMPLANT
KIT PALINDROME-P 55CM (CATHETERS) ×2 IMPLANT
KIT TURNOVER KIT B (KITS) ×4 IMPLANT
NS IRRIG 1000ML POUR BTL (IV SOLUTION) ×6 IMPLANT
PACK PERIPHERAL VASCULAR (CUSTOM PROCEDURE TRAY) ×4 IMPLANT
PAD ARMBOARD 7.5X6 YLW CONV (MISCELLANEOUS) ×8 IMPLANT
SET COLLECT BLD 21X3/4 12 (NEEDLE) IMPLANT
SPONGE SURGIFOAM ABS GEL 100 (HEMOSTASIS) IMPLANT
STAPLER VISISTAT 35W (STAPLE) IMPLANT
STOPCOCK 4 WAY LG BORE MALE ST (IV SETS) IMPLANT
SUT ETHILON 3 0 PS 1 (SUTURE) ×12 IMPLANT
SUT PROLENE 5 0 C 1 24 (SUTURE) ×6 IMPLANT
SUT PROLENE 6 0 CC (SUTURE) ×6 IMPLANT
SUT PROLENE 7 0 BV 1 (SUTURE) IMPLANT
SUT PROLENE 7 0 BV1 MDA (SUTURE) IMPLANT
SUT SILK 2 0 SH (SUTURE) ×2 IMPLANT
SUT SILK 3 0 (SUTURE)
SUT SILK 3-0 18XBRD TIE 12 (SUTURE) IMPLANT
SUT VIC AB 2-0 CTX 36 (SUTURE) ×4 IMPLANT
SUT VIC AB 3-0 SH 27 (SUTURE) ×4
SUT VIC AB 3-0 SH 27X BRD (SUTURE) ×5 IMPLANT
TAPE UMBILICAL COTTON 1/8X30 (MISCELLANEOUS) IMPLANT
TOWEL GREEN STERILE (TOWEL DISPOSABLE) ×4 IMPLANT
TRAY FOLEY MTR SLVR 16FR STAT (SET/KITS/TRAYS/PACK) ×2 IMPLANT
TUBING EXTENTION W/L.L. (IV SETS) IMPLANT
UNDERPAD 30X36 HEAVY ABSORB (UNDERPADS AND DIAPERS) ×4 IMPLANT
WATER STERILE IRR 1000ML POUR (IV SOLUTION) ×4 IMPLANT
WIRE TORQFLEX AUST .018X40CM (WIRE) ×6 IMPLANT

## 2020-11-10 NOTE — Transfer of Care (Signed)
Immediate Anesthesia Transfer of Care Note  Patient: [name]  Procedure(s) Performed: LIGATION OF ARTERIOVENOUS (AV) GORE-TEX GRAFT RIGHT ARM (Right: Arm Lower) ULTRASOUND GUIDED INSERTION OF TUNNELED DIALYSIS CATHETER (Left: Groin)  Patient Location: PACU  Anesthesia Type:MAC  Level of Consciousness: patient cooperative  Airway & Oxygen Therapy: Patient Spontanous Breathing  Post-op Assessment: Report given to RN and Post -op Vital signs reviewed and stable  Post vital signs: Reviewed and stable  Last Vitals:  Vitals Value Taken Time  BP 88/31 11/10/20 1448  Temp    Pulse    Resp 21 11/10/20 1449  SpO2    Vitals shown include unvalidated device data.  Last Pain:  Vitals:   11/10/20 1131  TempSrc:   PainSc: 0-No pain         Complications: No notable events documented.

## 2020-11-10 NOTE — Consult Note (Signed)
[facility] Renal Consultation Note    Indication for Consultation:  Management of ESRD/hemodialysis, anemia, hypertension/volume, and secondary hyperparathyroidism. PCP:  HPI: [name] is a 87 y.o. female with ESRD, HTN, Hx CVA, and A-fib (on warfarin) who was admitted with bleeding from AVG.  Pt drowsy this morning, will answer a few questions, but unable to give me a whole Hx. Per notes, brought in by EMS with spontaneous, significant bleeding from her dialysis AVG on 7/4. In the ED, she was noted to have INR 4 - Vit K given. VVS consulted, and plan was to follow with INR reversal. Per notes, was still bleeding this morning despite INR down to 1.4 and so she will be going for angioplasty v. AVG revision with Dr. Trula Slade. Labs otherwise ok, K 4.1, WBC 5.1, Hgb 11.6 -> 11.3 today.  Denies CP or dyspnea, no N/V/D. Afebrile.  Dialyzes on TTS schedule at [facility]. Last full HD was Sat 7/2, she will be due for dialysis today. Looks like last intervention on AVG was 05/2020, 70% inflow and outflow stenotic areas, treated with balloon angioplasty. Her access flow numbers used to screen AVG flow have been low chronically.  Past Medical History:  Diagnosis Date   Anemia    Aortic stenosis    Atrial fibrillation (HCC)    Benign bladder mass    Chronic cough    CKD (chronic kidney disease), stage V (HCC)    14 years dialysis    DJD (degenerative joint disease)    Dyslipidemia    ESRD on hemodialysis ([place])    "TTS; [facility]" (09/27/2016)   Gout    HTN (hypertension)    Nonrheumatic aortic (valve) stenosis    Obstructive sleep apnea    mild   Orthostatic syncope    around year 2006   PAF (paroxysmal atrial fibrillation) (HCC)    Pneumonia    Pulmonary hypertension (HCC)    RBBB (right bundle branch block)    Stroke ([place])    patient suffred stroke on the right side    TIA (transient ischemic attack)    Past Surgical History:  Procedure Laterality  Date   AV FISTULA PLACEMENT Right 02/28/2019   Procedure: REPAIR  FOREARM ARTERIOVENOUS (AV) GORE-TEX GRAFT FOR BLEEDING;  Surgeon: Angelia Mould, MD;  Location: [place];  Service: Vascular;  Laterality: Right;   BONE MARROW BIOPSY     CATARACT EXTRACTION W/ INTRAOCULAR LENS  IMPLANT, BILATERAL     COLONOSCOPY W/ BIOPSIES AND POLYPECTOMY     left arm dialysis graft     NEPHRECTOMY     left   PERIPHERAL VASCULAR BALLOON ANGIOPLASTY Right 03/15/2019   Procedure: PERIPHERAL VASCULAR BALLOON ANGIOPLASTY;  Surgeon: Angelia Mould, MD;  Location: [place] [facility];  Service: Cardiovascular;  Laterality: Right;  Arm fistula   REVISION OF ARTERIOVENOUS GORETEX GRAFT Right 11/24/2014   Procedure: REPLACEMENT OF  MEDIAL SIDE OF RIGHT FORARM ARTERIOVENOUS GORETEX GRAFT;  Surgeon: Conrad Rockton, MD;  Location: [place];  Service: Vascular;  Laterality: Right;   REVISION OF ARTERIOVENOUS GORETEX GRAFT Right 11/28/2015   Procedure: REVISION OF RIGHT ARM ARTERIOVENOUS GORETEX GRAFT;  Surgeon: Serafina Mitchell, MD;  Location: [place];  Service: Vascular;  Laterality: Right;   THROMBECTOMY AND REVISION OF ARTERIOVENTOUS (AV) GORETEX  GRAFT Right 02/28/2019   Procedure: Thrombectomy And Revision Of Arterioventous (Av) Goretex  Graft;  Surgeon: Angelia Mould, MD;  Location: [place];  Service: Vascular;  Laterality: Right;  TOTAL ABDOMINAL HYSTERECTOMY     Family History  Problem Relation Age of Onset   Hypertension Mother    Diabetes Mother    Hypertension Other    Cancer Son    Hypertension Son    Kidney disease Son    Cancer Daughter    Social History:  reports that she has never smoked. She has never used smokeless tobacco. She reports that she does not drink alcohol and does not use drugs.  ROS: As per HPI otherwise negative.  Physical Exam: Vitals:   11/09/20 1734 11/09/20 1835 11/10/20 0517 11/10/20 0952  BP:  (!) 134/38 (!) 115/58 (!) 87/32  Pulse:  67 92 76  Resp:  _0 Temp: 98.1 F (36.7 C) 97.6 F (36.4 C) 98.5 F (36.9 C) 98 F (36.7 C)  TempSrc: Oral Oral  Oral  SpO2:  96% 95% 95%     General: Frail, elderly woman, NAD. Head: Normocephalic, atraumatic, sclera non-icteric, mucus membranes are moist. Neck: Supple without lymphadenopathy/masses.  Lungs: Clear bilaterally to auscultation without wheezes, rales, or rhonchi.  Heart: RRR, 3/6 murmur Abdomen: Soft, non-tender, non-distended with normoactive bowel sounds.  Musculoskeletal:  Strength and tone appear normal for age. Lower extremities: 1+ BLE edema Neuro: Drowsy, but answers simple questions. Moves all extremities spontaneously. Dialysis Access: R forearm AVG with pressure bandage in place  Allergies  Allergen Reactions   Tape Other (See Comments)    Pulls off skin   Sulfa Antibiotics Other (See Comments)    Bumps all over body   Tolectin [Tolmetin]    Prior to Admission medications   Medication Sig Start Date End Date Taking? Authorizing Provider  amiodarone (PACERONE) 200 MG tablet Take 100 mg by mouth at bedtime.   Yes [provider]  calcitonin, salmon, (MIACALCIN/FORTICAL) 200 UNIT/ACT nasal spray Place 1 spray into alternate nostrils in the morning and at bedtime. 10/28/20  Yes [provider]  lidocaine-prilocaine (EMLA) cream Apply 1 application topically 3 (three) times a week. 06/08/20  Yes [provider]  midodrine (PROAMATINE) 10 MG tablet Take 10 mg by mouth daily.   Yes [provider]  multivitamin (RENA-VIT) TABS tablet Take 1 tablet by mouth once a week. 06/08/20  Yes [provider]  SENSIPAR 30 MG tablet Take 30 mg by mouth daily. 06/22/20  Yes [provider]  warfarin (COUMADIN) 2.5 MG tablet TAKE 1 TO 1&1/2 TABLETS DAILY AS DIRECTED BY COUMADIN CLINIC. Patient taking differently: Take 2.5-3.75 mg by mouth See admin instructions. Take 1.5 tablets (3.75 mg) on Mondays & Take 1 tablet (2.5 mg) all other Days of  Week 09/01/20  Yes Minus Breeding, MD  chlorpheniramine (CHLOR-TRIMETON) 4 MG tablet One pill at night as needed for cough 12/22/10 03/18/19  Chesley Mires, MD   Current Facility-Administered Medications  Medication Dose Route Frequency Provider Last Rate Last Admin   0.9 %  sodium chloride infusion   Intravenous Continuous Suzette Battiest, MD       [facility] Hold] acetaminophen (TYLENOL) tablet 650 mg  650 mg Oral Q6H PRN Fuller Plan A, MD   650 mg at 11/10/20 0205   Or   [MAR Hold] acetaminophen (TYLENOL) suppository 650 mg  650 mg Rectal Q6H PRN Norval Morton, MD       [MAR Hold] albuterol (PROVENTIL) (2.5 MG/3ML) 0.083% nebulizer solution 2.5 mg  2.5 mg Nebulization Q6H PRN Norval Morton, MD       [MAR Hold] amiodarone (PACERONE) tablet 100  mg  100 mg Oral QHS Smith, Rondell A, MD   100 mg at 11/09/20 2232   [facility] Hold] calcitonin (salmon) (MIACALCIN/FORTICAL) nasal spray 1 spray  1 spray Alternating Nares Daily Fuller Plan A, MD   1 spray at 11/10/20 1024   ceFAZolin (ANCEF) 2-4 GM/100ML-% IVPB            [MAR Hold] ceFAZolin (ANCEF) IVPB 1 g/50 mL premix  1 g Intravenous To SSTC Serafina Mitchell, MD       [MAR Hold] Chlorhexidine Gluconate Cloth 2 % PADS 6 each  6 each Topical Q0600 Reesa Chew, MD   6 each at 11/10/20 0620   [MAR Hold] cinacalcet (SENSIPAR) tablet 30 mg  30 mg Oral Q breakfast Norval Morton, MD       [facility] Hold] lidocaine-prilocaine (EMLA) cream 1 application  1 application Topical Once per day on Mon Wed Fri Norval Morton, MD       [facility] Hold] midodrine (PROAMATINE) tablet 10 mg  10 mg Oral Daily Fuller Plan A, MD   10 mg at 11/10/20 1023   [MAR Hold] multivitamin (RENA-VIT) tablet 1 tablet  1 tablet Oral Weekly Fuller Plan A, MD   1 tablet at 11/10/20 1023   [MAR Hold] sodium chloride flush (NS) 0.9 % injection 3 mL  3 mL Intravenous Q12H Fuller Plan A, MD   3 mL at 11/09/20 2242   [facility] Hold] Warfarin - Pharmacist Dosing Inpatient   Does not  apply q1600 Franky Macho [facility]       Labs: Basic Metabolic Panel: Recent Labs  Lab 11/09/20 0921  NA 139  K 4.1  CL 98  CO2 27  GLUCOSE 80  BUN 23  CREATININE 4.40*  CALCIUM 9.1   CBC: Recent Labs  Lab 11/09/20 0921 11/09/20 1557 11/10/20 0327  WBC 5.1  --  6.2  NEUTROABS 2.5  --   --   HGB 11.6* 11.3* 11.3*  HCT 38.4 36.3 36.1  MCV 100.8*  --  96.8  PLT 141*  --  99*   Dialysis Orders:  TTS at [facility] 3:30hr, 350/500, EDW 46.5kg, 3K/2Ca, AVG, UFP #4, heparin 2300 bolus - Hectoral 40mg IV q HD - no ESA  Assessment/Plan:  AVG hemorrhage: INR high on admit, still with bleeding despite reversal. Dr. BTrula Sladetaking her for surgical repair v. shuntogram today.  ESRD:  Usual TTS schedule - due for HD today, after access has been fixed.  Hypertension/volume: BP variable with mild LE edema, follow.  Metabolic bone disease: Ca ok, Phos pending. Continue home meds.  A-fib: On warfarin, INR 4 on admit - down to 1.4 today after Vit K. I doubt she is getting any protective benefit from the warfarin at age [age]and CERTAINLY is high risk to remain on it. I would favor discontinuing it.  KVeneta Penton PA-C 11/10/2020, 11:15 AM  [employer]

## 2020-11-10 NOTE — H&P (View-Only) (Signed)
  Progress Note    11/10/2020 7:10 AM * No surgery found *  Subjective:  Says she has sharp pain in left forearm   Vitals:   11/09/20 1835 11/10/20 0517  BP: (!) 134/38 (!) 115/58  Pulse: 67 92  Resp: 16 15  Temp: 97.6 F (36.4 C) 98.5 F (36.9 C)  SpO2: 96% 95%    Physical Exam: General appearance: awake, alert in NAD Respirations: unlabored; no dyspnea at rest Right upper extremity: Pressure dressing in place. Hand is warm with 4/5 grip strength. Motor function and sensation intact. AVG is pulsatile to palpation  CBC    Component Value Date/Time   WBC 6.2 11/10/2020 0327   RBC 3.73 (L) 11/10/2020 0327   HGB 11.3 (L) 11/10/2020 0327   HCT 36.1 11/10/2020 0327   PLT 99 (L) 11/10/2020 0327   MCV 96.8 11/10/2020 0327   MCH 30.3 11/10/2020 0327   MCHC 31.3 11/10/2020 0327   RDW 14.9 11/10/2020 0327   LYMPHSABS 1.9 11/09/2020 0921   MONOABS 0.5 11/09/2020 0921   EOSABS 0.2 11/09/2020 0921   BASOSABS 0.0 11/09/2020 0921    BMET    Component Value Date/Time   NA 139 11/09/2020 0921   K 4.1 11/09/2020 0921   CL 98 11/09/2020 0921   CO2 27 11/09/2020 0921   GLUCOSE 80 11/09/2020 0921   BUN 23 11/09/2020 0921   CREATININE 4.40 (H) 11/09/2020 0921   CALCIUM 9.1 11/09/2020 0921   GFRNONAA 9 (L) 11/09/2020 0921   GFRAA 25 (L) 02/28/2019 1559     Intake/Output Summary (Last 24 hours) at 11/10/2020 0710 Last data filed at 11/10/2020 0517 Gross per 24 hour  Intake 50 ml  Output 0 ml  Net 50 ml    HOSPITAL MEDICATIONS Scheduled Meds:  amiodarone  100 mg Oral QHS   calcitonin (salmon)  1 spray Alternating Nares Daily   Chlorhexidine Gluconate Cloth  6 each Topical Q0600   cinacalcet  30 mg Oral Q breakfast   lidocaine-prilocaine  1 application Topical Once per day on Mon Wed Fri   midodrine  10 mg Oral Daily   multivitamin  1 tablet Oral Weekly   sodium chloride flush  3 mL Intravenous Q12H   Warfarin - Pharmacist Dosing Inpatient   Does not apply q1600    Continuous Infusions: PRN Meds:.acetaminophen **OR** acetaminophen, albuterol  Assessment and Plan: Bleeding from needle puncture of RUE AVG. Dressing in place without evidence of ongoing bleeding. Her INR is now 1.4. Shuntogram +/- revision timing to be determined  Risa Grill, PA-C [facility] [phone] 11/10/2020  7:10 AM   I agree with the above.  I removed her dressing and she was still bleeding despite reversal of her coumadin.  I spoke with the patient and son, the best option is to goto the OR for repair Everyone is in agreement.  Annamarie Major

## 2020-11-10 NOTE — Anesthesia Postprocedure Evaluation (Signed)
Anesthesia Post Note  Patient: [name]  Procedure(s) Performed: LIGATION OF ARTERIOVENOUS (AV) GORE-TEX GRAFT RIGHT ARM (Right: Arm Lower) ULTRASOUND GUIDED INSERTION OF TUNNELED DIALYSIS CATHETER (Left: Groin)     Patient location during evaluation: PACU Anesthesia Type: Regional Level of consciousness: awake and alert Pain management: pain level controlled Vital Signs Assessment: post-procedure vital signs reviewed and stable Respiratory status: spontaneous breathing, nonlabored ventilation, respiratory function stable and patient connected to nasal cannula oxygen Cardiovascular status: stable and blood pressure returned to baseline Postop Assessment: no apparent nausea or vomiting Anesthetic complications: no   No notable events documented.  Last Vitals:  Vitals:   11/10/20 1548 11/10/20 1624  BP: (!) 89/44 (!) 55/35  Pulse: 67 71  Resp: 18 18  Temp: 36.7 C (!) 36.4 C  SpO2: 97% 100%    Last Pain:  Vitals:   11/10/20 1624  TempSrc: Oral  PainSc:                  Tiajuana Amass

## 2020-11-10 NOTE — Anesthesia Preprocedure Evaluation (Addendum)
Anesthesia Evaluation  Patient identified by MRN, date of birth, ID band Patient awake and Patient confused    Reviewed: Allergy & Precautions, NPO status , Patient's Chart, lab work & pertinent test results  Airway Mallampati: II  TM Distance: >3 FB     Dental  (+) Dental Advisory Given   Pulmonary sleep apnea ,    breath sounds clear to auscultation       Cardiovascular hypertension, Pt. on medications + dysrhythmias Atrial Fibrillation + Valvular Problems/Murmurs AS and AI  Rhythm:Regular Rate:Normal     Neuro/Psych TIACVA    GI/Hepatic negative GI ROS, Neg liver ROS,   Endo/Other  negative endocrine ROS  Renal/GU ESRF and DialysisRenal disease     Musculoskeletal  (+) Arthritis ,   Abdominal   Peds  Hematology  (+) anemia ,   Anesthesia Other Findings   Reproductive/Obstetrics                            Lab Results  Component Value Date   WBC 6.2 11/10/2020   HGB 11.3 (L) 11/10/2020   HCT 36.1 11/10/2020   MCV 96.8 11/10/2020   PLT 99 (L) 11/10/2020   Lab Results  Component Value Date   CREATININE 4.40 (H) 11/09/2020   BUN 23 11/09/2020   NA 139 11/09/2020   K 4.1 11/09/2020   CL 98 11/09/2020   CO2 27 11/09/2020    Anesthesia Physical Anesthesia Plan  ASA: 4  Anesthesia Plan: Regional   Post-op Pain Management:    Induction:   PONV Risk Score and Plan: 2 and Ondansetron and Treatment may vary due to age or medical condition  Airway Management Planned: Natural Airway and Simple Face Mask  Additional Equipment: None  Intra-op Plan:   Post-operative Plan:   Informed Consent: I have reviewed the patients History and Physical, chart, labs and discussed the procedure including the risks, benefits and alternatives for the proposed anesthesia with the patient or authorized representative who has indicated his/her understanding and acceptance.       Plan  Discussed with: CRNA  Anesthesia Plan Comments:        Anesthesia Quick Evaluation

## 2020-11-10 NOTE — Anesthesia Procedure Notes (Signed)
Anesthesia Regional Block: Supraclavicular block   Pre-Anesthetic Checklist: , timeout performed,  Correct Patient, Correct Site, Correct Laterality,  Correct Procedure, Correct Position, site marked,  Risks and benefits discussed,  Surgical consent,  Pre-op evaluation,  At surgeon's request and post-op pain management  Laterality: Right  Prep: chloraprep       Needles:  Injection technique: Single-shot  Needle Type: Echogenic Needle     Needle Length: 9cm  Needle Gauge: 21     Additional Needles:   Procedures:,,,, ultrasound used (permanent image in chart),,    Narrative:  Start time: 11/10/2020 11:47 AM End time: 11/10/2020 11:54 AM Injection made incrementally with aspirations every 5 mL.  Performed by: Personally  Anesthesiologist: Suzette Battiest, MD

## 2020-11-10 NOTE — Progress Notes (Signed)
Per Dr. Oneida Alar, please do not restart coumadin for a couple of days and would hold off on restarting heparin until tomorrow 8am.  AVG could not be salvaged.  She will be catheter dependent.   Leontine Locket, [facility] 11/10/2020 1:33 PM

## 2020-11-10 NOTE — Progress Notes (Signed)
Patient came back from OR, VS all normal except B/P ranged from 71-29 systolic and 29-09 diastolic when checked thrice and manually.  Notified MD and increase dose of midodrine given to patient and laid pt in [place] position.  Pt son also said that he always do at home after HD.  Will continue to monitor.

## 2020-11-10 NOTE — Progress Notes (Signed)
Progress Note    [name]  SPQ:[id number] DOB: September 04, 1921  DOA: 11/09/2020 PCP: Gayland Curry, DO    Brief Narrative:     Medical records reviewed and are as summarized below:  [name] is an 87 y.o. female with medical history significant of ESRD on HD(TTS), PAF on Coumadin, CVA, and dyslipidemia presents with complaints of bleeding for her hemodialysis graft which started this morning.   Patient denies any trauma or injury to onset symptoms.     Assessment/Plan:   Principal Problem:   Bleeding from dialysis shunt, initial encounter [facility]) Active Problems:   ESRD on hemodialysis (HCC)   PAF (paroxysmal atrial fibrillation) (HCC)   Thrombocytopenia (HCC)   Macrocytic anemia   Bleeding dialysis graft supratherapeutic INR: Patient presented with spontaneous bleeding from her hemodialysis graft.  INR was supratherapeutic at 4.  Patient had been given 1 mg of vitamin K IV. -vascular surgery consult: OR for repair   Macrocytic anemia: Chronic.  Hemoglobin 11.6 g/dL on admission.  Baseline hemoglobin appears to range from 11-12.  Patient reported bleeding about a pint of blood prior to EMS arrival. -h/h   Paroxysmal atrial fibrillation: Patient appears to be in sinus rhythm at this time. -Continue amiodarone -Coumadin per pharmacy   ESRD on HD: Patient normally dialyzes on Tuesday, Thursday, and Saturday.  Her last complete hemodialysis session was on 7/2. -Nephrology consulted   Thrombocytopenia: Chronic.  -Continue to monitor    Family Communication/Anticipated D/C date and plan/Code Status   DVT prophylaxis: coumadin Code Status: Full Code.  Disposition Plan: Status is: Observation  The patient will require care spanning > 2 midnights and should be moved to inpatient because: Inpatient level of care appropriate due to severity of illness  Dispo: The patient is from: Home              Anticipated d/c is to: Home              Patient currently is  not medically stable to d/c.   Difficult to place patient No         Medical Consultants:   Renal vascular  Subjective:  No complaints   Objective:    Vitals:   11/10/20 0517 11/10/20 0952 11/10/20 1118 11/10/20 1131  BP: (!) 115/58 (!) 87/32 (!) 95/18 (!) 81/11  Pulse: 92 76 77 75  Resp: 15 15 18    Temp: 98.5 F (36.9 C) 98 F (36.7 C) 97.9 F (36.6 C)   TempSrc:  Oral Oral   SpO2: 95% 95% 97% 98%    Intake/Output Summary (Last 24 hours) at 11/10/2020 1155 Last data filed at 11/10/2020 0517 Gross per 24 hour  Intake 50 ml  Output 0 ml  Net 50 ml   There were no vitals filed for this visit.  Exam:  General: Appearance:    Elderly female in no acute distress     Lungs:     respirations unlabored  Heart:    Normal heart rate.            Data Reviewed:   I have personally reviewed following labs and imaging studies:  Labs: Labs show the following:   Basic Metabolic Panel: Recent Labs  Lab 11/09/20 0921  NA 139  K 4.1  CL 98  CO2 27  GLUCOSE 80  BUN 23  CREATININE 4.40*  CALCIUM 9.1   GFR CrCl cannot be calculated (Unknown ideal weight.). Liver Function Tests: No results for input(s):  AST, ALT, ALKPHOS, BILITOT, PROT, ALBUMIN in the last 168 hours. No results for input(s): LIPASE, AMYLASE in the last 168 hours. No results for input(s): AMMONIA in the last 168 hours. Coagulation profile Recent Labs  Lab 11/09/20 0921 11/10/20 0327  INR 4.0* 1.4*    CBC: Recent Labs  Lab 11/09/20 0921 11/09/20 1557 11/10/20 0327  WBC 5.1  --  6.2  NEUTROABS 2.5  --   --   HGB 11.6* 11.3* 11.3*  HCT 38.4 36.3 36.1  MCV 100.8*  --  96.8  PLT 141*  --  99*   Cardiac Enzymes: No results for input(s): CKTOTAL, CKMB, CKMBINDEX, TROPONINI in the last 168 hours. BNP (last 3 results) No results for input(s): PROBNP in the last 8760 hours. CBG: No results for input(s): GLUCAP in the last 168 hours. D-Dimer: No results for input(s): DDIMER in the  last 72 hours. Hgb A1c: No results for input(s): HGBA1C in the last 72 hours. Lipid Profile: No results for input(s): CHOL, HDL, LDLCALC, TRIG, CHOLHDL, LDLDIRECT in the last 72 hours. Thyroid function studies: No results for input(s): TSH, T4TOTAL, T3FREE, THYROIDAB in the last 72 hours.  Invalid input(s): FREET3 Anemia work up: No results for input(s): VITAMINB12, FOLATE, FERRITIN, TIBC, IRON, RETICCTPCT in the last 72 hours. Sepsis Labs: Recent Labs  Lab 11/09/20 0921 11/10/20 0327  WBC 5.1 6.2    Microbiology Recent Results (from the past 240 hour(s))  Resp Panel by RT-PCR (Flu A&B, Covid) Nasopharyngeal Swab     Status: None   Collection Time: 11/09/20  1:26 PM   Specimen: Nasopharyngeal Swab; Nasopharyngeal(NP) swabs in vial transport medium  Result Value Ref Range Status   SARS Coronavirus 2 by RT PCR NEGATIVE NEGATIVE Final    Comment: (NOTE) SARS-CoV-2 target nucleic acids are NOT DETECTED.  The SARS-CoV-2 RNA is generally detectable in upper respiratory specimens during the acute phase of infection. The lowest concentration of SARS-CoV-2 viral copies this assay can detect is 138 copies/mL. A negative result does not preclude SARS-Cov-2 infection and should not be used as the sole basis for treatment or other patient management decisions. A negative result may occur with  improper specimen collection/handling, submission of specimen other than nasopharyngeal swab, presence of viral mutation(s) within the areas targeted by this assay, and inadequate number of viral copies(<138 copies/mL). A negative result must be combined with clinical observations, patient history, and epidemiological information. The expected result is Negative.  Fact Sheet for Patients:  EntrepreneurPulse.com.au  Fact Sheet for Healthcare Providers:  IncredibleEmployment.be  This test is no t yet approved or cleared by the [place] FDA and  has been  authorized for detection and/or diagnosis of SARS-CoV-2 by FDA under an Emergency Use Authorization (EUA). This EUA will remain  in effect (meaning this test can be used) for the duration of the COVID-19 declaration under Section 564(b)(1) of the Act, 21 U.S.C.section 360bbb-3(b)(1), unless the authorization is terminated  or revoked sooner.       Influenza A by PCR NEGATIVE NEGATIVE Final   Influenza B by PCR NEGATIVE NEGATIVE Final    Comment: (NOTE) The Xpert Xpress SARS-CoV-2/FLU/RSV plus assay is intended as an aid in the diagnosis of influenza from Nasopharyngeal swab specimens and should not be used as a sole basis for treatment. Nasal washings and aspirates are unacceptable for Xpert Xpress SARS-CoV-2/FLU/RSV testing.  Fact Sheet for Patients: EntrepreneurPulse.com.au  Fact Sheet for Healthcare Providers: IncredibleEmployment.be  This test is not yet approved or cleared by the  [place] Architectural technologist and has been authorized for detection and/or diagnosis of SARS-CoV-2 by FDA under an Print production planner (EUA). This EUA will remain in effect (meaning this test can be used) for the duration of the COVID-19 declaration under Section 564(b)(1) of the Act, 21 U.S.C. section 360bbb-3(b)(1), unless the authorization is terminated or revoked.  Performed at [facility], [place] [address] [place], [place] [address]     Procedures and diagnostic studies:  No results found.  Medications:    [MAR Hold] amiodarone  100 mg Oral QHS   [MAR Hold] calcitonin (salmon)  1 spray Alternating Nares Daily   [MAR Hold] Chlorhexidine Gluconate Cloth  6 each Topical Q0600   [MAR Hold] cinacalcet  30 mg Oral Q breakfast   fentaNYL       [MAR Hold] lidocaine-prilocaine  1 application Topical Once per day on Mon Wed Fri   [facility] Hold] midodrine  10 mg Oral Daily   [MAR Hold] multivitamin  1 tablet Oral Weekly   [MAR Hold] sodium chloride flush  3  mL Intravenous Q12H   [MAR Hold] Warfarin - Pharmacist Dosing Inpatient   Does not apply q1600   Continuous Infusions:  sodium chloride 10 mL/hr at 11/10/20 1117   ceFAZolin     [MAR Hold]  ceFAZolin (ANCEF) IV       LOS: 0 days   Geradine Girt  [facility]   How to contact the [facility] Attending or Consulting provider [place] or covering provider during after hours [place], for this patient?  Check the care team in [facility] and look for a) attending/consulting [facility] provider listed and b) the [facility] team listed Log into [email or web address] and use 's universal password to access. If you do not have the password, please contact the hospital operator. Locate the [facility] provider you are looking for under [facility] and page to a number that you can be directly reached. If you still have difficulty reaching the provider, please page the [facility] (Director on Call) for the Hospitalists listed on amion for assistance.  11/10/2020, 11:55 AM

## 2020-11-10 NOTE — Progress Notes (Signed)
[place] for Coumadin Indication: atrial fibrillation  Allergies  Allergen Reactions   Tape Other (See Comments)    Pulls off skin   Sulfa Antibiotics Other (See Comments)    Bumps all over body   Tolectin [Tolmetin]     Patient Measurements:    Vital Signs: Temp: 98.5 F (36.9 C) (07/05 0517) BP: 115/58 (07/05 0517) Pulse Rate: 92 (07/05 0517)  Labs: Recent Labs    11/09/20 0921 11/09/20 1557 11/10/20 0327  HGB 11.6* 11.3* 11.3*  HCT 38.4 36.3 36.1  PLT 141*  --  99*  LABPROT 39.3*  --  16.7*  INR 4.0*  --  1.4*  CREATININE 4.40*  --   --      CrCl cannot be calculated (Unknown ideal weight.).   Medications:  Awaiting home med rec  Assessment: 87 y.o. F presents with bleeding from R forearm dialysis graft. Pressure dressing placed by vascular and bleeding controlled. Pt on warfarin PTA for afib. Admission INR 4 (supratherapeutic). Hgb ok on admission, plt 141. Vit K 1mg  IV given 7/4 1411 for reversal. Home dose: 2.5mg  daily except for 3.75mg  on Mondays  INR down to 1.4, planning access revision?  Goal of Therapy:  INR 2-3 Monitor platelets by anticoagulation protocol: Yes   Plan:  Daily PT/INR Will continue to hold warfarin pending plans  Thank you Anette Guarneri, PharmD 11/10/2020,9:13 AM

## 2020-11-10 NOTE — Progress Notes (Addendum)
  Progress Note    11/10/2020 7:10 AM * No surgery found *  Subjective:  Says she has sharp pain in left forearm   Vitals:   11/09/20 1835 11/10/20 0517  BP: (!) 134/38 (!) 115/58  Pulse: 67 92  Resp: 16 15  Temp: 97.6 F (36.4 C) 98.5 F (36.9 C)  SpO2: 96% 95%    Physical Exam: General appearance: awake, alert in NAD Respirations: unlabored; no dyspnea at rest Right upper extremity: Pressure dressing in place. Hand is warm with 4/5 grip strength. Motor function and sensation intact. AVG is pulsatile to palpation  CBC    Component Value Date/Time   WBC 6.2 11/10/2020 0327   RBC 3.73 (L) 11/10/2020 0327   HGB 11.3 (L) 11/10/2020 0327   HCT 36.1 11/10/2020 0327   PLT 99 (L) 11/10/2020 0327   MCV 96.8 11/10/2020 0327   MCH 30.3 11/10/2020 0327   MCHC 31.3 11/10/2020 0327   RDW 14.9 11/10/2020 0327   LYMPHSABS 1.9 11/09/2020 0921   MONOABS 0.5 11/09/2020 0921   EOSABS 0.2 11/09/2020 0921   BASOSABS 0.0 11/09/2020 0921    BMET    Component Value Date/Time   NA 139 11/09/2020 0921   K 4.1 11/09/2020 0921   CL 98 11/09/2020 0921   CO2 27 11/09/2020 0921   GLUCOSE 80 11/09/2020 0921   BUN 23 11/09/2020 0921   CREATININE 4.40 (H) 11/09/2020 0921   CALCIUM 9.1 11/09/2020 0921   GFRNONAA 9 (L) 11/09/2020 0921   GFRAA 25 (L) 02/28/2019 1559     Intake/Output Summary (Last 24 hours) at 11/10/2020 0710 Last data filed at 11/10/2020 0517 Gross per 24 hour  Intake 50 ml  Output 0 ml  Net 50 ml    HOSPITAL MEDICATIONS Scheduled Meds:  amiodarone  100 mg Oral QHS   calcitonin (salmon)  1 spray Alternating Nares Daily   Chlorhexidine Gluconate Cloth  6 each Topical Q0600   cinacalcet  30 mg Oral Q breakfast   lidocaine-prilocaine  1 application Topical Once per day on Mon Wed Fri   midodrine  10 mg Oral Daily   multivitamin  1 tablet Oral Weekly   sodium chloride flush  3 mL Intravenous Q12H   Warfarin - Pharmacist Dosing Inpatient   Does not apply q1600    Continuous Infusions: PRN Meds:.acetaminophen **OR** acetaminophen, albuterol  Assessment and Plan: Bleeding from needle puncture of RUE AVG. Dressing in place without evidence of ongoing bleeding. Her INR is now 1.4. Shuntogram +/- revision timing to be determined  Risa Grill, PA-C [facility] [phone] 11/10/2020  7:10 AM   I agree with the above.  I removed her dressing and she was still bleeding despite reversal of her coumadin.  I spoke with the patient and son, the best option is to goto the OR for repair Everyone is in agreement.  Annamarie Major

## 2020-11-10 NOTE — Interval H&P Note (Signed)
History and Physical Interval Note:  11/10/2020 11:10 AM  [place]  has presented today for surgery, with the diagnosis of Bleeding Graft.  The various methods of treatment have been discussed with the patient and family. After consideration of risks, benefits and other options for treatment, the patient has consented to  Procedure(s): REPAIR OF ARTERIOVENOUS (AV) GORE-TEX GRAFT RIGHT ARM as a surgical intervention.  The patient's history has been reviewed, patient examined, no change in status, stable for surgery.  I have reviewed the patient's chart and labs.  Questions were answered to the patient's satisfaction.     Ruta Hinds

## 2020-11-10 NOTE — Progress Notes (Signed)
Dr. Ola Spurr made aware of low BP as documented; MD at bedside currently. Son also at bedside and involved in plan of care.

## 2020-11-10 NOTE — Op Note (Addendum)
Procedure: 1.  Excision and ligation of right forearm AV graft  2.  Insertion of left femoral tunneled dialysis catheter  3.  Attempted insertion of right femoral catheter  4.  Inferior venacavogram  Preoperative diagnosis: End-stage renal disease with hemorrhage from AV graft  Postoperative diagnosis: Same  Assistant: Leontine Locket, PA-C to expedite procedure.  Anesthesia: Axillary block with local supplementation  Operative findings: 1.  Segment of graft excised along the venous limb of graft over about 4 cm secondary to severe graft degeneration  2.  55 cm palindrome catheter left femoral approach tip in right atrium  Operative details: After obtaining informed consent, the patient was taken to the operating room.  The patient was placed in supine position operating table.  An axillary block had been placed by the anesthesia team.  The patient is 87 years old and has severe contracture of her shoulder which limited abduction of the arm and exposure of her AV graft.  We were only able to get about 15 degrees of abduction of the arm.  This was prepped and draped in usual sterile fashion.  Next a an elliptical incision was made over an ulcerated area on the venous limb of the graft which was on the radial aspect of the arm.  Incision was carried down to the level of the graft which was severely degenerated.  He did have flow within it.  I dissected the graft out circumferentially above an area of degeneration and below it.  The skin was excised throughout the ellipse.  Patient was given 5000s of intravenous heparin.  The graft was clamped proximally and distally.  It was then transected in the central segment.  There was brisk arterial inflow on assessment of this.  There was some venous backbleeding.  I tried to pass a Fogarty catheter up the venous limb of the graft and it was severely degenerated and the Fogarty catheter would not pass much more than about 2 cm of the graft.  At this point I  decided that the graft could not be salvaged.  The patient had previously been admitted for advised up to the upper arm and due to her anatomic constraints I did not think this would be very practical or durable.  At this point the segment of degenerated graft was excised and the arterial limb oversewn with a running 5-0 Prolene suture right at the apex of the graft.  The venous limb of the graft was then oversewn with a running 5-0 Prolene suture as well.  Cautery was used to obtain hemostasis in the soft tissues.  Subcutaneous tissues were reapproximated using a running 3-0 Vicryl suture.  The skin was closed with interrupted 3-0 nylon sutures.  Xeroform 4 x 4's Kerlix and Ace wrap were applied to the arm.  At this point her neck was inspected to see if it would be practical to place a tunneled dialysis catheter at the neck level.  Due to her severe kyphosis and her dementia I did not think this was going to be a safe approach.  Therefore we prepped her in the groins on both sides.  Local anesthesia was infiltrated of the right common femoral vein.  Ultrasound was used to identify the right common femoral vein and micropuncture needle was used to cannulate this.  However, the micropuncture wire would not advance easily.  The vein on the right side was fairly flat in caliber.  I aborted attempts on the right and then turned my attention to the  left side.  The vein was of much better quality and more dilated.  Local anesthesia was infiltrated over this and a micropuncture needle used to successfully cannulate this.  The micropuncture wire was advanced into the iliac system.  Micropuncture sheath was then placed over this and this was all then swapped out for an 035 J-wire.  The J-wire traversed all the way up to the level of the right atrium.  At this point a 55 cm palindrome catheter was brought up in the operative field sequential 04/22/2015 [place] dilators with peel-away sheath were placed over the guidewire  into the left iliac system.  There was some resistance suggestive of possible narrowing of the left iliac system but I was able to get the dilators to pass fairly easily.  Due to this though I left the wire in place and the peel-away sheath and a 55 cm palindrome catheter was tunneled subcutaneously on the anterior thigh up to the peel-away sheath and then advanced over the J-wire to the right atrium.  At the level of the right atrium the guidewire had gone out what appeared to be a phrenic vein.  I pulled this back and this required the catheter be pulled back down into the IVC.  At this point I had to use an 035 angled Glidewire to advance the catheter back up into the right atrium due to some tortuosity at the caval atrial junction.  I then performed a contrast angiogram through the catheter to confirm that we are in good position.  This confirmed we were in the right atrium and overall the tip of the catheter seem to be in a good position.  The catheter was sutured the skin with nylon sutures.  The insertion site was closed with a Vicryl stitch.  The catheter was loaded with concentrated heparin solution.  Dermabond was applied to the skin insertion site.  An Ace wrap was placed over the exit site of the catheter to prevent the patient from grabbing the catheter and dislodging it.  The patient tolerated procedure well and there were no complications.  The incident sponge needle count was correct the end of the case.  The patient was taken to recovery in stable condition.  Ruta Hinds, MD [facility] [place] Office: [phone]

## 2020-11-10 NOTE — Progress Notes (Signed)
   11/10/20 1624  Assess: MEWS Score  Temp (!) 97.5 F (36.4 C)  BP (!) 55/35  Pulse Rate 71  Resp 18  Level of Consciousness Alert  SpO2 100 %  O2 Device Room Air  Assess: MEWS Score  MEWS Temp 0  MEWS Systolic 3  MEWS Pulse 0  MEWS RR 0  MEWS LOC 0  MEWS Score 3  MEWS Score Color Yellow  Assess: if the MEWS score is Yellow or Red  Were vital signs taken at a resting state? No  Focused Assessment Change from prior assessment (see assessment flowsheet)  Early Detection of Sepsis Score *See Row Information* Low  MEWS guidelines implemented *See Row Information* Yes  Take Vital Signs  Increase Vital Sign Frequency  Yellow: Q 2hr X 2 then Q 4hr X 2, if remains yellow, continue Q 4hrs  Escalate  MEWS: Escalate Yellow: discuss with charge nurse/RN and consider discussing with provider and RRT  Notify: Charge Nurse/RN  Name of Charge Nurse/RN Notified Alden Server  Date Charge Nurse/RN Notified 11/10/20  Time Charge Nurse/RN Notified 1630  Notify: Provider  Provider Name/Title Eulogio Bear  Date Provider Notified 11/10/20  Time Provider Notified 1645  Notification Type Page (text)  Notification Reason Change in status  Provider response See new orders  Date of Provider Response 11/10/20  Time of Provider Response [phone]

## 2020-11-11 ENCOUNTER — Encounter (HOSPITAL_COMMUNITY): Payer: Self-pay | Admitting: Vascular Surgery

## 2020-11-11 DIAGNOSIS — T82838A Hemorrhage of vascular prosthetic devices, implants and grafts, initial encounter: Secondary | ICD-10-CM | POA: Diagnosis not present

## 2020-11-11 LAB — CBC
HCT: 28.6 % — ABNORMAL LOW (ref 36.0–46.0)
Hemoglobin: 8.9 g/dL — ABNORMAL LOW (ref 12.0–15.0)
MCH: 29.8 pg (ref 26.0–34.0)
MCHC: 31.1 g/dL (ref 30.0–36.0)
MCV: 95.7 fL (ref 80.0–100.0)
Platelets: 94 10*3/uL — ABNORMAL LOW (ref 150–400)
RBC: 2.99 MIL/uL — ABNORMAL LOW (ref 3.87–5.11)
RDW: 15 % (ref 11.5–15.5)
WBC: 7.9 10*3/uL (ref 4.0–10.5)
nRBC: 0 % (ref 0.0–0.2)

## 2020-11-11 LAB — RENAL FUNCTION PANEL
Albumin: 2.7 g/dL — ABNORMAL LOW (ref 3.5–5.0)
Anion gap: 14 (ref 5–15)
BUN: 44 mg/dL — ABNORMAL HIGH (ref 8–23)
CO2: 27 mmol/L (ref 22–32)
Calcium: 8.9 mg/dL (ref 8.9–10.3)
Chloride: 95 mmol/L — ABNORMAL LOW (ref 98–111)
Creatinine, Ser: 6.68 mg/dL — ABNORMAL HIGH (ref 0.44–1.00)
GFR, Estimated: 5 mL/min — ABNORMAL LOW (ref >60)
Glucose, Bld: 91 mg/dL (ref 70–99)
Phosphorus: 7.7 mg/dL — ABNORMAL HIGH (ref 2.5–4.6)
Potassium: 3.1 mmol/L — ABNORMAL LOW (ref 3.5–5.1)
Sodium: 136 mmol/L (ref 135–145)

## 2020-11-11 LAB — PROTIME-INR
INR: 1.4 — ABNORMAL HIGH (ref 0.8–1.2)
Prothrombin Time: 17.4 seconds — ABNORMAL HIGH (ref 11.4–15.2)

## 2020-11-11 MED ORDER — SODIUM CHLORIDE 0.9 % IV SOLN: 100.0000 mL | INTRAVENOUS | Status: DC | PRN

## 2020-11-11 MED ORDER — LIDOCAINE HCL (PF) 1 % IJ SOLN: 5.0000 mL | INTRAMUSCULAR | Status: DC | PRN

## 2020-11-11 MED ORDER — ALBUMIN HUMAN 25 % IV SOLN
INTRAVENOUS | Status: AC
  Administered 2020-11-11: 25 g via INTRAVENOUS
  Filled 2020-11-11: qty 100

## 2020-11-11 MED ORDER — HEPARIN SODIUM (PORCINE) 1000 UNIT/ML DIALYSIS: 1000.0000 [IU] | INTRAMUSCULAR | Status: DC | PRN

## 2020-11-11 MED ORDER — HEPARIN SODIUM (PORCINE) 1000 UNIT/ML IJ SOLN
INTRAMUSCULAR | Status: AC
  Administered 2020-11-11: 1000 [IU] via INTRAVENOUS_CENTRAL
  Filled 2020-11-11: qty 4

## 2020-11-11 MED ORDER — MIDODRINE HCL 5 MG PO TABS
ORAL_TABLET | ORAL | Status: AC
  Filled 2020-11-11: qty 2

## 2020-11-11 MED ORDER — DARBEPOETIN ALFA 25 MCG/0.42ML IJ SOSY
PREFILLED_SYRINGE | INTRAMUSCULAR | Status: AC
  Administered 2020-11-11: 25 ug via INTRAVENOUS
  Filled 2020-11-11: qty 0.42

## 2020-11-11 MED ORDER — HEPARIN SODIUM (PORCINE) 1000 UNIT/ML IJ SOLN
INTRAMUSCULAR | Status: AC
  Administered 2020-11-11: 1000 [IU] via INTRAVENOUS_CENTRAL
  Filled 2020-11-11: qty 3

## 2020-11-11 MED ORDER — DARBEPOETIN ALFA 25 MCG/0.42ML IJ SOSY: 25.0000 ug | PREFILLED_SYRINGE | Freq: Once | INTRAMUSCULAR | Status: AC

## 2020-11-11 MED ORDER — ALBUMIN HUMAN 25 % IV SOLN: 25.0000 g | Freq: Once | INTRAVENOUS | Status: AC

## 2020-11-11 MED ORDER — PENTAFLUOROPROP-TETRAFLUOROETH EX AERO: 1.0000 "application " | INHALATION_SPRAY | CUTANEOUS | Status: DC | PRN

## 2020-11-11 MED ORDER — LIDOCAINE-PRILOCAINE 2.5-2.5 % EX CREA: 1.0000 "application " | TOPICAL_CREAM | CUTANEOUS | Status: DC | PRN

## 2020-11-11 NOTE — Hospital Course (Signed)
87 year old community dwelling female Left 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  IgA myeloma ESRD [place]TS [place]--access complications and issues since 05/28/2020 Rx balloon angioplasty Aortic stenosis from prior nonsurgical candidate), paroxysmal A. fib CHADS2 score >4 since 08/2016 (Coumadin) with TIA in the past HFpEF Anemia renal insufficiency Prior vasovagal syncope resulting in admission in 2018 Prior liver cysts noted in 2018  Admit 7/4 spontaneous bleeding dialysis AV graft-INR 4 7/4 vascular consulted 7/5 excision/ligation right forearm AV graft insertion left femoral tunneled dialysis cath

## 2020-11-11 NOTE — Progress Notes (Signed)
Hemodialysis- Patient continues to have persistent hypotension despite saline bolus 250cc x2, uf off, and midodrine administration. Notified renal PA. Order placed for albumin. Done.

## 2020-11-11 NOTE — Progress Notes (Signed)
Assessment Done at 19:45 pm.

## 2020-11-11 NOTE — Progress Notes (Addendum)
  Progress Note    11/11/2020 6:57 AM 1 Day Post-Op  Subjective:  Complaining of incisional pain right forearm   Vitals:   11/10/20 2119 11/11/20 0503  BP: (!) 95/34 (!) 97/31  Pulse: 63 61  Resp:  15  Temp:  98.4 F (36.9 C)  SpO2:  94%    Physical Exam:  General appearance: awake, alert in NAD Left groin: catheter site without bleeding or erythema. Dressing dry and intact. Respirations: unlabored; no dyspnea at rest Right upper extremity: Surgical dressing removed. Xeroform in place over nylon sutures. No bleeding or hematoma. Hand is warm. LLE: 2+ left DP pulse    CBC    Component Value Date/Time   WBC 7.9 11/11/2020 0500   RBC 2.99 (L) 11/11/2020 0500   HGB 8.9 (L) 11/11/2020 0500   HCT 28.6 (L) 11/11/2020 0500   PLT 94 (L) 11/11/2020 0500   MCV 95.7 11/11/2020 0500   MCH 29.8 11/11/2020 0500   MCHC 31.1 11/11/2020 0500   RDW 15.0 11/11/2020 0500   LYMPHSABS 1.9 11/09/2020 0921   MONOABS 0.5 11/09/2020 0921   EOSABS 0.2 11/09/2020 0921   BASOSABS 0.0 11/09/2020 0921    BMET    Component Value Date/Time   NA 139 11/09/2020 0921   K 4.1 11/09/2020 0921   CL 98 11/09/2020 0921   CO2 27 11/09/2020 0921   GLUCOSE 80 11/09/2020 0921   BUN 23 11/09/2020 0921   CREATININE 4.40 (H) 11/09/2020 0921   CALCIUM 9.1 11/09/2020 0921   GFRNONAA 9 (L) 11/09/2020 0921   GFRAA 25 (L) 02/28/2019 1559     Intake/Output Summary (Last 24 hours) at 11/11/2020 0657 Last data filed at 11/11/2020 0503 Gross per 24 hour  Intake 250 ml  Output 50 ml  Net 200 ml    HOSPITAL MEDICATIONS Scheduled Meds:  amiodarone  100 mg Oral QHS   calcitonin (salmon)  1 spray Alternating Nares Daily   Chlorhexidine Gluconate Cloth  6 each Topical Q0600   cinacalcet  30 mg Oral Q breakfast   lidocaine-prilocaine  1 application Topical Once per day on Mon Wed Fri   midodrine  10 mg Oral TID WC   multivitamin  1 tablet Oral Weekly   sodium chloride flush  3 mL Intravenous Q12H    Continuous Infusions:  sodium chloride     sodium chloride      ceFAZolin (ANCEF) IV     PRN Meds:.sodium chloride, sodium chloride, acetaminophen **OR** acetaminophen, albuterol, heparin, lidocaine (PF), lidocaine-prilocaine, pentafluoroprop-tetrafluoroeth  Assessment and Plan:  POD 1 excision of right forearm AVG and placement of left groin TDC> no evidence of ongoing bleeding or hematoma. Hgb 8.9  For HD this morning.  Risa Grill, PA-C [facility] [phone] 11/11/2020  6:57 AM   Agree with above.   Ok to resume heparin today. If primary team believes she is long term candidate for oral anticoagulation this can be resumed tomorrow  Ruta Hinds, MD [facility] [place] Office: [phone]

## 2020-11-11 NOTE — Plan of Care (Signed)
  Problem: Education: Goal: Knowledge of General Education information will improve Description: Including pain rating scale, medication(s)/side effects and non-pharmacologic comfort measures Outcome: Progressing   Problem: Safety: Goal: Ability to remain free from injury will improve Outcome: Progressing   Problem: Skin Integrity: Goal: Risk for impaired skin integrity will decrease Outcome: Progressing   

## 2020-11-11 NOTE — Progress Notes (Signed)
BP remains low on HD - SBP 70s. She is asymptomatic. No UF. Midodrine 10mg  given, and has had four 259mL NS boluses, BP will go to 90s after then slowly fall back to 70s.  Will give dose IV albumin to see if perks. If not, will likely stop her HD early.  Dialysis RN reports that [name] indicated that she is considering stopping dialysis permanently and proceeding with hospice. Would recommend palliative care consult.  Veneta Penton, PA-C [employer] Pager [phone]

## 2020-11-11 NOTE — Progress Notes (Signed)
PROGRESS NOTE   [name]  UXL:[id number] DOB: 1921-06-03 DOA: 11/09/2020 PCP: Nephrology  Brief Narrative:   87 year old community dwelling female Left 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  IgA myel[place]--access complications and issues since 05/28/2020 Rx balloon angioplasty Aortic stenosis from prior nonsurgical candidate), paroxysmal A. fib CHADS2 score >6 since 08/2016 (Coumadin) with TIA in the past HFpEF Anemia renal insufficiency Prior vasovagal syncope resulting in admission in 2018 Prior liver cysts noted in 2018  Admit 7/4 spontaneous bleeding dialysis AV graft-INR 4 7/4 vascular consulted 7/5 excision/ligation right forearm AV graft insertion left femoral tunneled dialysis cath  Hospital-Problem based course  Spontaneous bleeding from right sided AV graft Appreciate vascular surgery input-planning as per them [place]/left nephrectomy 1970s Blood pressure is chronically low-continue midodrine 10 3 times daily--given albumin in HD Currently has left femoral access  Outpatient discussion regarding given her age, risk of further interventions etc. with PCP-I will CC Dr. Mariea Clonts Continue Sensipar 30 a.m., calcitonin alternate nares Rest as per renal Paroxysmal A. fib CHADS2 score >6, has bled score >4 ? Prior TIA--unclear when Discussed with son Juanda Crumble implications of resumption of Coumadin Continue amiodarone 100 at bedtime Mild thrombocytopenia History of IgA myeloma? Unlikely candidate for interventions for myeloma Check iron studies today and replete IV iron if needed Acute blood loss anemia on admission As above-does not meet transfusion threshold Aortic stenosis-moderate based on 04/26/2017 echocardiogram and grade 1 diastolic dysfunction Follow-up with cardiology as needed-unlikely candidate for further interventions   DVT prophylaxis: IV heparin Code Status: DNR Family Communication:  called son Juanda Crumble [phone] Lives with  son for over 1 year--she has been passing out at home, he understands the nature of her illnesses--she gets around in a [facility] and is in a [place] and sleeps in recliner for the past 2 years He asks about whether we should stop HD I have explained that we can continue these discussions Disposition:  Status is: Inpatient  Remains inpatient appropriate because:Hemodynamically unstable and IV treatments appropriate due to intensity of illness or inability to take PO  Dispo: The patient is from: Home              Anticipated d/c is to: SNF              Patient currently is not medically stable to d/c.   Difficult to place patient Yes  Consultants:  Renal\ Vascular  Procedures:   7/5-ligation per vascular  Antimicrobials:   None currently   Subjective:  Patient seen on HD unit Uncomfortable in her neck-denies right arm pain No chest pain She is quite coherent-tells me she has been living with [facility] for over a year-she understands that they removed the graft yesterday and I had a long discussion with her about the implications of having a leg graft and that we may run out of options eventually "I want to go"-when I asked her to clarify she tells me she wants to peacefully die she has thought about hospice in the past   Objective: Vitals:   11/11/20 0830 11/11/20 0900 11/11/20 0915 11/11/20 0919  BP: (!) 85/36 (!) 79/63 (!) 68/31 (!) 90/35  Pulse: 61 (!) 58    Resp: 10 14    Temp:      TempSrc:      SpO2:      Weight:        Intake/Output Summary (Last 24 hours) at 11/11/2020 0953 Last data filed at 11/11/2020 4259 Gross per 24  hour  Intake 370 ml  Output 50 ml  Net 320 ml   Filed Weights   11/11/20 0805  Weight: 48.6 kg    Examination:  Awake no icterus no pallor bitemporal wasting chest clear no rales no rhonchi Dialyzing through left femoral catheter O1-Y2 holosystolic murmur Abdomen soft no rebound no guarding Neurologically intact moving upper extremities left  lower extremity seems weak Smile is symmetric  Data Reviewed: personally reviewed   CBC    Component Value Date/Time   WBC 7.9 11/11/2020 0500   RBC 2.99 (L) 11/11/2020 0500   HGB 8.9 (L) 11/11/2020 0500   HCT 28.6 (L) 11/11/2020 0500   PLT 94 (L) 11/11/2020 0500   MCV 95.7 11/11/2020 0500   MCH 29.8 11/11/2020 0500   MCHC 31.1 11/11/2020 0500   RDW 15.0 11/11/2020 0500   LYMPHSABS 1.9 11/09/2020 0921   MONOABS 0.5 11/09/2020 0921   EOSABS 0.2 11/09/2020 0921   BASOSABS 0.0 11/09/2020 0921   CMP Latest Ref Rng & Units 11/09/2020 07/03/2020 03/15/2019  Glucose 70 - 99 mg/dL 80 - 83  BUN 8 - 23 mg/dL 23 - 22  Creatinine 0.44 - 1.00 mg/dL 4.40(H) - 4.00(H)  Sodium 135 - 145 mmol/L 139 - 134(L)  Potassium 3.5 - 5.1 mmol/L 4.1 - 3.8  Chloride 98 - 111 mmol/L 98 - 93(L)  CO2 22 - 32 mmol/L 27 - -  Calcium 8.9 - 10.3 mg/dL 9.1 - -  Total Protein 6.0 - 8.5 g/dL - 6.4 -  Total Bilirubin 0.0 - 1.2 mg/dL - 0.3 -  Alkaline Phos 44 - 121 IU/L - 88 -  AST 0 - 40 IU/L - 18 -  ALT 0 - 32 IU/L - 11 -     Radiology Studies: DG Chest Port 1 View  Result Date: 11/10/2020 CLINICAL DATA:  Status post dialysis catheter insertion. EXAM: PORTABLE CHEST 1 VIEW COMPARISON:  February 26, 2019. FINDINGS: The heart size and mediastinal contours are within normal limits. Both lungs are clear. Interval placement of dialysis catheter from the inferior vena cava, with the distal tip in expected position of the SVC. The visualized skeletal structures are unremarkable. IMPRESSION: Interval placement of dialysis catheter from the inferior vena cava, with distal tip in the expected position of the SVC. Aortic Atherosclerosis (ICD10-I70.0). Electronically Signed   By: Marijo Conception M.D.   On: 11/10/2020 15:56   DG Fluoro Guide CV Line Left  Result Date: 11/10/2020 CLINICAL DATA:  87 year old female with reported placement of a tunneled hemodialysis catheter in the operating room EXAM: CHEST FLUOROSCOPY TECHNIQUE:  Real-time fluoroscopic evaluation of the chest was performed. FLUOROSCOPY TIME:  Fluoroscopy Time: Op note COMPARISON:  None. FINDINGS: Limited intraoperative fluoroscopic image of the chest. Partial opacification/visualization of the pulmonary arteries. No HD catheter visualized. IMPRESSION: Limited intraoperative fluoroscopic spot images, as above. Please refer to the dictated operative report for full details of intraoperative findings and procedure. Electronically Signed   By: Corrie Mckusick D.O.   On: 11/10/2020 15:21     Scheduled Meds:  amiodarone  100 mg Oral QHS   calcitonin (salmon)  1 spray Alternating Nares Daily   Chlorhexidine Gluconate Cloth  6 each Topical Q0600   cinacalcet  30 mg Oral Q breakfast   heparin sodium (porcine)       lidocaine-prilocaine  1 application Topical Once per day on Mon Wed Fri   midodrine       midodrine  10 mg Oral TID WC  multivitamin  1 tablet Oral Weekly   sodium chloride flush  3 mL Intravenous Q12H   Continuous Infusions:  sodium chloride     sodium chloride       LOS: 1 day   Time spent: [place], MD [facility] To contact the attending provider between 7A-7P or the covering provider during after hours 7P-7A, please log into the web site [email or web address] and access using universal [place] password for that web site. If you do not have the password, please call the hospital operator.  11/11/2020, 9:53 AM

## 2020-11-11 NOTE — Progress Notes (Signed)
[facility] Progress Note   Subjective:  Seen on HD. Hypotensive for now, looks like didn't get midodrine yet - will give. Sstarting with zero UFG and ^ if possible. She is much more talkative today (baseline), c/o pain in R arm.  Objective Vitals:   11/10/20 1829 11/10/20 2000 11/10/20 2119 11/11/20 0503  BP: (!) 93/36 (!) 94/34 (!) 95/34 (!) 97/31  Pulse: 63 60 63 61  Resp:  14  15  Temp:    98.4 F (36.9 C)  TempSrc:      SpO2:    94%   Physical Exam General: Frail, elderly woman, NAD. Room air.  Heart: RRR; 3/6 murmur Lungs: CTA anteriorly Abdomen: soft Extremities: 1+ BLE edema; R forearm bandaged s/p AVG ligation Dialysis Access: New L femoral TDC  Additional Objective Labs: Basic Metabolic Panel: Recent Labs  Lab 11/09/20 0921  NA 139  K 4.1  CL 98  CO2 27  GLUCOSE 80  BUN 23  CREATININE 4.40*  CALCIUM 9.1    CBC: Recent Labs  Lab 11/09/20 0921 11/09/20 1557 11/10/20 0327 11/11/20 0500  WBC 5.1  --  6.2 7.9  NEUTROABS 2.5  --   --   --   HGB 11.6* 11.3* 11.3* 8.9*  HCT 38.4 36.3 36.1 28.6*  MCV 100.8*  --  96.8 95.7  PLT 141*  --  99* 94*   Studies/Results: DG Chest Port 1 View  Result Date: 11/10/2020 CLINICAL DATA:  Status post dialysis catheter insertion. EXAM: PORTABLE CHEST 1 VIEW COMPARISON:  February 26, 2019. FINDINGS: The heart size and mediastinal contours are within normal limits. Both lungs are clear. Interval placement of dialysis catheter from the inferior vena cava, with the distal tip in expected position of the SVC. The visualized skeletal structures are unremarkable. IMPRESSION: Interval placement of dialysis catheter from the inferior vena cava, with distal tip in the expected position of the SVC. Aortic Atherosclerosis (ICD10-I70.0). Electronically Signed   By: Marijo Conception M.D.   On: 11/10/2020 15:56   DG Fluoro Guide CV Line Left  Result Date: 11/10/2020 CLINICAL DATA:  87 year old female with reported  placement of a tunneled hemodialysis catheter in the operating room EXAM: CHEST FLUOROSCOPY TECHNIQUE: Real-time fluoroscopic evaluation of the chest was performed. FLUOROSCOPY TIME:  Fluoroscopy Time: Op note COMPARISON:  None. FINDINGS: Limited intraoperative fluoroscopic image of the chest. Partial opacification/visualization of the pulmonary arteries. No HD catheter visualized. IMPRESSION: Limited intraoperative fluoroscopic spot images, as above. Please refer to the dictated operative report for full details of intraoperative findings and procedure. Electronically Signed   By: Corrie Mckusick D.O.   On: 11/10/2020 15:21    Medications:  sodium chloride     sodium chloride      amiodarone  100 mg Oral QHS   calcitonin (salmon)  1 spray Alternating Nares Daily   Chlorhexidine Gluconate Cloth  6 each Topical Q0600   cinacalcet  30 mg Oral Q breakfast   lidocaine-prilocaine  1 application Topical Once per day on Mon Wed Fri   midodrine  10 mg Oral TID WC   multivitamin  1 tablet Oral Weekly   sodium chloride flush  3 mL Intravenous Q12H    Dialysis Orders: [facility] 3:30hr, 350/500, EDW 46.5kg, 3K/2Ca, AVG, UFP #4, heparin 2300 bolus - Hectoral 29mcg IV q HD - no ESA   Assessment/Plan:  AVG hemorrhage: INR high on admit, still with bleeding despite reversal. S/p AVG ligation and new L femoral TDC  placement - will be cath dependent.  ESRD:  Usual TTS schedule, rolled over to this morning d/t hypotension after surgery. BP still low sided, no UF to start. Giving midodrine now.  Hypotension/volume: BP low, on mido 10 TID - giving now.   Anemia of ESRD: Hgb down to 8.9 (post-op drop). Will give dose Aranesp today.  Metabolic bone disease: Ca ok, Phos pending. Continue home meds.  A-fib: On warfarin as OP, supra-therapeutic on admit. Discussed with patient today that doubt she is getting any protective benefit from the warfarin at age 58 and CERTAINLY is high risk to remain on it. I would favor  discontinuing it.    Veneta Penton, PA-C 11/11/2020, 8:29 AM  [employer]

## 2020-11-11 NOTE — TOC Progression Note (Signed)
Transition of Care [facility]) - Progression Note    Patient Details  Name: SABRINNA YEARWOOD MRN: [id number] Date of Birth: [birth year]  [facility] CM/SW Contact  Sharlet Salina Mila Homer, LCSW Phone Number: 11/11/2020, 5:26 PM  Clinical Narrative:  Attending MD requested that CSW talk with patient and family regarding hospice services as patient wants to stop dialysis.  Talked with patient, her son Terren Haberle and daughter-in-law Dorice Stiggers (wife of another son). Ms. Barretto reported that she wants to stop dialysis as she can't do anything for herself anymore. She added that she has a catheter in her thigh that is temporary. When asked, CSW informed that her son Juanda Crumble helps her out at home and takes her to dialysis and she has an aide that comes 3 days per week for 2 hours a day. Patient discharging home was discussed in terms of son providing care when the aide is not there. Hospice services discussed and patient/family informed that referral can be done and hospice services at home and [facility] discussed.  Family indicated that they want to talk more with nephrology and will follow-up with CSW tomorrow. CSW's card was given to daughter-in-law.           Expected Discharge Plan and [place] with hospice services discussed                                             Social Determinants of Health (SDOH) Interventions    Readmission Risk Interventions No flowsheet data found.

## 2020-11-11 NOTE — Progress Notes (Signed)
Hemodialysis- Treatment completed without issue. Unable to uf due to low bps throughout treatment. Given 500cc saline bolus and 25g iv albumin for bp support per renal PA. Patient was able to complete 3.5 hour treatment with above interventions. Report called to 55M.

## 2020-11-12 ENCOUNTER — Telehealth: Payer: Self-pay

## 2020-11-12 DIAGNOSIS — T82838A Hemorrhage of vascular prosthetic devices, implants and grafts, initial encounter: Secondary | ICD-10-CM | POA: Diagnosis not present

## 2020-11-12 LAB — CBC
HCT: 25.1 % — ABNORMAL LOW (ref 36.0–46.0)
Hemoglobin: 7.9 g/dL — ABNORMAL LOW (ref 12.0–15.0)
MCH: 30.4 pg (ref 26.0–34.0)
MCHC: 31.5 g/dL (ref 30.0–36.0)
MCV: 96.5 fL (ref 80.0–100.0)
Platelets: 114 10*3/uL — ABNORMAL LOW (ref 150–400)
RBC: 2.6 MIL/uL — ABNORMAL LOW (ref 3.87–5.11)
RDW: 15.3 % (ref 11.5–15.5)
WBC: 7 10*3/uL (ref 4.0–10.5)
nRBC: 0 % (ref 0.0–0.2)

## 2020-11-12 LAB — RENAL FUNCTION PANEL
Albumin: 2.9 g/dL — ABNORMAL LOW (ref 3.5–5.0)
Anion gap: 12 (ref 5–15)
BUN: 29 mg/dL — ABNORMAL HIGH (ref 8–23)
CO2: 26 mmol/L (ref 22–32)
Calcium: 8.6 mg/dL — ABNORMAL LOW (ref 8.9–10.3)
Chloride: 97 mmol/L — ABNORMAL LOW (ref 98–111)
Creatinine, Ser: 4.02 mg/dL — ABNORMAL HIGH (ref 0.44–1.00)
GFR, Estimated: 10 mL/min — ABNORMAL LOW (ref >60)
Glucose, Bld: 79 mg/dL (ref 70–99)
Phosphorus: 5.5 mg/dL — ABNORMAL HIGH (ref 2.5–4.6)
Potassium: 3 mmol/L — ABNORMAL LOW (ref 3.5–5.1)
Sodium: 135 mmol/L (ref 135–145)

## 2020-11-12 LAB — PROTIME-INR
INR: 1.5 — ABNORMAL HIGH (ref 0.8–1.2)
Prothrombin Time: 17.8 seconds — ABNORMAL HIGH (ref 11.4–15.2)

## 2020-11-12 LAB — RETICULOCYTES
Immature Retic Fract: 18.8 % — ABNORMAL HIGH (ref 2.3–15.9)
RBC.: 2.53 MIL/uL — ABNORMAL LOW (ref 3.87–5.11)
Retic Count, Absolute: 36.4 10*3/uL (ref 19.0–186.0)
Retic Ct Pct: 1.4 % (ref 0.4–3.1)

## 2020-11-12 LAB — FERRITIN: Ferritin: 1306 ng/mL — ABNORMAL HIGH (ref 11–307)

## 2020-11-12 LAB — IRON AND TIBC
Iron: 26 ug/dL — ABNORMAL LOW (ref 28–170)
Saturation Ratios: 19 % (ref 10.4–31.8)
TIBC: 134 ug/dL — ABNORMAL LOW (ref 250–450)
UIBC: 108 ug/dL

## 2020-11-12 LAB — FOLATE: Folate: 9.5 ng/mL (ref >5.9)

## 2020-11-12 LAB — VITAMIN B12: Vitamin B-12: 963 pg/mL — ABNORMAL HIGH (ref 180–914)

## 2020-11-12 MED ORDER — HEPARIN SODIUM (PORCINE) 1000 UNIT/ML IJ SOLN
INTRAMUSCULAR | Status: AC
  Administered 2020-11-12: 6200 [IU]
  Filled 2020-11-12: qty 6

## 2020-11-12 MED ORDER — ALBUMIN HUMAN 25 % IV SOLN: 25.0000 g | Freq: Once | INTRAVENOUS | Status: AC

## 2020-11-12 MED ORDER — MIDODRINE HCL 5 MG PO TABS
ORAL_TABLET | ORAL | Status: AC
  Filled 2020-11-12: qty 2

## 2020-11-12 MED ORDER — ALBUMIN HUMAN 25 % IV SOLN
INTRAVENOUS | Status: AC
  Administered 2020-11-12: 25 g via INTRAVENOUS
  Filled 2020-11-12: qty 100

## 2020-11-12 NOTE — Progress Notes (Signed)
Renal Navigator was asked by Renal PA/K. Stovall to join conversation with her, patient, patient's son and her daughter-in-law. All were pleasant, easy to engage, and equally joined in the conversation about whether patient would like to stop dialysis and focus on comfort at this time, or continue dialysis and discharge home. Patient needed clarification regarding her new catheter, which PA explained. Patient understands now that she can discharge with her catheter and use this in the clinic for as long as needed. We discussed quality of life and patient feels she still has enjoyment. We talked about quality of life as a good indicator of continuing vs stopping dialysis. Navigator suggests referral to outpatient palliative care to provide support to family if patient decides to continue HD and explained that this team can transition her to hospice care at any time, should she decide she no longer wants to do dialysis. Navigator explained that this transition could take place in her home if she has that kind of care/support from family, or a transfer to [employer] [facility] for residential hospice could happen from home at that time. Patient wanted clarification if a transition to hospice could happen now as well. Navigator explained that if patient chose to stop dialysis now, in the hospital, and focus solely on comfort measures, we could transfer her to [facility] when a bed is available, though Navigator cannot tell her when a bed would be available. Navigator told patient that this is an option and can involve the unit Education officer, museum if this is what her decision is. Patient said she would like to "go home." She states she would like to continue with dialysis for now and would like to have Palliative Care follow with her and her family in the home. Both her son and her daughter-in-law state they have worked with [employer] in the recent past (daughter-in-law's husband/patient's son and son's daughter have  passed within the last year) and they would like a referral made to this agency again. Navigator thanked the family for discussing this topic with PA and Navigator today and asked if they feel their questions and concerns have been addressed. They said they did and feel good about the plan. Patient added that in addition to quality of life, she feels that if her catheter malfunctions, that will probably be a reason to stop dialysis at that time.  We will plan for HD in the hospital today, though it will probably be late due to high patient volume. She can discharge after or wait until tomorrow. Patient's son noted, "she is wiped out after dialysis" and feels like discharge in the morning would be better for her. PA updated Attending and Nephrologist. Navigator requested referral to [facility] for outpatient Palliative follow up by TOC CM.  Alphonzo Cruise, [place] Renal Navigator [phone]

## 2020-11-12 NOTE — TOC Initial Note (Signed)
Transition of Care [facility]) - Initial/Assessment Note    Patient Details  Name: [name] MRN: [id number] Date of Birth: 1922-01-28  Transition of Care [facility]) CM/SW Contact:    Verdell Carmine, RN Phone Number: 11/12/2020, 1:27 PM  Clinical Narrative:                  87 year old admitted, wantss to discontinue dialysis, family meeting yesterday. Received message from Warner Mccreedy renal Csw that patient wishes to go home with outpatient palliative services. They have used [facility] in past. Called and LM for [facility] concerning OP palliative services. If stops dialysis would likely transition to hospice.   Expected Discharge Plan:  (outpatient palliative) Barriers to Discharge: Continued Medical Work up   Patient Goals and CMS Choice        Expected Discharge Plan and Services Expected Discharge Plan:  (outpatient palliative)       Living arrangements for the past 2 months: Single Family Home                                      Prior Living Arrangements/Services Living arrangements for the past 2 months: Single Family Home Lives with:: Adult Children Patient language and need for interpreter reviewed:: Yes        Need for Family Participation in Patient Care: Yes (Comment) Care giver support system in place?: Yes (comment)   Criminal Activity/Legal Involvement Pertinent to Current Situation/Hospitalization: No - Comment as needed  Activities of Daily Living      Permission Sought/Granted                  Emotional Assessment         Alcohol / Substance Use: Not Applicable Psych Involvement: No (comment)  Admission diagnosis:  Coagulopathy ([place]) [D68.9] Bleeding due to dialysis catheter placement, initial encounter ([place]) [T82.838A] Bleeding from dialysis shunt, initial encounter ([place]) [H37.169C] Bleeding [R58] Patient Active Problem List   Diagnosis Date Noted   Bleeding from dialysis shunt, initial encounter ([place]) 11/09/2020    Macrocytic anemia 11/09/2020   Thrombocytopenia ([place]) 10/28/2019   A-V fistula ([place]) 05/20/2019   Nonrheumatic aortic valve stenosis 03/16/2019   Pulmonary HTN ([place]) 03/16/2019   RBBB 03/16/2019   Educated about COVID-19 virus infection 09/21/2018   Syncope 04/25/2017   Gastritis 04/25/2017   Closed fracture of one rib of right side    Hypotension 02/10/2017   Lactic acidosis 02/10/2017   Bradycardia 10/29/2016   Atypical chest pain 09/27/2016   Essential hypertension 09/27/2016   Other chest pain    Coumadin Rx 09/07/2016   Monitoring for long-term anticoagulant use 08/29/2016   Atrial flutter ([place])    PAF (paroxysmal atrial fibrillation) ([place]) 08/22/2016   Atrial fibrillation with RVR ([place]) 08/20/2016   Severe aortic stenosis 08/20/2016   New onset a-fib ([place]) 08/20/2016   Staphylococcus aureus bacteremia    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

## 2020-11-12 NOTE — Telephone Encounter (Signed)
Outgoing fax to [facility] for inr lab draw

## 2020-11-12 NOTE — Progress Notes (Addendum)
PROGRESS NOTE   [name]  TDH:[id number] DOB: 1921/12/01 DOA: 11/09/2020 PCP: Nephrology  Brief Narrative:   87 year old community dwelling female Left 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  IgA myeloma ESRD [place]--access complications and issues since 05/28/2020 Rx balloon angioplasty Aortic stenosis from prior nonsurgical candidate), paroxysmal A. fib CHADS2 score >6 since 08/2016 (Coumadin) with TIA in the past HFpEF Anemia renal insufficiency Prior vasovagal syncope resulting in admission in 2018 Prior liver cysts noted in 2018  Admit 7/4 spontaneous bleeding dialysis AV graft-INR 4 7/4 vascular consulted 7/5 excision/ligation right forearm AV graft insertion left femoral tunneled dialysis cath  Hospital-Problem based course  Spontaneous bleeding from right forearm AV graft status post ligation Dr. Oneida Alar of vascular Outpatient follow-up 2 weeks + suture removal-they have signed off ESRD [place]/left nephrectomy 1970s Blood pressure is chronically low-continue midodrine 10 3 times daily- Currently has left femoral access and dialyzing through this Continue Sensipar 30 a.m., calcitonin alternate nares Rest as per renal Paroxysmal A. fib CHADS2 score >6, has bled score >4 ? Prior TIA--unclear when Discussed with son Juanda Crumble implications of resumption of Coumadin shared decision making-discontinue Coumadin given short-term high risk of bleed balanced with only slight benefit of long-term complication prevention of stroke Continue amiodarone 100 at bedtime Mild thrombocytopenia History of IgA myeloma? Unlikely candidate for interventions for myeloma-aranesp given 7/6 Iron level 26, saturation ratios 19 however ferritin elevated so contraindicated to give IV iron at this time Acute blood loss anemia on admission As above-does not meet transfusion threshold Aortic stenosis-moderate based on 04/26/2017 echocardiogram and grade 1 diastolic dysfunction Follow-up with  cardiology as needed-unlikely candidate for further interventions   DVT prophylaxis: IV heparin transition to no anticoagulation at this time other than prophylactic heparin for DVT prophylaxis Code Status: DNR Family Communication:  In my note dated 7/6 I discussed with Juanda Crumble [phone] the patient's son Met him at the bedside 7/7-confirmed with patient that she still wants to continue dialysis short-term and will have outpatient discussions with nephrology Aim for discharge likely a.m. Disposition:  Status is: Inpatient  Remains inpatient appropriate because:Hemodynamically unstable and IV treatments appropriate due to intensity of illness or inability to take PO  Dispo: The patient is from: Home              Anticipated d/c is to: SNF              Patient currently is not medically stable to d/c.   Difficult to place patient Yes  Consultants:  Renal\ Vascular  Procedures:   7/5-ligation per vascular  Antimicrobials:   None currently   Subjective:   coherent pleasant no distress Eating minimally No pain in the right arm-dialysis yesterday uneventful-no chest pain   Objective: Vitals:   11/11/20 1800 11/11/20 1939 11/12/20 0550 11/12/20 0952  BP: (!) 95/35 (!) 107/35 122/60 (!) 101/32  Pulse: 63 64 91 64  Resp: 18   17  Temp: 98 F (36.7 C) 98.5 F (36.9 C) 98.4 F (36.9 C) (!) 97.4 F (36.3 C)  TempSrc: Oral Oral Oral Oral  SpO2: 100% 99% 100% 96%  Weight:        Intake/Output Summary (Last 24 hours) at 11/12/2020 1612 Last data filed at 11/12/2020 1300 Gross per 24 hour  Intake 360 ml  Output --  Net 360 ml    Filed Weights   11/11/20 0805 11/11/20 1144  Weight: 48.6 kg 49.6 kg    Examination:  Awake coherent pleasant bitemporal wasting S1-S2  holosystolic murmur Abdomen soft and Chest clear Right arm bandage seems clean Neurologically intact moving all 4 limbs  Data Reviewed: personally reviewed   CBC    Component Value Date/Time    WBC 7.9 11/11/2020 0500   RBC 2.53 (L) 11/12/2020 0503   RBC 2.99 (L) 11/11/2020 0500   HGB 8.9 (L) 11/11/2020 0500   HCT 28.6 (L) 11/11/2020 0500   PLT 94 (L) 11/11/2020 0500   MCV 95.7 11/11/2020 0500   MCH 29.8 11/11/2020 0500   MCHC 31.1 11/11/2020 0500   RDW 15.0 11/11/2020 0500   LYMPHSABS 1.9 11/09/2020 0921   MONOABS 0.5 11/09/2020 0921   EOSABS 0.2 11/09/2020 0921   BASOSABS 0.0 11/09/2020 0921   CMP Latest Ref Rng & Units 11/11/2020 11/09/2020 07/03/2020  Glucose 70 - 99 mg/dL 91 80 -  BUN 8 - 23 mg/dL 44(H) 23 -  Creatinine 0.44 - 1.00 mg/dL 6.68(H) 4.40(H) -  Sodium 135 - 145 mmol/L 136 139 -  Potassium 3.5 - 5.1 mmol/L 3.1(L) 4.1 -  Chloride 98 - 111 mmol/L 95(L) 98 -  CO2 22 - 32 mmol/L 27 27 -  Calcium 8.9 - 10.3 mg/dL 8.9 9.1 -  Total Protein 6.0 - 8.5 g/dL - - 6.4  Total Bilirubin 0.0 - 1.2 mg/dL - - 0.3  Alkaline Phos 44 - 121 IU/L - - 88  AST 0 - 40 IU/L - - 18  ALT 0 - 32 IU/L - - 11     Radiology Studies: No results found.   Scheduled Meds:  amiodarone  100 mg Oral QHS   calcitonin ([place])  1 spray Alternating Nares Daily   Chlorhexidine Gluconate Cloth  6 each Topical Q0600   cinacalcet  30 mg Oral Q breakfast   lidocaine-prilocaine  1 application Topical Once per day on Mon Wed Fri   midodrine  10 mg Oral TID WC   multivitamin  1 tablet Oral Weekly   sodium chloride flush  3 mL Intravenous Q12H   Continuous Infusions:   LOS: 2 days   Time spent: [place], MD [facility] To contact the attending provider between 7A-7P or the covering provider during after hours 7P-7A, please log into the web site [email or web address] and access using universal  password for that web site. If you do not have the password, please call the hospital operator.  11/12/2020, 4:12 PM

## 2020-11-12 NOTE — Progress Notes (Addendum)
  Progress Note    11/12/2020 7:50 AM 2 Days Post-Op  Subjective:  Left arm pain resolved. Says she dialyzed via left femoral TDC yesterday without comp   Vitals:   11/11/20 1939 11/12/20 0550  BP: (!) 107/35 122/60  Pulse: 64 91  Resp:    Temp: 98.5 F (36.9 C) 98.4 F (36.9 C)  SpO2: 99% 100%    Physical Exam: General appearance: awake, alert in NAD Left groin: catheter site without bleeding or erythema.  Respirations: unlabored; no dyspnea at rest Right upper extremity: Foam border dressing intact.  No bleeding or hematoma. Hand is warm.   CBC    Component Value Date/Time   WBC 7.9 11/11/2020 0500   RBC 2.53 (L) 11/12/2020 0503   RBC 2.99 (L) 11/11/2020 0500   HGB 8.9 (L) 11/11/2020 0500   HCT 28.6 (L) 11/11/2020 0500   PLT 94 (L) 11/11/2020 0500   MCV 95.7 11/11/2020 0500   MCH 29.8 11/11/2020 0500   MCHC 31.1 11/11/2020 0500   RDW 15.0 11/11/2020 0500   LYMPHSABS 1.9 11/09/2020 0921   MONOABS 0.5 11/09/2020 0921   EOSABS 0.2 11/09/2020 0921   BASOSABS 0.0 11/09/2020 0921    BMET    Component Value Date/Time   NA 136 11/11/2020 0633   K 3.1 (L) 11/11/2020 0633   CL 95 (L) 11/11/2020 0633   CO2 27 11/11/2020 0633   GLUCOSE 91 11/11/2020 0633   BUN 44 (H) 11/11/2020 0633   CREATININE 6.68 (H) 11/11/2020 0633   CALCIUM 8.9 11/11/2020 0633   GFRNONAA 5 (L) 11/11/2020 0633   GFRAA 25 (L) 02/28/2019 1559     Intake/Output Summary (Last 24 hours) at 11/12/2020 0750 Last data filed at 11/11/2020 1700 Gross per 24 hour  Intake 360 ml  Output -818 ml  Net 1178 ml    HOSPITAL MEDICATIONS Scheduled Meds:  amiodarone  100 mg Oral QHS   calcitonin ([place])  1 spray Alternating Nares Daily   Chlorhexidine Gluconate Cloth  6 each Topical Q0600   cinacalcet  30 mg Oral Q breakfast   lidocaine-prilocaine  1 application Topical Once per day on Mon Wed Fri   midodrine  10 mg Oral TID WC   multivitamin  1 tablet Oral Weekly   sodium chloride flush  3 mL  Intravenous Q12H   Continuous Infusions: PRN Meds:.acetaminophen **OR** acetaminophen, albuterol  Assessment and Plan:  POD 2 excision of right forearm AVG and placement of left groin TDC> no evidence of ongoing bleeding or hematoma. Will need sutres out in approximately 2 weeks. Will make outpt arrangements.   Risa Grill, PA-C [facility] [phone] 11/12/2020  7:50 AM   Agree with above incision healing catheter functioning  Will need follow-up for suture removal  Will sign off at this point.  Ruta Hinds, MD [facility] [place] Office: [phone]

## 2020-11-12 NOTE — Progress Notes (Signed)
[facility] Progress Note   Subjective:   Initial plan this morning was transitioning to comfort care measures, but now after taking with patient and family, she has decided that she would like to stick with dialysis a little longer. Still enjoys spending time with family and she is still pretty sharp mentally.   Objective Vitals:   11/11/20 1800 11/11/20 1939 11/12/20 0550 11/12/20 0952  BP: (!) 95/35 (!) 107/35 122/60 (!) 101/32  Pulse: 63 64 91 64  Resp: 18   17  Temp: 98 F (36.7 C) 98.5 F (36.9 C) 98.4 F (36.9 C) (!) 97.4 F (36.3 C)  TempSrc: Oral Oral Oral Oral  SpO2: 100% 99% 100% 96%  Weight:       Physical Exam General: Frail, elderly woman, NAD. Room air.  Heart: RRR; 3/6 murmur Lungs: CTA anteriorly Abdomen: soft Extremities: 1+ BLE edema; R forearm bandaged s/p AVG ligation Dialysis Access: New L femoral TDC  Additional Objective Labs: Basic Metabolic Panel: Recent Labs  Lab 11/09/20 0921 11/11/20 0633  NA 139 136  K 4.1 3.1*  CL 98 95*  CO2 27 27  GLUCOSE 80 91  BUN 23 44*  CREATININE 4.40* 6.68*  CALCIUM 9.1 8.9  PHOS  --  7.7*   Liver Function Tests: Recent Labs  Lab 11/11/20 0633  ALBUMIN 2.7*   CBC: Recent Labs  Lab 11/09/20 0921 11/09/20 1557 11/10/20 0327 11/11/20 0500  WBC 5.1  --  6.2 7.9  NEUTROABS 2.5  --   --   --   HGB 11.6* 11.3* 11.3* 8.9*  HCT 38.4 36.3 36.1 28.6*  MCV 100.8*  --  96.8 95.7  PLT 141*  --  99* 94*   CBG: Recent Labs  Lab 11/10/20 1656  GLUCAP 108*   Iron Studies:  Recent Labs    11/12/20 0503  IRON 26*  TIBC 134*  FERRITIN 1,306*   Studies/Results: DG Chest Port 1 View  Result Date: 11/10/2020 CLINICAL DATA:  Status post dialysis catheter insertion. EXAM: PORTABLE CHEST 1 VIEW COMPARISON:  February 26, 2019. FINDINGS: The heart size and mediastinal contours are within normal limits. Both lungs are clear. Interval placement of dialysis catheter from the inferior vena cava,  with the distal tip in expected position of the SVC. The visualized skeletal structures are unremarkable. IMPRESSION: Interval placement of dialysis catheter from the inferior vena cava, with distal tip in the expected position of the SVC. Aortic Atherosclerosis (ICD10-I70.0). Electronically Signed   By: Marijo Conception M.D.   On: 11/10/2020 15:56   DG Fluoro Guide CV Line Left  Result Date: 11/10/2020 CLINICAL DATA:  87 year old female with reported placement of a tunneled hemodialysis catheter in the operating room EXAM: CHEST FLUOROSCOPY TECHNIQUE: Real-time fluoroscopic evaluation of the chest was performed. FLUOROSCOPY TIME:  Fluoroscopy Time: Op note COMPARISON:  None. FINDINGS: Limited intraoperative fluoroscopic image of the chest. Partial opacification/visualization of the pulmonary arteries. No HD catheter visualized. IMPRESSION: Limited intraoperative fluoroscopic spot images, as above. Please refer to the dictated operative report for full details of intraoperative findings and procedure. Electronically Signed   By: Corrie Mckusick D.O.   On: 11/10/2020 15:21   Medications:   amiodarone  100 mg Oral QHS   calcitonin ([place])  1 spray Alternating Nares Daily   Chlorhexidine Gluconate Cloth  6 each Topical Q0600   cinacalcet  30 mg Oral Q breakfast   lidocaine-prilocaine  1 application Topical Once per day on Mon Wed Fri  midodrine  10 mg Oral TID WC   multivitamin  1 tablet Oral Weekly   sodium chloride flush  3 mL Intravenous Q12H    Dialysis Orders: TTS at [facility] 3:30hr, 350/500, EDW 46.5kg, 3K/2Ca, AVG, UFP #4, heparin 2300 bolus - Hectoral 32mcg IV q HD - no ESA   Assessment/Plan:  AVG hemorrhage: INR high on admit, still with bleeding despite reversal. S/p AVG   ligation and new L femoral TDC placement - will be cath dependent.  ESRD:  Usual TTS schedule, for HD today.  Hypotension/volume: BP low, on mido 10 TID. Used IV albumin last HD for BP support.  Anemia of ESRD: Hgb down  to 8.9 (post-op drop). Aranesp given 7/6.  Metabolic bone disease: Ca ok, Phos high. Continue home meds.  A-fib: Previously on warfarin - discontinued this admit, too high risk at age 19. Dispo: Was toying with stopping HD and transitioning to hospice, now has decided to stick with HD a little longer. Will work on getting palliative care involved as outpatient so support is they when or if she decides to transition. SW will place [facility] referral.      Veneta Penton, PA-C 11/12/2020, 12:43 PM  [facility]

## 2020-11-12 NOTE — Progress Notes (Signed)
[place] [phone] Manufacturing engineer [facility]) Hospital Liaison note:  This is a pending outpatient-based Palliative Care patient. Will continue to follow for disposition.  Please call with any outpatient palliative questions or concerns.  Thank you, Lorelee Market, LPN [facility] Liaison [phone]

## 2020-11-13 DIAGNOSIS — L8990 Pressure ulcer of unspecified site, unspecified stage: Secondary | ICD-10-CM | POA: Insufficient documentation

## 2020-11-13 DIAGNOSIS — T82838A Hemorrhage of vascular prosthetic devices, implants and grafts, initial encounter: Secondary | ICD-10-CM | POA: Diagnosis not present

## 2020-11-13 LAB — PROTIME-INR
INR: 1.7 — ABNORMAL HIGH (ref 0.8–1.2)
Prothrombin Time: 19.5 seconds — ABNORMAL HIGH (ref 11.4–15.2)

## 2020-11-13 NOTE — Care Management (Signed)
RN stated that son in room and patient stated she wants to stop dialysis now and trannsition to home hospice or [place] place. Messaged [facility], did not respond, so called on call, they will speak with patient and family shortly. [name]

## 2020-11-13 NOTE — Plan of Care (Signed)
  Problem: Education: Goal: Knowledge of General Education information will improve Description: Including pain rating scale, medication(s)/side effects and non-pharmacologic comfort measures Outcome: Progressing   Problem: Activity: Goal: Risk for activity intolerance will decrease Outcome: Progressing   Problem: Elimination: Goal: Will not experience complications related to bowel motility Outcome: Progressing   

## 2020-11-13 NOTE — Care Management (Signed)
[facility] called  

## 2020-11-13 NOTE — Discharge Summary (Signed)
Physician Discharge Summary  [name] ZSM:[id number] DOB: [birth year] DOA: 11/09/2020  PCP: Gayland Curry, DO  Admit date: 11/09/2020 Discharge date: 11/13/2020  Time spent: 27 minutes  Recommendations for Outpatient Follow-up:  Requires outpatient goals of care with [facility] Continue HD and follow-up with nephrology in the outpatient setting-does not have primary care physician Absolutely do not resume Coumadin on discharge given high risk of bleeding as per this admission  Discharge Diagnoses:  MAIN problem for hospitalization   Spontaneous bleeding from dialysis AV graft status postrepair  Please see below for itemized issues addressed in [facility]- refer to other progress notes for clarity if needed  Discharge Condition: Fair  Diet recommendation: Heart healthy  Filed Weights   11/11/20 0805 11/11/20 1144 11/12/20 1750  Weight: 48.6 kg 49.6 kg 45.9 kg    History of present illness:  87 year old community dwelling female Left 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  IgA myeloma [place]D [place]--access complications and issues since 05/28/2020 Rx balloon angioplasty Aortic stenosis from prior nonsurgical candidate), paroxysmal A. fib CHADS2 score >6 since 08/2016 (Coumadin) with TIA in the past HFpEF Anemia renal insufficiency Prior vasovagal syncope resulting in admission in 2018 Prior liver cysts noted in 2018   Admit 7/4 spontaneous bleeding dialysis AV graft-INR 4 7/4 vascular consulted 7/5 excision/ligation right forearm AV graft insertion left femoral tunneled dialysis cath  Hospital Course:  Spontaneous bleeding from right forearm AV graft status post ligation Dr. Oneida Alar of vascular Outpatient follow-up 2 weeks + suture removal-they have signed off ESRD [place]TS [place]/left nephrectomy 1970s Blood pressure is chronically low-continue midodrine 10 3 times daily- Currently has left femoral access and dialyzing through this Continue Sensipar 30 a.m., calcitonin  alternate nares Rest as per renal Paroxysmal A. fib CHADS2 score >6, has bled score >4 ? Prior TIA--unclear when Discussed with son Juanda Crumble implications of resumption of Coumadin shared decision making-discontinue Coumadin given short-term high risk of bleed balanced with only slight benefit of long-term complication prevention of stroke Continue amiodarone 100 at bedtime Mild thrombocytopenia History of IgA myeloma? Unlikely candidate for interventions for myelo--Aranesp given 7/6 Iron level 26, saturation ratios 19 however ferritin elevated so contraindicated to give IV iron at this time Acute blood loss anemia on admission As above-does not meet transfusion threshold Aortic stenosis-moderate based on 04/26/2017 echocardiogram and grade 1 diastolic dysfunction Follow-up with cardiology as needed-unlikely candidate for further interventions  Long discussions this hospital stay with son and family with regards to planning and de-escalation of HD--will continue to plan as OP with renal   Discharge Exam: Vitals:   11/13/20 0540 11/13/20 0926  BP: (!) 105/41 (!) 90/30  Pulse: 64 (!) 58  Resp: 16 19  Temp: 98.8 F (37.1 C) 97.9 F (36.6 C)  SpO2: 100% 98%    Subj on day of d/c   Awake coherent some RLE pain Otherwise coherent in nad no focal deficit  General Exam on discharge  Awake coherent in nad RUE has bandage Abd soft nt nd no rebound LLE has fisutla  No le edema Rom intact otherwise  Discharge Instructions    Allergies as of 11/13/2020       Reactions   Tape Other (See Comments)   Pulls off skin   Sulfa Antibiotics Other (See Comments)   Bumps all over body   Tolectin [tolmetin]         Medication List     STOP taking these medications    multivitamin Tabs tablet   warfarin  2.5 MG tablet Commonly known as: COUMADIN       TAKE these medications    amiodarone 200 MG tablet Commonly known as: PACERONE Take 100 mg by mouth at bedtime.    calcitonin (salmon) 200 UNIT/ACT nasal spray Commonly known as: MIACALCIN/FORTICAL Place 1 spray into alternate nostrils in the morning and at bedtime.   lidocaine-prilocaine cream Commonly known as: EMLA Apply 1 application topically 3 (three) times a week.   midodrine 10 MG tablet Commonly known as: PROAMATINE Take 10 mg by mouth daily.   Sensipar 30 MG tablet Generic drug: cinacalcet Take 30 mg by mouth daily.       Allergies  Allergen Reactions   Tape Other (See Comments)    Pulls off skin   Sulfa Antibiotics Other (See Comments)    Bumps all over body   Tolectin [Tolmetin]     Follow-up Information     Reed, Tiffany L, DO. Schedule an appointment as soon as possible for a visit .   Specialty: Geriatric Medicine Contact information: [place] [address] [phone]         Elam Dutch, MD Follow up.   Specialties: Vascular Surgery, Cardiology Why: office will call you to arrange your appt (sent) Contact information: [place] [address] [phone]                  The results of significant diagnostics from this hospitalization (including imaging, microbiology, ancillary and laboratory) are listed below for reference.    Significant Diagnostic Studies: DG Chest Port 1 View  Result Date: 11/10/2020 CLINICAL DATA:  Status post dialysis catheter insertion. EXAM: PORTABLE CHEST 1 VIEW COMPARISON:  February 26, 2019. FINDINGS: The heart size and mediastinal contours are within normal limits. Both lungs are clear. Interval placement of dialysis catheter from the inferior vena cava, with the distal tip in expected position of the SVC. The visualized skeletal structures are unremarkable. IMPRESSION: Interval placement of dialysis catheter from the inferior vena cava, with distal tip in the expected position of the SVC. Aortic Atherosclerosis (ICD10-I70.0). Electronically Signed   By: Marijo Conception M.D.   On: 11/10/2020  15:56   DG Fluoro Guide CV Line Left  Result Date: 11/10/2020 CLINICAL DATA:  87 year old female with reported placement of a tunneled hemodialysis catheter in the operating room EXAM: CHEST FLUOROSCOPY TECHNIQUE: Real-time fluoroscopic evaluation of the chest was performed. FLUOROSCOPY TIME:  Fluoroscopy Time: Op note COMPARISON:  None. FINDINGS: Limited intraoperative fluoroscopic image of the chest. Partial opacification/visualization of the pulmonary arteries. No HD catheter visualized. IMPRESSION: Limited intraoperative fluoroscopic spot images, as above. Please refer to the dictated operative report for full details of intraoperative findings and procedure. Electronically Signed   By: Corrie Mckusick D.O.   On: 11/10/2020 15:21    Microbiology: Recent Results (from the past 240 hour(s))  Resp Panel by RT-PCR (Flu A&B, Covid) Nasopharyngeal Swab     Status: None   Collection Time: 11/09/20  1:26 PM   Specimen: Nasopharyngeal Swab; Nasopharyngeal(NP) swabs in vial transport medium  Result Value Ref Range Status   SARS Coronavirus 2 by RT PCR NEGATIVE NEGATIVE Final    Comment: (NOTE) SARS-CoV-2 target nucleic acids are NOT DETECTED.  The SARS-CoV-2 RNA is generally detectable in upper respiratory specimens during the acute phase of infection. The lowest concentration of SARS-CoV-2 viral copies this assay can detect is 138 copies/mL. A negative result does not preclude SARS-Cov-2 infection and should not be used  as the sole basis for treatment or other patient management decisions. A negative result may occur with  improper specimen collection/handling, submission of specimen other than nasopharyngeal swab, presence of viral mutation(s) within the areas targeted by this assay, and inadequate number of viral copies(<138 copies/mL). A negative result must be combined with clinical observations, patient history, and epidemiological information. The expected result is Negative.  Fact Sheet  for Patients:  EntrepreneurPulse.com.au  Fact Sheet for Healthcare Providers:  IncredibleEmployment.be  This test is no t yet approved or cleared by the [place] FDA and  has been authorized for detection and/or diagnosis of SARS-CoV-2 by FDA under an Emergency Use Authorization (EUA). This EUA will remain  in effect (meaning this test can be used) for the duration of the COVID-19 declaration under Section 564(b)(1) of the Act, 21 U.S.C.section 360bbb-3(b)(1), unless the authorization is terminated  or revoked sooner.       Influenza A by PCR NEGATIVE NEGATIVE Final   Influenza B by PCR NEGATIVE NEGATIVE Final    Comment: (NOTE) The Xpert Xpress SARS-CoV-2/FLU/RSV plus assay is intended as an aid in the diagnosis of influenza from Nasopharyngeal swab specimens and should not be used as a sole basis for treatment. Nasal washings and aspirates are unacceptable for Xpert Xpress SARS-CoV-2/FLU/RSV testing.  Fact Sheet for Patients: EntrepreneurPulse.com.au  Fact Sheet for Healthcare Providers: IncredibleEmployment.be  This test is not yet approved or cleared by the [place] FDA and has been authorized for detection and/or diagnosis of SARS-CoV-2 by FDA under an Emergency Use Authorization (EUA). This EUA will remain in effect (meaning this test can be used) for the duration of the COVID-19 declaration under Section 564(b)(1) of the Act, 21 U.S.C. section 360bbb-3(b)(1), unless the authorization is terminated or revoked.  Performed at [facility], [place] [address] [place], [place] [address]   Surgical pcr screen     Status: None   Collection Time: 11/10/20 11:02 AM   Specimen: Nasal Mucosa; Nasal Swab  Result Value Ref Range Status   MRSA, PCR NEGATIVE NEGATIVE Final   Staphylococcus aureus NEGATIVE NEGATIVE Final    Comment: (NOTE) The Xpert SA Assay (FDA approved for NASAL specimens in  patients 52 years of age and older), is one component of a comprehensive surveillance program. It is not intended to diagnose infection nor to guide or monitor treatment. Performed at [facility], [place] [address], [place], [place] [address]      Labs: Basic Metabolic Panel: Recent Labs  Lab 11/09/20 0921 11/11/20 0633 11/12/20 1718  NA 139 136 135  K 4.1 3.1* 3.0*  CL 98 95* 97*  CO2 27 27 26   GLUCOSE 80 91 79  BUN 23 44* 29*  CREATININE 4.40* 6.68* 4.02*  CALCIUM 9.1 8.9 8.6*  PHOS  --  7.7* 5.5*   Liver Function Tests: Recent Labs  Lab 11/11/20 0633 11/12/20 1718  ALBUMIN 2.7* 2.9*   No results for input(s): LIPASE, AMYLASE in the last 168 hours. No results for input(s): AMMONIA in the last 168 hours. CBC: Recent Labs  Lab 11/09/20 0921 11/09/20 1557 11/10/20 0327 11/11/20 0500 11/12/20 1718  WBC 5.1  --  6.2 7.9 7.0  NEUTROABS 2.5  --   --   --   --   HGB 11.6* 11.3* 11.3* 8.9* 7.9*  HCT 38.4 36.3 36.1 28.6* 25.1*  MCV 100.8*  --  96.8 95.7 96.5  PLT 141*  --  99* 94* 114*   Cardiac Enzymes:  No results for input(s): CKTOTAL, CKMB, CKMBINDEX, TROPONINI in the last 168 hours. BNP: BNP (last 3 results) No results for input(s): BNP in the last 8760 hours.  ProBNP (last 3 results) No results for input(s): PROBNP in the last 8760 hours.  CBG: Recent Labs  Lab 11/10/20 1656  GLUCAP 108*       Signed:  Nita Sells MD   [facility] 11/13/2020, 9:35 AM

## 2020-11-13 NOTE — Progress Notes (Signed)
Manufacturing engineer [facility]) Hospital Liaison note.    Received request from [place] for family interest in [facility] [place] vs home with hospice. Chart and pt information under review by [facility] physician.  Hospice eligibility pending at this time.  [facility] is unable to offer a room today.  This RN made Dr. Verlon Au aware.  Discussed with son Juanda Crumble who verbalizes understanding.  Plan made for pt to discharged home as planned with [facility] to see on Sunday for an admission assessment visit.  Juanda Crumble denies DME needs at this time.   Please be sure the signed DNR transports home with the pt.  Please provide any prescriptions for comfort medications needed until pt can be admitted onto hospice services.  Contact numbers for [facility], including 24 hour on call nurse line, given to pt's son Juanda Crumble.  Please do not hesitate to call with questions.  Thank you for the opportunity to participate in this patient's care.  Domenic Moras, BSN, RN [facility] Liaison (listed on [place] under Hospice/[facility])    [phone] (24h on call)

## 2020-11-14 LAB — PROTIME-INR
INR: 1.6 — ABNORMAL HIGH (ref 0.8–1.2)
Prothrombin Time: 18.8 seconds — ABNORMAL HIGH (ref 11.4–15.2)

## 2020-11-14 NOTE — TOC Transition Note (Signed)
Transition of Care [facility]) - CM/SW Discharge Note   Patient Details  Name: [name] MRN: [id number] Date of Birth: 1921-08-14  Transition of Care [facility]) CM/SW Contact:  Bartholomew Crews, RN Phone Number: [phone] 11/14/2020, 8:57 AM   Clinical Narrative:     Notified of patient's readiness to transition home with hospice services through [facility]. [facility] has scheduled an admission visit for hospice at home for Sunday. Spoke with son, [name], to confirm plan for transition home. [name] is prepared to received patient home today. Confirmed home address for transport. Confirmed with nursing that signed DNR is on chart. [facility] notified for transport this morning. Medical transport paperwork on the chart. Nursing to call son when [name] arrives to advise that patient is on the way. No further TOC  needs identified.   Final next level of care: Home w Hospice Care Barriers to Discharge: No Barriers Identified   Patient Goals and CMS Choice Patient states their goals for this hospitalization and ongoing recovery are:: return home with hospice care CMS Medicare.gov Compare Post Acute Care list provided to:: Patient Represenative (must comment) [name], son) Choice offered to / list presented to : Adult Children [name])  Discharge Placement                       Discharge Plan and Services                DME Arranged: N/A DME Agency: NA         HH Agency: NA        Social Determinants of Health (SDOH) Interventions     Readmission Risk Interventions No flowsheet data found.

## 2020-11-14 NOTE — Progress Notes (Signed)
Seen  D/w son Ready for d/c--no changes to plan

## 2020-11-15 NOTE — TOC Transition Note (Signed)
Transition of care contact from inpatient facility  Date of discharge: 11/14/20 Date of contact: 11/15/20 Method: Phone Spoke to: [name] (son)  Patient contacted to discuss transition of care from recent inpatient hospitalization. Patient was admitted to [facility] from 11/09/20-11/14/20 with discharge diagnosis of spontaneous bleeding from dialysis AVG s/p repair.  I spoke to patient's son, [name]. Patient was transitioned to outpatient hospice with [facility]. She is a DNR. Patient had first home admission visit with [facility] today. Patient's son informed me that patient no longer wants to continue outpatient HD treatments. Currently, patient is on wait list for a room at [facility]. Spoke with attending Dr. Royce Macadamia. Outpatient HD center already aware of patient transitioning to hospice.  Patient's son reports no medication re-fills are needed. I advised son to reach out to [place] for any questions/concerns.  Tobie Poet, NP

## 2020-11-17 ENCOUNTER — Ambulatory Visit (INDEPENDENT_AMBULATORY_CARE_PROVIDER_SITE_OTHER): Payer: Medicare Other | Admitting: Cardiovascular Disease

## 2020-11-17 DIAGNOSIS — Z5181 Encounter for therapeutic drug level monitoring: Secondary | ICD-10-CM

## 2020-11-17 DIAGNOSIS — Z7901 Long term (current) use of anticoagulants: Secondary | ICD-10-CM

## 2020-11-17 DIAGNOSIS — I4891 Unspecified atrial fibrillation: Secondary | ICD-10-CM

## 2020-12-07 DEATH — deceased
